# Patient Record
Sex: Female | Born: 1965 | Race: White | Hispanic: No | Smoking: Former smoker
Health system: Southern US, Community
[De-identification: ages and names within clinical notes are randomized; demographics above are authoritative.]

## PROBLEM LIST (undated history)

## (undated) DIAGNOSIS — N200 Calculus of kidney: Secondary | ICD-10-CM

## (undated) DIAGNOSIS — L409 Psoriasis, unspecified: Secondary | ICD-10-CM

## (undated) DIAGNOSIS — H539 Unspecified visual disturbance: Secondary | ICD-10-CM

## (undated) DIAGNOSIS — J302 Other seasonal allergic rhinitis: Secondary | ICD-10-CM

## (undated) DIAGNOSIS — K469 Unspecified abdominal hernia without obstruction or gangrene: Secondary | ICD-10-CM

## (undated) DIAGNOSIS — O019 Hydatidiform mole, unspecified: Secondary | ICD-10-CM

## (undated) DIAGNOSIS — K224 Dyskinesia of esophagus: Secondary | ICD-10-CM

## (undated) DIAGNOSIS — D649 Anemia, unspecified: Secondary | ICD-10-CM

## (undated) DIAGNOSIS — Z9889 Other specified postprocedural states: Secondary | ICD-10-CM

## (undated) DIAGNOSIS — Z8719 Personal history of other diseases of the digestive system: Secondary | ICD-10-CM

## (undated) DIAGNOSIS — I10 Essential (primary) hypertension: Secondary | ICD-10-CM

## (undated) DIAGNOSIS — M1990 Unspecified osteoarthritis, unspecified site: Secondary | ICD-10-CM

## (undated) DIAGNOSIS — Z5189 Encounter for other specified aftercare: Secondary | ICD-10-CM

## (undated) DIAGNOSIS — IMO0001 Reserved for inherently not codable concepts without codable children: Secondary | ICD-10-CM

## (undated) DIAGNOSIS — G629 Polyneuropathy, unspecified: Secondary | ICD-10-CM

## (undated) DIAGNOSIS — K5909 Other constipation: Secondary | ICD-10-CM

## (undated) DIAGNOSIS — F329 Major depressive disorder, single episode, unspecified: Secondary | ICD-10-CM

## (undated) DIAGNOSIS — F410 Panic disorder [episodic paroxysmal anxiety] without agoraphobia: Secondary | ICD-10-CM

## (undated) DIAGNOSIS — S0990XA Unspecified injury of head, initial encounter: Secondary | ICD-10-CM

## (undated) DIAGNOSIS — Q249 Congenital malformation of heart, unspecified: Secondary | ICD-10-CM

## (undated) DIAGNOSIS — Z9289 Personal history of other medical treatment: Secondary | ICD-10-CM

## (undated) DIAGNOSIS — R05 Cough: Secondary | ICD-10-CM

## (undated) DIAGNOSIS — R112 Nausea with vomiting, unspecified: Secondary | ICD-10-CM

## (undated) DIAGNOSIS — E669 Obesity, unspecified: Secondary | ICD-10-CM

## (undated) DIAGNOSIS — K219 Gastro-esophageal reflux disease without esophagitis: Secondary | ICD-10-CM

## (undated) DIAGNOSIS — E785 Hyperlipidemia, unspecified: Secondary | ICD-10-CM

## (undated) HISTORY — DX: Congenital malformation of heart, unspecified: Q24.9

## (undated) HISTORY — DX: Other seasonal allergic rhinitis: J30.2

## (undated) HISTORY — DX: Other constipation: K59.09

## (undated) HISTORY — DX: Unspecified visual disturbance: H53.9

## (undated) HISTORY — DX: Obesity, unspecified: E66.9

## (undated) HISTORY — DX: Essential (primary) hypertension: I10

## (undated) HISTORY — PX: OTHER SURGICAL HISTORY: SHX169

## (undated) HISTORY — DX: Panic disorder (episodic paroxysmal anxiety): F41.0

## (undated) HISTORY — PX: PATENT DUCTUS ARTERIOUS REPAIR: SHX269

## (undated) HISTORY — DX: Hydatidiform mole, unspecified: O01.9

## (undated) HISTORY — PX: ELBOW FRACTURE SURGERY: SHX616

## (undated) HISTORY — DX: Gastro-esophageal reflux disease without esophagitis: K21.9

## (undated) HISTORY — DX: Unspecified abdominal hernia without obstruction or gangrene: K46.9

## (undated) HISTORY — DX: Calculus of kidney: N20.0

## (undated) HISTORY — DX: Encounter for other specified aftercare: Z51.89

## (undated) HISTORY — DX: Other specified postprocedural states: Z98.890

## (undated) HISTORY — DX: Unspecified injury of head, initial encounter: S09.90XA

## (undated) HISTORY — DX: Hyperlipidemia, unspecified: E78.5

## (undated) HISTORY — PX: SHOULDER ARTHROSCOPY: SHX128

## (undated) HISTORY — DX: Personal history of other diseases of the digestive system: Z87.19

---

## 2000-10-19 HISTORY — PX: NEPHRECTOMY: SHX65

## 2006-01-06 ENCOUNTER — Ambulatory Visit (HOSPITAL_COMMUNITY): Admission: RE | Admit: 2006-01-06 | Discharge: 2006-01-06 | Payer: Self-pay | Admitting: General Surgery

## 2006-01-12 ENCOUNTER — Encounter: Admission: RE | Admit: 2006-01-12 | Discharge: 2006-03-09 | Payer: Self-pay | Admitting: General Surgery

## 2006-02-16 HISTORY — PX: LAPAROSCOPIC GASTRIC BANDING: SHX1100

## 2006-03-01 ENCOUNTER — Encounter (INDEPENDENT_AMBULATORY_CARE_PROVIDER_SITE_OTHER): Payer: Self-pay | Admitting: *Deleted

## 2006-03-01 ENCOUNTER — Ambulatory Visit (HOSPITAL_COMMUNITY): Admission: RE | Admit: 2006-03-01 | Discharge: 2006-03-02 | Payer: Self-pay | Admitting: General Surgery

## 2006-04-13 ENCOUNTER — Encounter: Admission: RE | Admit: 2006-04-13 | Discharge: 2006-07-12 | Payer: Self-pay | Admitting: General Surgery

## 2006-08-02 ENCOUNTER — Encounter: Admission: RE | Admit: 2006-08-02 | Discharge: 2006-10-31 | Payer: Self-pay | Admitting: General Surgery

## 2007-02-12 ENCOUNTER — Emergency Department (HOSPITAL_COMMUNITY): Admission: EM | Admit: 2007-02-12 | Discharge: 2007-02-12 | Payer: Self-pay | Admitting: *Deleted

## 2007-06-04 ENCOUNTER — Emergency Department (HOSPITAL_COMMUNITY): Admission: EM | Admit: 2007-06-04 | Discharge: 2007-06-04 | Payer: Self-pay | Admitting: Emergency Medicine

## 2007-06-16 ENCOUNTER — Encounter: Admission: RE | Admit: 2007-06-16 | Discharge: 2007-06-16 | Payer: Self-pay | Admitting: General Surgery

## 2007-07-29 ENCOUNTER — Ambulatory Visit: Payer: Self-pay | Admitting: Pulmonary Disease

## 2007-08-12 ENCOUNTER — Ambulatory Visit: Admission: RE | Admit: 2007-08-12 | Discharge: 2007-08-12 | Payer: Self-pay | Admitting: Pulmonary Disease

## 2007-08-12 ENCOUNTER — Ambulatory Visit: Payer: Self-pay | Admitting: Pulmonary Disease

## 2007-08-21 ENCOUNTER — Ambulatory Visit: Payer: Self-pay | Admitting: Pulmonary Disease

## 2007-08-21 ENCOUNTER — Ambulatory Visit (HOSPITAL_BASED_OUTPATIENT_CLINIC_OR_DEPARTMENT_OTHER): Admission: RE | Admit: 2007-08-21 | Discharge: 2007-08-21 | Payer: Self-pay | Admitting: Pulmonary Disease

## 2007-08-25 ENCOUNTER — Ambulatory Visit: Payer: Self-pay | Admitting: Pulmonary Disease

## 2007-08-25 ENCOUNTER — Encounter: Admission: RE | Admit: 2007-08-25 | Discharge: 2007-08-25 | Payer: Self-pay | Admitting: General Surgery

## 2007-10-04 DIAGNOSIS — E1159 Type 2 diabetes mellitus with other circulatory complications: Secondary | ICD-10-CM | POA: Insufficient documentation

## 2007-10-04 DIAGNOSIS — E119 Type 2 diabetes mellitus without complications: Secondary | ICD-10-CM

## 2007-10-04 DIAGNOSIS — O019 Hydatidiform mole, unspecified: Secondary | ICD-10-CM | POA: Insufficient documentation

## 2007-10-04 DIAGNOSIS — I10 Essential (primary) hypertension: Secondary | ICD-10-CM

## 2007-10-04 DIAGNOSIS — E11621 Type 2 diabetes mellitus with foot ulcer: Secondary | ICD-10-CM | POA: Insufficient documentation

## 2007-10-04 DIAGNOSIS — R059 Cough, unspecified: Secondary | ICD-10-CM | POA: Insufficient documentation

## 2007-10-04 DIAGNOSIS — Z905 Acquired absence of kidney: Secondary | ICD-10-CM | POA: Insufficient documentation

## 2007-10-04 DIAGNOSIS — R05 Cough: Secondary | ICD-10-CM

## 2007-10-04 DIAGNOSIS — I152 Hypertension secondary to endocrine disorders: Secondary | ICD-10-CM | POA: Insufficient documentation

## 2007-10-04 HISTORY — DX: Type 2 diabetes mellitus with foot ulcer: E11.621

## 2007-10-31 ENCOUNTER — Ambulatory Visit: Payer: Self-pay | Admitting: Pulmonary Disease

## 2007-10-31 DIAGNOSIS — E669 Obesity, unspecified: Secondary | ICD-10-CM | POA: Insufficient documentation

## 2007-10-31 DIAGNOSIS — K219 Gastro-esophageal reflux disease without esophagitis: Secondary | ICD-10-CM | POA: Insufficient documentation

## 2007-10-31 DIAGNOSIS — J45991 Cough variant asthma: Secondary | ICD-10-CM | POA: Insufficient documentation

## 2007-10-31 DIAGNOSIS — G4733 Obstructive sleep apnea (adult) (pediatric): Secondary | ICD-10-CM | POA: Insufficient documentation

## 2007-10-31 DIAGNOSIS — J310 Chronic rhinitis: Secondary | ICD-10-CM | POA: Insufficient documentation

## 2007-10-31 HISTORY — DX: Obstructive sleep apnea (adult) (pediatric): G47.33

## 2007-12-15 ENCOUNTER — Encounter: Admission: RE | Admit: 2007-12-15 | Discharge: 2007-12-15 | Payer: Self-pay | Admitting: Obstetrics and Gynecology

## 2008-01-25 ENCOUNTER — Emergency Department (HOSPITAL_COMMUNITY): Admission: EM | Admit: 2008-01-25 | Discharge: 2008-01-25 | Payer: Self-pay | Admitting: Emergency Medicine

## 2008-03-19 ENCOUNTER — Encounter: Admission: RE | Admit: 2008-03-19 | Discharge: 2008-03-19 | Payer: Self-pay | Admitting: General Surgery

## 2008-03-30 ENCOUNTER — Inpatient Hospital Stay (HOSPITAL_COMMUNITY): Admission: AD | Admit: 2008-03-30 | Discharge: 2008-04-02 | Payer: Self-pay | Admitting: Internal Medicine

## 2009-05-29 ENCOUNTER — Encounter: Admission: RE | Admit: 2009-05-29 | Discharge: 2009-05-29 | Payer: Self-pay | Admitting: Internal Medicine

## 2009-12-26 ENCOUNTER — Encounter: Admission: RE | Admit: 2009-12-26 | Discharge: 2009-12-26 | Payer: Self-pay | Admitting: Internal Medicine

## 2010-03-23 ENCOUNTER — Ambulatory Visit: Payer: Self-pay | Admitting: Family Medicine

## 2010-03-23 DIAGNOSIS — K5909 Other constipation: Secondary | ICD-10-CM | POA: Insufficient documentation

## 2010-06-18 ENCOUNTER — Encounter: Admission: RE | Admit: 2010-06-18 | Discharge: 2010-06-18 | Payer: Self-pay | Admitting: Internal Medicine

## 2010-09-12 ENCOUNTER — Encounter: Payer: Self-pay | Admitting: Pulmonary Disease

## 2010-09-18 ENCOUNTER — Encounter: Payer: Self-pay | Admitting: Pulmonary Disease

## 2010-09-22 ENCOUNTER — Encounter: Payer: Self-pay | Admitting: Pulmonary Disease

## 2010-09-26 ENCOUNTER — Encounter: Payer: Self-pay | Admitting: Pulmonary Disease

## 2010-09-29 ENCOUNTER — Encounter: Payer: Self-pay | Admitting: Pulmonary Disease

## 2010-10-02 ENCOUNTER — Encounter: Payer: Self-pay | Admitting: Pulmonary Disease

## 2010-10-23 ENCOUNTER — Telehealth: Payer: Self-pay | Admitting: Internal Medicine

## 2010-10-23 ENCOUNTER — Ambulatory Visit
Admission: RE | Admit: 2010-10-23 | Discharge: 2010-10-23 | Payer: Self-pay | Source: Home / Self Care | Attending: Pulmonary Disease | Admitting: Pulmonary Disease

## 2010-10-23 ENCOUNTER — Encounter: Payer: Self-pay | Admitting: Pulmonary Disease

## 2010-10-23 ENCOUNTER — Other Ambulatory Visit: Payer: Self-pay | Admitting: Pulmonary Disease

## 2010-10-23 DIAGNOSIS — I712 Thoracic aortic aneurysm, without rupture, unspecified: Secondary | ICD-10-CM | POA: Insufficient documentation

## 2010-10-23 LAB — CBC WITH DIFFERENTIAL/PLATELET
Basophils Absolute: 0 10*3/uL (ref 0.0–0.1)
Basophils Relative: 0.4 % (ref 0.0–3.0)
Eosinophils Absolute: 0.1 10*3/uL (ref 0.0–0.7)
Eosinophils Relative: 0.7 % (ref 0.0–5.0)
HCT: 34.5 % — ABNORMAL LOW (ref 36.0–46.0)
Hemoglobin: 11.7 g/dL — ABNORMAL LOW (ref 12.0–15.0)
Lymphocytes Relative: 29.6 % (ref 12.0–46.0)
Lymphs Abs: 2 10*3/uL (ref 0.7–4.0)
MCHC: 33.9 g/dL (ref 30.0–36.0)
MCV: 81.1 fl (ref 78.0–100.0)
Monocytes Absolute: 0.4 10*3/uL (ref 0.1–1.0)
Monocytes Relative: 5.6 % (ref 3.0–12.0)
Neutro Abs: 4.4 10*3/uL (ref 1.4–7.7)
Neutrophils Relative %: 63.7 % (ref 43.0–77.0)
Platelets: 322 10*3/uL (ref 150.0–400.0)
RBC: 4.26 Mil/uL (ref 3.87–5.11)
RDW: 15.3 % — ABNORMAL HIGH (ref 11.5–14.6)
WBC: 6.8 10*3/uL (ref 4.5–10.5)

## 2010-10-23 LAB — HEPATIC FUNCTION PANEL
ALT: 18 U/L (ref 0–35)
AST: 24 U/L (ref 0–37)
Albumin: 3.7 g/dL (ref 3.5–5.2)
Alkaline Phosphatase: 76 U/L (ref 39–117)
Bilirubin, Direct: 0.1 mg/dL (ref 0.0–0.3)
Total Bilirubin: 0.5 mg/dL (ref 0.3–1.2)
Total Protein: 7.1 g/dL (ref 6.0–8.3)

## 2010-10-23 LAB — BASIC METABOLIC PANEL
BUN: 13 mg/dL (ref 6–23)
CO2: 30 mEq/L (ref 19–32)
Calcium: 9.2 mg/dL (ref 8.4–10.5)
Chloride: 100 mEq/L (ref 96–112)
Creatinine, Ser: 0.8 mg/dL (ref 0.4–1.2)
GFR: 85.68 mL/min (ref >60.00)
Glucose, Bld: 89 mg/dL (ref 70–99)
Potassium: 3.8 mEq/L (ref 3.5–5.1)
Sodium: 138 mEq/L (ref 135–145)

## 2010-10-23 LAB — CONVERTED CEMR LAB
A-1 Antitrypsin, Ser: 175 mg/dL (ref 83–200)
IgE (Immunoglobulin E), Serum: <1.5 intl units/mL (ref 0.0–180.0)

## 2010-10-23 LAB — SEDIMENTATION RATE: Sed Rate: 29 mm/hr — ABNORMAL HIGH (ref 0–22)

## 2010-10-23 LAB — TSH: TSH: 1 u[IU]/mL (ref 0.35–5.50)

## 2010-10-23 LAB — BRAIN NATRIURETIC PEPTIDE: Pro B Natriuretic peptide (BNP): 9.9 pg/mL (ref 0.0–100.0)

## 2010-10-24 ENCOUNTER — Ambulatory Visit: Payer: Self-pay | Admitting: Internal Medicine

## 2010-10-24 ENCOUNTER — Telehealth (INDEPENDENT_AMBULATORY_CARE_PROVIDER_SITE_OTHER): Payer: Self-pay | Admitting: *Deleted

## 2010-11-03 ENCOUNTER — Ambulatory Visit: Admit: 2010-11-03 | Payer: Self-pay | Admitting: Pulmonary Disease

## 2010-11-07 ENCOUNTER — Telehealth (INDEPENDENT_AMBULATORY_CARE_PROVIDER_SITE_OTHER): Payer: Self-pay | Admitting: *Deleted

## 2010-11-07 ENCOUNTER — Encounter: Payer: Self-pay | Admitting: Pulmonary Disease

## 2010-11-20 NOTE — Letter (Signed)
Summary: [facility]  [address]   [place], [place] [address]   Phone: [phone]  Fax: [phone]      11/07/2010  RE:  NOHEA KRAS, Claim # [id number]   TO WHOM IT MAY CONCERN:  Please excuse the above named patient from work on 10/24/2010 due to scheduled testing requested by Dr. Craige Cotta.  If you have any questions, please call the office at [phone].    Sincerely,     Coralyn Helling, M.D.

## 2010-11-20 NOTE — Progress Notes (Signed)
Summary: CT -- VS pt  Phone Note Other Incoming   Caller: Rose CT  Summary of Call: Rose with CT calling.  Requesting dr look at pt's CT and give instructions on what to do.  Advised VS is out of the office but will have doc of the day look at this and will call her back with his recs.  Rose call back- ext [phone]  Dr. Delford Field, pls advise.  Thanks! Initial call taken by: Gweneth Dimitri RN,  October 24, 2010 2:51 PM  Follow-up for Phone Call        Per Dr. Delford Field, rose may let pt go.   Rose is aware of this.  Gweneth Dimitri RN  October 24, 2010 2:53 PM   Additional Follow-up for Phone Call Additional follow up Details #1::        yes let sood review as outpt Additional Follow-up by: Storm Frisk MD,  October 24, 2010 2:54 PM    Additional Follow-up for Phone Call Additional follow up Details #2::    CT reviewed.  Willl contact patient. Follow-up by: Coralyn Helling MD,  October 24, 2010 3:01 PM

## 2010-11-20 NOTE — Letter (Signed)
Summary: [facility]  [facility]   Imported By: Sherian Rein 11/12/2010 10:10:38  _____________________________________________________________________  External Attachment:    Type:   Image     Comment:   External Document

## 2010-11-20 NOTE — Assessment & Plan Note (Signed)
Summary: BRONCHITIS/EVM   Vital Signs:  Patient Profile:   45 Years Old Female CC:      ? bronchitis / uri Height:     66 inches Weight:      229 pounds BMI:     37.10 BSA:     2.12 O2 Sat:      98 % O2 treatment:    Room Air Temp:     98.6 degrees F oral Pulse rate:   92 / minute Pulse rhythm:   regular Resp:     20 per minute BP sitting:   160 / 94  (right arm) Cuff size:   large  Pt. in pain?   no  Vitals Entered By: Providence Crosby LPN (March 23, 1190 11:35 AM)              Is Patient Diabetic? Yes  Nutritional Status BMI of > 30 = obese Comments at 12 noon gave patient .1mg  of clonidine by mouth //at 12:30 bp 156/76 pulse 96 wanda combs lpn YN82:95 pm bp 146/70 pulse 96 skin warm and dry. wanda combs lpn     Serial Vital Signs/Assessments:  Time      Position  BP       Pulse  Resp  Temp     By 1230 p              160/90   92                    Wanda Combs LPN 6213 P              147/76   90     15    98.2  F  Tiwanda Threats MD 1135                160/90                         Tymia Streb MD                                PEF    PreRx  PostRx Time      O2 Sat  O2 Type     L/min  L/min  L/min   By 1245 P    99  %   Room air                          Todd Argabright MD this blood pressure was done at 11:33 when she first came in' Wanda combs lpn    Current Allergies (reviewed today): ! PCN ! SULFAHistory of Present Illness Referral source: self History from: patient Reason for visit: see chief complaint Chief Complaint: ?bronchitis per patient History of Present Illness: started out as cold symptoms about a week to 10 days ago, pt starting with wheezing in the last 4 days, productive cough, pt didn't take blood pressure meds this am.  Pt says she has chronic constipation.  With the new illness, pt reports that she has had constant cough, occ sob, allergy symptoms, no CP, no fever, no rash, pt had been drinking a lot of fluids at her home with her son but she  reports that she has not been able to stop the coughing.   Pt reports that she otherwise has been well but somewhat worried about the cough, headaches  and wheezing. She says that she tends to get these bronchitis infections every spring/summer season.   Current Meds MULTIVITAMINS   TABS (MULTIPLE VITAMIN) 1 by mouth daily VIACTIV 500-100-40  CHEW (CALCIUM-VITAMIN D-VITAMIN K) Take 1 tablet by mouth once a day SYMBICORT 160-4.5 MCG/ACT  AERO (BUDESONIDE-FORMOTEROL FUMARATE) 2 puffs two times a day VERAPAMIL HCL CR 240 MG  TBCR (VERAPAMIL HCL) Take 1 tablet by mouth once a day SINGULAIR 10 MG  TABS (MONTELUKAST SODIUM) Take 1 tablet by mouth once a day AMITRIPTYLINE HCL 25 MG  TABS (AMITRIPTYLINE HCL) Take 1 tablet by mouth once a day LEVSIN/SL 0.125 MG SUBL (HYOSCYAMINE SULFATE) as needed subliquinal MIRALAX  POWD (POLYETHYLENE GLYCOL 3350) 1 cap every day by mouth DEXILANT 60 MG CPDR (DEXLANSOPRAZOLE) 1 daily by mouth DOXYCYCLINE HYCLATE 100 MG CAPS (DOXYCYCLINE HYCLATE) take 1 by mouth two times a day until completed VENTOLIN HFA 108 (90 BASE) MCG/ACT AERS (ALBUTEROL SULFATE) 2 puffs q 3 hours as needed wheezing, sob, severe cough  REVIEW OF SYSTEMS Constitutional Symptoms       Complains of fatigue.     Denies fever, chills, weight loss, and weight gain.  Eyes       Complains of eye pain and glasses.      Denies change in vision, eye discharge, contact lenses, and eye surgery. Ear/Nose/Throat/Mouth       Complains of dizziness, frequent runny nose, frequent nose bleeds, sinus problems, sore throat, and hoarseness.      Denies hearing loss/aids, change in hearing, ear pain, ear discharge, and tooth pain or bleeding.  Respiratory       Complains of dry cough, productive cough, wheezing, shortness of breath, asthma, and bronchitis.      Comments: c/o yellow phlegm Cardiovascular       Denies murmurs, chest pain, and tires easily with exhertion.    Gastrointestinal       Complains of  stomach pain and constipation.      Denies nausea/vomiting, diarrhea, blood in bowel movements, and indigestion.      Comments: has hernia Genitourniary       Denies painful urination, blood or discharge from vagina, kidney stones, and loss of urinary control. Neurological       Complains of headaches.      Denies loss of or changes in sensation, numbness, tngling, paralysis, seizures, and fainting/blackouts. Musculoskeletal       Denies muscle pain, joint pain, joint stiffness, decreased range of motion, redness, swelling, muscle weakness, and gout.  Skin       Denies bruising, unusual mles/lumps or sores, and hair/skin or nail changes.  Psych       Denies mood changes, temper/anger issues, anxiety/stress, speech problems, depression, and sleep problems.  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 Constipation- Chronic Nephrolithiasis s/p Lap Band procedure for Obesity 2007  Family History: Father: age 44 elevated blood pressure Mother: 2 years of age  diabetes high blood pressure  Siblings: 1 brother deceased at  age 68 1 sister age 23 elevated bp; heart disease  Social History: Marital Status: Married/ getting divorce Children: 3 / 1girl and 2 boys Occupation: [employer] open heart surgery in 1967 left kidney removed 2002 Physical Exam General appearance: well developed, well nourished, no acute distress Head: normocephalic,  atraumatic Eyes: conjunctivae and lids normal Pupils: equal, round, reactive to light Ears: normal, no lesions or deformities Nasal: mucosa pink, nonedematous, no septal deviation, turbinates normal Oral/Pharynx: tongue normal, posterior pharynx without erythema or exudate Neck: neck supple,  trachea midline, no masses Chest/Lungs: scattered wheezes throughout all lobes, rare rhonchi heard at both bases, good improvement after 2 neb treatments, wheezing nearly completely resolved Heart: regular rate and  rhythm, no murmur Abdomen: soft, non-tender without obvious organomegaly Extremities: normal extremities except no  Neurological: grossly intact and non-focal Skin: no obvious rashes or lesions MSE: oriented to time, place, and person Assessment Problems:   GERD (ICD-530.81) OBESITY, UNSPECIFIED (ICD-278.00) OBSTRUCTIVE SLEEP APNEA (ICD-327.23) CHRONIC RHINITIS (ICD-472.0) 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 The patient reports that she is doing better with this. She reports that on a regular basis she has difficulty with changes of the season that require her to be treated for bronchitis. She said that she had walking pneumonia with her last bronchitis illness.  Patient Education: Patient and/or caregiver instructed in the following: rest, fluids, exercise, weight loss. the patient was given  instructions to check with her primary care provider regarding a nebulizer machine for home use. Demonstrates willingness to comply.  Plan New Medications/Changes: VENTOLIN HFA 108 (90 BASE) MCG/ACT AERS (ALBUTEROL SULFATE) 2 puffs q 3 hours as needed wheezing, sob, severe cough  #1 x 0, 03/23/2010, Issaih Kaus MD DOXYCYCLINE HYCLATE 100 MG CAPS (DOXYCYCLINE HYCLATE) take 1 by mouth two times a day until completed  #14 x 0, 03/23/2010, Standley Dakins MD  New Orders: Pulse Oximetry [94760] Nebulizer Tx [id number] New Patient Level IV [99204] Solumedrol up to 125mg  [J2930] Prescription Created Electronically [phone] Follow Up: Follow up in 1-2 days if no improvement, Follow up with Primary Physician Follow Up: with primary care physician Dr. Wylene Simmer  The patient and/or caregiver has been counseled thoroughly with regard to medications prescribed including dosage, schedule, interactions, rationale for use, and possible side effects and they verbalize understanding.  Diagnoses and expected course of recovery discussed and will return if not improved as expected or if the condition worsens. Patient and/or caregiver verbalized understanding.  Prescriptions: VENTOLIN HFA 108 (90 BASE) MCG/ACT AERS (ALBUTEROL SULFATE) 2 puffs q 3 hours as needed wheezing, sob, severe cough  #1 x 0   Entered and Authorized by:   Standley Dakins MD   Signed by:   Standley Dakins MD on 03/23/2010   Method used:   [facility] to        [facility]  [address] (retail)       [address]       [place], [place]  [address]       Ph: [phone] or [phone]       Fax: [phone]   RxID:  [id number] DOXYCYCLINE HYCLATE 100 MG CAPS (DOXYCYCLINE HYCLATE) take 1 by mouth two times a day until completed  #14 x 0   Entered and Authorized by:   Standley Dakins MD   Signed by:   Standley Dakins MD on 03/23/2010   Method used:   [facility]        [facility]  [address]  (retail)       [address]       [place], [place]  [address]       Ph: [phone] or [phone]       Fax: [phone]   RxID:   [id number]   Patient Instructions: 1)  Follow up with your primary care physician about getting a home nebulizer.  2)  Go home and take your blood pressure medications. 3)  Check your blood pressure often and go see your primary care physician to have your blood pressure checked in the next week.  4)  The patient was informed that there is no on-call provider or services available at this clinic during off-hours (when the clinic is closed).  If the patient developed a problem or concern that required immediate attention, the patient was advised to go the the nearest available urgent care or emergency department for medical care.  The patient verbalized understanding.    5)  The patient's prescriptions were checked for possible interactions and electronically sent to the pharmacy of choice.   6)  The risks, benefits and possible side effects were clearly explained and discussed with the patient.  The patient verbalized clear understanding.  The patient was given instructions to return if symptoms don't improve, worsen or new changes develop.  If it is not during clinic hours and the patient cannot get back to this clinic then the patient was told to seek medical care at an available urgent care or emergency department.  The patient verbalized understanding.    Medication Administration  Injection # 1:    Medication: Solumedrol up to 125mg     Diagnosis: BRONCHITIS, ACUTE WITH BRONCHOSPASM (ICD-466.0)    Route: IM    Site: RUOQ gluteus    Exp Date: 10/2012    Lot #: [id number]    Mfr: pfizer    Given by: Providence Crosby LPN (March 23, 9562 12:44 PM)  Orders Added: 1)  Pulse Oximetry [94760] 2)  Nebulizer Tx [94640] 3)  New Patient Level IV [99204] 4)  Solumedrol up to 125mg  [J2930] 5)  Prescription Created Electronically [id number]   Medication  Administration  Injection # 1:    Medication: Solumedrol up to 125mg     Diagnosis: BRONCHITIS, ACUTE WITH BRONCHOSPASM (ICD-466.0)    Route: IM    Site: RUOQ gluteus    Exp Date: 10/2012    Lot #: [id number]    Mfr: pfizer    Given by: Providence Crosby LPN (March 23, 3328 12:44 PM)  Orders Added: 1)  Pulse Oximetry [94760] 2)  Nebulizer Tx [94640] 3)  New Patient Level IV [99204] 4)  Solumedrol up to 125mg  [J2930] 5)  Prescription Created Electronically [id number]    Vital Signs:  Patient Profile:   45 Years Old Female CC:      ?bronchitis per patient Height:     66 inches Weight:      229 pounds BMI:     37.10 BSA:     2.12 O2 Sat:      98 % Temp:     98.6 degrees F oral Pulse rate:  92 / minute Pulse rhythm:   regular Resp:     20 per minute BP sitting:   160 / 94 Cuff size:   large  Vitals Entered By: Providence Crosby LPN (March 23, 6212 12:46 PM)  The patient was informed that there is no on-call provider or services available at this clinic during off-hours (when the clinic is closed).  If the patient developed a problem or concern that required immediate attention, the patient was advised to go the the nearest available urgent care or emergency department for medical care.  The patient verbalized understanding.     It was clearly explained to the patient that this [facility] is not intended to be a primary care clinic.  The patient is always better served by the continuity of care and the provider/patient relationships developed with their dedicated primary care provider.  The patient was told to be sure to follow up as soon as possible with their primary care provider to discuss treatments received and to receive further examination and testing.  The patient verbalized understanding. The will f/u with PCP ASAP.   The patient's prescriptions were checked for possible interactions and electronically sent to the pharmacy of choice.    The risks, benefits and possible side effects  were clearly explained and discussed with the patient.  The patient verbalized clear understanding.  The patient was given instructions to return if symptoms don't improve, worsen or new changes develop.  If it is not during clinic hours and the patient cannot get back to this clinic then the patient was told to seek medical care at an available urgent care or emergency department.  The patient verbalized understanding.

## 2010-11-20 NOTE — Progress Notes (Signed)
Summary: out of work note  Phone Note Call from Patient Call back at [employer] [phone]   Caller: Patient Call For: sood Reason for Call: Talk to Nurse Summary of Call: Patient saw Dr. Craige Cotta 10/23/10 and was scheduled for a CT 10/24/10.  FMLA is denying CT on 10/24/10, so patient needs a letter stating that the CT was needed.  Please fax letter to [phone] attn:Patricia Learned  Claim# [id number] Initial call taken by: Lehman Prom,  November 07, 2010 11:46 AM  Follow-up for Phone Call        [facility] Vernie Murders  November 07, 2010 2:37 PM Patient phoned returning Humphrey call she gets off at 4:30 and will try to call back then. Vedia Coffer  November 07, 2010 3:30 PM  Letter faxed for patient to number provided.  Pt is aware.           Michel Bickers CMA  November 07, 2010 4:42 PM

## 2010-11-20 NOTE — Assessment & Plan Note (Signed)
Summary: wheezing///kp   Copy to:  Dr. Guerry Bruin Primary Provider/Referring Provider:  Dr. Colonel Bald  CC:  Asthma evaluation...patient c/o incr SOB and wheezing and dry cough since November 2011.Dominique Evans  History of Present Illness: 45 yo female for evaluation of cough.  I had previously seen [name] in 2008 and 2009 for her cough.  At that time this was felt to be related to rhinitis with post-nasal drip, asthma, and reflux.  She does have an extensive prior history of smoking, but PFT from 2008 did not show evidence for airflow obstruction.  She has since been followed by Dr. Wylene Simmer and Dr. Willa Rough.  She has suffered from asthma since she was very young.  She has been told by her allergist that she has several allergies.  She has been noticing more trouble catching her breath, and she has been wheezing more.  Her wheezing is coming mostly from her chest, but some in her throat.  She has been getting a tight feeling in her chest.  She gets hoarse, and feels like something is stuck in her throat.  Her cough is usually dry.  She denies hemoptysis.  She has been using singulair for years.  She was using advair before, but this stopped working.  She tried symbicort, but this caused chest discomfort.  She was switched to Qvar, and this seems to help some.  She has nose sprays, but does not use these on a regular basis.  She developed a respiratory infection in November.  She was given an antibiotic and prednisone.  Her symptoms persisted.  She was given a depmedrol shot, and nebulizer treatment.  She was then given an additional course of antibiotics and prednisone.  She had a third round of prednisone after this, and finally felt like she was getting some improvement.  She then went back to work, and her symptoms recurred.  She has been using dexliant for GERD.  She notices trouble with her breathing when she lays flat.  She feels like her throat is closing.  As a result she has to sleep in  a chair.  Chest xray report from Sep 12, 2010 was unremarkable.  Preventive Screening-Counseling & Management  Alcohol-Tobacco     Alcohol drinks/day: 0     Smoking Status: quit     Packs/Day: 1.0     Year Started: 1982     Year Quit: 1984     Pack years: 2  Current Medications (verified): 1)  Qvar 80 Mcg/act Aers (Beclomethasone Dipropionate) .... 2 By Mouth Once Daily 2)  Verapamil Hcl Cr 240 Mg  Tbcr (Verapamil Hcl) .... Take 1 Tablet By Mouth Once A Day 3)  Singulair 10 Mg  Tabs (Montelukast Sodium) .... Take 1 Tablet By Mouth Once A Day 4)  Amitriptyline Hcl 25 Mg  Tabs (Amitriptyline Hcl) .... Take 1 Tablet By Mouth Once A Day 5)  Miralax  Powd (Polyethylene Glycol 3350) .Dominique Evans.. 1 Cap Every Day By Mouth 6)  Dexilant 60 Mg Cpdr (Dexlansoprazole) .Dominique Evans.. 1 Daily By Mouth 7)  Pristiq 50 Mg Xr24h-Tab (Desvenlafaxine Succinate) .Dominique Evans.. 1 By Mouth Daily 8)  Crestor 10 Mg Tabs (Rosuvastatin Calcium) .Dominique Evans.. 1 By Mouth Daily 9)  Furosemide 20 Mg Tabs (Furosemide) .Dominique Evans.. 1 By Mouth Two Times A Day 10)  Proair Hfa 108 (90 Base) Mcg/act Aers (Albuterol Sulfate) .... 2 Puffs As Needed  Allergies (verified): 1)  ! Pcn 2)  ! 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  Past Surgical History: left nephrectomy in 2002 Left elbow fracture. Arthroscopic surgery on her right shoulder. Gastric band placement with truncal vagotomy in May of 2007.  Family History: Father: age 86 elevated blood pressure Mother: 61 years of age  diabetes high blood pressure  Siblings: 1 brother deceased at age 65 1 sister age 69  elevated bp; heart disease Family History Breast Cancer---aunt skin cancer---mother and father  Social History: Getting divorced.  Four children.  Works with [employer].  Smoked 2 packs per day, and quit in 1984.Alcohol drinks/day:  0 Packs/Day:  1.0 Pack years:  2  Review of Systems       The patient complains of shortness of breath with activity, shortness of breath at rest, non-productive cough, chest pain, acid heartburn, indigestion, loss of appetite, weight change, difficulty swallowing, headaches, depression, hand/feet swelling, joint stiffness or pain, and rash.  The patient denies productive cough, coughing up blood, irregular heartbeats, abdominal pain, sore throat, tooth/dental problems, nasal congestion/difficulty breathing through nose, sneezing, itching, ear ache, anxiety, change in color of mucus, and fever.    Vital Signs:  Patient profile:   45 year old female Height:      66 inches (167.64 cm) Weight:      243.38 pounds (110.63 kg) BMI:     39.42 O2 Sat:      99 % on Room air Temp:     98.2 degrees F (36.78 degrees C) oral Pulse rate:   94 / minute BP sitting:   142 / 84  (left arm) Cuff size:   large  Vitals Entered By: Michel Bickers CMA (October 23, 2010 2:44 PM)  O2 Sat at Rest %:  99 O2 Flow:  Room air CC: Asthma evaluation...patient c/o incr SOB, wheezing and dry cough since November 2011. Comments Medications reviewed with patient Michel Bickers CMA  October 23, 2010 2:45 PM   Physical Exam  General:  normal appearance, healthy appearing, and obese.   Eyes:  PERRLA and EOMI.   Ears:  TMs intact and clear with normal canals Nose:  narrow nasal angles, clear drainage, no sinus tenderness Mouth:  MP 2, no exudate Neck:  no JVD, wheeze over throat Chest Wall:  no deformities noted Lungs:  clear bilaterally to auscultation and percussion Heart:  regular rate and rhythm, S1, S2 without murmurs, rubs, gallops, or clicks Abdomen:  bowel sounds positive; abdomen soft  and non-tender without masses, or organomegaly Msk:  no deformity or scoliosis noted with normal posture Pulses:  pulses normal Extremities:  no clubbing, cyanosis, edema, or deformity noted Neurologic:  CN II-XII grossly intact with normal reflexes, coordination, muscle strength and tone Skin:  changes of psoriasis Cervical Nodes:  no significant adenopathy Psych:  alert and cooperative; normal mood and affect; normal attention span and concentration   Impression & Recommendations:  Problem # 1:  COUGH (ICD-786.2) This is likely multifactorial related to rhinitis with post-nasal drip, asthma, and reflux.  She may also have a component of vocal cord dysfunction in the setting of panic attacks.  She does have an extensive prior history of smoking, but PFT from 2008 did not show obstruction.  For her  rhinitis I will start her on nasonex.   For her asthma, I will change her from Qvar to nebulized pulmicort.  She is to continue singulair. She is to continue dexilant for her GERD. Will then re-assess whether she may also need speech therapy evaluation for possible VCD.  In additional to these interventions I will arrange for a chest xray, labs, and pulmonary function testing.  Medications Added to Medication List This Visit: 1)  Qvar 80 Mcg/act Aers (Beclomethasone dipropionate) .... 2 by mouth once daily 2)  Budesonide 0.5 Mg/54ml Susp (Budesonide) .... One vial nebulized two times a day 3)  Proair Hfa 108 (90 Base) Mcg/act Aers (Albuterol sulfate) .... 2 puffs as needed 4)  Nasonex 50 Mcg/act Susp (Mometasone furoate) .... Two sprays once daily 5)  Furosemide 20 Mg Tabs (Furosemide) .Dominique Evans.. 1 by mouth two times a day 6)  Crestor 10 Mg Tabs (Rosuvastatin calcium) .Dominique Evans.. 1 by mouth daily 7)  Pristiq 50 Mg Xr24h-tab (Desvenlafaxine succinate) .Dominique Evans.. 1 by mouth daily  Complete Medication List: 1)  Budesonide 0.5 Mg/18ml Susp (Budesonide) .... One vial nebulized two times a day 2)  Singulair 10 Mg  Tabs (Montelukast sodium) .... Take 1 tablet by mouth once a day 3)  Proair Hfa 108 (90 Base) Mcg/act Aers (Albuterol sulfate) .... 2 puffs as needed 4)  Nasonex 50 Mcg/act Susp (Mometasone furoate) .... Two sprays once daily 5)  Furosemide 20 Mg Tabs (Furosemide) .Dominique Evans.. 1 by mouth two times a day 6)  Verapamil Hcl Cr 240 Mg Tbcr (Verapamil hcl) .... Take 1 tablet by mouth once a day 7)  Crestor 10 Mg Tabs (Rosuvastatin calcium) .Dominique Evans.. 1 by mouth daily 8)  Dexilant 60 Mg Cpdr (Dexlansoprazole) .Dominique Evans.. 1 daily by mouth 9)  Amitriptyline Hcl 25 Mg Tabs (Amitriptyline hcl) .... Take 1 tablet by mouth once a day 10)  Pristiq 50 Mg Xr24h-tab (Desvenlafaxine succinate) .Dominique Evans.. 1 by mouth daily 11)  Miralax Powd (Polyethylene glycol 3350) .Dominique Evans.. 1 cap every day by mouth  Other Orders: Consultation Level IV (99244) TLB-BMP (Basic Metabolic Panel-BMET) (80048-METABOL) TLB-CBC Platelet - w/Differential (85025-CBCD) TLB-Hepatic/Liver Function Pnl (80076-HEPATIC) TLB-TSH (Thyroid Stimulating Hormone) (84443-TSH) TLB-BNP (B-Natriuretic Peptide) (83880-BNPR) TLB-Sedimentation Rate (ESR) (85652-ESR) T-"RAST" (Allergy Full Profile) IGE (16109-60454) T-Alpha-1-Antitrypsin Tot (09811-91478) Full Pulmonary Function Test (PFT) T-2 View CXR (71020TC)  Patient Instructions: 1)  Nasonex two sprays once daily 2)  Stop Qvar 3)  Pulmicort one vial nebulized two times a day, and rinse mouth after using 4)  Chest xray today 5)  Lab tests today 6)  Will schedule breathing test (PFT) 7)  Follow up in 2 weeks Prescriptions: NASONEX 50 MCG/ACT SUSP (MOMETASONE FUROATE) two sprays once daily  #1 x 2   Entered and Authorized by:   Coralyn Helling MD   Signed by:   Coralyn Helling MD on 10/23/2010   Method used:   [facility] to        [facility]  [place] (retail)       [address]       [place], [place]  [address]       Ph: [phone] or [phone]       Fax: [phone]   RxID:   [id number] BUDESONIDE  0.5 MG/2ML SUSP (BUDESONIDE) one vial nebulized two times a day  #60 x 1   Entered and Authorized by:   Coralyn Helling MD   Signed by:   Coralyn Helling MD on 10/23/2010   Method used:   [facility]  [facility]  [place] (retail)       [address]       [place], [place]  [address]       Ph: [phone] or [phone]       Fax: [phone]   RxID:   [id number]

## 2010-11-20 NOTE — Progress Notes (Signed)
Summary: On call- Radiologist concerned about CXR.  Phone Note From Other Clinic   Summary of Call: On CAll- Radiologist Dr Dwyane Dee called with concern that descending aorta showed possible aneurysm. His form indicated complaint of chest pain. Preliminary OV note from today reviewed, without chest pain noted on my review. I called patient's home - patient out. I left message for patient to call answering service so I could discuss with her. I then gave message personally to Dr Craige Cotta who was still here and he is speaking to the patient.  Initial call taken by: Waymon Budge MD,  October 23, 2010 5:51 PM

## 2010-11-20 NOTE — Letter (Signed)
Summary: [facility]  [facility]   Imported By: Sherian Rein 11/12/2010 10:09:40  _____________________________________________________________________  External Attachment:    Type:   Image     Comment:   External Document

## 2010-11-20 NOTE — Progress Notes (Signed)
Summary: appointments-LMTCbx1  Phone Note Call from Patient   Caller: Patient Call For: dr Craige Cotta Summary of Call: Patient phoned she had ct today and they determined that her esophagus  is closing up and she wants to know if she still needs to do the breathing test on the 16th. Patient can be reached at [phone] She also has a follow up appointment on the 30th she wants to know if she needs to reschedule that since she knows that she can not make that appt. Initial call taken by: Vedia Coffer,  October 24, 2010 4:24 PM  Follow-up for Phone Call        Spoke with pt.  She states that she forgot to ask VS if still needs PFT's- she is sched to have this done on 11/03/10 and if he still wants her to have this done, she wished to resched to a later date.  Pls advise , thanks Follow-up by: Vernie Murders,  October 24, 2010 4:45 PM  Additional Follow-up for Phone Call Additional follow up Details #1::        Okay to cancel PFT.  Please have her reschedule her follow up appointment for a later date at her convenience. Additional Follow-up by: Coralyn Helling MD,  October 25, 2010 6:25 PM    Additional Follow-up for Phone Call Additional follow up Details #2::    LMTCBx1. Carron Curie CMA  October 27, 2010 8:56 AM  Pt returning call says it's ok to [facility].Darletta Moll  October 27, 2010 9:53 AM  Spoke with pt and advised of the above recs per VS.  Pt verbalized understanding and will call back when able to set up rov with VS.  Her PFT and rov were both cancelled. Follow-up by: Vernie Murders,  October 27, 2010 9:57 AM

## 2010-11-20 NOTE — Letter (Signed)
Summary: [facility]  [facility]   Imported By: Sherian Rein 11/12/2010 10:11:36  _____________________________________________________________________  External Attachment:    Type:   Image     Comment:   External Document

## 2010-12-16 ENCOUNTER — Telehealth: Payer: Self-pay | Admitting: Pulmonary Disease

## 2010-12-25 NOTE — Progress Notes (Signed)
Summary: budesonide  Phone Note Call from Patient   Caller: Patient Summary of Call: patient budesonide inhailation she had this filled thru her pharmacy but Monia Pouch has advised that we fax a 90 day supply to her mail order. Fax prescript for a 90 day supply to [phone] her  Monia Pouch ID is  [id number] they also need her address  [address] [address] [place] [address] pt can be reached at [phone] Initial call taken by: Vedia Coffer,  December 16, 2010 1:39 PM  Follow-up for Phone Call        [facility] Vernie Murders  December 16, 2010 3:12 PM  Called, spoke with pt.  States [facility] but for future fills insurance is requesting her use mail order.  But before this is sent, pt would like to know she needs to continue this medication considering all of her previous test results.  Pt has scheduled follow up for 01/08/11 at 4:30 with Dr. Craige Cotta.  Advised Dr. Craige Cotta out of office until tomorrow am -- pt ok with this.  Dr. Craige Cotta, pls advise.  Thanks! Follow-up by: Gweneth Dimitri RN,  December 16, 2010 5:00 PM  Additional Follow-up for Phone Call Additional follow up Details #1::        She may not need to continue with budesonide.  Please advise that she should continue for now, and will re-assess at [facility] on March 22. Additional Follow-up by: Coralyn Helling MD,  December 17, 2010 10:25 AM    Additional Follow-up for Phone Call Additional follow up Details #2::    LMTCBx1. Carron Curie CMA  December 17, 2010 10:54 AM  Pt informed of VS recs. Zackery Barefoot CMA  December 17, 2010 11:52 AM

## 2010-12-27 ENCOUNTER — Inpatient Hospital Stay (HOSPITAL_COMMUNITY)
Admission: EM | Admit: 2010-12-27 | Discharge: 2010-12-30 | DRG: 394 | Disposition: A | Discharge status: Home / Self Care | Payer: Managed Care, Other (non HMO) | Attending: Surgery | Admitting: Surgery

## 2010-12-27 ENCOUNTER — Emergency Department (HOSPITAL_COMMUNITY): Payer: Managed Care, Other (non HMO)

## 2010-12-27 DIAGNOSIS — F329 Major depressive disorder, single episode, unspecified: Secondary | ICD-10-CM | POA: Diagnosis present

## 2010-12-27 DIAGNOSIS — F3289 Other specified depressive episodes: Secondary | ICD-10-CM | POA: Diagnosis present

## 2010-12-27 DIAGNOSIS — IMO0002 Reserved for concepts with insufficient information to code with codable children: Secondary | ICD-10-CM | POA: Diagnosis present

## 2010-12-27 DIAGNOSIS — I10 Essential (primary) hypertension: Secondary | ICD-10-CM | POA: Diagnosis present

## 2010-12-27 DIAGNOSIS — K449 Diaphragmatic hernia without obstruction or gangrene: Secondary | ICD-10-CM | POA: Diagnosis present

## 2010-12-27 DIAGNOSIS — J45909 Unspecified asthma, uncomplicated: Secondary | ICD-10-CM | POA: Diagnosis present

## 2010-12-27 DIAGNOSIS — T18108A Unspecified foreign body in esophagus causing other injury, initial encounter: Principal | ICD-10-CM | POA: Diagnosis present

## 2010-12-27 DIAGNOSIS — E1149 Type 2 diabetes mellitus with other diabetic neurological complication: Secondary | ICD-10-CM | POA: Diagnosis present

## 2010-12-27 DIAGNOSIS — K920 Hematemesis: Secondary | ICD-10-CM | POA: Diagnosis present

## 2010-12-27 DIAGNOSIS — K224 Dyskinesia of esophagus: Secondary | ICD-10-CM | POA: Diagnosis present

## 2010-12-27 DIAGNOSIS — K219 Gastro-esophageal reflux disease without esophagitis: Secondary | ICD-10-CM | POA: Diagnosis present

## 2010-12-27 DIAGNOSIS — Z6838 Body mass index (BMI) 38.0-38.9, adult: Secondary | ICD-10-CM

## 2010-12-27 DIAGNOSIS — E1142 Type 2 diabetes mellitus with diabetic polyneuropathy: Secondary | ICD-10-CM | POA: Diagnosis present

## 2010-12-27 LAB — BASIC METABOLIC PANEL
BUN: 15 mg/dL (ref 6–23)
CO2: 27 mEq/L (ref 19–32)
Calcium: 8.6 mg/dL (ref 8.4–10.5)
Chloride: 108 mEq/L (ref 96–112)
Creatinine, Ser: 0.78 mg/dL (ref 0.4–1.2)
GFR calc Af Amer: >60 mL/min (ref >60)
GFR calc non Af Amer: >60 mL/min (ref >60)
Glucose, Bld: 102 mg/dL — ABNORMAL HIGH (ref 70–99)
Potassium: 3.7 mEq/L (ref 3.5–5.1)

## 2010-12-27 LAB — DIFFERENTIAL
Basophils Absolute: 0 10*3/uL (ref 0.0–0.1)
Basophils Relative: 0 % (ref 0–1)
Eosinophils Relative: 1 % (ref 0–5)
Lymphs Abs: 2.5 10*3/uL (ref 0.7–4.0)
Monocytes Relative: 5 % (ref 3–12)

## 2010-12-27 LAB — CBC
MCH: 26.4 pg (ref 26.0–34.0)
MCV: 81.6 fL (ref 78.0–100.0)
Platelets: 279 10*3/uL (ref 150–400)

## 2010-12-27 LAB — TYPE AND SCREEN
ABO/RH(D): O POS
Antibody Screen: NEGATIVE

## 2010-12-27 LAB — POCT PREGNANCY, URINE: Preg Test, Ur: NEGATIVE

## 2010-12-27 LAB — OCCULT BLOOD, POC DEVICE: Fecal Occult Bld: POSITIVE

## 2010-12-28 LAB — CBC
Hemoglobin: 9.5 g/dL — ABNORMAL LOW (ref 12.0–15.0)
MCV: 82.5 fL (ref 78.0–100.0)
Platelets: 250 10*3/uL (ref 150–400)
WBC: 6.5 10*3/uL (ref 4.0–10.5)

## 2010-12-29 ENCOUNTER — Encounter: Payer: Self-pay | Admitting: Pulmonary Disease

## 2010-12-29 LAB — CBC
HCT: 29.3 % — ABNORMAL LOW (ref 36.0–46.0)
Hemoglobin: 9.2 g/dL — ABNORMAL LOW (ref 12.0–15.0)
MCH: 26.3 pg (ref 26.0–34.0)
MCHC: 31.4 g/dL (ref 30.0–36.0)
MCV: 83.7 fL (ref 78.0–100.0)
Platelets: 226 10*3/uL (ref 150–400)
RBC: 3.5 MIL/uL — ABNORMAL LOW (ref 3.87–5.11)
WBC: 6.8 10*3/uL (ref 4.0–10.5)

## 2010-12-30 ENCOUNTER — Inpatient Hospital Stay (HOSPITAL_COMMUNITY): Payer: Managed Care, Other (non HMO)

## 2010-12-30 LAB — PROTIME-INR: INR: 1.03 (ref 0.00–1.49)

## 2010-12-30 LAB — COMPREHENSIVE METABOLIC PANEL
ALT: 18 U/L (ref 0–35)
AST: 20 U/L (ref 0–37)
Albumin: 3.1 g/dL — ABNORMAL LOW (ref 3.5–5.2)
Calcium: 8.5 mg/dL (ref 8.4–10.5)
Chloride: 108 mEq/L (ref 96–112)
Creatinine, Ser: 0.8 mg/dL (ref 0.4–1.2)
GFR calc Af Amer: >60 mL/min (ref >60)
Sodium: 140 mEq/L (ref 135–145)
Total Bilirubin: 0.2 mg/dL — ABNORMAL LOW (ref 0.3–1.2)
Total Protein: 6.2 g/dL (ref 6.0–8.3)

## 2010-12-30 LAB — APTT: aPTT: 30 seconds (ref 24–37)

## 2010-12-30 LAB — CBC
HCT: 29.8 % — ABNORMAL LOW (ref 36.0–46.0)
MCV: 81.4 fL (ref 78.0–100.0)
Platelets: 232 10*3/uL (ref 150–400)
RBC: 3.66 MIL/uL — ABNORMAL LOW (ref 3.87–5.11)
WBC: 6.8 10*3/uL (ref 4.0–10.5)

## 2011-01-05 NOTE — Discharge Summary (Signed)
  NAMEFRANKEE, Dominique Evans           ACCOUNT NO.:  [id number]  MEDICAL RECORD NO.:  [id number]           PATIENT TYPE:  I  LOCATION:  5148                         FACILITY:  [facility]  PHYSICIAN:  Dominique Evans, M.D.  DATE OF BIRTH:  [birth year]  DATE OF ADMISSION:  12/27/2010 DATE OF DISCHARGE:  12/30/2010                              DISCHARGE SUMMARY   HISTORY OF PRESENT ILLNESS:  [name] is a 45 year old female who presented with hematemesis and nausea and vomiting and abdominal discomfort.  She is a patient who has had a previous laparoscopic adjustable band placement in 2007.  She subsequently has had some postoperative nausea and vomiting basically ever since that time, previously having had an upper GI in 2008 showing evidence of a small hiatal hernia above the level of the Lap-Band but no definitive reflux. The patient again has continued to have history of intermittent nausea and vomiting.    Prior to admission she developed some significant nausea and vomiting that appeared to be blood-tinged.  She states she felt as if she were gagging on a large food chuck that she had ingested.  She presented to the hospital, and due to her symptoms a decision was made to admit the patient for inpatient management and workup.  She was placed n.p.o. and actually gradually made significant gains far as her symptoms.  She had no residual hematemesis.  No further nausea, vomiting, and no further pain.  Plain films showed that her Lap- Band appeared to be in good position.  The patient had previously seen Dr. Johna Evans as an outpatient at which time all the fluid in her band was aspirated.  However, despite this the patient has continued to have symptoms.    An upper GI study was ordered for today prior to the patient's discharge.  This showed changes including an increase in her hiatal hernia above the Lap-Band and  esophageal dysmotility and delay of contrast passage at the GE  junction. However, the patient is symptomatically clear and therefore would like to try liquid diet prior to discharge home.  She was given full liquid diet and toleration was determined to be safe for discharge home.  DISCHARGE DIAGNOSES: 1. Nausea, vomiting, hematemesis - likely related to food impaction at     the band - resolved. 2. 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  PLAN:  The patient will follow a liquid diet per our recommendation. She is scheduled to see Dr. Johna Evans as an outpatient next week.  No other changes to her home medications are noted.    Brayton El, PA-C   Dominique Evans, M.D., FACS   KB/MEDQ  D:  12/30/2010  T:  12/31/2010  Job:  [id number]  Electronically Signed by Brayton El  on 12/31/2010 02:09:33 PM Electronically Signed by Ovidio Kin M.D. on 01/05/2011 07:56:48 AM

## 2011-01-08 ENCOUNTER — Ambulatory Visit: Payer: Self-pay | Admitting: Pulmonary Disease

## 2011-03-03 NOTE — Procedures (Signed)
NAMESHAHAD, Dominique Evans           ACCOUNT NO.:  [id number]   MEDICAL RECORD NO.:  [id number]          PATIENT TYPE:  OUT   LOCATION:  SLEEP CENTER                 FACILITY:  [facility]   PHYSICIAN:  Coralyn Helling, MD        DATE OF BIRTH:  16-Aug-1966   DATE OF STUDY:  08/21/2007                            NOCTURNAL POLYSOMNOGRAM   REFERRING PHYSICIAN:  Coralyn Helling, MD   INDICATION FOR STUDY:  This is an individual who complains of sleep  disruption with excessive daytime sleepiness.  She also has a history of  hypertension.  She is referred to the sleep lab for evaluation of  hypersomnia with obstructive sleep apnea.   EPWORTH SLEEPINESS SCORE:  Is 15.   MEDICATIONS:  Symbicort, vitamin C, Slow-iron, verapamil, Singulair,  simvastatin, amitriptyline.   SLEEP ARCHITECTURE:  Total recording time was 425 minutes.  Total sleep  period was 397 minutes.  Total sleep time was 240 minutes.  Sleep  efficiency was 80%.  Sleep latency is 14 minutes.  REM latency is 137  minutes.  The patient was observed in all stages of sleep but had a  reduction of the percentage of slow wave sleep to 7% of the study.  The  patient slept exclusively in the non-supine position.   RESPIRATORY DATA:  The average respiratory rate was 16.  The overall  apnea/hypopnea index was 1.9.  The overall respiratory disturbance index  was 6.3.  The events were exclusively obstructive in nature.  Moderate  snoring was noted by the technician.  The REM apnea/hypopnea index was  8.  The non-REM apnea/hypopnea index was zero.   OXYGEN DATA:  The baseline oxygenation was 98%.  The oxygen saturation  nadir was 91%.   CARDIAC DATA:  The average heart rate was 78 and the rhythm strip showed  normal sinus rhythm.   MOVEMENT-PARASOMNIA:  The periodic limb movement index was zero.   IMPRESSIONS-RECOMMENDATIONS:  This study shows evidence for mild sleep  disordered breathing as demonstrated by respiratory disturbance index of  6.3  with an oxygen saturation nadir of 91%.  She did have a predominance  of events during REM sleep.  Of note is that she did not have any supine  sleep and therefore, the  severity of her sleep disordered breathing may be somewhat under  estimated.  Depending upon the patient's clinical status, further  interventions could include CPAP therapy, oral appliance, or surgical  intervention.      Coralyn Helling, MD  Diplomat, American Board of Sleep Medicine  Electronically Signed     VS/MEDQ  D:  09/08/2007 08:36:39  T:  09/08/2007 13:17:26  Job:  [id number]

## 2011-03-03 NOTE — Assessment & Plan Note (Signed)
[facility]                             PULMONARY OFFICE NOTE   Evans, Dominique                    MRN:          [id number]  DATE:07/29/2007                            DOB:          08/09/1966    I saw Dominique Evans today in evaluation for her symptoms of cough.   She had laparoscopic adjustable gastric band placement with truncal  vagotomy in May of 2007.  She says that since February of 2008 she has  been having problems with chest tightness, shortness of breath, and a  cough.  She has also been having wheezing as well as a globus sensation.  She says that her cough is usually dry, although she will occasionally  get clear sputum production.  She does also have productions with  postnasal drip.  She is not having much as far as reflux symptoms.  She  has been seen previously by an allergist, Dr. Willa Rough, and was told that  she had allergies to pine, pecan, and mold.  She says she has had  pneumonia twice previously.  I have received copies of x-ray reports  from her primary care physician, Dr. Wylene Simmer, from Mar 07, 2007, which  showed a right-sided aortic arch, but otherwise was unremarkable, and  these were similar findings to x-rays from February 03, 2007 and November 23, 2006.  She says she lost about 105 pounds after she had the gastric  band placement, but she has gained approximately 15 pounds due to  repeated need for use of prednisone due to her respiratory symptoms.  She denies any symptoms of chest pain or palpitations.  She does get  short of breath with exertion as well as sometimes occasionally at rest.  She also occasionally gets headaches and she feels anxious.  She is not  having any problems with fever, sweats, or chills, or leg swelling.  She  has not had pulmonary function testing done recently.  She has not had  any recent sick exposures.  There is no significant travel history.  She  did not have any significant exposure to  animals, pets, or birds.   PAST MEDICAL HISTORY:  1. Significant for vascular ring with right aortic arch.  2. She has diabetes.  3. Diabetic neuropathy.  4. Asthma.  5. Hypertension.  6. Allergies.  7. Hydatidiform mole.  8. Left elbow fracture.  9. Left nephrectomy in March 2002.  10.Arthroscopic surgery on her right shoulder.  11.Gastric band placement with truncal vagotomy in May of 2007.   ALLERGIES:  1. SULFA.  2. PENICILLIN.  Both cause her to get shortness of breath.   CURRENT MEDICATIONS:  1. Vitamin C.  2. Flintstones Vitamins.  3. Slow iron.  4. Viactiv with calcium.  5. B complex with Vitamin C.  6. Symbicort 160/4.5 two puffs b.i.d.  7. Verapamil 240 mg daily.  8. Singulair 10 mg at bedtime.  9. Simvastatin 20 mg daily.  10.Amitriptyline 25 mg daily.   SOCIAL HISTORY:  She is married.  She has two children.  She quit  smoking in 1984 and used to smoke a  pack of cigarettes a day for about  two years.  She quit doing it in 1984.  She works as a Armed forces training and education officer.   FAMILY HISTORY:  Significant for her father who had heart disease and  allergies.  Mother had skin cancer.  Her sister had allergies.   REVIEW OF SYSTEMS:  Unremarkable except for as stated above.   PHYSICAL EXAMINATION:  She is 5 feet 6 inches tall, weight is 277  pounds, temperature is 98.4, blood pressure is 136/72, heart rate is 84.  Oxygen saturation is 97% on room air.  HEENT:  Pupils reactive.  There is no sinus tenderness.  She has some  narrow nasal angles.  There is paranasal discharge.  There is mild  erythema of the posterior pharynx.  NECK:  There is no lymphadenopathy.  HEART:  S1/S2.  CHEST:  There was no wheezing or rales.  ABDOMEN:  Obese, soft, nontender.  EXTREMITIES:  There was no edema, cyanosis, or clubbing.  NEUROLOGIC:  She had decreased sensation in her legs.   IMPRESSION:  Chronic cough in the setting of allergies with probable  postnasal drip and chronic  rhinitis, as well as possible asthma.  She  does not appear to have much as far as reflux symptoms.  What I would do  is continue her on her current inhaler regimen of Symbicort and also  continue her Singulair.  I will try to augment her sinus regimen.  I  have given her a sample and instructed her on the use of Veramyst which  she is to use two sprays in each nostril once daily.  I have also  instructed her on the use of nasal irrigation.  I have also advised her  that she could try using over the counter antihistamines, then depending  upon her response to these interventions, I will decide if she would  need to undergo pulmonary function testing, as well as making further  adjustments in her inhaler regimen.  She may also need to undergo  further laboratory assessment to determine if she would need any further  interventions for her allergy  symptoms.  If these interventions are then unremarkable or unrevealing,  then she may need to have further evaluation for possible reflux.   I will follow up with her in approximately 3-4 weeks.     Coralyn Helling, MD  Electronically Signed    VS/MedQ  DD: 08/03/2007  DT: 08/03/2007  Job #: [id number]

## 2011-03-03 NOTE — Assessment & Plan Note (Signed)
[facility]                             PULMONARY OFFICE NOTE   [name], [name]                    MRN:          [id number]  DATE:08/12/2007                            DOB:          May 29, 1966    I saw [name] in followup today for her cough symptoms.   She had undergone pulmonary function tests at [facility] on  October 24, and this showed a post bronchodilator FEV1 to FVC ratio 84%.  Her FEV1 was 2.97 L which was 104% predicted.  Her FVC was 3.55 L which  was 102% predicted.  She did have suggestion of a bronchodilator  response based on the change in her FEF 25-75.  Her total lung capacity  was 4.89 L which was 90% predicted.  Her diffusion capacity was 84% of  predicted.  These pulmonary function test findings would be consistent  with normal spirometry, normal lung volumes, normal diffusion capacity,  and suggestion of mild bronchodilator response.   She continues to use her Singulair and Symbicort.  She says that she has  noticed some mild improvement in her sinus symptoms from the Veramyst  and nasal irrigation.  She has also started to use Prilosec over the  counter and says that this has helped with some of her reflux symptoms.  However, she still has problems where she wakes up with a choking and  gagging sensation, particularly when she lays flat in bed.  She says  this is less problematic when she sleeps sitting up in a chair.  She  wakes up frequently during the night.  She also complains of feeling  tired throughout the day.   What I have suggested is that [name] continue on her current  inhaler regimen of Symbicort and Singulair for her asthma.  I have also  advised her to continue on her sinus regimen of Veramyst and nasal  irrigation for her chronic rhinitis.  She is to continue on her Prilosec  for now for her reflux symptoms.   I am also concerned that she may, in fact, have some degree of  sleep  disordered breathing.  This is of particular concern given her history  of hypertension and diabetes.  To further assess this, I will make  arrangements for her to undergo an overnight polysomnogram.  I will then  follow up with her after I have had a chance to review this, and will  discuss these issues further with her to determine what therapeutic  interventions will be necessary then.     Coralyn Helling, MD  Electronically Signed    VS/MedQ  DD: 08/12/2007  DT: 08/13/2007  Job #: [id number]

## 2011-03-03 NOTE — H&P (Signed)
Dominique Evans, Dominique Evans           ACCOUNT NO.:  [id number]   MEDICAL RECORD NO.:  [id number]           PATIENT TYPE:   LOCATION:                                 FACILITY:   PHYSICIAN:  Barry Dienes. Eloise Harman, M.D.DATE OF BIRTH:  Mar 31, 1966   DATE OF ADMISSION:  DATE OF DISCHARGE:                              HISTORY & PHYSICAL   CHIEF COMPLAINT:  Chest pain.   HISTORY OF PRESENT ILLNESS:  The patient is a 45 year old Caucasian  woman with multiple medical problems.  She complained of 1 week of chest  discomfort described as occurring at rest or increased with exertion,  substernal in location and pressure-like, lasting up to several hours at  a time and associated with nausea.  She denied shortness of breath,  radiation of the discomfort to her neck, or diaphoresis.  Of note, in  2007 she had a laparoscopic gastric banding procedure with most recent  barium swallow study on March 19, 2008, showing a rather marked dilatation  of the esophagus down to the laparoscopic band where there was a thin  column of barium traversing through the bands.   Her past medical history is most significant for diabetes mellitus type  2 controlled with diet and exercise, hypertension, dyslipidemia, and a  very strong family history for premature coronary artery disease.  In  2004, she had a Cardiolite exercise test that was normal.   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   MEDICATIONS:  Albuterol HFA p.r.n. wheezing, Calan SR 240 mg daily,  amitriptyline 825 mg p.o. daily, Protonix 40 mg p.o. daily, Singulair 10  mg p.o. daily, and she is status post treatment with methotrexate,  folate, and Zocor.   ALLERGIES:  PENICILLIN and SULFA drugs have been associated with marked  difficulty breathing, X-PREP has been associated with an asthma attack.   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  Her husband works as a Nurse, learning disability for the [employer] in [place] and is  frequently in [place].  She has a history of tobacco use of 2 packs per day  that was stopped in 1984 and history of some alcohol use that was  stopped in 1984.   FAMILY HISTORY:  Her father died at age 96 of myocardial infarction and  he also had hyperlipidemia.  Her mother has hypertension, diabetes  mellitus type 2, and glaucoma.  A paternal grandfather had diabetes  mellitus type 1.  A sister had a myocardial infarction at age 40 and she  also had hyperlipidemia and diabetes mellitus type 2.  The patient has 4  children who are healthy.   REVIEW OF SYSTEMS:  She has gained approximately 2 pounds since her last  visit in March 2009.  She denies recent fever or chills.  She also  denies change in her vision, palpitations, dyspnea on exertion,  constipation, or diarrhea.  She has symptoms of GERD if she eats more  than her small gastric channel can accommodate.  She has had some nausea  over the past week with rare episodes of vomiting.  She has chronic mild  bilateral knee pain.  She denies recent headaches, anxiety, or  depression.   INITIAL PHYSICAL EXAM:  VITAL SIGNS:  Blood pressure 130/90, pulse 76,  respirations 20,  temperature 98.4, weight 217 pounds (note that her  weight at the time of laparoscopic banding procedure was 300 pounds).  GENERAL:  She is a mildly overweight white female who is in no apparent  distress.  HEAD, EYES, EARS, NOSE, AND THROAT:  Within normal limits.  NECK:  Supple and without jugular venous distention or carotid bruit.  CHEST:  Clear to auscultation.  HEART:  Regular rate and rhythm without significant murmur or gallop.  ABDOMEN:  Normal bowel sounds and no hepatosplenomegaly or tenderness.  EXTREMITIES:  Without cyanosis, clubbing, or edema, and the feet were  sensate.  NEUROLOGICAL:  She was alert and well-oriented with normal affect and  she showed no focal neurologic deficits.   LABORATORY STUDIES:  Office EKG showed the following:  1. Normal sinus rhythm.  2. Within normal limits.   Serum sodium was 139, potassium 3.9, chloride 106, carbon dioxide 25,  glucose 76, BUN 12, and creatinine 0.68.  Total bilirubin 0.5, alkaline  phosphatase 75, AST 21, ALT 22, total protein 7.1, albumin 3.9, and  calcium 9.0.  Total CPK 62.  Troponin I less than 0.01.  White blood  cells 8.0, hemoglobin 13, hematocrit 38.7, and platelets to 253,000.   IMPRESSION AND PLAN:  1. Chest pain:  Her pain has some typical features for coronary artery      disease, in particular slight worsening with exertion and      substernal location.  She also has a very strong family history for      premature coronary artery disease with additional risk factors of      diabetes mellitus type 2, hypertension, and dyslipidemia.      Alternatively, her chest discomfort could be due to worsening      esophageal obstruction from her gastric banding procedure for      gastroesophageal reflux.  It is unclear to me as to whether not her      band was adjusted after the most recent barium swallow study.  I      plan on checking serial cardiac isoenzymes to rule out the unlikely      possibility of a non-Q-wave  myocardial infarction.  We will also      ask the assistance of a cardiology consultant regarding appropriate      risk stratification.  2. Diabetes mellitus type 2:  Stable on diet and exercise with      hemoglobin A1c level of 5.5% in September 2008.  3. Hyperlipidemia:  For unclear reasons, she is no longer taking      Zocor.  In March 2009,  she had dyslipidemia with total cholesterol      176, triglycerides 74, HDL cholesterol 34, and LDL cholesterol 127.      If she does not tolerate statin drug treatment, she could be a      reasonable candidate for treatment with a fibrate such as TriCor.  4. Hypertension:  Well controlled with weight loss on her current      medications.           ______________________________  Barry Dienes Eloise Harman, M.D.    DGP/MEDQ  D:  03/30/2008  T:  03/31/2008  Job:  [id number]

## 2011-03-03 NOTE — Discharge Summary (Signed)
Dominique Evans, Dominique Evans           ACCOUNT NO.:  [id number]   MEDICAL RECORD NO.:  [id number]          PATIENT TYPE:  INP   LOCATION:  4735                         FACILITY:  [facility]   PHYSICIAN:  [name], M.D.DATE OF BIRTH:  [birth year]   DATE OF ADMISSION:  03/30/2008  DATE OF DISCHARGE:  04/02/2008                               DISCHARGE SUMMARY   FINAL DIAGNOSES:  1. Noncardiac chest pain status post negative catheterization.  2. 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  3. Diabetes mellitus type 2 with peripheral neuropathy, diet-      controlled.  4. Psoriasis.  5. Seasonal allergic rhinitis.  6. Hypertension.  7. Panic attacks.  8. Left interstitial hernia.  9. Obstructive sleep apnea.  10.Exogenous obesity.  11.Status post LAP-BAND.  12.Asthma.   MEDICATIONS:  1. Albuterol HFA p.r.n. for wheezing.  2. Kaolin SR 240 mg once daily.  3. Amitriptyline 25 mg p.o. daily.  4. Protonix 40 mg daily.  5. Singulair 10 mg p.o. daily   ALLERGIES:  PENICILLIN and SULFA.   RESULTS:  The patient had cardiac catheterization in the morning of April 02, 2008, which showed clean coronaries and an ejection fraction of 60%.  Cardiac panel was negative for elevated enzymes.  C-MET was within  normal limits with BUN and creatinine 12 and 0.6 respectively.  LFTs  were within normal limits.   PHYSICAL EXAMINATION ON DATE OF DISCHARGE:  VITALS:  Blood pressure  143/88, pulse 71, respiratory rate 18, sating 98% room air, and  temperature 97.1.  GENERAL:  No acute distress.  HEENT:  Normocephalic, atraumatic.  PERRLA.  EOMI.  ENT is within normal  limits.  NECK:  Supple.  No adenopathy, JVD, or bruit.  HEART:  Regular rate and rhythm.  No murmur, rub, or gallop.  LUNGS:  Clear to auscultation bilaterally.  ABDOMEN: Status post the patient is status post LAP-BAND.  The surgical  scar was noted.  No tenderness noted.  EXTREMITIES:  No clubbing, cyanosis, or  edema.  The patient has area  from catheter access in her right groin which is without hematoma.   HOSPITAL COURSE:  [name] was admitted to [facility] on March 30, 2008.  After waking up in the morning with chest pain, she drove to [place].  Her family drove back as she was continuing to have the chest pain.  She  was assessed and subsequently hospitalized, as she has a positive family  history as well as a history of vascular disease.  The patient was  observed through the course of the weekend and was placed on IV heparin.  She, however, did not have any worsening of chest pain and her cardiac  enzymes were negative.  The patient was placed for a cardiac  catheterization on April 02, 2008, with the above findings which were  negative indicating that this is noncardiac in nature.  The patient was  reassured of this and told to try taking over-the-counter antacids.  Of  note, her LAP-BAND had recently been let out because of some issues with  dysphagia.  The patient is discharged  home in good condition.   INSTRUCTIONS:  The patient is to follow up with Dr. Wylene Simmer as needed.  He is to follow up with Dr. Deborah Chalk as dictated through post  catheterization orders      [name], M.D.  Electronically Signed     RWT/MEDQ  D:  04/02/2008  T:  04/03/2008  Job:  [id number]   cc:   Colleen Can. Deborah Chalk, M.D.

## 2011-03-03 NOTE — Consult Note (Signed)
[name], [name]           ACCOUNT NO.:  [id number]   MEDICAL RECORD NO.:  [id number]          PATIENT TYPE:  INP   LOCATION:  4735                         FACILITY:  [facility]   PHYSICIAN:  Colleen Can. Deborah Chalk, M.D.DATE OF BIRTH:  10/12/1966   DATE OF CONSULTATION:  DATE OF DISCHARGE:                                 CONSULTATION   Thank you very much for asking me to see Dominique Evans for evaluation  of chest pain.  She is a very pleasant 45 year old female who has had  diabetes, hyperlipidemia, and obesity.  She has had Lap-Band surgery  with basically resolution of morbid obesity and diabetes.   Approximately a week ago, she had an episode of substernal heavy chest  discomfort and had recurrence.  She attributed some of this chest pain  to confounding issues of [place] relatives cooking with oil during the  visit or the recent relaxation of her Lap-Band surgery.  However, the  discomfort was worse this morning and was persistent and in light of  this marked family history of heart disease, she is referred now for  evaluation, admission, and possible catheterization.   She has a history of hypercholesterolemia.  She has had diabetes and is  currently not on medicines.   ALLERGIES:  PENICILLIN and SULFA.  X-Prep with an asthma attack.  __________ causes chest pain.   FAMILY HISTORY:  Markedly positive for heart disease early with one  sister having heart attack in early 30s and both parents having  premature heart attacks.   CURRENT MEDICATIONS:  1. Protonix 40 mg a day.  2. Provera 2 puff b.i.d.  3. Verapamil 240 mg per day.  4. Loratadine 10 mg per day.  5. Amitriptyline 25 mg nightly.   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  She has 3  children that are alive and well.  Her husband is [place] descent,  recently received [place] citizenship but works in [place].   PHYSICAL EXAM:  GENERAL:  She is a very pleasant alert white female.  VITAL SIGNS:  Blood  pressure 130/90 and heart rate 76.  HEENT:  Negative.  NECK:  Supple.  LUNGS:  Clear.  HEART:  Regular rate and rhythm.  EXTREMITIES:  Without edema.  There is good pedal pulses.   EKG is normal at rest.   Urinalysis was negative.   Other lab is pending.   OVERALL IMPRESSION:  1. Chest pain, rule out myocardial infarction.  2. Strongly positive family history of heart disease.  3. History of diabetes and obesity, resolved with Lap-Band surgery.  4. Hypertension.  5. History of gastroesophageal reflux.   PLAN:  Will admit.  We will rule out myocardial infarction.  I think it  is most reasonable to proceed on with cardiac catheterization.  Given  the extensive family history of multiple risk factors, I think  catheterization would be the best way to solve the question of this  recurrent chest pain.      Colleen Can. Deborah Chalk, M.D.  Electronically Signed     SNT/MEDQ  D:  03/30/2008  T:  03/31/2008  Job:  [id number]   cc:   Barry Dienes. Eloise Harman, M.D.

## 2011-03-04 ENCOUNTER — Other Ambulatory Visit: Payer: Self-pay | Admitting: General Surgery

## 2011-03-04 ENCOUNTER — Encounter (HOSPITAL_COMMUNITY): Payer: Managed Care, Other (non HMO)

## 2011-03-04 LAB — BASIC METABOLIC PANEL
BUN: 12 mg/dL (ref 6–23)
CO2: 29 mEq/L (ref 19–32)
Calcium: 8.9 mg/dL (ref 8.4–10.5)
Chloride: 100 mEq/L (ref 96–112)
Creatinine, Ser: 0.74 mg/dL (ref 0.4–1.2)
Potassium: 3.5 mEq/L (ref 3.5–5.1)

## 2011-03-04 LAB — CBC
HCT: 35.5 % — ABNORMAL LOW (ref 36.0–46.0)
Hemoglobin: 11.2 g/dL — ABNORMAL LOW (ref 12.0–15.0)
MCH: 24 pg — ABNORMAL LOW (ref 26.0–34.0)
MCHC: 31.5 g/dL (ref 30.0–36.0)
MCV: 76.2 fL — ABNORMAL LOW (ref 78.0–100.0)
RBC: 4.66 MIL/uL (ref 3.87–5.11)
RDW: 13.9 % (ref 11.5–15.5)

## 2011-03-04 LAB — HCG, SERUM, QUALITATIVE: Preg, Serum: NEGATIVE

## 2011-03-06 NOTE — Op Note (Signed)
[name], [name]           ACCOUNT NO.:  [id number]   MEDICAL RECORD NO.:  [id number]          PATIENT TYPE:  OIB   LOCATION:  1621                         FACILITY:  [facility]   PHYSICIAN:  Sharlet Salina T. Hoxworth, M.D.DATE OF BIRTH:  Mar 03, 1966   DATE OF PROCEDURE:  03/01/2006  DATE OF DISCHARGE:                                 OPERATIVE REPORT   PREOPERATIVE DIAGNOSIS:  Morbid obesity.   POSTOPERATIVE DIAGNOSIS:  Morbid obesity.   SURGICAL PROCEDURE:  Laparoscopic adjustable gastric band placement with  truncal vagotomy.   SURGEON:  Lorne Skeens. Hoxworth, M.D.   ASSISTANT:  Alfonse Ras, M.D.   ANESTHESIA:  General.   BRIEF HISTORY:  [name] is a 45 year old female with long history of  progressive morbid obesity unresponsive to medical management.  Following an  extensive preoperative workup and discussion detailed elsewhere, we have  elected proceed with placement of laparoscopic adjustable gastric band with  truncal vagotomy, participating in the vagotomy study.  The patient has  significant comorbidities of hypertension, adult-onset diabetes mellitus, as  well as asthma and joint pain.  The potential benefits and risks of the  operation have been discussed extensively and detailed separately.  The  patient is now brought to the operating room for these procedures.   DESCRIPTION OF OPERATION:  The patient was brought to the operating room and  placed in the supine position on the operating table and general  endotracheal anesthesia was induced.  She received preoperative IV  antibiotics.  Orogastric tube was placed.  The abdomen was widely sterilely  prepped and draped.  Correct patient and procedure were verified.  Local  anesthesia was used to infiltrate the trocar sites prior to the incisions.  A 1 cm incision was made in the left subcostal space and access obtained to  the abdominal cavity without difficulty using the 11 mm Optiview trocar and  pneumoperitoneum established.  Under direct vision a 12 mm trocar was placed  in the right subxiphoid area through the falciform ligament, an 11 mm trocar  in the right mid-upper abdomen, another 11 mm trocar for the camera port  just to the left and above the umbilicus.  Through a 5-mm site in the  subxiphoid area, the [facility] consent retractor was placed and the left  lobe of the liver elevated with excellent exposure of the upper stomach and  hiatus.  An additional 5 mm trocar was placed in the left flank.  Initial  attention was turned to the vagotomy.  The gastrohepatic omentum was divided  in an avascular space and dissection carried up onto the right crus.  There  was apparent a somewhat large vessel running in the gastrocolic omentum up  near the esophagus, and this was able to be preserved with adequate  exposure.  Careful blunt dissection was carried along the anterior border of  the right crus and dissection carried into the retroesophageal space.  It  was a little more difficult than usual to dissect here due to the fact we  were unable to completely put the esophagus on tension due to the vessel in  the gastrohepatic  omentum, but dissection posterior to the esophagus did  show what appeared to be an actually divided vagus nerve with two branches  running parallel to the esophagus, which were individually dissected,  controlled anteriorly and posteriorly with clips and excised.  These were  sent for frozen section and eventually did return showing nerve fiber.  The  area had been thoroughly dissected.  We did not see any other evidence of  vagus nerve fibers posteriorly.  Following this the peritoneal dissection  was carried up anterior to the esophagus and the anterior vagus trunk line  just to the left the midline was easily identified, dissected free with  Kitners, clipped proximally and distally and excised.  It was sent for gross  examination only, as it was an obvious  nerve trunk.  Following this Dr.  Luan Pulling performed upper endoscopy with [place] red and all mucosa stained  red without any evidence of vagal innervation.  The patient had been  Baclofen at the beginning of the case.  Following this, attention was turned  to lap band placement.  The right crus was again exposed and beginning more  inferiorly on the crus than the previous dissection at the base of the right  crus at the crossing fat pad, the retroperitoneum was carefully entered with  blunt dissection and using the finger dissection, we were able to easily  pass it posterior to the upper stomach and deploy this through the angle of  His just over the left crus, which had been previously dissected.  Following  this, a flushed 10 mm lap band system was introduced and the tubing passed  through the finger dissector and brought back posterior to the stomach and  the band was easily brought back posterior to the stomach.  The tubing  placed through the buckle and with the calibration tube in place, it was  snapped and seen to not be overly tight.  The calibration tube was removed.  With the tubing held inferiorly, the fundus beginning just beyond the angle  of His was imbricated up over the band to the small gastric pouch with three  interrupted sutures of 2-0 Ethibond.  The band rotated without undue tension  following the imbrication.  It was then inspected for hemostasis, which  appeared complete.  The band was lying in good orientation.  The tubing was  brought out through the right mid trocar site.  This incision was enlarged  laterally a couple of centimeters and the anterior fascia exposed.  The  tubing was cut and attached to the port, which was then sutured to the  anterior fascia with four interrupted 2-0 Prolene sutures.  This wound was  thoroughly irrigated.  The subcu was closed with running 2-0 Vicryl and the skin incisions closed with staples.  Sponge, needle and instrument  counts  correct.  Dry sterile dressings were applied and the patient taken to  recovery in good condition.      Lorne Skeens. Hoxworth, M.D.  Electronically Signed     BTH/MEDQ  D:  03/01/2006  T:  03/01/2006  Job:  [id number]

## 2011-03-09 ENCOUNTER — Ambulatory Visit (HOSPITAL_COMMUNITY)
Admission: RE | Admit: 2011-03-09 | Discharge: 2011-03-09 | Disposition: A | Discharge status: Home / Self Care | Payer: Managed Care, Other (non HMO) | Source: Ambulatory Visit | Attending: General Surgery | Admitting: General Surgery

## 2011-03-09 DIAGNOSIS — K9509 Other complications of gastric band procedure: Secondary | ICD-10-CM | POA: Insufficient documentation

## 2011-03-09 DIAGNOSIS — R1310 Dysphagia, unspecified: Secondary | ICD-10-CM | POA: Insufficient documentation

## 2011-03-09 DIAGNOSIS — Y831 Surgical operation with implant of artificial internal device as the cause of abnormal reaction of the patient, or of later complication, without mention of misadventure at the time of the procedure: Secondary | ICD-10-CM | POA: Insufficient documentation

## 2011-03-17 NOTE — Op Note (Signed)
[name], [name]           ACCOUNT NO.:  [id number]  MEDICAL RECORD NO.:  [id number]           PATIENT TYPE:  O  LOCATION:  DAYL                         FACILITY:  [facility]  PHYSICIAN:  Sharlet Salina T. Gilbert Manolis, M.D.DATE OF BIRTH:  04/27/1966  DATE OF PROCEDURE:  03/09/2011 DATE OF DISCHARGE:  03/09/2011                              OPERATIVE REPORT   PREOPERATIVE DIAGNOSIS:  Status post Lap-Band with esophageal dilatation.  POSTOPERATIVE DIAGNOSIS:  Status post Lap-Band with esophageal dilatation.  SURGICAL PROCEDURE:  Laparoscopic removal of Lap-Band.  SURGEON:  Lorne Skeens. Brantley Wiley, MD  ASSISTANT:  Sandria Bales. Ezzard Standing, MD  ANESTHESIA:  General.  BRIEF HISTORY:  Dominique Evans is a 45 year old female, status post Lap-Band placed with vagotomy in May 2007.  The patient has had difficulty with adjustments having had repeated episodes of esophageal dilatation and required removal of her fluid on a number of occasions. She most recently had all the fluid removed from her band and documented esophageal dilatation of her GI series and the band appeared to be in good position.  She, however, continues to have dysphagia and difficulty even with all the fluid removed from her band.  The patient has lost 50 pounds from preop, but still has a weight of 251 pounds.  Due to difficulty with adjustment, repeat esophageal dilatation, dysphagia, all fluid removed.  After discussion in the office, we have elected to remove her Lap-Band laparoscopically with consideration for converting to a gastric bypass at a later date.  Ashby Dawes of the surgery, risks have been documented and discussed elsewhere.  She is now brought to the operating room for this procedure.  DESCRIPTION OF PROCEDURE:  The patient was brought to the operating room and placed in supine position on the operating table.  General endotracheal anesthesia was induced.  She received preoperative IV antibiotics and subcutaneous  heparin.  The abdomen was widely, sterilely prepped and draped.  Correct patient and procedure were verified. Access was obtained with a 5 mm Optiview trocar initially in the left upper quadrant through one of the previous incision sites. Pneumoperitoneum was established.  There was no evidence of trocar injury.  The patient had a previous abdominoplasty and there was now no scar over her ports.  I made an incision adjacent to the port and a 12 mm trocar was inserted here under direct vision and subsequently one further 5 mm trocar toward the midline in the right upper abdomen and one 5 mm trocar laterally.  There were a fair amount of adhesions of the stomach down to the pylorus up to the underside left lobe of liver that were taken down with sharp dissection.  There was no injury of the stomach.  Hemostasis was obtained in the liver with cautery.  We were able to get [facility] retractor in, through the subxiphoid 5 mm site and adhesions of the left lobe of liver over the band were carefully taken down with Harmonic scalpel and dissection carried down onto the buckle of the band.  The cicatrix here was incised and staying right on the band, the cicatrix was opened up over the buckle and the band to where the  band could be cut and then it was removed from its tract intact. The band was cut from the tubing in the abdominal wall and then extracted through the 12 mm trocar site and 12 mm trocar replaced. There was quite a bit of scarring around the hiatus and down around the band, but we could see the plication sutures more superiorly and it certainly seemed feasible that she could be converted to a gastric bypass at a later date.  The abdomen was inspected for hemostasis, there was no evidence of bleeding and no evidence of visceral injury.  At this point, CO2 was evacuated and the trocars were removed.  The excision of the 12 mm trocar at the port site was enlarged slightly and  dissection was carried down over the port.  It was grasped and elevated.  The 4 Prolene stay sutures were removed and the port and remainder of the tubing removed intact.  Incisions were treated with local anesthesia. Subcutaneous was closed with running 3-0 Vicryl and the skin with subcuticular Monocryl and Dermabond.  Sponge and instrument counts were correct.  The patient was taken to Recovery in good condition.     Lorne Skeens. Rachele Lamaster, M.D.     Tory Emerald  D:  03/09/2011  T:  03/10/2011  Job:  [id number]  Electronically Signed by Glenna Fellows M.D. on 03/17/2011 10:08:36 AM

## 2011-05-12 ENCOUNTER — Other Ambulatory Visit: Payer: Self-pay | Admitting: Gastroenterology

## 2011-05-25 ENCOUNTER — Ambulatory Visit (HOSPITAL_COMMUNITY)
Admission: RE | Admit: 2011-05-25 | Discharge: 2011-05-25 | Disposition: A | Discharge status: Home / Self Care | Payer: Managed Care, Other (non HMO) | Source: Ambulatory Visit | Attending: Gastroenterology | Admitting: Gastroenterology

## 2011-05-25 DIAGNOSIS — K219 Gastro-esophageal reflux disease without esophagitis: Secondary | ICD-10-CM | POA: Insufficient documentation

## 2011-05-25 DIAGNOSIS — I10 Essential (primary) hypertension: Secondary | ICD-10-CM | POA: Insufficient documentation

## 2011-05-25 DIAGNOSIS — R1310 Dysphagia, unspecified: Secondary | ICD-10-CM | POA: Insufficient documentation

## 2011-06-11 NOTE — Op Note (Signed)
  NAMEMAKAELAH, Dominique Evans           ACCOUNT NO.:  [id number]  MEDICAL RECORD NO.:  [id number]  LOCATION:  WLEN                         FACILITY:  [facility]  PHYSICIAN:  Kanyah Matsushima L. Malon Kindle., M.D.DATE OF BIRTH:  02-04-1966  DATE OF PROCEDURE:  05/25/2011 DATE OF DISCHARGE:  05/25/2011                              OPERATIVE REPORT   PROCEDURE:  Esophageal manometry.  INDICATION:  A 45 year old woman who has had a previous laparoscopic banding procedure done and had severe problems with esophageal reflux, requiring the band to be removed.  She is being considered for gastric bypass.  She has had previous severe reflux, has been somewhat asymptomatic after the laparoscopic band was removed, on Dexilant therapy.  It is anticipated that she could need an antireflux procedure at the same time as the gastric bypass and for this reason, esophageal manometry is done.  She does not really have any symptoms suggestive of dysphagia. DESCRIPTION OF PROCEDURE:  The procedure was performed in the usual manner and the results are as follows. 1. Lower esophageal sphincter measures approximately 35 mm within the     normal range with 81% relaxation with swallowing.  The sphincter     does appeared grossly relax with swallowing. 2. Esophageal body wet swallows were performed.  There is a mean     pressure, the distal esophagus 39 mm which is the lower end of the     normal range.  The amplitude of the contractions in the proximal     and mid esophagus are low.  Tracing sent to me for evaluation of     very poor quality.  Only 2 wet swallows were sent, but appeared to     be peristaltic.  Esophageal sphincter relaxes with swallowing.  ASSESSMENT:  Lower esophageal sphincter appears grossly normal, but there is poor motility in the esophageal body with probable dysmotility. This could well be contributing to her esophageal reflux symptoms.          ______________________________ Llana Aliment Malon Kindle.,  M.D.     Waldron Session  D:  06/10/2011  T:  06/11/2011  Job:  [id number]  cc:   Lorne Skeens. Hoxworth, M.D. [address] [place] [address]  Electronically Signed by Carman Ching M.D. on 06/11/2011 11:34:41 AM

## 2011-06-28 ENCOUNTER — Emergency Department: Payer: Self-pay | Admitting: Internal Medicine

## 2011-07-11 ENCOUNTER — Emergency Department (HOSPITAL_COMMUNITY)
Admission: EM | Admit: 2011-07-11 | Discharge: 2011-07-12 | Disposition: A | Discharge status: Home / Self Care | Payer: Managed Care, Other (non HMO) | Attending: Emergency Medicine | Admitting: Emergency Medicine

## 2011-07-11 DIAGNOSIS — H109 Unspecified conjunctivitis: Secondary | ICD-10-CM | POA: Insufficient documentation

## 2011-07-11 DIAGNOSIS — J45909 Unspecified asthma, uncomplicated: Secondary | ICD-10-CM | POA: Insufficient documentation

## 2011-07-11 DIAGNOSIS — I10 Essential (primary) hypertension: Secondary | ICD-10-CM | POA: Insufficient documentation

## 2011-07-11 DIAGNOSIS — H5710 Ocular pain, unspecified eye: Secondary | ICD-10-CM | POA: Insufficient documentation

## 2011-07-11 DIAGNOSIS — R51 Headache: Secondary | ICD-10-CM | POA: Insufficient documentation

## 2011-07-11 DIAGNOSIS — H209 Unspecified iridocyclitis: Secondary | ICD-10-CM | POA: Insufficient documentation

## 2011-07-16 LAB — COMPREHENSIVE METABOLIC PANEL
ALT: 22
AST: 21
Alkaline Phosphatase: 75
CO2: 25
Calcium: 9
GFR calc Af Amer: >60
Glucose, Bld: 76
Potassium: 3.9
Sodium: 139
Total Protein: 7.1

## 2011-07-16 LAB — CARDIAC PANEL(CRET KIN+CKTOT+MB+TROPI)
CK, MB: 0.8
CK, MB: 0.8
CK, MB: 1.1
Relative Index: INVALID
Relative Index: INVALID
Total CK: 62
Troponin I: 0.03
Troponin I: <0.01

## 2011-07-16 LAB — HEPARIN LEVEL (UNFRACTIONATED)
Heparin Unfractionated: 0.7
Heparin Unfractionated: 0.73 — ABNORMAL HIGH
Heparin Unfractionated: 0.61

## 2011-07-16 LAB — CBC
Hemoglobin: 13
MCHC: 33.6
MCV: 83.9
RBC: 4.61
RDW: 13.5

## 2011-07-16 LAB — PROTIME-INR
INR: 1
Prothrombin Time: 12.9

## 2011-07-17 ENCOUNTER — Encounter (INDEPENDENT_AMBULATORY_CARE_PROVIDER_SITE_OTHER): Payer: Self-pay | Admitting: General Surgery

## 2011-07-17 ENCOUNTER — Ambulatory Visit (INDEPENDENT_AMBULATORY_CARE_PROVIDER_SITE_OTHER): Payer: Managed Care, Other (non HMO) | Admitting: General Surgery

## 2011-07-17 LAB — CBC WITH DIFFERENTIAL/PLATELET
Basophils Absolute: 0 10*3/uL (ref 0.0–0.1)
Eosinophils Relative: 2 % (ref 0–5)
HCT: 36.8 % (ref 36.0–46.0)
Hemoglobin: 11.3 g/dL — ABNORMAL LOW (ref 12.0–15.0)
Lymphocytes Relative: 26 % (ref 12–46)
Lymphs Abs: 1.8 10*3/uL (ref 0.7–4.0)
MCV: 76.8 fL — ABNORMAL LOW (ref 78.0–100.0)
Monocytes Absolute: 0.4 10*3/uL (ref 0.1–1.0)
Monocytes Relative: 6 % (ref 3–12)
Neutro Abs: 4.7 10*3/uL (ref 1.7–7.7)
RBC: 4.79 MIL/uL (ref 3.87–5.11)
RDW: 16.8 % — ABNORMAL HIGH (ref 11.5–15.5)
WBC: 7.1 10*3/uL (ref 4.0–10.5)

## 2011-07-17 LAB — HEMOGLOBIN A1C: Hgb A1c MFr Bld: 7.3 % — ABNORMAL HIGH (ref <5.7)

## 2011-07-17 LAB — COMPREHENSIVE METABOLIC PANEL
AST: 23 U/L (ref 0–37)
Albumin: 4.1 g/dL (ref 3.5–5.2)
Alkaline Phosphatase: 100 U/L (ref 39–117)
BUN: 11 mg/dL (ref 6–23)
CO2: 25 mEq/L (ref 19–32)
Calcium: 9.4 mg/dL (ref 8.4–10.5)
Chloride: 100 mEq/L (ref 96–112)
Creat: 0.66 mg/dL (ref 0.50–1.10)
Glucose, Bld: 125 mg/dL — ABNORMAL HIGH (ref 70–99)
Potassium: 4 mEq/L (ref 3.5–5.3)

## 2011-07-17 LAB — TSH: TSH: 1.525 u[IU]/mL (ref 0.350–4.500)

## 2011-07-17 LAB — T4: T4, Total: 9.2 ug/dL (ref 5.0–12.5)

## 2011-07-17 NOTE — Patient Instructions (Signed)
Work on maintaining or reducing her weight prior to surgery. Call for questions regarding the preop test.

## 2011-07-17 NOTE — Progress Notes (Signed)
Subjective:   Morbid obesity  Patient ID: Dominique Evans, female   DOB: August 11, 1966, 45 y.o.   MRN: [id number]  HPI Patient returns to the office for further planning for possible Roux-en-Y gastric bypass. To recap, she has a history of vagotomy and LAP-BAND placement in 2007. She initially did well with about 80 pound weight loss but has developed recurrent severe esophageal dilatation with attempts at filling the band. This continued despite all the fluid removed from her band and we removed the band laparoscopically several months ago. She wants conversion to gastric bypass. She has experienced some weight regain after band removal and now weighs 270 pounds which is down about 30 pounds from her preop band weight. She has comorbidities of adult-onset diabetes mellitus history of hypertension elevated cholesterol and asthma. We have previously discussed extensively the Roux-en-Y gastric bypass and the increased risk that is involved to to this being revisional surgery and she understands and wants to proceed.  Past Medical History  Diagnosis Date  . Unspecified essential hypertension   . Hyperlipidemia   . GERD (gastroesophageal reflux disease)   . 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 Chronic constipation   . Nephrolithiasis   . Obesity   . Neuropathy   . Asthma     PFT 08/12/07: FEV1 2.97 (104%), FEV1% 84, TLC 4.89 (90%), DLCO 84%, +BD  . Allergic rhinitis   . Panic attacks   . Hydatidiform mole   . Diabetes mellitus   . Visual disturbance    Past Surgical History  Procedure Date  . Nephrectomy 2002    left  . Elbow fracture surgery     left  . Shoulder arthroscopy     right shoulder  . Laparoscopic gastric banding 02/2006    w/ truncal vagotomy  . Patent ductus arterious repair    Current Outpatient Prescriptions  Medication Sig Dispense Refill  . metFORMIN (GLUMETZA) 500 MG (MOD) 24 hr tablet Take 500 mg by mouth 2 (two) times  daily with a meal.        . amitriptyline (ELAVIL) 25 MG tablet Take 1 tablet by mouth once daily       . budesonide (PULMICORT) 0.5 MG/2ML nebulizer solution 1 vial in nebulizer twice daily       . desvenlafaxine (PRISTIQ) 50 MG 24 hr tablet Take by mouth daily.        Marland Kitchen dexlansoprazole (DEXILANT) 60 MG capsule Take by mouth daily.        . furosemide (LASIX) 20 MG tablet Take by mouth daily.        . mometasone (NASONEX) 50 MCG/ACT nasal spray 2 sprays in each nostril once daily       . montelukast (SINGULAIR) 10 MG tablet Take 1 tablet by mouth once daily       . polyethylene glycol (MIRALAX) powder Mix 1 capful in 8 ounces of water daily       . rosuvastatin (CRESTOR) 10 MG tablet Take by mouth daily.        . verapamil (COVERA HS) 240 MG (CO) 24 hr tablet Take 1 tablet by mouth once daily        Allergies  Allergen Reactions  . Penicillins Anaphylaxis  . Sulfonamide Derivatives Anaphylaxis   History  Substance Use Topics  . Smoking status: Former Smoker -- 2.0 packs/day    Types: Cigarettes    Quit date: 10/19/1982  . Smokeless tobacco: Never Used  .  Alcohol Use: No    Review of Systems  Constitutional: Positive for fatigue.  HENT: Negative.   Eyes: Positive for redness.  Respiratory: Negative.   Cardiovascular: Negative.   Gastrointestinal: Negative.   Genitourinary: Negative.   Musculoskeletal: Positive for arthralgias.       Objective:   Physical Exam General: Obese Caucasian female in no acute distress Skin: Warm and dry without rash or infection HEENT: No palpable masses or thyromegaly. There is some slight injection of the right sclerae. Oropharynx clear. Lymph nodes: No cervical supraclavicular or inguinal nodes palpable Lungs: Clear without wheezing or increased work of breathing Cardiovascular: Regular rate and rhythm without murmur. 1+ lower extremity edema. Abdomen: Healed laparoscopic and panniculectomy incisions. Soft and nontender without masses  organomegaly. No hernias detected. She does have a left flank incision from nephrectomy with some mild diffuse bulging. Extremities: One plus edema without chronic venous stasis changes Neurologic: Alert and fully oriented. Gait normal.    Assessment:     Morbid obesity with failed lap band requiring the removal due to recurrent esophageal dilatation. She has had esophageal manometry that does show peristalsis although weak. She desires gastric bypass and I think this is reasonable. We discussed the procedure in detail and the fact there are increased risks due to her previous lap band specifically leakage or bleeding. She has a complete consent for the review. We'll go ahead with preoperative workup.    Plan:     Proceed with preoperative workup for planned conversion to Roux-en-Y gastric bypass

## 2011-07-21 ENCOUNTER — Other Ambulatory Visit (INDEPENDENT_AMBULATORY_CARE_PROVIDER_SITE_OTHER): Payer: Self-pay | Admitting: General Surgery

## 2011-08-05 ENCOUNTER — Encounter: Payer: Self-pay | Admitting: *Deleted

## 2011-08-05 ENCOUNTER — Encounter: Payer: Managed Care, Other (non HMO) | Attending: General Surgery | Admitting: *Deleted

## 2011-08-05 DIAGNOSIS — Z9884 Bariatric surgery status: Secondary | ICD-10-CM | POA: Insufficient documentation

## 2011-08-05 DIAGNOSIS — Z09 Encounter for follow-up examination after completed treatment for conditions other than malignant neoplasm: Secondary | ICD-10-CM | POA: Insufficient documentation

## 2011-08-05 DIAGNOSIS — Z713 Dietary counseling and surveillance: Secondary | ICD-10-CM | POA: Insufficient documentation

## 2011-08-05 NOTE — Patient Instructions (Signed)
   Follow Pre-Op Nutrition Goals to prepare for Gastric Bypass Surgery.   Call the [facility] at [phone] once you have been given your surgery date to enrolled in the Pre-Op Nutrition Class. You will need to attend this nutrition class 3-4 weeks prior to your surgery. 

## 2011-08-05 NOTE — Progress Notes (Signed)
  Pre-Op Assessment Visit: Pre-Operative Gastric Bypass Surgery  Medical Nutrition Therapy:  Appt start time: 1000 end time:  1045.  Patient was seen on 08/05/2011 for Pre-Operative Gastric bypass Nutrition Assessment. Assessment and letter of approval faxed to [facility] Bariatric Surgery Program coordinator on 08/05/2011.    Handouts given during visit include:  Pre-Op Goals Handout  Patient to call for Pre-Op and Post-Op Nutrition Education at the [facility] when surgery is scheduled.

## 2011-08-07 ENCOUNTER — Ambulatory Visit (HOSPITAL_COMMUNITY)
Admission: RE | Admit: 2011-08-07 | Discharge: 2011-08-07 | Disposition: A | Discharge status: Home / Self Care | Payer: Managed Care, Other (non HMO) | Source: Ambulatory Visit | Attending: General Surgery | Admitting: General Surgery

## 2011-08-07 DIAGNOSIS — E669 Obesity, unspecified: Secondary | ICD-10-CM | POA: Insufficient documentation

## 2011-08-07 DIAGNOSIS — Z01818 Encounter for other preprocedural examination: Secondary | ICD-10-CM | POA: Insufficient documentation

## 2011-08-13 ENCOUNTER — Ambulatory Visit (HOSPITAL_COMMUNITY)
Admission: RE | Admit: 2011-08-13 | Discharge: 2011-08-13 | Disposition: A | Discharge status: Home / Self Care | Payer: Managed Care, Other (non HMO) | Source: Ambulatory Visit | Attending: General Surgery | Admitting: General Surgery

## 2011-08-13 DIAGNOSIS — E785 Hyperlipidemia, unspecified: Secondary | ICD-10-CM | POA: Insufficient documentation

## 2011-08-13 DIAGNOSIS — Z13820 Encounter for screening for osteoporosis: Secondary | ICD-10-CM | POA: Insufficient documentation

## 2011-08-13 DIAGNOSIS — E119 Type 2 diabetes mellitus without complications: Secondary | ICD-10-CM | POA: Insufficient documentation

## 2011-08-13 DIAGNOSIS — K219 Gastro-esophageal reflux disease without esophagitis: Secondary | ICD-10-CM | POA: Insufficient documentation

## 2011-08-13 DIAGNOSIS — Z6841 Body Mass Index (BMI) 40.0 and over, adult: Secondary | ICD-10-CM | POA: Insufficient documentation

## 2011-08-13 DIAGNOSIS — I10 Essential (primary) hypertension: Secondary | ICD-10-CM | POA: Insufficient documentation

## 2011-08-13 DIAGNOSIS — Q249 Congenital malformation of heart, unspecified: Secondary | ICD-10-CM | POA: Insufficient documentation

## 2011-08-18 ENCOUNTER — Encounter: Payer: Managed Care, Other (non HMO) | Admitting: *Deleted

## 2011-08-18 NOTE — Patient Instructions (Signed)
Goals:  Eat 3 meals/day, Avoid meal skipping   Increase protein rich foods  Follow "Pre-Op" Goals for surgery  Limit carbohydrate1-2 servings/meal   Begin transitioning diet to Pre-Op Diet  1200 kcals, 60-80g protein  Choose more whole grains, lean protein, low-fat dairy, and fruits/non-starchy vegetables.   Aim for >30 min of physical activity daily  Limit caffeine beverages

## 2011-08-18 NOTE — Progress Notes (Addendum)
  2nd Supervised Weight Loss Visit: Pre-Operative Gastric Bypass Surgery  Medical Nutrition Therapy:  Appt start time: 1715 end time:  1730.  Assessment:  Primary concerns today: post-operative bariatric surgery nutrition management. Dominique Evans reports that she has eliminated diet sodas (carbonated beverages) over the past two weeks. She has been successful with drinking "Gastric-Bypass" friendly beverages. Dominique Evans also reports that she has increased her activity levels through her ADL's.  Weight today: 278.2 lbs Weight change: 1.8 lb gain Total weight lost: N/A BMI: 43.5% Surgery date: TBA  24-hr recall:  B (7-8 AM): Egg (2), 2 pieces [place] bacon, 15 grapes Snk (AM): n/a   L (12-1 PM): Salad w/ vegetables, taco meat, cheese, sour cream Snk (3-4 PM): n/a  D (6-8 PM): Low-carb tortilla shells, fajita chicken, lettuce and tomato (2) Snk (PM): Cheese stick (1oz)  Fluid intake: water = 64 oz Estimated total protein intake: 60-70g  Medications: No medication changes Supplementation: Taking a multivitamin daily  CBG monitoring: Daily Average CBG per patient: 130-180's  Lab Results  Component Value Date   HGBA1C 7.3* 07/17/2011   Using straws: No  Drinking while eating: No; started practicing some Carbonated beverages: Eliminated two weeks N/V/D/C: N/A  Recent physical activity:  Walking more at work and through [employer]  Progress Towards Goal(s):  Modified goal(s).  Handouts given during visit include:  Bariatric Surgery Support Group Information  Pre-Op Diet (1200 Kcal Diet, Low Carbohydrate)   Nutritional Diagnosis:  Dominique Evans-3.3 Overweight/obesity As related to physical inactivity and poor past dietary habits.  As evidenced by pt with BMI >30%.    Intervention:  Nutrition education.  Monitoring/Evaluation:  Dietary intake, exercise, and body weight. Follow up in 0.5 months for 3rd month supervised weight loss visit.

## 2011-08-18 NOTE — Progress Notes (Addendum)
  1st Supervised Weight Loss Visit: Pre-Operative Gastric Bypass Surgery   Medical Nutrition Therapy: Appt start time: 0900 end time:1000  Assessment: Primary concerns today: post-operative bariatric surgery nutrition management. Pt to have Gastric Bypass surgery, here to complete 47-months of supervised weight loss per [employer].  Weight today: 276.4 lbs  BMI: 43.9%  Surgery date: TBA    24-hr recall:  B (7-8 AM): Egg (2), 2 pieces [place] bacon, 15 grapes  Snk (AM): n/a  L (12-1 PM): Salad w/ vegetables, deli meat, cheese, light dressing Snk (3-4 PM): n/a  D (6-8 PM): Grilled chicken, 1/2 potato, green vegetable Snk (PM): N/A  Fluid intake: water = 64 oz  Estimated total protein intake: 60-70g   Medications: See medication list. Supplementation: Taking a multivitamin daily   CBG monitoring: Daily  Average CBG per patient: 130-200's  Lab Results   Component  Value  Date    HGBA1C  7.3*  07/17/2011    Recent physical activity:  Very limited activity levels  Progress Towards Goal(s): Modified goal(s).   Handouts given during visit include:  Pre Op Nutrition Goals   Nutritional Diagnosis:  [place]-3.3 Overweight/obesity As related to physical inactivity and poor past dietary habits. As evidenced by pt with BMI >30%.  Intervention: Nutrition education and assessment.   Monitoring/Evaluation: Dietary intake, exercise, and body weight. Follow up in 0.5 months for 2nd month supervised weight loss visit.

## 2011-08-27 ENCOUNTER — Encounter: Payer: Managed Care, Other (non HMO) | Attending: General Surgery | Admitting: *Deleted

## 2011-08-27 ENCOUNTER — Encounter: Payer: Self-pay | Admitting: *Deleted

## 2011-08-27 DIAGNOSIS — Z713 Dietary counseling and surveillance: Secondary | ICD-10-CM | POA: Insufficient documentation

## 2011-08-27 DIAGNOSIS — Z9884 Bariatric surgery status: Secondary | ICD-10-CM | POA: Insufficient documentation

## 2011-08-27 DIAGNOSIS — Z09 Encounter for follow-up examination after completed treatment for conditions other than malignant neoplasm: Secondary | ICD-10-CM | POA: Insufficient documentation

## 2011-08-27 NOTE — Progress Notes (Signed)
  3rd Supervised Weight Loss Visit: Pre-Operative Gastric Bypass Surgery   Medical Nutrition Therapy: Appt start time: 1550 end time: 1615  Assessment: Primary concerns today: post-operative bariatric surgery nutrition management. Dominique Evans has lost 4 lbs since her last visit. She reports that she has been doing better making "bariatric friendly" food choices. She has eliminated snacking on candy and chips as well as continuing to avoid sugar-sweetened beverages. She continues to struggle with not drinking 15 minutes before, during, and 30 minutes after eating.  Weight today: 274.2 lbs  Weight change: 4 lbs loss   Total weight lost: N/A  BMI:  45% Surgery date: TBA   24-hr recall:  B (7-8 AM): Egg (2), 2 pieces [place] sausage patty, 1/4 cup low-fat cheese Snk (AM): n/a  L (12-1 PM): Salad w/ vegetables, grilled chicken, light dressing Snk (3-4 PM): 2 oz pistachios OR Protein Shake D (6-8 PM): Whole grain pita, grilled chicken, salad, no dressing Snk (PM): Cheese stick (1oz)   Fluid intake: water = 64 oz (all sugar-free beverages) Estimated total protein intake: 60-70g   Medications: No medication changes  Supplementation: Taking a multivitamin daily   CBG monitoring: Daily  Average CBG per patient: 130-180's  Last A1c (08/20/11): 7.1% (per pt)  No results found for this basename: CHOL, HDL, LDLCALC, LDLDIRECT, TRIG, CHOLHDL   Using straws: No  Drinking while eating: No; started practicing some  Carbonated beverages: Eliminated two weeks  N/V/D/C: N/A   Recent physical activity: Walking more at work and through [employer]; No structured physical activity  Progress Towards Goal(s): Some progress.  Handouts given during visit include:  Bariatric Surgery Support Group Information: 2013 Dates Pre-Op Diet (1200 Kcal Diet, Low Carbohydrate)  Nutritional Diagnosis:  Edison-3.3 Overweight/obesity As related to physical inactivity and poor past dietary habits. As evidenced by pt with BMI >30%.     Intervention: Nutrition education.   Monitoring/Evaluation: Dietary intake, exercise, and body weight. Follow up for Pre-Op Nutrition Class when surgery has been scheduled.

## 2011-08-27 NOTE — Patient Instructions (Signed)
Goals:  Eat 3 meals/day, Avoid meal skipping   Increase protein rich foods  Follow "Pre-Op" Goals for surgery  Limit carbohydrate1-2 servings/meal   Begin transitioning diet to Pre-Op Diet  1200 kcals, 60-80g protein  Choose more whole grains, lean protein, low-fat dairy, and fruits/non-starchy vegetables.   Aim for >30 min of physical activity daily  Limit caffeine beverages 

## 2011-09-17 ENCOUNTER — Telehealth (INDEPENDENT_AMBULATORY_CARE_PROVIDER_SITE_OTHER): Payer: Self-pay | Admitting: General Surgery

## 2011-09-17 ENCOUNTER — Encounter: Payer: Managed Care, Other (non HMO) | Admitting: *Deleted

## 2011-09-17 NOTE — Progress Notes (Signed)
  Bariatric Class:  Appt start time: 1700 end time:  1800.  Pre-Operative Nutrition Class  Patient was seen on 09/17/2011 for Pre-Operative Nutrition education at [facility].   Surgery date: 10/05/11 Surgery type: Gastric Bypass  Weight today: 279.5 lbs Weight change: 3.1 lb gain Total weight lost: n/a BMI: 44.4 Start wt @ [facility]: 276.4 lbs  Samples given per MNT protocol:  Bariatric Advantage Multivitamin  Lot # [id number]  Exp: 9/13   Bariatric Advantage Calcium Citrate  Lot # [id number]  Exp: 12/13   Celebrate Vitamins Multivitamin  Lot # [id number]  Exp: 6/14   Celebrate VitaminsCalcium Citrate  Lot # [id number]  Exp: 9/13  The following the learning objective met by the patient during this course:   Identifies Pre-Op Dietary Goals and will begin 2 weeks pre-operatively   Identifies appropriate sources of fluids and proteins   States protein recommendations and appropriate sources pre and post-operatively  Identifies Post-Operative Dietary Goals and will follow for 2 weeks post-operatively  Identifies appropriate multivitamin and calcium sources  Describes the need for physical activity post-operatively and will follow MD recommendations  States when to call healthcare provider regarding medication questions or post-operative complications  Handouts given during class include:  Pre-Op Bariatric Surgery Diet Handout  Protein Shake Handout  Post-Op Bariatric Surgery Nutrition Handout  BELT Program Information Flyer  Support Group Information Flyer  Follow-Up Plan: Patient will follow-up at [facility] 2 weeks post operatively for diet advancement per MD.

## 2011-09-17 NOTE — Patient Instructions (Signed)
Follow:    Pre-Op Diet per MD 2 weeks prior to surgery  Phase 2- Liquids (clear/full) 2 weeks after surgery  Vitamin/Mineral/Calcium guidelines for purchasing bariatric supplements  Exercise guidelines pre and post-op per MD  Follow-up at [facility] in 2 weeks post-op for diet advancement. Contact Catheline Hixon as needed with questions/concerns.  

## 2011-09-21 ENCOUNTER — Telehealth (INDEPENDENT_AMBULATORY_CARE_PROVIDER_SITE_OTHER): Payer: Self-pay

## 2011-09-21 NOTE — Telephone Encounter (Signed)
Patient called to see if she can start taking Humera prior to surgery, Dr. Johna Sheriff was in surgery and I reviewed with Dr. Andrey Campanile, he recommends patient not taking this medication until after surgery.  I left Dominique Evans a message to call our office to discuss this with her further.  I also sent Dr. Johna Sheriff a message regarding this as well.

## 2011-09-21 NOTE — Telephone Encounter (Signed)
Spoke with Ms Dominique Evans and she states she has not started taking the [place] as of 09/21/11, I told patient to wait until after surgery and Dr. Johna Sheriff will let her know when she can start taking her [place].

## 2011-09-22 NOTE — Telephone Encounter (Signed)
error 

## 2011-09-23 ENCOUNTER — Encounter (INDEPENDENT_AMBULATORY_CARE_PROVIDER_SITE_OTHER): Payer: Self-pay | Admitting: General Surgery

## 2011-09-24 ENCOUNTER — Ambulatory Visit (INDEPENDENT_AMBULATORY_CARE_PROVIDER_SITE_OTHER): Payer: Managed Care, Other (non HMO) | Admitting: General Surgery

## 2011-09-24 ENCOUNTER — Encounter (INDEPENDENT_AMBULATORY_CARE_PROVIDER_SITE_OTHER): Payer: Self-pay | Admitting: General Surgery

## 2011-09-24 NOTE — Patient Instructions (Signed)
Continue preop diet. Call for any concerns prior to surgery

## 2011-09-24 NOTE — Progress Notes (Signed)
Subjective:   Morbid obesity  Patient ID: Dominique Evans, female   DOB: December 03, 1965, 45 y.o.   MRN: [id number]  HPI Patient returns to the office prior to planned laparoscopic Roux-en-Y gastric bypass. She has a many year history of progressive morbid obesity. She has a history of LAP-BAND placement with vagotomy and 2007 with initially excellent weight loss but developed progressive esophageal dilatation that did not respond to fluid removal for band and subsequently had laparoscopic removal of her port and band a number of months ago. She has had recurrence of morbid obesity since and after extensive preoperative discussion regarding alternatives and risks we plan to proceed with laparoscopic Roux-en-Y gastric bypass. We again repeated the procedure. She's been over the consent form line by line and all her questions were answered. Past Medical History  Diagnosis Date  . Unspecified essential hypertension   . Hyperlipidemia   . GERD (gastroesophageal reflux disease)   . 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 Chronic constipation   . Nephrolithiasis   . Obesity   . Neuropathy   . Asthma     PFT 08/12/07: FEV1 2.97 (104%), FEV1% 84, TLC 4.89 (90%), DLCO 84%, +BD  . Allergic rhinitis   . Panic attacks   . Hydatidiform mole   . Diabetes mellitus   . Visual disturbance    Past Surgical History  Procedure Date  . Nephrectomy 2002    left  . Elbow fracture surgery     left  . Shoulder arthroscopy     right shoulder  . Laparoscopic gastric banding 02/2006    w/ truncal vagotomy  . Patent ductus arterious repair   . Lap band removal     Current Outpatient Prescriptions  Medication Sig Dispense Refill  . amitriptyline (ELAVIL) 25 MG tablet Take 1 tablet by mouth once daily       . Ascorbic Acid (VITAMIN C) 1000 MG tablet Take 1,000 mg by mouth daily.        Marland Kitchen BAYER CONTOUR TEST test strip       . desvenlafaxine (PRISTIQ) 50 MG 24 hr tablet  Take by mouth daily.        Marland Kitchen dexlansoprazole (DEXILANT) 60 MG capsule Take by mouth daily.        . ferrous fumarate (HEMOCYTE - 106 MG FE) 325 (106 FE) MG TABS Take 2 tablets by mouth.        . furosemide (LASIX) 20 MG tablet Take by mouth daily.        Marland Kitchen losartan (COZAAR) 50 MG tablet       . metFORMIN (GLUMETZA) 500 MG (MOD) 24 hr tablet Take 500 mg by mouth 2 (two) times daily with a meal.        . montelukast (SINGULAIR) 10 MG tablet Take 1 tablet by mouth once daily       . Multiple Vitamins-Minerals (MULTIVITAMIN PO) Take by mouth daily.        . polyethylene glycol (MIRALAX) powder Mix 1 capful in 8 ounces of water daily       . PROAIR HFA 108 (90 BASE) MCG/ACT inhaler       . rosuvastatin (CRESTOR) 10 MG tablet Take 5 mg by mouth daily.       . verapamil (COVERA HS) 240 MG (CO) 24 hr tablet Take 1 tablet by mouth once daily       . budesonide (PULMICORT) 0.5 MG/2ML nebulizer solution 1  vial in nebulizer twice daily        Allergies  Allergen Reactions  . Penicillins Anaphylaxis  . Sulfonamide Derivatives Anaphylaxis   History  Substance Use Topics  . Smoking status: Former Smoker -- 2.0 packs/day    Types: Cigarettes    Quit date: 10/19/1982  . Smokeless tobacco: Never Used  . Alcohol Use: No     Review of Systems  Constitutional: Negative.   Respiratory: Negative.   Cardiovascular: Negative.        Objective:   Physical Exam General: Alert, well-developed morbidly obese female, in no distress Skin: Warm and dry without rash or infection. HEENT: No palpable masses or thyromegaly. Sclera nonicteric. Pupils equal round and reactive. Oropharynx clear. Lymph nodes: No cervical, supraclavicular, or inguinal nodes palpable. Lungs: Breath sounds clear and equal without increased work of breathing Cardiovascular: Regular rate and rhythm without murmur. No JVD or edema. Peripheral pulses intact. Abdomen: Nondistended. Soft and nontender. No masses palpable. No  organomegaly. No palpable hernias. Extremities: No edema or joint swelling or deformity. No chronic venous stasis changes. Neurologic: Alert and fully oriented. Gait normal.     Assessment:     45 year old female with progressive morbid obesity status post failed LAP-BAND secondary to esophageal dilatation. We plan to proceed with laparoscopic Roux-en-Y gastric bypass.    Plan:     Laparoscopic Roux-en-Y gastric bypass.

## 2011-09-28 ENCOUNTER — Telehealth (INDEPENDENT_AMBULATORY_CARE_PROVIDER_SITE_OTHER): Payer: Self-pay

## 2011-09-28 NOTE — Telephone Encounter (Signed)
[facility] calling for surgical orders on this pt scheduled for Lap RNY gastric bypass for 10-05-11. The pt has preop appt for 09-30-11 at [facility] and they need the orders./ [facility]

## 2011-09-29 ENCOUNTER — Encounter (HOSPITAL_COMMUNITY): Payer: Self-pay | Admitting: Pharmacy Technician

## 2011-09-29 ENCOUNTER — Other Ambulatory Visit (INDEPENDENT_AMBULATORY_CARE_PROVIDER_SITE_OTHER): Payer: Self-pay | Admitting: General Surgery

## 2011-09-30 ENCOUNTER — Other Ambulatory Visit: Payer: Self-pay

## 2011-09-30 ENCOUNTER — Encounter (HOSPITAL_COMMUNITY)
Admission: RE | Admit: 2011-09-30 | Discharge: 2011-09-30 | Disposition: A | Discharge status: Home / Self Care | Payer: Managed Care, Other (non HMO) | Source: Ambulatory Visit | Attending: General Surgery | Admitting: General Surgery

## 2011-09-30 ENCOUNTER — Encounter (HOSPITAL_COMMUNITY): Payer: Self-pay

## 2011-09-30 HISTORY — DX: Reserved for inherently not codable concepts without codable children: IMO0001

## 2011-09-30 HISTORY — DX: Other specified postprocedural states: Z98.890

## 2011-09-30 HISTORY — DX: Major depressive disorder, single episode, unspecified: F32.9

## 2011-09-30 HISTORY — DX: Nausea with vomiting, unspecified: R11.2

## 2011-09-30 HISTORY — DX: Encounter for other specified aftercare: Z51.89

## 2011-09-30 HISTORY — DX: Anemia, unspecified: D64.9

## 2011-09-30 HISTORY — DX: Psoriasis, unspecified: L40.9

## 2011-09-30 LAB — CBC
HCT: 36.8 % (ref 36.0–46.0)
Hemoglobin: 11.9 g/dL — ABNORMAL LOW (ref 12.0–15.0)
MCH: 24.9 pg — ABNORMAL LOW (ref 26.0–34.0)
MCHC: 32.3 g/dL (ref 30.0–36.0)
MCV: 77.1 fL — ABNORMAL LOW (ref 78.0–100.0)
Platelets: 275 10*3/uL (ref 150–400)
RBC: 4.77 MIL/uL (ref 3.87–5.11)
RDW: 16.8 % — ABNORMAL HIGH (ref 11.5–15.5)
WBC: 8.5 10*3/uL (ref 4.0–10.5)

## 2011-09-30 LAB — DIFFERENTIAL
Basophils Absolute: 0 10*3/uL (ref 0.0–0.1)
Basophils Relative: 1 % (ref 0–1)
Eosinophils Relative: 1 % (ref 0–5)
Lymphocytes Relative: 26 % (ref 12–46)
Lymphs Abs: 2.2 10*3/uL (ref 0.7–4.0)
Neutro Abs: 5.7 10*3/uL (ref 1.7–7.7)
Neutrophils Relative %: 67 % (ref 43–77)

## 2011-09-30 LAB — COMPREHENSIVE METABOLIC PANEL
ALT: 33 U/L (ref 0–35)
Albumin: 3.7 g/dL (ref 3.5–5.2)
Alkaline Phosphatase: 98 U/L (ref 39–117)
BUN: 19 mg/dL (ref 6–23)
CO2: 29 mEq/L (ref 19–32)
Calcium: 10.2 mg/dL (ref 8.4–10.5)
Creatinine, Ser: 0.78 mg/dL (ref 0.50–1.10)
GFR calc Af Amer: >90 mL/min (ref >90)
GFR calc non Af Amer: >90 mL/min (ref >90)
Glucose, Bld: 137 mg/dL — ABNORMAL HIGH (ref 70–99)
Sodium: 136 mEq/L (ref 135–145)
Total Protein: 8 g/dL (ref 6.0–8.3)

## 2011-09-30 LAB — SURGICAL PCR SCREEN
MRSA, PCR: NEGATIVE
Staphylococcus aureus: NEGATIVE

## 2011-09-30 NOTE — Pre-Procedure Instructions (Signed)
09/30/11/ B/P in right arm only due to the fact that vessel was cut at time of congenital defect heart surgery so pt states she has no pulse or blood pressure in left arm

## 2011-09-30 NOTE — Patient Instructions (Signed)
20 Hennie Gosa  09/30/2011   Your procedure is scheduled on:  10/05/11 415pm-757pm  Report to [facility] at 215 pm.  Call this number if you have problems the morning of surgery: [phone]   Remember: bowel prep per office    Do not eat food:After Midnight.  May have clear liquids:May have clear liquids from 12 midnite until 1000am then npo. .  Clear liquids include soda, tea, black coffee, apple or grape juice, broth.  Take these medicines the morning of surgery with A SIP OF WATER:    Do not wear jewelry, make-up or nail polish.  Do not wear lotions, powders, or perfumes.   Do not shave 48 hours prior to surgery.  Do not bring valuables to the hospital.  Contacts, dentures or bridgework may not be worn into surgery.  Leave suitcase in the car. After surgery it may be brought to your room.  For patients admitted to the hospital, checkout time is 11:00 AM the day of discharge.      Special Instructions: CHG Shower Use Special Wash: 1/2 bottle night before surgery and 1/2 bottle morning of surgery. shower chin to toes with CHG.  Wash face and private parts with regular soap.    Please read over the following fact sheets that you were given: MRSA Information, Incentive Spirometry Fact Sheet, coughing and deep breathing exercises.

## 2011-10-05 ENCOUNTER — Encounter (HOSPITAL_COMMUNITY)
Admission: RE | Disposition: A | Discharge status: Home / Self Care | Payer: Self-pay | Source: Ambulatory Visit | Attending: General Surgery

## 2011-10-05 ENCOUNTER — Inpatient Hospital Stay (HOSPITAL_COMMUNITY): Payer: Managed Care, Other (non HMO) | Admitting: Anesthesiology

## 2011-10-05 ENCOUNTER — Encounter (HOSPITAL_COMMUNITY): Payer: Self-pay | Admitting: *Deleted

## 2011-10-05 ENCOUNTER — Encounter (HOSPITAL_COMMUNITY): Payer: Self-pay | Admitting: Anesthesiology

## 2011-10-05 ENCOUNTER — Inpatient Hospital Stay (HOSPITAL_COMMUNITY)
Admission: RE | Admit: 2011-10-05 | Discharge: 2011-10-09 | DRG: 621 | Disposition: A | Discharge status: Home / Self Care | Payer: Managed Care, Other (non HMO) | Source: Ambulatory Visit | Attending: General Surgery | Admitting: General Surgery

## 2011-10-05 DIAGNOSIS — E119 Type 2 diabetes mellitus without complications: Secondary | ICD-10-CM | POA: Diagnosis present

## 2011-10-05 DIAGNOSIS — Z6841 Body Mass Index (BMI) 40.0 and over, adult: Secondary | ICD-10-CM

## 2011-10-05 DIAGNOSIS — I10 Essential (primary) hypertension: Secondary | ICD-10-CM | POA: Diagnosis present

## 2011-10-05 DIAGNOSIS — R000 Tachycardia, unspecified: Secondary | ICD-10-CM | POA: Diagnosis not present

## 2011-10-05 DIAGNOSIS — Z01812 Encounter for preprocedural laboratory examination: Secondary | ICD-10-CM

## 2011-10-05 HISTORY — PX: GASTRIC ROUX-EN-Y: SHX5262

## 2011-10-05 LAB — HEMOGLOBIN AND HEMATOCRIT, BLOOD: HCT: 35.2 % — ABNORMAL LOW (ref 36.0–46.0)

## 2011-10-05 SURGERY — LAPAROSCOPIC ROUX-EN-Y GASTRIC
Anesthesia: General | Site: Abdomen | Wound class: Clean Contaminated

## 2011-10-05 MED ORDER — UNJURY CHOCOLATE CLASSIC POWDER
2.0000 [oz_av] | Freq: Four times a day (QID) | ORAL | Status: DC
  Administered 2011-10-07 – 2011-10-08 (×4): 2 [oz_av] via ORAL

## 2011-10-05 MED ORDER — ACETAMINOPHEN 10 MG/ML IV SOLN
INTRAVENOUS | Status: AC
  Filled 2011-10-05: qty 100

## 2011-10-05 MED ORDER — LACTATED RINGERS IV SOLN
INTRAVENOUS | Status: DC
  Administered 2011-10-05: 1000 mL via INTRAVENOUS

## 2011-10-05 MED ORDER — HYDROMORPHONE HCL PF 1 MG/ML IJ SOLN: 0.2500 mg | INTRAMUSCULAR | Status: DC | PRN

## 2011-10-05 MED ORDER — LACTATED RINGERS IV SOLN: INTRAVENOUS | Status: DC

## 2011-10-05 MED ORDER — LACTATED RINGERS IV SOLN
INTRAVENOUS | Status: DC | PRN
  Administered 2011-10-05 (×3): via INTRAVENOUS

## 2011-10-05 MED ORDER — ALBUTEROL SULFATE HFA 108 (90 BASE) MCG/ACT IN AERS
2.0000 | INHALATION_SPRAY | RESPIRATORY_TRACT | Status: DC | PRN
  Filled 2011-10-05: qty 6.7

## 2011-10-05 MED ORDER — PROMETHAZINE HCL 25 MG/ML IJ SOLN
INTRAMUSCULAR | Status: AC
  Filled 2011-10-05: qty 1

## 2011-10-05 MED ORDER — TISSEEL VH 10 ML EX KIT
PACK | CUTANEOUS | Status: DC | PRN
  Administered 2011-10-05: 10 mL

## 2011-10-05 MED ORDER — LIDOCAINE HCL (CARDIAC) 20 MG/ML IV SOLN
INTRAVENOUS | Status: DC | PRN
  Administered 2011-10-05: 60 mg via INTRAVENOUS

## 2011-10-05 MED ORDER — HYDROMORPHONE HCL PF 1 MG/ML IJ SOLN
INTRAMUSCULAR | Status: DC | PRN
  Administered 2011-10-05 (×2): 0.5 mg via INTRAVENOUS
  Administered 2011-10-05: 1 mg via INTRAVENOUS

## 2011-10-05 MED ORDER — MOXIFLOXACIN HCL IN NACL 400 MG/250ML IV SOLN
400.0000 mg | INTRAVENOUS | Status: AC
  Administered 2011-10-05: 400 mg via INTRAVENOUS
  Filled 2011-10-05: qty 250

## 2011-10-05 MED ORDER — ACETAMINOPHEN 10 MG/ML IV SOLN
INTRAVENOUS | Status: DC | PRN
  Administered 2011-10-05: 1000 mg via INTRAVENOUS

## 2011-10-05 MED ORDER — HEPARIN SODIUM (PORCINE) 5000 UNIT/ML IJ SOLN
5000.0000 [IU] | Freq: Three times a day (TID) | INTRAMUSCULAR | Status: DC
  Administered 2011-10-06 – 2011-10-09 (×10): 5000 [IU] via SUBCUTANEOUS
  Filled 2011-10-05 (×14): qty 1

## 2011-10-05 MED ORDER — LACTATED RINGERS IR SOLN
Status: DC | PRN
  Administered 2011-10-05: 3000 mL

## 2011-10-05 MED ORDER — ONDANSETRON HCL 4 MG/2ML IJ SOLN
4.0000 mg | INTRAMUSCULAR | Status: DC | PRN
  Administered 2011-10-06: 4 mg via INTRAVENOUS
  Filled 2011-10-05: qty 2

## 2011-10-05 MED ORDER — NEOSTIGMINE METHYLSULFATE 1 MG/ML IJ SOLN
INTRAMUSCULAR | Status: DC | PRN
  Administered 2011-10-05: 1 mg via INTRAVENOUS

## 2011-10-05 MED ORDER — BUPIVACAINE-EPINEPHRINE 0.25% -1:200000 IJ SOLN
INTRAMUSCULAR | Status: AC
  Filled 2011-10-05: qty 1

## 2011-10-05 MED ORDER — MIDAZOLAM HCL 5 MG/5ML IJ SOLN
INTRAMUSCULAR | Status: DC | PRN
  Administered 2011-10-05: 2 mg via INTRAVENOUS

## 2011-10-05 MED ORDER — PROPOFOL 10 MG/ML IV EMUL
INTRAVENOUS | Status: DC | PRN
  Administered 2011-10-05: 200 mg via INTRAVENOUS

## 2011-10-05 MED ORDER — SUCCINYLCHOLINE CHLORIDE 20 MG/ML IJ SOLN
INTRAMUSCULAR | Status: DC | PRN
  Administered 2011-10-05: 150 mg via INTRAVENOUS

## 2011-10-05 MED ORDER — FENTANYL CITRATE 0.05 MG/ML IJ SOLN
INTRAMUSCULAR | Status: DC | PRN
  Administered 2011-10-05 (×4): 50 ug via INTRAVENOUS
  Administered 2011-10-05: 75 ug via INTRAVENOUS
  Administered 2011-10-05: 25 ug via INTRAVENOUS
  Administered 2011-10-05 (×2): 100 ug via INTRAVENOUS
  Administered 2011-10-05: 50 ug via INTRAVENOUS

## 2011-10-05 MED ORDER — STERILE WATER FOR IRRIGATION IR SOLN: Status: DC | PRN

## 2011-10-05 MED ORDER — UNJURY VANILLA POWDER: 2.0000 [oz_av] | Freq: Four times a day (QID) | ORAL | Status: DC

## 2011-10-05 MED ORDER — INSULIN ASPART 100 UNIT/ML ~~LOC~~ SOLN
0.0000 [IU] | SUBCUTANEOUS | Status: DC
  Administered 2011-10-05 – 2011-10-06 (×3): 3 [IU] via SUBCUTANEOUS
  Administered 2011-10-06: 2 [IU] via SUBCUTANEOUS
  Administered 2011-10-06: 5 [IU] via SUBCUTANEOUS
  Administered 2011-10-06 – 2011-10-08 (×6): 2 [IU] via SUBCUTANEOUS
  Filled 2011-10-05: qty 3

## 2011-10-05 MED ORDER — FIBRIN SEALANT COMPONENT 5 ML EX KIT
PACK | CUTANEOUS | Status: AC
  Filled 2011-10-05: qty 4

## 2011-10-05 MED ORDER — POTASSIUM CHLORIDE IN NACL 20-0.9 MEQ/L-% IV SOLN
INTRAVENOUS | Status: DC
  Administered 2011-10-05 – 2011-10-09 (×9): via INTRAVENOUS
  Filled 2011-10-05 (×12): qty 1000

## 2011-10-05 MED ORDER — ACETAMINOPHEN 160 MG/5ML PO SOLN: 650.0000 mg | ORAL | Status: DC | PRN

## 2011-10-05 MED ORDER — OXYCODONE-ACETAMINOPHEN 5-325 MG/5ML PO SOLN
5.0000 mL | ORAL | Status: DC | PRN
  Administered 2011-10-08 – 2011-10-09 (×6): 5 mL via ORAL
  Administered 2011-10-09: 10 mL via ORAL
  Filled 2011-10-05 (×6): qty 5
  Filled 2011-10-05: qty 10

## 2011-10-05 MED ORDER — GLYCOPYRROLATE 0.2 MG/ML IJ SOLN
INTRAMUSCULAR | Status: DC | PRN
  Administered 2011-10-05: .2 mg via INTRAVENOUS

## 2011-10-05 MED ORDER — CISATRACURIUM BESYLATE 2 MG/ML IV SOLN
INTRAVENOUS | Status: DC | PRN
  Administered 2011-10-05: 2 mg via INTRAVENOUS
  Administered 2011-10-05: 3 mg via INTRAVENOUS
  Administered 2011-10-05 (×2): 2 mg via INTRAVENOUS
  Administered 2011-10-05: 3 mg via INTRAVENOUS
  Administered 2011-10-05 (×3): 2 mg via INTRAVENOUS
  Administered 2011-10-05: 10 mg via INTRAVENOUS
  Administered 2011-10-05: 2 mg via INTRAVENOUS

## 2011-10-05 MED ORDER — EPHEDRINE SULFATE 50 MG/ML IJ SOLN
INTRAMUSCULAR | Status: DC | PRN
Start: 2011-10-05 — End: 2011-10-05
  Administered 2011-10-05: 5 mg via INTRAVENOUS

## 2011-10-05 MED ORDER — HEPARIN SODIUM (PORCINE) 5000 UNIT/ML IJ SOLN
5000.0000 [IU] | INTRAMUSCULAR | Status: AC
  Administered 2011-10-05: 5000 [IU] via SUBCUTANEOUS

## 2011-10-05 MED ORDER — MORPHINE SULFATE 2 MG/ML IJ SOLN
2.0000 mg | INTRAMUSCULAR | Status: DC | PRN
  Administered 2011-10-06 (×4): 2 mg via INTRAVENOUS
  Administered 2011-10-07: 4 mg via INTRAVENOUS
  Administered 2011-10-07: 2 mg via INTRAVENOUS
  Administered 2011-10-07: 4 mg via INTRAVENOUS
  Administered 2011-10-07 (×5): 2 mg via INTRAVENOUS
  Administered 2011-10-08: 4 mg via INTRAVENOUS
  Filled 2011-10-05 (×3): qty 1
  Filled 2011-10-05 (×2): qty 2
  Filled 2011-10-05: qty 1
  Filled 2011-10-05: qty 2
  Filled 2011-10-05 (×6): qty 1

## 2011-10-05 MED ORDER — BUPIVACAINE-EPINEPHRINE 0.25% -1:200000 IJ SOLN
INTRAMUSCULAR | Status: DC | PRN
  Administered 2011-10-05: 30 mL

## 2011-10-05 MED ORDER — INSULIN ASPART 100 UNIT/ML ~~LOC~~ SOLN
SUBCUTANEOUS | Status: AC
  Filled 2011-10-05: qty 1

## 2011-10-05 MED ORDER — SODIUM CHLORIDE 0.9 % IR SOLN
Status: DC | PRN
  Administered 2011-10-05: 1000 mL

## 2011-10-05 MED ORDER — UNJURY CHICKEN SOUP POWDER: 2.0000 [oz_av] | Freq: Four times a day (QID) | ORAL | Status: DC

## 2011-10-05 MED ORDER — ONDANSETRON HCL 4 MG/2ML IJ SOLN
INTRAMUSCULAR | Status: DC | PRN
  Administered 2011-10-05 (×3): 2 mg via INTRAVENOUS

## 2011-10-05 MED ORDER — PROMETHAZINE HCL 25 MG/ML IJ SOLN
6.2500 mg | INTRAMUSCULAR | Status: DC | PRN
  Administered 2011-10-05: 6.25 mg via INTRAVENOUS

## 2011-10-05 SURGICAL SUPPLY — 102 items
ADH SKN CLS APL DERMABOND .7 (GAUZE/BANDAGES/DRESSINGS)
APL SKNCLS STERI-STRIP NONHPOA (GAUZE/BANDAGES/DRESSINGS)
APL SRG 32X5 SNPLK LF DISP (MISCELLANEOUS) ×2
APPLICATOR COTTON TIP 6IN STRL (MISCELLANEOUS) ×5 IMPLANT
APPLIER CLIP ROT 13.4 12 LRG (CLIP)
APR CLP LRG 13.4X12 ROT 20 MLT (CLIP)
BAG SPEC RTRVL LRG 6X4 10 (ENDOMECHANICALS)
BENZOIN TINCTURE PRP APPL 2/3 (GAUZE/BANDAGES/DRESSINGS) IMPLANT
BLADE SURG 15 STRL LF DISP TIS (BLADE) IMPLANT
BLADE SURG 15 STRL SS (BLADE) ×2
BLADE SURG SZ11 CARB STEEL (BLADE) ×2 IMPLANT
CABLE HIGH FREQUENCY MONO STRZ (ELECTRODE) ×3 IMPLANT
CANISTER SUCTION 2500CC (MISCELLANEOUS) ×3 IMPLANT
CLIP APPLIE ROT 13.4 12 LRG (CLIP) IMPLANT
CLIP SUT LAPRA TY ABSORB (SUTURE) ×4 IMPLANT
CLOTH BEACON ORANGE TIMEOUT ST (SAFETY) ×2 IMPLANT
COVER SURGICAL LIGHT HANDLE (MISCELLANEOUS) ×2 IMPLANT
CUTTER LINEAR ENDO ART 45 ETS (STAPLE) ×2 IMPLANT
DERMABOND ADVANCED (GAUZE/BANDAGES/DRESSINGS)
DERMABOND ADVANCED .7 DNX12 (GAUZE/BANDAGES/DRESSINGS) IMPLANT
DEVICE SUTURE ENDOST 10MM (ENDOMECHANICALS) ×2 IMPLANT
DISSECTOR BLUNT TIP ENDO 5MM (MISCELLANEOUS) IMPLANT
DRAIN CHANNEL 19F RND (DRAIN) ×1 IMPLANT
DRAIN PENROSE 18X1/4 LTX STRL (WOUND CARE) ×2 IMPLANT
DRAPE CAMERA CLOSED 9X96 (DRAPES) ×2 IMPLANT
DRAPE UTILITY 15X26 (DRAPE) ×1 IMPLANT
ELECT REM PT RETURN 9FT ADLT (ELECTROSURGICAL) ×2
ELECTRODE REM PT RTRN 9FT ADLT (ELECTROSURGICAL) ×1 IMPLANT
EVACUATOR DRAINAGE 7X20 100CC (MISCELLANEOUS) IMPLANT
EVACUATOR SILICONE 100CC (MISCELLANEOUS) ×2
GAUZE SPONGE 2X2 8PLY STRL LF (GAUZE/BANDAGES/DRESSINGS) IMPLANT
GAUZE SPONGE 4X4 16PLY XRAY LF (GAUZE/BANDAGES/DRESSINGS) ×2 IMPLANT
GLOVE BIOGEL PI IND STRL 7.0 (GLOVE) ×1 IMPLANT
GLOVE BIOGEL PI IND STRL 7.5 (GLOVE) IMPLANT
GLOVE BIOGEL PI INDICATOR 7.0 (GLOVE) ×3
GLOVE BIOGEL PI INDICATOR 7.5 (GLOVE) ×1
GLOVE SS BIOGEL STRL SZ 7.5 (GLOVE) IMPLANT
GLOVE SUPERSENSE BIOGEL SZ 7.5 (GLOVE) ×1
GLOVE SURG SS PI 6.5 STRL IVOR (GLOVE) ×7 IMPLANT
GLOVE SURG SS PI 7.5 STRL IVOR (GLOVE) ×2 IMPLANT
GOWN STRL NON-REIN LRG LVL3 (GOWN DISPOSABLE) ×6 IMPLANT
GOWN STRL REIN XL XLG (GOWN DISPOSABLE) ×5 IMPLANT
HEMOSTAT SURGICEL 4X8 (HEMOSTASIS) IMPLANT
HOVERMATT SINGLE USE (MISCELLANEOUS) ×2 IMPLANT
IV LACTATED RINGER IRRG 3000ML (IV SOLUTION) ×2
IV LR IRRIG 3000ML ARTHROMATIC (IV SOLUTION) IMPLANT
KIT BASIN OR (CUSTOM PROCEDURE TRAY) ×2 IMPLANT
KIT GASTRIC LAVAGE 34FR ADT (SET/KITS/TRAYS/PACK) ×2 IMPLANT
NDL SPNL 22GX3.5 QUINCKE BK (NEEDLE) ×1 IMPLANT
NEEDLE SPNL 22GX3.5 QUINCKE BK (NEEDLE) ×2 IMPLANT
NS IRRIG 1000ML POUR BTL (IV SOLUTION) ×2 IMPLANT
PACK CARDIOVASCULAR III (CUSTOM PROCEDURE TRAY) ×2 IMPLANT
PACK UNIVERSAL I (CUSTOM PROCEDURE TRAY) ×1 IMPLANT
PEN SKIN MARKING BROAD (MISCELLANEOUS) ×2 IMPLANT
POUCH SPECIMEN RETRIEVAL 10MM (ENDOMECHANICALS) IMPLANT
RELOAD 45 VASCULAR/THIN (ENDOMECHANICALS) ×4 IMPLANT
RELOAD BLUE (STAPLE) ×4 IMPLANT
RELOAD ENDO STITCH 2.0 (ENDOMECHANICALS) ×28
RELOAD GOLD (STAPLE) ×2 IMPLANT
RELOAD GREEN (STAPLE) ×5 IMPLANT
RELOAD STAPLE 45 2.5 WHT GRN (ENDOMECHANICALS) ×2 IMPLANT
RELOAD STAPLE 45 3.5 BLU ETS (ENDOMECHANICALS) ×1 IMPLANT
RELOAD STAPLE TA45 3.5 REG BLU (ENDOMECHANICALS) ×2 IMPLANT
RELOAD SUT SNGL STCH ABSRB 2-0 (ENDOMECHANICALS) ×5 IMPLANT
RELOAD SUT SNGL STCH BLK 2-0 (ENDOMECHANICALS) ×3 IMPLANT
RELOAD WHITE ECR60W (STAPLE) IMPLANT
SCALPEL HARMONIC ACE (MISCELLANEOUS) ×2 IMPLANT
SCISSORS LAP 5X35 DISP (ENDOMECHANICALS) ×2 IMPLANT
SEALANT SURGICAL APPL DUAL CAN (MISCELLANEOUS) ×3 IMPLANT
SET IRRIG TUBING LAPAROSCOPIC (IRRIGATION / IRRIGATOR) ×2 IMPLANT
SLEEVE ADV FIXATION 12X100MM (TROCAR) ×4 IMPLANT
SLEEVE ADV FIXATION 5X100MM (TROCAR) ×2 IMPLANT
SOLUTION ANTI FOG 6CC (MISCELLANEOUS) ×2 IMPLANT
SPONGE GAUZE 2X2 STER 10/PKG (GAUZE/BANDAGES/DRESSINGS) ×3
SPONGE GAUZE 4X4 12PLY (GAUZE/BANDAGES/DRESSINGS) ×1 IMPLANT
STAPLER ECHELON FLEX (STAPLE) ×1 IMPLANT
STAPLER STANDARD HANDLE (STAPLE) ×1 IMPLANT
STAPLER VISISTAT 35W (STAPLE) ×2 IMPLANT
STRIP CLOSURE SKIN 1/2X4 (GAUZE/BANDAGES/DRESSINGS) IMPLANT
SUT ETHILON 2 0 PS N (SUTURE) ×1 IMPLANT
SUT ETHILON 3 0 PS 1 (SUTURE) IMPLANT
SUT MNCRL AB 4-0 PS2 18 (SUTURE) ×2 IMPLANT
SUT NYLON 3 0 (SUTURE) ×1 IMPLANT
SUT RELOAD ENDO STITCH 2 48X1 (ENDOMECHANICALS) ×8
SUT RELOAD ENDO STITCH 2.0 (ENDOMECHANICALS) ×6
SUT VIC AB 2-0 SH 27 (SUTURE) ×6
SUT VIC AB 2-0 SH 27X BRD (SUTURE) ×1 IMPLANT
SUTURE RELOAD END STTCH 2 48X1 (ENDOMECHANICALS) ×8 IMPLANT
SUTURE RELOAD ENDO STITCH 2.0 (ENDOMECHANICALS) ×6 IMPLANT
SYR 20CC LL (SYRINGE) ×2 IMPLANT
SYR 50ML LL SCALE MARK (SYRINGE) ×2 IMPLANT
SYR CONTROL 10ML LL (SYRINGE) ×2 IMPLANT
TAPE CLOTH SURG 4X10 WHT LF (GAUZE/BANDAGES/DRESSINGS) ×1 IMPLANT
TOWEL OR 17X26 10 PK STRL BLUE (TOWEL DISPOSABLE) ×2 IMPLANT
TRAY FOLEY CATH 14FRSI W/METER (CATHETERS) ×2 IMPLANT
TROCAR ADV FIXATION 12X100MM (TROCAR) ×2 IMPLANT
TROCAR XCEL 12X100 BLDLESS (ENDOMECHANICALS) ×2 IMPLANT
TROCAR Z-THREAD FIOS 5X100MM (TROCAR) ×2 IMPLANT
TUBING ENDO SMARTCAP (MISCELLANEOUS) ×2 IMPLANT
TUBING FILTER THERMOFLATOR (ELECTROSURGICAL) ×2 IMPLANT
WATER STERILE IRR 1500ML POUR (IV SOLUTION) ×3 IMPLANT
WATER STERILE IRR 500ML POUR (IV SOLUTION) ×1 IMPLANT

## 2011-10-05 NOTE — H&P (View-Only) (Signed)
Subjective:   Morbid obesity  Patient ID: Dominique Evans, female   DOB: 06/21/1966, 45 y.o.   MRN: [id number]  HPI Patient returns to the office prior to planned laparoscopic Roux-en-Y gastric bypass. She has a many year history of progressive morbid obesity. She has a history of LAP-BAND placement with vagotomy and 2007 with initially excellent weight loss but developed progressive esophageal dilatation that did not respond to fluid removal for band and subsequently had laparoscopic removal of her port and band a number of months ago. She has had recurrence of morbid obesity since and after extensive preoperative discussion regarding alternatives and risks we plan to proceed with laparoscopic Roux-en-Y gastric bypass. We again repeated the procedure. She's been over the consent form line by line and all her questions were answered. Past Medical History  Diagnosis Date  . Unspecified essential hypertension   . Hyperlipidemia   . GERD (gastroesophageal reflux disease)   . 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 Chronic constipation   . Nephrolithiasis   . Obesity   . Neuropathy   . Asthma     PFT 08/12/07: FEV1 2.97 (104%), FEV1% 84, TLC 4.89 (90%), DLCO 84%, +BD  . Allergic rhinitis   . Panic attacks   . Hydatidiform mole   . Diabetes mellitus   . Visual disturbance    Past Surgical History  Procedure Date  . Nephrectomy 2002    left  . Elbow fracture surgery     left  . Shoulder arthroscopy     right shoulder  . Laparoscopic gastric banding 02/2006    w/ truncal vagotomy  . Patent ductus arterious repair   . Lap band removal     Current Outpatient Prescriptions  Medication Sig Dispense Refill  . amitriptyline (ELAVIL) 25 MG tablet Take 1 tablet by mouth once daily       . Ascorbic Acid (VITAMIN C) 1000 MG tablet Take 1,000 mg by mouth daily.        . BAYER CONTOUR TEST test strip       . desvenlafaxine (PRISTIQ) 50 MG 24 hr tablet  Take by mouth daily.        . dexlansoprazole (DEXILANT) 60 MG capsule Take by mouth daily.        . ferrous fumarate (HEMOCYTE - 106 MG FE) 325 (106 FE) MG TABS Take 2 tablets by mouth.        . furosemide (LASIX) 20 MG tablet Take by mouth daily.        . losartan (COZAAR) 50 MG tablet       . metFORMIN (GLUMETZA) 500 MG (MOD) 24 hr tablet Take 500 mg by mouth 2 (two) times daily with a meal.        . montelukast (SINGULAIR) 10 MG tablet Take 1 tablet by mouth once daily       . Multiple Vitamins-Minerals (MULTIVITAMIN PO) Take by mouth daily.        . polyethylene glycol (MIRALAX) powder Mix 1 capful in 8 ounces of water daily       . PROAIR HFA 108 (90 BASE) MCG/ACT inhaler       . rosuvastatin (CRESTOR) 10 MG tablet Take 5 mg by mouth daily.       . verapamil (COVERA HS) 240 MG (CO) 24 hr tablet Take 1 tablet by mouth once daily       . budesonide (PULMICORT) 0.5 MG/2ML nebulizer solution 1   vial in nebulizer twice daily        Allergies  Allergen Reactions  . Penicillins Anaphylaxis  . Sulfonamide Derivatives Anaphylaxis   History  Substance Use Topics  . Smoking status: Former Smoker -- 2.0 packs/day    Types: Cigarettes    Quit date: 10/19/1982  . Smokeless tobacco: Never Used  . Alcohol Use: No     Review of Systems  Constitutional: Negative.   Respiratory: Negative.   Cardiovascular: Negative.        Objective:   Physical Exam General: Alert, well-developed morbidly obese female, in no distress Skin: Warm and dry without rash or infection. HEENT: No palpable masses or thyromegaly. Sclera nonicteric. Pupils equal round and reactive. Oropharynx clear. Lymph nodes: No cervical, supraclavicular, or inguinal nodes palpable. Lungs: Breath sounds clear and equal without increased work of breathing Cardiovascular: Regular rate and rhythm without murmur. No JVD or edema. Peripheral pulses intact. Abdomen: Nondistended. Soft and nontender. No masses palpable. No  organomegaly. No palpable hernias. Extremities: No edema or joint swelling or deformity. No chronic venous stasis changes. Neurologic: Alert and fully oriented. Gait normal.     Assessment:     45-year-old female with progressive morbid obesity status post failed LAP-BAND secondary to esophageal dilatation. We plan to proceed with laparoscopic Roux-en-Y gastric bypass.    Plan:     Laparoscopic Roux-en-Y gastric bypass.      

## 2011-10-05 NOTE — Op Note (Signed)
Preoperative Diagnosis: morbid obesity   Postoprative Diagnosis: morbid obesity   Procedure: Procedure(s): LAPAROSCOPIC ROUX-EN-Y GASTRIC Bypass   Surgeon: Glenna Fellows T   Assistants: Lodema Pilot M.D.  Anesthesia:  General endotracheal anesthesiaDiagnos  Indications: patient is a 45 year old female with a long history of morbid obesity. She is status post placement of lap band with truncal vagotomy in approximately 2006. She had repeated episodes of esophageal dilatation with attempt to adjust her lap band and her band was removed approximately one year ago. She has had a weight regain and has multiple comorbidities including diabetes mellitus and hypertension and presents with a BMI of 44. She desires revision to gastric bypass. We have discussed this procedure at great length including increased risk due to her previous surgery including but not limited to bleeding, leak, infection, and bowel obstruction. Complete consent as detailed elsewhere. Understanding the risks and desires to proceed and was brought to the operating room for laparoscopic Roux-en-Y gastric bypass.    Procedure Detail:  Patient is brought to the operating room, placed in the supine position on the operating table and general endotracheal anesthesia induced. The abdomen was widely sterilely prepped and draped in the correct patient and procedure verified. She received preoperative IV antibiotics and subcutaneous heparin. Access was obtained with a 12 mm Optiview trocar in the left subcostal space mid clavicular line without difficulty and pneumoperitoneum established. There were some omental adhesions up to a previous laparoscopic sites. A 5 mm trocar was placed laterally in the left abdomen and these omental adhesions were taken down with the harmonic scalpel. The liver was noted to be fatty and enlarged. I elected to approach the gastric pouch initially. Through a 5 mm subxiphoid site the [facility] retractor was  placed. There were noted to be fairly extensive adhesions between the liver and the upper stomach. 2 further 12 mm trochars were placed in the right upper quadrant at the midclavicular line and more laterally. An additional 11 mm port was placed just to the left of the umbilicus for the camera. The adhesions of the liver to the stomach were taken down with harmonic scalpel and sharp dissection. The left lobe of the liver was elevated up off of the stomach. The dissection was tedious but progressed adequately. As we got up around the hiatus the adhesions were particularly dense. We found the previous imbrication sutures from the lap band and identified the previous lap band tract. These imbrication sutures were taken down and the fundus mobilized off the extreme upper stomach taking down the imbrication. I did not dissect the very dense adhesions right along the right crus and the hiatus. We did mobilize the fundus off of the left crus and identify it very near the EG junction. At this point beginning about 5 cm distal to the hiatus a relatively healthy and normal appearing area of lesser curve was chosen and with careful harmonic scalpel dissection staying on the gastric wall the lesser sac was entered without great difficulty. We dissected somewhat to the posterior wall of the stomach up toward the target at the left crus. An initial firing of a green load echelon 60 mm stapler was performed about 4 cm across the stomach. We then used several more firings of the green load 60 mm stapler to create a tubular pouch up toward the left crus. The pouch was a little longer than usual to avoid the thickened and scarred area of the proximal stomach.we then opened a window in cleared the stomach off  the left crus and before firing the final staple load attempted to pass the Ewald tube orally but it would not pass beyond about 40 cm. Therefore Dr. Darylene Price went above and performed upper endoscopy and was able to advance the  endoscope into the gastric pouch.  There was noted to be an approximately 4-5 cm hiatal hernia which likely was making it difficult to pass the Ewald tube. With the endoscope in place the final staple firing was used creating the pouch and was seen to be completely separate from the gastric remnant. The remnant staple line was oversewn with a running 2-0 silk. We then went below to create the jejunojejunostomy. The omentum was very heavy and thick as was the colon mesentery and it was actually somewhat difficult to locate the ligament of Treitz but it was identified and a 40 cm biliopancreatic limb was measured which had good mobility of over the colon. The small bowel was divided here with a single firing of the 60 mm white load stapler. A Penrose drain was sutured to the end of the Roux limb. A 100 cm Roux limb was then measured. A JJ anastomosis was then created side to side near the end of the biliopancreatic limb to the Roux limb with a single firing of the 45 mm white load stapler. The common enterotomy was closed from either end with running 2-0 Vicryl. The mesenteric defect was closed with a running 2-0 silk. The sutured staple lines were coated with Tisseel. The omentum was then divided with the harmonic scalpel up toward the colon near the midline to allow greater mobility of the Roux limb. The endoscopy had insufflated the gastric remnant prior to firing the final staple load to the point that it was essentially impossible to bring the Roux limb up over the gastric remnant without undue tension. We therefore made a small gastrotomy with the harmonic scalpel along the greater curve of the remnant and it was decompressed. This gastrotomy was closed with a figure-of-eight suture of 0 silk. We considered we might use this site for a gastrostomy tube if we have difficulty with the anastomosis. Following this the Roux limb could be brought up to the gastric pouch without undue tension. The gastrojejunostomy was  created with an initial posterior row of running 2-0 Vicryl. Small enterotomies were made in the gastric pouch and the Roux limb with the harmonic scalpel and using these a 45 mm blue load linear stapler was used to create an approximately 2-2-1/2 cm anastomosis. The staple line was inspected and was intact and without bleeding. The common enterotomy was closed with running 2-0 Vicryl from either end.  The endoscope which had been left in place in the upper gastric pouch was then passed down through the anastomosis and an anterior row of seromuscular 2-0 Vicryl was placed. Following this with the jejunum clamped just beneath the gastric pouch Dr. Darylene Price went above and insufflated the pouch tensely with air and there was no evidence of leak under saline irrigation. The anastomosis was patent and sutured staple lines were intact without bleeding. The pouch was then desufflated and the scope removed. I elected to leave a closed suction drain in Morison's pouch and over the anastomosis and a 19 Blake was placed and brought out through the left lateral trocar site. The abdomen was inspected for hemostasis or any evidence of bowel injury and everything looked fine. The Nathanson retractor was removed. All CO2 was evacuated and trochars removed. Skin incisions were closed with  staples. Sponge needle and instrument counts were correct. The patient was taken to the PACU in good condition.    Estimated Blood Loss:  less than 100 mL         Drains: JACKSON-PRATT (JP)  Blood Given: none          Specimens: None        Complications:  * No complications entered in OR log *         Disposition: PACU - hemodynamically stable.         Condition: stable  Mariella Saa MD, FACS  10/05/2011, 9:19 PM

## 2011-10-05 NOTE — Preoperative (Signed)
Beta Blockers   Reason not to administer Beta Blockers:Not Applicable 

## 2011-10-05 NOTE — Anesthesia Postprocedure Evaluation (Signed)
  Anesthesia Post-op Note  Patient: Dominique Evans  Procedure(s) Performed:  LAPAROSCOPIC ROUX-EN-Y GASTRIC - UPPER ENDOSCOPY  Patient Location: PACU  Anesthesia Type: General  Level of Consciousness: oriented and sedated  Airway and Oxygen Therapy: Patient Spontanous Breathing and Patient connected to nasal cannula oxygen  Post-op Pain: mild  Post-op Assessment: Post-op Vital signs reviewed, Patient's Cardiovascular Status Stable, Respiratory Function Stable and Patent Airway  Post-op Vital Signs: stable  Complications: No apparent anesthesia complications

## 2011-10-05 NOTE — Transfer of Care (Signed)
Immediate Anesthesia Transfer of Care Note  Patient: Dominique Evans  Procedure(s) Performed:  LAPAROSCOPIC ROUX-EN-Y GASTRIC - UPPER ENDOSCOPY  Patient Location: PACU  Anesthesia Type: General  Level of Consciousness: awake, oriented, patient cooperative, lethargic and responds to stimulation  Airway & Oxygen Therapy: Patient Spontanous Breathing and Patient connected to face mask oxygen  Post-op Assessment: Report given to PACU RN, Post -op Vital signs reviewed and stable and Patient moving all extremities X 4  Post vital signs: Reviewed and stable  Complications: No apparent anesthesia complications

## 2011-10-05 NOTE — Anesthesia Preprocedure Evaluation (Addendum)
Anesthesia Evaluation  Patient identified by MRN, date of birth, ID band Patient awake    Reviewed: Allergy & Precautions, H&P , NPO status , Patient's Chart, lab work & pertinent test results  History of Anesthesia Complications (+) PONV  Airway Mallampati: II TM Distance: >3 FB Neck ROM: full    Dental No notable dental hx. (+) Teeth Intact and Dental Advisory Given   Pulmonary neg pulmonary ROS, asthma ,  Mild to mod. asthma clear to auscultation  Pulmonary exam normal       Cardiovascular Exercise Tolerance: Good hypertension, On Medications neg cardio ROS regular Normal    Neuro/Psych PSYCHIATRIC DISORDERS Negative Neurological ROS     GI/Hepatic negative GI ROS, Neg liver ROS, GERD-  Medicated and Controlled,  Endo/Other  Negative Endocrine ROSDiabetes mellitus-, Well Controlled, Type 2, Oral Hypoglycemic AgentsMorbid obesity  Renal/GU negative Renal ROS  Genitourinary negative   Musculoskeletal   Abdominal   Peds  Hematology negative hematology ROS (+)   Anesthesia Other Findings   Reproductive/Obstetrics negative OB ROS                          Anesthesia Physical Anesthesia Plan  ASA: III  Anesthesia Plan: General   Post-op Pain Management:    Induction: Intravenous  Airway Management Planned: Oral ETT  Additional Equipment:   Intra-op Plan:   Post-operative Plan:   Informed Consent: I have reviewed the patients History and Physical, chart, labs and discussed the procedure including the risks, benefits and alternatives for the proposed anesthesia with the patient or authorized representative who has indicated his/her understanding and acceptance.   Dental Advisory Given  Plan Discussed with: CRNA and Surgeon  Anesthesia Plan Comments:         Anesthesia Quick Evaluation

## 2011-10-05 NOTE — Interval H&P Note (Signed)
History and Physical Interval Note:  10/05/2011 2:55 PM  Dominique Evans  has presented today for surgery, with the diagnosis of morbid obesity   The various methods of treatment have been discussed with the patient and family. After consideration of risks, benefits and other options for treatment, the patient has consented to  Procedure(s): LAPAROSCOPIC ROUX-EN-Y GASTRIC as a surgical intervention .  The patients' history has been reviewed, patient examined, no change in status, stable for surgery.  I have reviewed the patients' chart and labs.  Questions were answered to the patient's satisfaction.     Lizett Chowning T

## 2011-10-06 ENCOUNTER — Inpatient Hospital Stay (HOSPITAL_COMMUNITY): Payer: Managed Care, Other (non HMO)

## 2011-10-06 DIAGNOSIS — Z9889 Other specified postprocedural states: Secondary | ICD-10-CM

## 2011-10-06 LAB — GLUCOSE, CAPILLARY
Glucose-Capillary: 125 mg/dL — ABNORMAL HIGH (ref 70–99)
Glucose-Capillary: 135 mg/dL — ABNORMAL HIGH (ref 70–99)
Glucose-Capillary: 150 mg/dL — ABNORMAL HIGH (ref 70–99)
Glucose-Capillary: 170 mg/dL — ABNORMAL HIGH (ref 70–99)
Glucose-Capillary: 201 mg/dL — ABNORMAL HIGH (ref 70–99)

## 2011-10-06 LAB — BASIC METABOLIC PANEL
BUN: 9 mg/dL (ref 6–23)
CO2: 25 mEq/L (ref 19–32)
Calcium: 8.8 mg/dL (ref 8.4–10.5)
Creatinine, Ser: 0.68 mg/dL (ref 0.50–1.10)
Glucose, Bld: 160 mg/dL — ABNORMAL HIGH (ref 70–99)
Potassium: 4.2 mEq/L (ref 3.5–5.1)
Sodium: 135 mEq/L (ref 135–145)

## 2011-10-06 LAB — DIFFERENTIAL
Basophils Absolute: 0 10*3/uL (ref 0.0–0.1)
Eosinophils Relative: 0 % (ref 0–5)
Lymphocytes Relative: 10 % — ABNORMAL LOW (ref 12–46)
Lymphs Abs: 1.1 10*3/uL (ref 0.7–4.0)
Monocytes Absolute: 0.7 10*3/uL (ref 0.1–1.0)
Monocytes Relative: 6 % (ref 3–12)
Neutro Abs: 9.5 10*3/uL — ABNORMAL HIGH (ref 1.7–7.7)

## 2011-10-06 LAB — CBC
HCT: 38 % (ref 36.0–46.0)
MCV: 77.7 fL — ABNORMAL LOW (ref 78.0–100.0)
Platelets: 235 10*3/uL (ref 150–400)
RBC: 4.89 MIL/uL (ref 3.87–5.11)
WBC: 11.3 10*3/uL — ABNORMAL HIGH (ref 4.0–10.5)

## 2011-10-06 MED ORDER — IOHEXOL 300 MG/ML  SOLN
50.0000 mL | Freq: Once | INTRAMUSCULAR | Status: AC | PRN
  Administered 2011-10-06: 50 mL via ORAL

## 2011-10-06 MED ORDER — PROMETHAZINE HCL 25 MG/ML IJ SOLN
12.5000 mg | Freq: Four times a day (QID) | INTRAMUSCULAR | Status: DC | PRN
  Administered 2011-10-06: 12.5 mg via INTRAVENOUS
  Filled 2011-10-06: qty 1

## 2011-10-06 NOTE — Progress Notes (Signed)
Pt alert and oriented; VSS with exception of increased HR; CBG's trending down; denies nausea or vomiting at this time; remains NPO until after swallow study; doppler study complete; ambulating and voiding without difficulty; taking prn pain meds with relief; c/o sore throat d/t being dry and thirsty; encouraged ambulation and pulmonary toilet and pt verbalized understanding of; discharge instructions given to pt to review and will complete tomorrow. Talmadge Chad, RN Bariatric Nurse Coordinator

## 2011-10-06 NOTE — Progress Notes (Signed)
Patient ID: Dominique Evans, female   DOB: 24-Feb-1966, 46 y.o.   MRN: [id number] 1 Day Post-Op  Subjective: Up in chair, mild pain controlled with meds. No nausea currently  Objective: Vital signs in last 24 hours: Temp:  [97.3 F (36.3 C)-98.9 F (37.2 C)] 98.9 F (37.2 C) (12/18 0600) Pulse Rate:  [88-112] 112  (12/18 0600) Resp:  [12-16] 16  (12/18 0600) BP: (131-157)/(57-92) 157/86 mmHg (12/18 0600) SpO2:  [95 %-100 %] 97 % (12/18 0600) Weight:  [271 lb 2 oz (122.981 kg)] 271 lb 2 oz (122.981 kg) (12/17 1348)    Intake/Output from previous day: 12/17 0701 - 12/18 0700 In: 3425 [I.V.:3425] Out: 470 [Urine:295; Drains:150; Blood:25] Intake/Output this shift:    General appearance: alert and no distress Resp: clear to auscultation bilaterally GI: abnormal findings:  mild tenderness in the epigastrium Incision/Wound:Clean and dry.  JP serosanguinous  Lab Results:   Basename 10/06/11 0624 10/05/11 2130  WBC 11.3* --  HGB 12.5 11.5*  HCT 38.0 35.2*  PLT 235 --   BMET  Basename 10/06/11 0624  NA 135  K 4.2  CL 101  CO2 25  GLUCOSE 160*  BUN 9  CREATININE 0.68  CALCIUM 8.8   PT/INR No results found for this basename: LABPROT:2,INR:2 in the last 72 hours ABG No results found for this basename: PHART:2,PCO2:2,PO2:2,HCO3:2 in the last 72 hours  Studies/Results: No results found.  Anti-infectives: Anti-infectives     Start     Dose/Rate Route Frequency Ordered Stop   10/05/11 1400   moxifloxacin (AVELOX) IVPB 400 mg        400 mg 250 mL/hr over 60 Minutes Intravenous 60 min pre-op 10/05/11 1351 10/05/11 1520          Assessment/Plan: s/p Procedure(s): LAPAROSCOPIC ROUX-EN-Y GASTRIC Stable post op.  Mild tachycardia but otherwise appears well.  Await swallow study.  Pulmonary toilet and mobilize   LOS: 1 day    Thereasa Iannello T 10/06/2011

## 2011-10-06 NOTE — Progress Notes (Signed)
Bilateral lower extremity venous duplex completed at 09:15.  Preliminary report is negative for DVT, SVT, or a Baker's cyst. Dominique Evans 10/06/2011, 9:41 AM

## 2011-10-07 ENCOUNTER — Encounter (HOSPITAL_COMMUNITY): Payer: Self-pay | Admitting: General Surgery

## 2011-10-07 LAB — GLUCOSE, CAPILLARY
Glucose-Capillary: 101 mg/dL — ABNORMAL HIGH (ref 70–99)
Glucose-Capillary: 116 mg/dL — ABNORMAL HIGH (ref 70–99)
Glucose-Capillary: 133 mg/dL — ABNORMAL HIGH (ref 70–99)
Glucose-Capillary: 136 mg/dL — ABNORMAL HIGH (ref 70–99)
Glucose-Capillary: 139 mg/dL — ABNORMAL HIGH (ref 70–99)

## 2011-10-07 LAB — DIFFERENTIAL
Basophils Absolute: 0 10*3/uL (ref 0.0–0.1)
Basophils Relative: 0 % (ref 0–1)
Eosinophils Relative: 1 % (ref 0–5)
Lymphocytes Relative: 15 % (ref 12–46)
Monocytes Relative: 7 % (ref 3–12)
Neutro Abs: 10.3 10*3/uL — ABNORMAL HIGH (ref 1.7–7.7)
Neutrophils Relative %: 77 % (ref 43–77)

## 2011-10-07 LAB — CBC
Hemoglobin: 12.5 g/dL (ref 12.0–15.0)
MCHC: 32.1 g/dL (ref 30.0–36.0)
RBC: 4.91 MIL/uL (ref 3.87–5.11)

## 2011-10-07 NOTE — Progress Notes (Signed)
Pt alert and oriented; still tachycardic but otherwise VSS; pt sitting up in chair; verbalized feeling much better today; doppler study normal and swallow study WNL; denies nausea or vomiting; ambulating well; voiding well; denies passing gas at this point; encouraged ambulation and pulmonary toilet and pt verbalized understanding of; pain controlled by prn pain meds; tolerating sips of water well; continue current plan of care. Talmadge Chad, RN Bariatric Nurse coordinator

## 2011-10-07 NOTE — Progress Notes (Signed)
Patient ID: Dominique Evans, female   DOB: 1966/05/02, 45 y.o.   MRN: [id number] 2 Days Post-Op  Subjective: She feels better today. Moderate epigastric pain relieved by medications and improved from yesterday. She has not had nausea and is tolerating ice chips without difficulty. She has been up walking in the halls.  Objective: Vital signs in last 24 hours: Temp:  [97.7 F (36.5 C)-99.5 F (37.5 C)] 97.7 F (36.5 C) (12/19 1000) Pulse Rate:  [109-124] 109  (12/19 1000) Resp:  [20] 20  (12/19 1000) BP: (145-169)/(80-105) 153/100 mmHg (12/19 1000) SpO2:  [93 %-99 %] 97 % (12/19 1000) Last BM Date: 10/05/11  Intake/Output from previous day: 12/18 0701 - 12/19 0700 In: 75 [P.O.:75] Out: 555 [Urine:500; Drains:55] Intake/Output this shift:    General appearance: alert and no distress GI: abnormal findings:  mild tenderness in the LUQ without guarding Incision/Wound: clean and dry without signs of infection  Lab Results:   Basename 10/07/11 0402 10/06/11 0624  WBC 13.3* 11.3*  HGB 12.5 12.5  HCT 38.9 38.0  PLT 254 235   BMET  Basename 10/06/11 0624  NA 135  K 4.2  CL 101  CO2 25  GLUCOSE 160*  BUN 9  CREATININE 0.68  CALCIUM 8.8   PT/INR No results found for this basename: LABPROT:2,INR:2 in the last 72 hours ABG No results found for this basename: PHART:2,PCO2:2,PO2:2,HCO3:2 in the last 72 hours  Studies/Results: Dg Ugi W/water Sol Cm  10/06/2011  *RADIOLOGY REPORT*  Clinical Data:Postop day #1 from gastric bypass surgery.  Known chronic esophageal dilatation  WATER SOLUBLE UPPER GI SERIES  Technique:  Single-column upper GI series was performed using water soluble contrast.  Fluoroscopy Time: 2.1 minutes  Comparison: None.  Findings: The scout radiograph shows a a normal bowel gas pattern. Surgical drain is seen in the left upper quadrant as well as multiple surgical staples and clips.  The thoracic esophagus shows moderate dilatation.  Some contrast passes into  the gastric pouch, and there is no evidence of distal esophageal obstruction or contrast leak.  The majority of the 50 ml of Omnipaque which was administered through the in the dilated distal esophagus due to passive esophageal dilatation, despite the fact that the patient was placed in both semi-erect and erect positions.  The entire gastric pouch was fairly well opacified.  A tiny amount of contrast passed into the efferent small bowel, and no contrast leak or extravasation was demonstrated. The efferent small bowel was poorly opacified however, and not well evaluated on this exam. These findings were discussed with Dr. Johna Sheriff by telephone, and the exam was discontinued for today.  IMPRESSION:  1.  Chronic esophageal dilatation, with esophageal stasis resulting in limited opacification of efferent small bowel. 2.  No evidence of contrast leak at the gastric pouch.  No evidence of obstruction.  Efferent small bowel was not well visualized on this study.  Original Report Authenticated By: Danae Orleans, M.D.    Anti-infectives: Anti-infectives     Start     Dose/Rate Route Frequency Ordered Stop   10/05/11 1400   moxifloxacin (AVELOX) IVPB 400 mg        400 mg 250 mL/hr over 60 Minutes Intravenous 60 min pre-op 10/05/11 1351 10/05/11 1520          Assessment/Plan: s/p Procedure(s): LAPAROSCOPIC ROUX-EN-Y GASTRIC Doing reasonably well on postop day #2. She is much more comfortable today. She has intermittent mild tachycardia and slightly elevated white count but a benign  abdominal exam and clear J-P drainage. A swallow study was not ideal do to her esophageal dilatation but there was no evidence of leak. Will start 2 ounces of water by mouth every 4 hours.   LOS: 2 days    Adham Johnson T 10/07/2011

## 2011-10-08 LAB — CBC
HCT: 34.2 % — ABNORMAL LOW (ref 36.0–46.0)
Hemoglobin: 11 g/dL — ABNORMAL LOW (ref 12.0–15.0)
MCH: 25.6 pg — ABNORMAL LOW (ref 26.0–34.0)
MCHC: 32.2 g/dL (ref 30.0–36.0)
MCV: 79.7 fL (ref 78.0–100.0)
Platelets: 208 10*3/uL (ref 150–400)
RBC: 4.29 MIL/uL (ref 3.87–5.11)
RDW: 17.5 % — ABNORMAL HIGH (ref 11.5–15.5)
WBC: 9.6 10*3/uL (ref 4.0–10.5)

## 2011-10-08 LAB — DIFFERENTIAL
Basophils Absolute: 0 10*3/uL (ref 0.0–0.1)
Basophils Relative: 0 % (ref 0–1)
Eosinophils Absolute: 0.3 10*3/uL (ref 0.0–0.7)
Eosinophils Relative: 3 % (ref 0–5)
Lymphocytes Relative: 19 % (ref 12–46)
Lymphs Abs: 1.9 10*3/uL (ref 0.7–4.0)
Monocytes Absolute: 0.7 10*3/uL (ref 0.1–1.0)
Monocytes Relative: 8 % (ref 3–12)
Neutro Abs: 6.7 10*3/uL (ref 1.7–7.7)
Neutrophils Relative %: 70 % (ref 43–77)

## 2011-10-08 LAB — GLUCOSE, CAPILLARY
Glucose-Capillary: 105 mg/dL — ABNORMAL HIGH (ref 70–99)
Glucose-Capillary: 112 mg/dL — ABNORMAL HIGH (ref 70–99)
Glucose-Capillary: 114 mg/dL — ABNORMAL HIGH (ref 70–99)
Glucose-Capillary: 131 mg/dL — ABNORMAL HIGH (ref 70–99)
Glucose-Capillary: 89 mg/dL (ref 70–99)
Glucose-Capillary: 90 mg/dL (ref 70–99)

## 2011-10-08 NOTE — Progress Notes (Signed)
Pt alert and oriented; denies nausea or vomiting; tolerating water well and will begin protein shakes; voiding without difficulty; ambulating in halls without difficulty; + BM today; verbalizes relief with prn pain meds Talmadge Chad, RN Bariatric Nurse Coordinator

## 2011-10-08 NOTE — Progress Notes (Signed)
Patient ID: Dominique Evans, female   DOB: 12/02/1965, 45 y.o.   MRN: [id number] 3 Days Post-Op  Subjective: Feels gradually better, slept all night.  Still some LUQ pain but improving. Tol water po without difficulty.  Pos BM  Objective: Vital signs in last 24 hours: Temp:  [97.7 F (36.5 C)-99.4 F (37.4 C)] 97.7 F (36.5 C) (12/20 0614) Pulse Rate:  [100-119] 112  (12/20 0614) Resp:  [18-20] 18  (12/20 0614) BP: (116-155)/(76-100) 123/78 mmHg (12/20 0614) SpO2:  [94 %-98 %] 96 % (12/20 0614) Last BM Date: 10/07/11  Intake/Output from previous day: 12/19 0701 - 12/20 0700 In: 30 [P.O.:30] Out: 40 [Drains:40] Intake/Output this shift:    General appearance: alert and no distress GI: abnormal findings:  mild tenderness in the LUQ Incision/Wound:Clean and dry. Serosanguinous JP drainage, 40 cc/8hr  Lab Results:   Basename 10/08/11 0350 10/07/11 0402  WBC 9.6 13.3*  HGB 11.0* 12.5  HCT 34.2* 38.9  PLT 208 254   BMET  Basename 10/06/11 0624  NA 135  K 4.2  CL 101  CO2 25  GLUCOSE 160*  BUN 9  CREATININE 0.68  CALCIUM 8.8   PT/INR No results found for this basename: LABPROT:2,INR:2 in the last 72 hours ABG No results found for this basename: PHART:2,PCO2:2,PO2:2,HCO3:2 in the last 72 hours  Studies/Results: Dg Ugi W/water Sol Cm  10/06/2011  *RADIOLOGY REPORT*  Clinical Data:Postop day #1 from gastric bypass surgery.  Known chronic esophageal dilatation  WATER SOLUBLE UPPER GI SERIES  Technique:  Single-column upper GI series was performed using water soluble contrast.  Fluoroscopy Time: 2.1 minutes  Comparison: None.  Findings: The scout radiograph shows a a normal bowel gas pattern. Surgical drain is seen in the left upper quadrant as well as multiple surgical staples and clips.  The thoracic esophagus shows moderate dilatation.  Some contrast passes into the gastric pouch, and there is no evidence of distal esophageal obstruction or contrast leak.  The majority  of the 50 ml of Omnipaque which was administered through the in the dilated distal esophagus due to passive esophageal dilatation, despite the fact that the patient was placed in both semi-erect and erect positions.  The entire gastric pouch was fairly well opacified.  A tiny amount of contrast passed into the efferent small bowel, and no contrast leak or extravasation was demonstrated. The efferent small bowel was poorly opacified however, and not well evaluated on this exam. These findings were discussed with Dr. Johna Sheriff by telephone, and the exam was discontinued for today.  IMPRESSION:  1.  Chronic esophageal dilatation, with esophageal stasis resulting in limited opacification of efferent small bowel. 2.  No evidence of contrast leak at the gastric pouch.  No evidence of obstruction.  Efferent small bowel was not well visualized on this study.  Original Report Authenticated By: Danae Orleans, M.D.    Anti-infectives: Anti-infectives     Start     Dose/Rate Route Frequency Ordered Stop   10/05/11 1400   moxifloxacin (AVELOX) IVPB 400 mg        400 mg 250 mL/hr over 60 Minutes Intravenous 60 min pre-op 10/05/11 1351 10/05/11 1520          Assessment/Plan: s/p Procedure(s): LAPAROSCOPIC ROUX-EN-Y GASTRIC Improving, less pain.  Still mild tachycardia but WBC normal.  Will start protein shakes PO   LOS: 3 days    Pablo Mathurin T 10/08/2011

## 2011-10-08 NOTE — Progress Notes (Signed)
Patient was just medicated for c/o 6/10 abd pain, see eMar, appears to be sleeping sitting up in chair, eyes closed, resp. Even and unlabored, no s/s of distress. Tolerated 2 oz. Of the bariatric shake for POD#2.

## 2011-10-09 LAB — GLUCOSE, CAPILLARY
Glucose-Capillary: 76 mg/dL (ref 70–99)
Glucose-Capillary: 77 mg/dL (ref 70–99)

## 2011-10-09 MED ORDER — OXYCODONE-ACETAMINOPHEN 5-325 MG/5ML PO SOLN: 5.0000 mL | ORAL | Status: DC | PRN

## 2011-10-09 NOTE — Progress Notes (Signed)
Pt alert and oriented; VSS; CBG's trending down; pt desires discharge today; tolerating protein shakes well; denies any nausea or vomiting; passing gas; voiding without difficulty; ambulating well; pain relieved with prn pain meds; awaiting MD rounds; discharge instructions reviewed and pt verbalized understanding of; has follow up appts with [facility] and [facility]; aware of BELT program and Support group. GASTRIC BYPASS/SLEEVE DISCHARGE INSTRUCTIONS  Drs. Fredrik Rigger, Hoxworth, Wilson, and Evans Call if you have any problems.   Call [phone] and ask for the surgeon on call.    If you need immediate assistance come to the ER at [facility]. Tell the ER personnel that you are a new post-op gastric bypass patient. Signs and symptoms to report:   Severe vomiting or nausea. If you cannot tolerate clear liquids for longer than 1 day, you need to call your surgeon.    Abdominal pain which does not get better after taking your pain medication   Fever greater than 101 F degree   Difficulty breathing   Chest pain    Redness, swelling, drainage, or foul odor at incision sites    If your incisions open or pull apart   Swelling or pain in calf (lower leg)   Diarrhea, frequent watery, uncontrolled bowel movements.   Constipation, (no bowel movements for 3 days) if this occurs, Take Milk of Magnesia, 2 tablespoons by mouth, 3 times a day for 2 days if needed.  Call your doctor if constipation continues. Stop taking Milk of Magnesia once you have had a bowel movement. You may also use Miralax according to the label instructions.   Anything you consider "abnormal for you".   Normal side effects after Surgery:   Unable to sleep at night or concentrate   Irritability   Being tearful (crying) or depressed   These are common complaints, possibly related to your anesthesia, stress of surgery and change in lifestyle, that usually go away a few weeks after surgery.  If these feelings continue, call your medical  doctor.  Wound Care You may have surgical glue, steri-strips, or staples over your incisions after surgery.  Surgical glue:  Looks like a clear film over your incisions and will wear off gradually. Steri-strips: Strips of tape over your incisions. You may notice a yellowish color on the skin underneath the steri-strips. This is a substance used to make the steri-strips stick better. Do not pull the steri-strips off - let them fall off.  Staples: Cherlynn Polo may be removed before you leave the hospital. If you go home with staples, call [facility] [phone]) for an appointment with your surgeon's nurse to have staples removed in 7 - 10 days. Showering: You may shower two days after your surgery unless otherwise instructed by your surgeon. Wash gently around wounds with warm soapy water, rinse well, and gently pat dry.  If you have a drain, you may need someone to hold this while you shower. Avoid tub baths until staples are removed and incisions are healed.    Medications   Medications should be liquid or crushed if larger than the size of a dime.  Extended release pills should not be crushed.   Depending on the size and number of medications you take, you may need to stagger/change the time you take your medications so that you do not over-fill your pouch.    Make sure you follow-up with your primary care physician to make medication adjustments needed during rapid weight loss and life-style adjustment.   If you are  diabetic, follow up with the doctor that prescribes your diabetes medication(s) within one week after surgery and check your blood sugar regularly.   Do not drive while taking narcotics!   Do not take acetaminophen (Tylenol) and Roxicet or Lortab Elixir at the same time since these pain medications contain acetaminophen.  Diet at home: (First 2 Weeks) You will see the nutritionist two weeks after your surgery. She will advance your diet if you are tolerating liquids  well. Once at home, if you have severe vomiting or nausea and cannot tolerate clear liquids lasting longer than 1 day, call your surgeon.  Begin high protein shake 2 ounces every 3 hours, 5 - 6 times per day.  Gradually increase the amount you drink as tolerated.  You may find it easier to slowly sip shakes throughout the day.  It is important to get your proteins in first.   Protein Shake   Drink at least 2 ounces of shake 5-6 times per day   Each serving of protein shakes should have a minimum of 15 grams of protein and no more than 5 grams of carbohydrate    Increase the amount of protein shake you drink as tolerated   Protein powder may be added to fluids such as non-fat milk or Lactaid milk (limit to 20 grams added protein powder per serving   The initial goal is to drink at least 8 ounces of protein shake/drink per day (or as directed by the nutritionist). Some examples of protein shakes are [employer], [employer], EAS Edge HP, and Unjury. Hydration   Gradually increase the amount of water and other liquids as tolerated (See Acceptable Fluids)   Gradually increase the amount of protein shake as tolerated     Sip fluids slowly and throughout the day   May use Sugar substitutes, use sparingly (limit to 6 - 8 packets per day). Your fluid goal is 64 ounces of fluid daily. It may take a few weeks to build up to this.         32 oz (or more) should be clear liquids and 32 oz (or more) should be full liquids.         Liquids should not contain sugar, caffeine, or carbonation! Acceptable Fluids Clear Liquids:   Water or Sugar-free flavored water, Fruit H2O   Decaffeinated coffee or tea (sugar-free)   Crystal Lite, Wyler's Lite, Minute Maid Lite   Sugar-free Jell-O   Bouillon or broth   Sugar-free Popsicle:   *Less than 20 calories each; Limit 1 per day   Full Liquids:              Protein Shakes/Drinks + 2 choices per day of other full liquids shown below.    Other full liquids  must be: No more than 12 grams of Carbs per serving,  No more than 3 grams of Fat per serving   Strained low-fat cream soup   Non-Fat milk   Fat-free Lactaid Milk   Sugar-free yogurt (Dannon Lite & Fit) Vitamins and Minerals (Start 1 day after surgery unless otherwise directed)   2 Chewable Multivitamin / Multimineral Supplement (i.e. Centrum for Adults)   Chewable Calcium Citrate with Vitamin D-3. Take 1500 mg each day.           (Example: 3 Chewable Calcium Plus 600 with Vitamin D-3 can be found at [facility])         Vitamin B-12, 350 - 500 micrograms (oral tablet) each day   Do  not mix multivitamins containing iron with calcium supplements; take 2 hours   apart   Do not substitute Tums (calcium carbonate) for your calcium   Menstruating women and those at risk for anemia may need extra iron. Talk with your doctor to see if you need additional iron.    If you need extra iron:  Total daily Iron recommendations (including Vitamins) = 50 - 100 mg Iron/day Do not stop taking or change any vitamins or minerals until you talk to your nutritionist or surgeon. Your nutritionist and / or physician must approve all vitamin and mineral supplements. Exercise For maximum success, begin exercising as soon as your doctor recommends. Make sure your physician approves any physical activity.   Depending on fitness level, begin with a simple walking program   Walk 5-15 minutes each day, 7 days per week.    Slowly increase until you are walking 30-45 minutes per day   Consider joining our BELT program. [phone] or email belt@[employer] .edu Things to remember:    You may have sexual relations when you feel comfortable. It is VERY important for female patients to use a reliable birth control method. Fertility often increases after surgery. Do not get pregnant for at least 18 months.   It is very important to keep all follow up appointments with your surgeon, nutritionist, primary care physician, and behavioral  health practitioner. After the first year, please follow up with your bariatric surgeon at least once a year in order to maintain best weight loss results.  [facility]: [phone] [facility]: [phone]   Free counseling is available for you and your family through collaboration between [facility] and [place]. Please call [phone] and leave a message.    Consider purchasing a medical alert bracelet that says you had gastric bypass surgery.    The [facility] has a free Bariatric Surgery Support Group that meets monthly, the 3rd Thursday, 6 pm, Classroom #1, [place]. You may register online at [email or web address], but registration is not necessary. Select Classes and Support Groups, Bariatric Surgery, or Call [phone]   Do not return to work or drive until cleared by your surgeon   Use your CPAP when sleeping if applicable   Do not lift anything greater than ten pounds for at least two weeks  Talmadge Chad, RN Bariatric Nurse Coordinator

## 2011-10-09 NOTE — Progress Notes (Signed)
Patient ID: Dominique Evans, female   DOB: 07-21-1966, 45 y.o.   MRN: [id number] 4 Days Post-Op  Subjective: No complaints today. Tolerating protein shakes without nausea or pain.  Objective: Vital signs in last 24 hours: Temp:  [97.7 F (36.5 C)-98.7 F (37.1 C)] 97.8 F (36.6 C) (12/21 0630) Pulse Rate:  [85-101] 86  (12/21 0630) Resp:  [18-20] 18  (12/21 0630) BP: (113-136)/(84-87) 133/85 mmHg (12/21 0630) SpO2:  [97 %-99 %] 97 % (12/21 0630) Last BM Date: 10/07/11  Intake/Output from previous day: 12/20 0701 - 12/21 0700 In: 4098 [I.V.:6988] Out: 45 [Drains:45] Intake/Output this shift:    General appearance: alert and no distress Resp: clear to auscultation bilaterally GI: normal findings: soft, non-tender Incision/Wound: wounds were clean and dry. JP drainage is serous sanguinous at 40 cc per day.  Lab Results:   Basename 10/08/11 0350 10/07/11 0402  WBC 9.6 13.3*  HGB 11.0* 12.5  HCT 34.2* 38.9  PLT 208 254   BMET No results found for this basename: NA:2,K:2,CL:2,CO2:2,GLUCOSE:2,BUN:2,CREATININE:2,CALCIUM:2 in the last 72 hours PT/INR No results found for this basename: LABPROT:2,INR:2 in the last 72 hours ABG No results found for this basename: PHART:2,PCO2:2,PO2:2,HCO3:2 in the last 72 hours  Studies/Results: No results found.  Anti-infectives: Anti-infectives     Start     Dose/Rate Route Frequency Ordered Stop   10/05/11 1400   moxifloxacin (AVELOX) IVPB 400 mg        400 mg 250 mL/hr over 60 Minutes Intravenous 60 min pre-op 10/05/11 1351 10/05/11 1520          Assessment/Plan: s/p Procedure(s): LAPAROSCOPIC ROUX-EN-Y GASTRIC Doing well postoperatively. Stable for discharge. We'll leave drain in place and removed next week.   LOS: 4 days    Archer Moist T 10/09/2011

## 2011-10-09 NOTE — Progress Notes (Signed)
Assessment unchanged. Pt verbalized understanding of dc instructions. Pt taught bulb drain care with verbalized understanding. Drain record sheet and pt education leaflet provided. Pt plans to have drain removed in office on Thursday 10/15/11 per Dr.Hoxworth's instruction. Pt dc'd via wheelchair accompanied by nurse tech to meet mother and awaiting vehicle to carry home.

## 2011-10-09 NOTE — Discharge Summary (Signed)
Patient ID: Dominique Evans [id number] 45 y.o. 1966/01/21  10/05/2011  Discharge date and time: 10/09/2011   Admitting Physician: Glenna Fellows T  Discharge Physician: Glenna Fellows T  Admission Diagnoses: morbid obesity   Discharge Diagnoses: morbid obesity  Operations: Procedure(s): LAPAROSCOPIC ROUX-EN-Y GASTRIC  Admission Condition: good  Discharged Condition: good  Hospital Course: patient was electively admitted for laparoscopic Roux-en-Y gastric bypass with a history of previous failed lap band which had been removed due to persistent esophageal dilatation. On the day of admission she underwent laparoscopic Roux-en-Y gastric bypass. The procedure was moderately difficult due to her previous surgery. Her postoperative course was relatively smooth. Vital signs remained stable and on the first postoperative day she had expected moderate pain and did have a moderately elevated white count. Gastrografin swallow should somewhat poor progression of the contrast although it did enter the gastric pouch and there was no evidence of leak. Poor progression was felt do to her esophageal dilatation. She was left on ice chips only. On the second postoperative day her pain was improved and white count decreased. She had no nausea. She was started on water by mouth and tolerated this well. On the third postoperative day she was begun on protein shakes. Her pain was steadily improving. White count was decreasing. Abdomen remained benign. She had a drain in place which drained only serosanguineous fluid. By the fourth postoperative day she was much more comfortable. Tolerating protein shakes without any difficulty. Drainage remained serosanguineous. She is afebrile and abdomen is benign her white count is normal. She is felt ready for discharge.  Consults: none  Significant Diagnostic Studies: as above   Disposition: Home  Patient Instructions:   Dominique Evans, Dominique Evans  Home  Medication Instructions [id number]   Printed on:10/09/11 1031  Medication Information                    amitriptyline (ELAVIL) 25 MG tablet Take 50 mg by mouth at bedtime. Take 1 tablet by mouth once daily           rosuvastatin (CRESTOR) 10 MG tablet Take 5 mg by mouth every morning.            dexlansoprazole (DEXILANT) 60 MG capsule Take 60 mg by mouth daily.            furosemide (LASIX) 20 MG tablet Take 20 mg by mouth every morning.            polyethylene glycol (MIRALAX) powder Take 17 g by mouth daily. Mix 1 capful in 8 ounces of water daily           desvenlafaxine (PRISTIQ) 50 MG 24 hr tablet Take 50 mg by mouth every morning.            montelukast (SINGULAIR) 10 MG tablet Take 1 tablet by mouth once daily           verapamil (COVERA HS) 240 MG (CO) 24 hr tablet Take 240 mg by mouth every morning. Take 1 tablet by mouth once daily           ferrous fumarate (HEMOCYTE - 106 MG FE) 325 (106 FE) MG TABS Take 1 tablet by mouth 2 (two) times daily.            losartan (COZAAR) 50 MG tablet Take 50 mg by mouth every morning.            BAYER CONTOUR TEST test strip  PROAIR HFA 108 (90 BASE) MCG/ACT inhaler Inhale 2 puffs into the lungs every 4 (four) hours as needed. WHEEZING            Multiple Vitamins-Minerals (MULTIVITAMIN PO) Take 1 tablet by mouth daily.            oxyCODONE-acetaminophen (ROXICET) 5-325 MG/5ML solution Take 5-10 mLs by mouth every 4 (four) hours as needed.             Activity: no heavy lifting for 3 weeks Diet: bariatric shakes Wound Care: May shower. 2 empty drain daily and record amount  Follow-up:  With Dr.Shawnya Mayor in 1 week.  Signed: Mariella Saa MD, FACS  10/09/2011, 10:31 AM

## 2011-10-15 ENCOUNTER — Ambulatory Visit (INDEPENDENT_AMBULATORY_CARE_PROVIDER_SITE_OTHER): Payer: Managed Care, Other (non HMO) | Admitting: General Surgery

## 2011-10-15 ENCOUNTER — Telehealth (INDEPENDENT_AMBULATORY_CARE_PROVIDER_SITE_OTHER): Payer: Self-pay | Admitting: General Surgery

## 2011-10-15 ENCOUNTER — Encounter (INDEPENDENT_AMBULATORY_CARE_PROVIDER_SITE_OTHER): Payer: Self-pay | Admitting: General Surgery

## 2011-10-15 VITALS — BP 134/96 | HR 80 | Temp 97.6°F | Resp 16 | Ht 66.5 in | Wt 262.2 lb

## 2011-10-15 DIAGNOSIS — E669 Obesity, unspecified: Secondary | ICD-10-CM

## 2011-10-15 NOTE — Progress Notes (Signed)
Patient returns to 10 days following laparoscopic Roux-en-Y gastric bypass, revision from previous lap band. She's doing quite well. She is tolerating her protein shakes without difficulty. She denies abdominal pain, fever, or other complaints.  On exam she appears well. Abdomen is soft and nontender. Wounds are all well healed and staples removed. She has minimal serosanguineous JP drainage and her drain is removed.  Assessment and plan doing well following laparoscopic Roux-en-Y gastric bypass, revisional surgery. She is a dietitian next week. We'll gradually increase activity. Return here in 3 weeks.

## 2011-10-15 NOTE — Patient Instructions (Signed)
May resume Humira and exercise program in another 2 weeks.

## 2011-10-21 ENCOUNTER — Encounter: Payer: Self-pay | Admitting: *Deleted

## 2011-10-21 ENCOUNTER — Encounter: Payer: Managed Care, Other (non HMO) | Attending: General Surgery | Admitting: *Deleted

## 2011-10-21 DIAGNOSIS — Z713 Dietary counseling and surveillance: Secondary | ICD-10-CM | POA: Insufficient documentation

## 2011-10-21 DIAGNOSIS — Z9884 Bariatric surgery status: Secondary | ICD-10-CM | POA: Insufficient documentation

## 2011-10-21 DIAGNOSIS — Z09 Encounter for follow-up examination after completed treatment for conditions other than malignant neoplasm: Secondary | ICD-10-CM | POA: Insufficient documentation

## 2011-10-21 NOTE — Progress Notes (Signed)
  Bariatric Class:  Appt start time: 1145 end time:  1215.  2 Week Post-Operative Nutrition Class  Patient was seen on 10/21/2011 for Post-Operative Nutrition education at [facility].   Surgery date: 10/05/11  Surgery type: Gastric Bypass  Start wt @ [facility]: 276.4 lbs  Weight today: 258.8 lb Weight change: 20.7 lbs down Total weight lost: 20.7 lbs total BMI: 41.2%  The following the learning objective met the patient during this course:   Identifies Phase 3A (Soft, High Proteins) Dietary Goals and will begin from 2 weeks post-operatively to 2 months post-operatively   Identifies appropriate sources of fluids and proteins   States protein recommendations and appropriate sources post-operatively  Identifies the need for appropriate texture modifications, mastication, and bite sizes when consuming solids  Identifies appropriate multivitamin and calcium sources post-operatively  Describes the need for physical activity post-operatively and will follow MD recommendations  States when to call healthcare provider regarding medication questions or post-operative complications  Handouts given during class include:  Phase 3A: Soft, High Protein Diet Handout  Band Fill Guidelines Handout  Follow-Up Plan: Patient will follow-up at [facility] in 6 weeks for 2 months post-op nutrition visit for diet advancement per MD.

## 2011-10-21 NOTE — Patient Instructions (Signed)
Patient to follow Phase 3A-Soft, High Protein Diet and follow-up at [facility] in 6 weeks for 2 months post-op nutrition visit for diet advancement. 

## 2011-10-22 ENCOUNTER — Ambulatory Visit (INDEPENDENT_AMBULATORY_CARE_PROVIDER_SITE_OTHER): Payer: Managed Care, Other (non HMO) | Admitting: General Surgery

## 2011-11-04 ENCOUNTER — Encounter (INDEPENDENT_AMBULATORY_CARE_PROVIDER_SITE_OTHER): Payer: Self-pay | Admitting: General Surgery

## 2011-11-04 ENCOUNTER — Ambulatory Visit (INDEPENDENT_AMBULATORY_CARE_PROVIDER_SITE_OTHER): Payer: Managed Care, Other (non HMO) | Admitting: General Surgery

## 2011-11-04 VITALS — BP 132/88 | HR 88 | Temp 97.8°F | Resp 18 | Ht 66.5 in | Wt 256.6 lb

## 2011-11-04 DIAGNOSIS — E669 Obesity, unspecified: Secondary | ICD-10-CM

## 2011-11-04 NOTE — Progress Notes (Signed)
History: Patient returns one month following laparoscopic Roux-en-Y gastric bypass with history of failed LAP-BAND do esophageal dilatation. She is getting along very well. She denies any abdominal or GI complaints. She is getting back to full activity and starting work next week. She's getting back into the gym.  Exam: BP 132/88  Pulse 88  Temp(Src) 97.8 F (36.6 C) (Temporal)  Resp 18  Ht 5' 6.5" (1.689 m)  Wt 256 lb 9.6 oz (116.393 kg)  BMI 40.80 kg/m2  She appears well. Abdomen is soft and nontender. Wounds are well healed.  Assessment and plan: Doing very well one month following laparoscopic Roux-en-Y gastric bypass revision from LAP-BAND. We talked about her diet and exercise program. Return in 2 months

## 2011-12-01 ENCOUNTER — Encounter: Payer: Managed Care, Other (non HMO) | Attending: General Surgery | Admitting: *Deleted

## 2011-12-01 DIAGNOSIS — Z713 Dietary counseling and surveillance: Secondary | ICD-10-CM | POA: Insufficient documentation

## 2011-12-01 DIAGNOSIS — Z09 Encounter for follow-up examination after completed treatment for conditions other than malignant neoplasm: Secondary | ICD-10-CM | POA: Insufficient documentation

## 2011-12-01 DIAGNOSIS — Z9884 Bariatric surgery status: Secondary | ICD-10-CM | POA: Insufficient documentation

## 2011-12-01 NOTE — Progress Notes (Signed)
  Follow-up visit: 8 Weeks Post-Operative Gastric Bypass (LAGB Revision) Surgery  Medical Nutrition Therapy:  Appt start time: 1730 end time:  1800.  Assessment:  Primary concerns today: post-operative bariatric surgery nutrition management. Dominique Evans reports not problems post op except for chronic constipation which she attributes to a hernia she has that has given her problems for many years.  Surgery date: 10/05/11  Surgery type: Gastric Bypass  Start wt @ [facility]: 276.4 lbs  Weight today: 249.6 lbs Weight change: 9.2 lbs Total weight lost: 29.9 lbs since surgery BMI: 39.8%  24-hr recall:  B (AM): Egg (1-2), 2 [place] bacon Snk (AM): n/a   L (PM): Hard boiled egg, cheese, deli meat (3-4 oz) Snk (PM): cheese stick  D (PM): Hamburger patty w/ cheese (2 oz) Snk (PM): Atkin's Protein Shake (EOD)  Fluid intake: water, powerade zero = 64 oz+ Estimated total protein intake: 65g/day  Medications: No changes Supplementation: Taking regular  Using straws: No Drinking while eating: No Hair loss: No Carbonated beverages: No N/V/D/C: Constipation regularly Dumping syndrome: None reported - "I do not believe that I will have dumping syndrome"  Recent physical activity:  Exercising reguarly via trainer and classes (4-5 times - 60 mins+) and walking dog outside  Progress Towards Goal(s):  In progress.  Handouts given during visit include:  Phase 3B - High Protein + Non-starchy Vegetables   Nutritional Diagnosis:  [place]-1.4 Altered GI function As related to hernia and recent RNY Gastric Bypass.  As evidenced by pt with chronic constipation s/p surgery.    Intervention:  Nutrition education.  Monitoring/Evaluation:  Dietary intake, exercise, lap band fills, and body weight. Follow up in 6-8 months for 3-4 month post-op visit.

## 2011-12-01 NOTE — Patient Instructions (Signed)
Goals:  Follow Phase 3B: High Protein + Non-Starchy Vegetables  Eat 3-6 small meals/snacks, every 3-5 hrs  Increase lean protein foods to meet 60-80g goal  Increase fluid intake to 64oz +  Avoid drinking 15 minutes before, during and 30 minutes after eating  Aim for >30 min of physical activity daily 

## 2011-12-07 ENCOUNTER — Ambulatory Visit (INDEPENDENT_AMBULATORY_CARE_PROVIDER_SITE_OTHER): Payer: Managed Care, Other (non HMO) | Admitting: Adult Health

## 2011-12-07 ENCOUNTER — Encounter: Payer: Self-pay | Admitting: Adult Health

## 2011-12-07 VITALS — BP 150/80 | HR 84 | Temp 97.7°F | Ht 66.5 in | Wt 253.4 lb

## 2011-12-07 DIAGNOSIS — J45991 Cough variant asthma: Secondary | ICD-10-CM

## 2011-12-07 MED ORDER — FLUTICASONE-SALMETEROL 100-50 MCG/DOSE IN AEPB: 1.0000 | INHALATION_SPRAY | Freq: Two times a day (BID) | RESPIRATORY_TRACT | Status: DC

## 2011-12-07 NOTE — Assessment & Plan Note (Signed)
Asthma flare with URI   Plan  Finish Zpack  Begin Advair 100/18mcg 1 puff Twice daily  -brush/rinse/gargle after use.  follow up Dr. Craige Cotta  In 1 month and As needed   Please contact office for sooner follow up if symptoms do not improve or worsen or seek emergency care

## 2011-12-07 NOTE — Progress Notes (Signed)
  Subjective:    Patient ID: Dominique Evans, female    DOB: October 02, 1966, 46 y.o.   MRN: [id number]  HPI 46 yo WF with known hx of Asthma   12/07/2011 Acute OV  Pt complains of sob, difficulty breathing, fatigue, chest pain on left side, sore throat, dry cough. States her son has had strep throat for the past 3 weeks. Was exposed to nail polish last week.  Was seen at urgent care yesterday , rx Zpack and cough syrup  Informs me she had hx of Lap Band surgery ~2007. Then last year it was removed due to severe GI issues. Underwent Gastris Bypass 09/2011 - has lost 29 lbs . Has tolerated Gastric bypass well.  No fever, chest pain or edema. Had strep test done last ov .    Review of Systems Constitutional:   No  weight loss, night sweats,  Fevers, chills,  +fatigue, or  lassitude.  HEENT:   No headaches,  Difficulty swallowing,  Tooth/dental problems, or   +Sore throat,                No sneezing, itching, ear ache,  +nasal congestion, post nasal drip,   CV:  No chest pain,  Orthopnea, PND, swelling in lower extremities, anasarca, dizziness, palpitations, syncope.   GI  No heartburn, indigestion, abdominal pain, nausea, vomiting, diarrhea, change in bowel habits, loss of appetite, bloody stools.   Resp:   No coughing up of blood.   Marland Kitchen  No chest wall deformity  Skin: no rash or lesions.  GU: no dysuria, change in color of urine, no urgency or frequency.  No flank pain, no hematuria   MS:  No joint pain or swelling.  No decreased range of motion.  No back pain.  Psych:  No change in mood or affect. No depression or anxiety.  No memory loss.         Objective:   Physical Exam GEN: A/Ox3; pleasant , NAD, well nourished   HEENT:  [place]/AT,  EACs-clear, TMs-wnl, NOSE-clear, THROAT-clear, no lesions, no postnasal drip or exudate noted.   NECK:  Supple w/ fair ROM; no JVD; normal carotid impulses w/o bruits; no thyromegaly or nodules palpated; no lymphadenopathy.  RESP  Coarse BS   w/o, wheezes/ rales/ or rhonchi.no accessory muscle use, no dullness to percussion  CARD:  RRR, no m/r/g  , no peripheral edema, pulses intact, no cyanosis or clubbing.  GI:   Soft & nt; nml bowel sounds; no organomegaly or masses detected.  Musco: Warm bil, no deformities or joint swelling noted.   Neuro: alert, no focal deficits noted.    Skin: Warm, no lesions or rashes         Assessment & Plan:

## 2011-12-07 NOTE — Patient Instructions (Signed)
Finish Zpack  Begin Advair 100/9mcg 1 puff Twice daily  -brush/rinse/gargle after use.  follow up Dr. Craige Cotta  In 1 month and As needed   Please contact office for sooner follow up if symptoms do not improve or worsen or seek emergency care

## 2011-12-10 NOTE — Progress Notes (Signed)
Reviewed and agree with assessment/plan. 

## 2011-12-22 ENCOUNTER — Telehealth: Payer: Self-pay | Admitting: Pulmonary Disease

## 2011-12-22 NOTE — Telephone Encounter (Signed)
Spoke with [employer] downstairs and they state that disability papers were sent up to Dr Craige Cotta on 12-15-11.  Dr Craige Cotta have you seen papers on this pt?  Please advise

## 2011-12-24 NOTE — Telephone Encounter (Signed)
I do not have any forms for this.

## 2011-12-28 NOTE — Telephone Encounter (Signed)
Spoke with Dominique Evans in [facility] and she states that she has the forms and they should be completed and faxed back by tomorrow. I called Dominique Evans and notified her of this and she verbalized understanding and states nothing further needed.

## 2011-12-31 ENCOUNTER — Encounter (INDEPENDENT_AMBULATORY_CARE_PROVIDER_SITE_OTHER): Payer: Self-pay | Admitting: General Surgery

## 2011-12-31 ENCOUNTER — Ambulatory Visit (INDEPENDENT_AMBULATORY_CARE_PROVIDER_SITE_OTHER): Payer: Managed Care, Other (non HMO) | Admitting: General Surgery

## 2011-12-31 VITALS — BP 117/70 | HR 91 | Temp 98.0°F | Ht 66.5 in | Wt 241.4 lb

## 2011-12-31 DIAGNOSIS — E669 Obesity, unspecified: Secondary | ICD-10-CM

## 2011-12-31 DIAGNOSIS — K912 Postsurgical malabsorption, not elsewhere classified: Secondary | ICD-10-CM

## 2011-12-31 LAB — COMPREHENSIVE METABOLIC PANEL
ALT: 25 U/L (ref 0–35)
AST: 24 U/L (ref 0–37)
BUN: 11 mg/dL (ref 6–23)
CO2: 26 mEq/L (ref 19–32)
Chloride: 104 mEq/L (ref 96–112)
Creat: 0.84 mg/dL (ref 0.50–1.10)
Potassium: 3.7 mEq/L (ref 3.5–5.3)
Total Protein: 7 g/dL (ref 6.0–8.3)

## 2011-12-31 LAB — HEMOGLOBIN A1C
Hgb A1c MFr Bld: 5.9 % — ABNORMAL HIGH (ref <5.7)
Mean Plasma Glucose: 123 mg/dL — ABNORMAL HIGH (ref <117)

## 2011-12-31 LAB — CBC WITH DIFFERENTIAL/PLATELET
Basophils Absolute: 0 10*3/uL (ref 0.0–0.1)
Eosinophils Relative: 1 % (ref 0–5)
HCT: 36.7 % (ref 36.0–46.0)
Hemoglobin: 11.6 g/dL — ABNORMAL LOW (ref 12.0–15.0)
Lymphocytes Relative: 32 % (ref 12–46)
Lymphs Abs: 2 10*3/uL (ref 0.7–4.0)
MCH: 25.9 pg — ABNORMAL LOW (ref 26.0–34.0)
MCV: 81.9 fL (ref 78.0–100.0)
Monocytes Absolute: 0.3 10*3/uL (ref 0.1–1.0)
Monocytes Relative: 5 % (ref 3–12)
Neutro Abs: 3.7 10*3/uL (ref 1.7–7.7)
Platelets: 262 10*3/uL (ref 150–400)
RBC: 4.48 MIL/uL (ref 3.87–5.11)
RDW: 15.1 % (ref 11.5–15.5)
WBC: 6.1 10*3/uL (ref 4.0–10.5)

## 2011-12-31 LAB — LIPID PANEL
HDL: 30 mg/dL — ABNORMAL LOW (ref >39)
LDL Cholesterol: 55 mg/dL (ref 0–99)
Total CHOL/HDL Ratio: 3.8 Ratio
Triglycerides: 151 mg/dL — ABNORMAL HIGH (ref <150)
VLDL: 30 mg/dL (ref 0–40)

## 2011-12-31 LAB — FOLATE: Folate: >20 ng/mL

## 2011-12-31 LAB — IRON AND TIBC: UIBC: 335 ug/dL (ref 125–400)

## 2011-12-31 NOTE — Progress Notes (Signed)
Chief complaint: Followup gastric bypass  History: Patient returns for routine followup in 3 months for laparoscopic Roux-en-Y gastric bypass, revision from LAP-BAND due to esophageal dilatation. She continues to do very well. Really no complaints at all in the office today. She is tolerating her high-protein diet without pain or vomiting or reflux. No other GI complaints. She is exercising about an hour or 5 days a week.  Exam: BP 117/70  Pulse 91  Temp(Src) 98 F (36.7 C) (Temporal)  Ht 5' 6.5" (1.689 m)  Wt 241 lb 6.4 oz (109.498 kg)  BMI 38.38 kg/m2  SpO2 98%  General: Appears well Skin: No rash or infection Lungs: Clear without wheeze or increased work of breathing Abdomen: Incision is well-healed. Soft and nontender. Extremities: No edema  Assessment plan: Doing well following gastric bypass without complication  identified. Diabetes in remission. HTN current, joint pain improved, GERD still on meds without Sxs. Total weight lost is 30 pounds from preop. We'll check lab work today and she to return in 3 months.

## 2012-01-01 LAB — MAGNESIUM: Magnesium: 1.9 mg/dL (ref 1.5–2.5)

## 2012-01-06 ENCOUNTER — Ambulatory Visit (INDEPENDENT_AMBULATORY_CARE_PROVIDER_SITE_OTHER): Payer: Managed Care, Other (non HMO) | Admitting: Pulmonary Disease

## 2012-01-06 ENCOUNTER — Encounter: Payer: Self-pay | Admitting: Pulmonary Disease

## 2012-01-06 VITALS — BP 142/80 | HR 76 | Temp 98.2°F | Ht 66.5 in | Wt 245.8 lb

## 2012-01-06 DIAGNOSIS — J45991 Cough variant asthma: Secondary | ICD-10-CM

## 2012-01-06 MED ORDER — FLUTICASONE-SALMETEROL 100-50 MCG/DOSE IN AEPB: 1.0000 | INHALATION_SPRAY | Freq: Two times a day (BID) | RESPIRATORY_TRACT | Status: DC

## 2012-01-06 NOTE — Patient Instructions (Signed)
Follow-up in one year.

## 2012-01-06 NOTE — Progress Notes (Signed)
Chief Complaint  Patient presents with  . Follow-up    Pt states her breathing is getting better--denies any cough, wheezng, chest tightness  . Medication Refill    needs 90 day supply of advair is she is too continue on this    History of Present Illness: Dominique Evans is a 46 y.o. female former smoker with chronic cough from asthma, post-nasal drip, and reflux.  She has been doing better since she resumed advair and singulair.  She is not having cough, wheeze, or sputum.  Her sinuses are doing okay.  She has revision of her lap band, and has been doing well.  She is not having reflux.   Past Medical History  Diagnosis Date  . Unspecified essential hypertension   . Hyperlipidemia   . GERD (gastroesophageal reflux disease)   . 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 Chronic constipation   . Obesity   . Asthma     PFT 08/12/07: FEV1 2.97 (104%), FEV1% 84, TLC 4.89 (90%), DLCO 84%, +BD  . Allergic rhinitis   . Panic attacks   . Hydatidiform mole   . Diabetes mellitus   . Visual disturbance   . PONV (postoperative nausea and vomiting)   . Anemia   . Blood transfusion     hx of 2002   . 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 Depression   . Psoriasis     Past Surgical History  Procedure Date  . Nephrectomy 2002    left  . Elbow fracture surgery     left  . Shoulder arthroscopy     right shoulder  . Laparoscopic gastric banding 02/2006    w/ truncal vagotomy  . Patent ductus arterious repair   . Lap band removal    . Gastric roux-en-y 10/05/2011    Procedure: LAPAROSCOPIC ROUX-EN-Y GASTRIC;  Surgeon: Mariella Saa, MD;  Location: [facility];  Service: General;  Laterality: N/A;  UPPER ENDOSCOPY    Allergies  Allergen Reactions  . Penicillins Anaphylaxis  . Sulfonamide Derivatives Anaphylaxis    Physical Exam:  Blood pressure 142/80, pulse 76, temperature 98.2 F (36.8 C), temperature  source Oral, height 5' 6.5" (1.689 m), weight 245 lb 12.8 oz (111.494 kg), SpO2 97.00%. Body mass index is 39.08 kg/(m^2).  Wt Readings from Last 2 Encounters:  01/06/12 245 lb 12.8 oz (111.494 kg)  12/31/11 241 lb 6.4 oz (109.498 kg)    General - Obese  HEENT - no sinus tenderness, no oral exudate, no LAN Cardiac - s1s2 regular, no murmur Chest - no wheeze/rales Abdomen - soft, nontender Extremities - no edema Skin - no rashes Neurologic - normal strength Psychiatric - normal mood, behavior   Assessment/Plan:  Outpatient Encounter Prescriptions as of 01/06/2012  Medication Sig Dispense Refill  . adalimumab (HUMIRA PEN) 40 MG/0.8ML injection Inject 40 mg into the skin once.       Marland Kitchen amitriptyline (ELAVIL) 25 MG tablet Take 50 mg by mouth at bedtime. Take 1 tablet by mouth once daily      . CALCIUM PO Take by mouth daily.      . Cyanocobalamin (VITAMIN B12 PO) Take by mouth daily.      Marland Kitchen desvenlafaxine (PRISTIQ) 50 MG 24 hr tablet Take 50 mg by mouth every morning.       Marland Kitchen dexlansoprazole (DEXILANT) 60 MG capsule Take 60 mg by mouth daily.       Marland Kitchen  ferrous fumarate (HEMOCYTE - 106 MG FE) 325 (106 FE) MG TABS Take 1 tablet by mouth 2 (two) times daily.       . Fluticasone-Salmeterol (ADVAIR DISKUS) 100-50 MCG/DOSE AEPB Inhale 1 puff into the lungs 2 (two) times daily.  1 each  5  . furosemide (LASIX) 20 MG tablet Take 20 mg by mouth every morning.       Marland Kitchen losartan (COZAAR) 50 MG tablet Take 50 mg by mouth every morning.       . montelukast (SINGULAIR) 10 MG tablet Take 1 tablet by mouth once daily      . Multiple Vitamins-Minerals (MULTIVITAMIN PO) Take 1 tablet by mouth daily.       . polyethylene glycol (MIRALAX) powder Take 17 g by mouth daily. Mix 1 capful in 8 ounces of water daily      . PROAIR HFA 108 (90 BASE) MCG/ACT inhaler Inhale 2 puffs into the lungs every 4 (four) hours as needed. WHEEZING       . rosuvastatin (CRESTOR) 10 MG tablet Take 5 mg by mouth every morning.        . verapamil (COVERA HS) 240 MG (CO) 24 hr tablet Take 240 mg by mouth every morning. Take 1 tablet by mouth once daily      . DISCONTD: azithromycin (ZITHROMAX) 250 MG tablet Take 250 mg by mouth daily.        Humphrey Guerreiro Pager:  [phone] 01/06/2012, 4:47 PM

## 2012-01-06 NOTE — Assessment & Plan Note (Signed)
She is doing well on her current regimen of advair and singulair with prn proair.

## 2012-01-11 ENCOUNTER — Other Ambulatory Visit: Payer: Self-pay | Admitting: *Deleted

## 2012-01-11 ENCOUNTER — Telehealth: Payer: Self-pay | Admitting: Pulmonary Disease

## 2012-01-11 MED ORDER — FLUTICASONE-SALMETEROL 100-50 MCG/DOSE IN AEPB: 1.0000 | INHALATION_SPRAY | Freq: Two times a day (BID) | RESPIRATORY_TRACT | Status: DC

## 2012-01-11 NOTE — Telephone Encounter (Signed)
Lm for pt that refill for Advair was went to [facility].  Rx sent for this on 33-20-13 was sent to [facility] pharmacy.  I removed this pharmacy from her list.

## 2012-01-12 ENCOUNTER — Ambulatory Visit: Payer: Managed Care, Other (non HMO) | Admitting: *Deleted

## 2012-01-13 ENCOUNTER — Telehealth (INDEPENDENT_AMBULATORY_CARE_PROVIDER_SITE_OTHER): Payer: Self-pay

## 2012-01-13 NOTE — Telephone Encounter (Signed)
At patient request most recent lab results were faxed to Dr. Charlton Haws (PCP) [phone].

## 2012-01-13 NOTE — Telephone Encounter (Signed)
Error

## 2012-01-23 ENCOUNTER — Encounter: Payer: Managed Care, Other (non HMO) | Attending: General Surgery | Admitting: *Deleted

## 2012-01-23 ENCOUNTER — Encounter: Payer: Self-pay | Admitting: *Deleted

## 2012-01-23 DIAGNOSIS — Z713 Dietary counseling and surveillance: Secondary | ICD-10-CM | POA: Insufficient documentation

## 2012-01-23 DIAGNOSIS — Z9884 Bariatric surgery status: Secondary | ICD-10-CM | POA: Insufficient documentation

## 2012-01-23 DIAGNOSIS — Z09 Encounter for follow-up examination after completed treatment for conditions other than malignant neoplasm: Secondary | ICD-10-CM | POA: Insufficient documentation

## 2012-01-23 NOTE — Progress Notes (Addendum)
  Follow-up visit:  3 months Post-Operative Gastric Bypass (LAGB Revision) Surgery  Medical Nutrition Therapy:  Appt start time: 1100   End time:  1130.  Assessment:  Primary concerns today: post-operative bariatric surgery nutrition management.  Estle returns today for f/u.  Reports doing well and happy with weight loss so far. No longer drinking protein shakes, though protein intake appears WNL. Has decreased exercise d/t working overtime at work. Reports FBG of 87-92 mg and 2-hr pp of 102-107 mg. Reports some hyperglycemia, likely d/t minimal dietary intake prior to exercise. No other issues reported.   Surgery date: 10/05/11  Surgery type: Gastric Bypass  Start wt @ [facility]: 276.4 lbs Wt @ Surgery: 279.5  Weight today: 234.5 lbs Weight change: -15.1 lbs Total weight lost: 45 lbs since surgery BMI: 37.2%  Goal wt: <199 lbs % Goal met: 56%  TANITA  BODY COMP RESULTS  01/23/12     %Fat 47.5%     FM (lbs) 111.5     FFM (lbs) 123.0     TBW (lbs) 90.0      24-hr recall:  B (AM): Cheese, 1 pc raisin cinnamon toast Snk (AM): handful of nuts  L (PM): PB&J or salad with 3-4 oz chicken breast and hard boiled egg Snk (PM): 1/2 cup of fruit w/ cheese D (PM): Cheese, tbsp peanut butter  Snk (PM): n/a  Fluid intake: water, vitamin water = 64 oz+ Estimated total protein intake: 60-70g/day  Medications: No changes Supplementation: Taking regularly  Using straws: No Drinking while eating: No Hair loss: No Carbonated beverages: Yes - "I open a diet Mtn Dew, put it in a drawer for a day, then drink it over 2-3 days"  N/V/D/C: Vomiting recently from tomato sauce Dumping syndrome: None reported   Recent physical activity:  Exercising reguarly via trainer and classes (4-5 times - 60 mins+) and walking dog outside  Progress Towards Goal(s):  In progress.  Handouts given during visit include:  Phase 3B - High Protein + Non-starchy Vegetables   Nutritional Diagnosis:  [place]-3.3  Overweight/obesity related to recent Gastric Bypass surgery as evidenced by patient following Gastric Bypass nutrition guidelines for continued weight loss.    Intervention:  Nutrition education.  Monitoring/Evaluation:  Dietary intake, exercise, and body weight. Follow up in 3 months for 6 month post-op visit.  Samples Dispensed:   Unjury Protein Powder (Chocolate Splendor)  Lot # [id number]; Exp: 05/14    TwinLab (Chocolate): 1 pkt Lot # [id number] ; Exp: 10/14    B.A. Calcium Crystals (unflavored): 3 pkts Lot # [id number] ; Exp: 03/14

## 2012-01-23 NOTE — Patient Instructions (Signed)
Goals:  Follow Phase 3B: High Protein + Non-Starchy Vegetables  Eat 3-6 small meals/snacks, every 3-5 hrs  Increase lean protein foods to meet 60-80g goal  Increase fluid intake to 64oz +  Avoid drinking 15 minutes before, during and 30 minutes after eating  Practice what we discussed regarding prevention of low blood sugars (i.e. Juice in water, etc)

## 2012-04-12 ENCOUNTER — Telehealth (INDEPENDENT_AMBULATORY_CARE_PROVIDER_SITE_OTHER): Payer: Self-pay

## 2012-04-12 NOTE — Telephone Encounter (Signed)
Left message for patient to call our office, r/s appt from 04/14/12 to 05/03/12 w/Dr. Johna Sheriff @ 4:30 pm

## 2012-04-14 ENCOUNTER — Ambulatory Visit (INDEPENDENT_AMBULATORY_CARE_PROVIDER_SITE_OTHER): Payer: Managed Care, Other (non HMO) | Admitting: General Surgery

## 2012-04-27 ENCOUNTER — Ambulatory Visit: Payer: Managed Care, Other (non HMO) | Admitting: *Deleted

## 2012-04-30 ENCOUNTER — Encounter: Payer: Managed Care, Other (non HMO) | Attending: General Surgery | Admitting: *Deleted

## 2012-04-30 ENCOUNTER — Encounter: Payer: Self-pay | Admitting: *Deleted

## 2012-04-30 VITALS — Ht 66.5 in | Wt 229.5 lb

## 2012-04-30 DIAGNOSIS — Z9884 Bariatric surgery status: Secondary | ICD-10-CM | POA: Insufficient documentation

## 2012-04-30 DIAGNOSIS — E669 Obesity, unspecified: Secondary | ICD-10-CM

## 2012-04-30 DIAGNOSIS — Z713 Dietary counseling and surveillance: Secondary | ICD-10-CM | POA: Insufficient documentation

## 2012-04-30 DIAGNOSIS — Z09 Encounter for follow-up examination after completed treatment for conditions other than malignant neoplasm: Secondary | ICD-10-CM | POA: Insufficient documentation

## 2012-04-30 NOTE — Progress Notes (Signed)
  Follow-up visit:  7 months Post-Operative Gastric Bypass (LAGB Revision) Surgery  Medical Nutrition Therapy:  Appt start time: 1700   End time:  1730.  Primary concerns today:  Post-operative bariatric surgery nutrition management.  Dominique Evans returns today for f/u.  Reports she will be starting with a healthy lifestyle coach provided by work on 05/12/12.  No longer drinking protein shakes, though protein intake appears WNL. Continues decreased exercise d/t knee injury during Zumba class 2 mos ago and several bouts with bronchitis. Reports she gained 15 lbs during illnesses d/t prednisone and increased food intake. Checks BGs 4x/day and reports FBG of 80-100 mg and 2hrPP of 50-70 mg. Reports increased hyperglycemia, even though she has not been exercising d/t illness. Advised pt to contact PCP.  Surgery date: 10/05/11  Surgery type: Gastric Bypass  Start wt @ [facility]: 276.4 lbs Wt @ Surgery: 279.5 lbs  Weight today: 229.5 lbs Weight change: - 5.0 lbs Total weight lost: 46.9 lbs (since assessment) BMI: 36.5 kg/m^2  Goal wt: <199 lbs % Goal met: 60%  TANITA  BODY COMP RESULTS  01/23/12 04/30/12  %Fat 47.5% 45.7%  FM (lbs) 111.5 105.0  FFM (lbs) 123.0 124.5  TBW (lbs) 90.0 91.0   24-hr recall: B (AM): Eggs w/ cheese, 2 pcs [place] bacon or 1/2 PB&J (SF) Snk (AM): handful of nuts L (PM): Spinach salad w/ grilled chicken and feta cheese Snk (PM): 1/2 cup of fruit w/ cheese  D (PM): 1/2 cheeseburger w/ 1-2 pcs bread  Snk (PM): 1/2 cheeseburger w/ NO bun  Fluid intake: water, vitamin water, flat Mt. Dew = 65-75 oz Estimated total protein intake: 60-70g/day  Medications: No changes Supplementation: Taking regularly  Using straws: No Drinking while eating: No Hair loss: No Carbonated beverages: Yes - "I open a diet Mtn Dew, put it in a drawer for a day, then drink it over 2-3 days"  N/V/D/C: Diarrhea d/t mocha cookie crumble frap Dumping syndrome: None reported   Recent physical activity:   Exercising reguarly via trainer and classes (4-5 times - 60 mins+) and walking dog outside  Progress Towards Goal(s):  In progress.   Nutritional Diagnosis:  [place]-3.3 Overweight/obesity related to recent Gastric Bypass surgery as evidenced by patient following Gastric Bypass nutrition guidelines for continued weight loss.    Intervention:  Nutrition education.  Monitoring/Evaluation:  Dietary intake, exercise, and body weight. Follow up in 5 months for 12 month post-op visit.

## 2012-04-30 NOTE — Patient Instructions (Addendum)
Goals:  Continue previous goals  Add 15 g of carbs to each meal to help low blood sugars  Contact Dr. Wylene Simmer or Dr. Johna Sheriff regarding low blood sugars  Take 1500 mg calcium in (3) 500 mg doses and 2 MVI daily. See handout.

## 2012-05-03 ENCOUNTER — Ambulatory Visit (INDEPENDENT_AMBULATORY_CARE_PROVIDER_SITE_OTHER): Payer: Managed Care, Other (non HMO) | Admitting: General Surgery

## 2012-09-24 ENCOUNTER — Encounter: Payer: Self-pay | Admitting: *Deleted

## 2012-09-24 ENCOUNTER — Encounter: Payer: Managed Care, Other (non HMO) | Attending: General Surgery | Admitting: *Deleted

## 2012-09-24 VITALS — Ht 66.5 in | Wt 242.5 lb

## 2012-09-24 DIAGNOSIS — Z713 Dietary counseling and surveillance: Secondary | ICD-10-CM | POA: Insufficient documentation

## 2012-09-24 DIAGNOSIS — E669 Obesity, unspecified: Secondary | ICD-10-CM

## 2012-09-24 DIAGNOSIS — Z9884 Bariatric surgery status: Secondary | ICD-10-CM | POA: Insufficient documentation

## 2012-09-24 DIAGNOSIS — Z09 Encounter for follow-up examination after completed treatment for conditions other than malignant neoplasm: Secondary | ICD-10-CM | POA: Insufficient documentation

## 2012-09-24 NOTE — Patient Instructions (Addendum)
Goals:  Continue previous goals  Add 15 g of carbs to each meal/snack to help low blood sugars  Contact Dr. Wylene Simmer or Dr. Johna Sheriff regarding low blood sugars if they continue  Take 1500 mg calcium in (3) 500 mg doses if approved by urologist.  Increase exercise as able per MD.

## 2012-09-24 NOTE — Progress Notes (Signed)
  Follow-up visit:  12 months Post-Operative Gastric Bypass (LAGB Revision) Surgery  Medical Nutrition Therapy:  Appt start time: 1245   End time:  1330.  Primary concerns today:  Post-operative bariatric surgery nutrition management.  Dominique Evans returns for 1 year f/u with a 13 lb wt gain.  Reports several injuries/accidents the last few months. Now has herniated disk that is pushing against nerve #5 and a cyst behind left knee. Unable to exercise. Has a "TENS" unit for her back and has had more cortisone injections/epidural spinal injection. Will have a brace soon.  Also continues to have hypoglycemic episodes (~2-3 x/week); states it's worse when she "eats well" (i.e. Low carb). Has added some carbs into her diet as previously discussed, but inconsistent. Talked about consistency to regulate BG. Advised f/u with MD if continues.   Surgery date: 10/05/11  Surgery type: Gastric Bypass  Start wt @ [facility]: 276.4 lbs Wt @ Surgery: 279.5 lbs  Weight today: 242.5 lbs Weight change: + 13.0 lbs GAIN Total weight lost: 33.9 lbs (since assessment) BMI: 38.6 kg/m^2  Goal wt: <199 lbs % Goal met: 42%  TANITA  BODY COMP RESULTS  01/23/12 04/30/12 09/24/12  FM (lbs) 111.5 105.0 112.0  FFM (lbs) 123.0 124.5 130.5  TBW (lbs) 90.0 91.0 95.5   24-hr recall: B (AM): 2 eggs w/ cheese, 1 pc white toast Snk (AM): [place] yogurt and honey and oat granola bar L (PM): Spinach salad w/ grilled chicken and 1/4 cup feta cheese & 2 eggs; SF raspberry vin Snk (PM):  Atkins protein shake OR 1 oz bag or 1/4 cup of pistachios  D (6:30-7:30 PM): Thin crust pizza (5-6 squares/2-3 pcs) OR chicken, cheese on low-CHO wrap Snk (PM): None  Fluid intake: water, vitamin water, flat Mt. Dew = 65-80 oz Estimated total protein intake: 60-70g/day  Medications: No changes Supplementation: Taking MVI regularly. Only 1 dose of calcium d/t fear of additional calcium oxalate crystallization. Pt to f/u with urologist regarding dose  approval.  Using straws: No Drinking while eating: Sometimes; baby sips with dry food Hair loss: No Carbonated beverages: Yes, but only when food is stuck N/V/D/C: None Dumping syndrome: None reported   Recent physical activity:  None at this time  Samples given during visit include:   Quick Sticks - Sour Apple: 9 ea Lot # [id number] Exp: 01/14  Progress Towards Goal(s):  In progress.   Nutritional Diagnosis:  [place]-3.3 Overweight/obesity related to recent Gastric Bypass surgery as evidenced by patient following Gastric Bypass nutrition guidelines for continued weight loss.    Intervention:  Nutrition education/reinforcement.  Monitoring/Evaluation:  Dietary intake, exercise, and body weight. Follow up in February 2014 for 14 month post-op visit.

## 2012-11-10 ENCOUNTER — Other Ambulatory Visit: Payer: Self-pay | Admitting: Orthopedic Surgery

## 2012-11-19 ENCOUNTER — Ambulatory Visit: Payer: Managed Care, Other (non HMO) | Admitting: *Deleted

## 2012-11-23 ENCOUNTER — Encounter (HOSPITAL_COMMUNITY): Payer: Self-pay | Admitting: Pharmacy Technician

## 2012-11-24 NOTE — Patient Instructions (Signed)
[name]  11/24/2012   Your procedure is scheduled on:  11/30/12   Report to [facility] at   0630 AM.  Call this number if you have problems the morning of surgery: [phone]   Remember:   Do not eat food or drink liquids after midnight.   Take these medicines the morning of surgery with A SIP OF WATER:    Do not wear jewelry, make-up or nail polish.  Do not wear lotions, powders, or perfumes.   Do not shave 48 hours prior to surgery.   Do not bring valuables to the hospital.  Contacts, dentures or bridgework may not be worn into surgery.  Leave suitcase in the car. After surgery it may be brought to your room.  For patients admitted to the hospital, checkout time is 11:00 AM the day of  discharge.    SEE CHG INSTRUCTION SHEET    Please read over the following fact sheets that you were given: MRSA Information, coughing and deep breathing exercises, leg exercises, Incentive Spirometry Fact sheet                Failure to comply with these instructions may result in cancellation of your surgery.                Patient Signature ____________________________              Nurse Signature _____________________________

## 2012-11-25 ENCOUNTER — Encounter (HOSPITAL_COMMUNITY)
Admission: RE | Admit: 2012-11-25 | Discharge: 2012-11-25 | Disposition: A | Discharge status: Home / Self Care | Payer: Managed Care, Other (non HMO) | Source: Ambulatory Visit | Attending: Specialist | Admitting: Specialist

## 2012-11-25 ENCOUNTER — Encounter (HOSPITAL_COMMUNITY): Payer: Self-pay

## 2012-11-25 ENCOUNTER — Ambulatory Visit (HOSPITAL_COMMUNITY)
Admission: RE | Admit: 2012-11-25 | Discharge: 2012-11-25 | Disposition: A | Discharge status: Home / Self Care | Payer: Managed Care, Other (non HMO) | Source: Ambulatory Visit | Attending: Orthopedic Surgery | Admitting: Orthopedic Surgery

## 2012-11-25 DIAGNOSIS — Z01818 Encounter for other preprocedural examination: Secondary | ICD-10-CM | POA: Insufficient documentation

## 2012-11-25 DIAGNOSIS — Z01810 Encounter for preprocedural cardiovascular examination: Secondary | ICD-10-CM | POA: Insufficient documentation

## 2012-11-25 DIAGNOSIS — Z01812 Encounter for preprocedural laboratory examination: Secondary | ICD-10-CM | POA: Insufficient documentation

## 2012-11-25 DIAGNOSIS — I10 Essential (primary) hypertension: Secondary | ICD-10-CM | POA: Insufficient documentation

## 2012-11-25 LAB — BASIC METABOLIC PANEL
BUN: 15 mg/dL (ref 6–23)
Chloride: 101 mEq/L (ref 96–112)
Creatinine, Ser: 0.98 mg/dL (ref 0.50–1.10)
GFR calc non Af Amer: 67 mL/min — ABNORMAL LOW (ref >90)
Glucose, Bld: 101 mg/dL — ABNORMAL HIGH (ref 70–99)
Potassium: 3.7 mEq/L (ref 3.5–5.1)

## 2012-11-25 LAB — CBC
HCT: 38.1 % (ref 36.0–46.0)
Hemoglobin: 12.6 g/dL (ref 12.0–15.0)
MCH: 28.3 pg (ref 26.0–34.0)
MCHC: 33.1 g/dL (ref 30.0–36.0)

## 2012-11-25 LAB — HCG, SERUM, QUALITATIVE: Preg, Serum: NEGATIVE

## 2012-11-25 LAB — SURGICAL PCR SCREEN: MRSA, PCR: NEGATIVE

## 2012-11-26 ENCOUNTER — Encounter: Payer: Managed Care, Other (non HMO) | Attending: General Surgery | Admitting: *Deleted

## 2012-11-26 ENCOUNTER — Encounter: Payer: Self-pay | Admitting: *Deleted

## 2012-11-26 VITALS — Ht 66.5 in | Wt 241.0 lb

## 2012-11-26 DIAGNOSIS — Z09 Encounter for follow-up examination after completed treatment for conditions other than malignant neoplasm: Secondary | ICD-10-CM | POA: Insufficient documentation

## 2012-11-26 DIAGNOSIS — Z9884 Bariatric surgery status: Secondary | ICD-10-CM | POA: Insufficient documentation

## 2012-11-26 DIAGNOSIS — E669 Obesity, unspecified: Secondary | ICD-10-CM

## 2012-11-26 DIAGNOSIS — Z713 Dietary counseling and surveillance: Secondary | ICD-10-CM | POA: Insufficient documentation

## 2012-11-26 NOTE — Patient Instructions (Addendum)
Goals:  Continue previous goals  Add 15 g of carbs to each meal/snack to help low blood sugars  Contact Dr. Wylene Simmer or Dr. Johna Sheriff regarding low blood sugars if they continue  Take 1500 mg calcium in (3) 500 mg doses if approved by urologist.  Try Quest bars, but watch the ones with sugar alcohols ( @ [employer] or [employer])  Watch carb portions while recovering from back surgery  Exercise as able after surgery per MD ok.

## 2012-11-26 NOTE — Progress Notes (Addendum)
  Follow-up visit:  14 months Post-Operative Gastric Bypass (LAGB Revision) Surgery  Medical Nutrition Therapy:  Appt start time: 1245   End time:  1315.  Primary concerns today:  Post-operative bariatric surgery nutrition management.  Dominique Evans returns for f/u with a 1.5 lb weight loss.  Scheduled for back surgery on 11/30/12, which will need 4-6 weeks recovery. Has gone back to eating whatever she wants. Wants to regroup after back surgery. Reports recent hypoglycemic episode the middle of the night with BG of 45 mg after eating a large amount of pizza for dinner. Episodes seem to be regulating however. States intention to d/c diet Mtn Dew.  Surgery date: 10/05/11  Surgery type: Gastric Bypass  Start wt @ [facility]: 276.4 lbs Wt @ Surgery: 279.5 lbs  Weight today: 241.0 lbs Weight change: 1.5 lbs LOSS Total weight lost: 35.4 lbs (since assessment) BMI: 38.3 kg/m^2  Goal wt: <199 lbs % Goal met: 42%  TANITA  BODY COMP RESULTS  01/23/12 04/30/12 09/24/12 11/26/12  FM (lbs) 111.5 105.0 112.0 112.0  FFM (lbs) 123.0 124.5 130.5 129.0  TBW (lbs) 90.0 91.0 95.5 94.5   24-hr recall: B (AM): 2 poached eggs w/ cheese, 1 pc white toast Snk (AM): NONE L (PM): Spinach salad w/ grilled chicken Snk (PM): 2 slices cheese and 2 pcs [place] bacon  D (6:30-7:30 PM): Thin crust pizza (3/4 of pizza) Snk (PM): NONE  Fluid intake:  Water w/ Crystal Light, flat Mt. Dew =  80-100 oz Estimated total protein intake:  60-70g/day  Medications: No changes Supplementation: Taking MVI and B12 regularly. Only 1 dose of calcium d/t fear of additional calcium oxalate crystallization. Pt to f/u with urologist regarding dose approval.  Using straws: No Drinking while eating: Sometimes; baby sips with dry food Hair loss: No Carbonated beverages: Yes, but only when food is stuck N/V/D/C: None Dumping syndrome: None reported   Recent physical activity:  None at this time d/t back pain  Progress Towards Goal(s):  In  progress.   Nutritional Diagnosis:  [place]-3.3 Overweight/obesity related to recent Gastric Bypass surgery as evidenced by patient following Gastric Bypass nutrition guidelines for continued weight loss.    Intervention:  Nutrition education/reinforcement.  Monitoring/Evaluation:  Dietary intake, exercise, and body weight. Follow up on April 5th for 16 month post-op visit.

## 2012-11-29 MED ORDER — SODIUM CHLORIDE 0.9 % IV SOLN
1500.0000 mg | INTRAVENOUS | Status: AC
  Administered 2012-11-30: 1500 mg via INTRAVENOUS
  Filled 2012-11-29: qty 1500

## 2012-11-30 ENCOUNTER — Encounter (HOSPITAL_COMMUNITY): Payer: Self-pay | Admitting: *Deleted

## 2012-11-30 ENCOUNTER — Ambulatory Visit (HOSPITAL_COMMUNITY): Payer: Managed Care, Other (non HMO)

## 2012-11-30 ENCOUNTER — Ambulatory Visit (HOSPITAL_COMMUNITY)
Admission: RE | Admit: 2012-11-30 | Discharge: 2012-12-02 | Disposition: A | Discharge status: Home / Self Care | Payer: Managed Care, Other (non HMO) | Source: Ambulatory Visit | Attending: Specialist | Admitting: Specialist

## 2012-11-30 ENCOUNTER — Encounter (HOSPITAL_COMMUNITY)
Admission: RE | Disposition: A | Discharge status: Home / Self Care | Payer: Self-pay | Source: Ambulatory Visit | Attending: Specialist

## 2012-11-30 ENCOUNTER — Ambulatory Visit (HOSPITAL_COMMUNITY): Payer: Managed Care, Other (non HMO) | Admitting: *Deleted

## 2012-11-30 DIAGNOSIS — M5126 Other intervertebral disc displacement, lumbar region: Secondary | ICD-10-CM

## 2012-11-30 DIAGNOSIS — E785 Hyperlipidemia, unspecified: Secondary | ICD-10-CM | POA: Insufficient documentation

## 2012-11-30 DIAGNOSIS — Z905 Acquired absence of kidney: Secondary | ICD-10-CM | POA: Insufficient documentation

## 2012-11-30 DIAGNOSIS — E119 Type 2 diabetes mellitus without complications: Secondary | ICD-10-CM | POA: Insufficient documentation

## 2012-11-30 DIAGNOSIS — J45909 Unspecified asthma, uncomplicated: Secondary | ICD-10-CM | POA: Insufficient documentation

## 2012-11-30 DIAGNOSIS — Z79899 Other long term (current) drug therapy: Secondary | ICD-10-CM | POA: Insufficient documentation

## 2012-11-30 DIAGNOSIS — K219 Gastro-esophageal reflux disease without esophagitis: Secondary | ICD-10-CM | POA: Insufficient documentation

## 2012-11-30 HISTORY — DX: Other intervertebral disc displacement, lumbar region: M51.26

## 2012-11-30 HISTORY — PX: LUMBAR LAMINECTOMY/DECOMPRESSION MICRODISCECTOMY: SHX5026

## 2012-11-30 LAB — GLUCOSE, CAPILLARY
Glucose-Capillary: 96 mg/dL (ref 70–99)
Glucose-Capillary: 100 mg/dL — ABNORMAL HIGH (ref 70–99)

## 2012-11-30 SURGERY — LUMBAR LAMINECTOMY/DECOMPRESSION MICRODISCECTOMY 1 LEVEL
Anesthesia: General | Site: Back | Laterality: Left | Wound class: Clean

## 2012-11-30 MED ORDER — LACTATED RINGERS IV SOLN
INTRAVENOUS | Status: DC
  Administered 2012-11-30: 09:00:00 via INTRAVENOUS
  Administered 2012-11-30: 1000 mL via INTRAVENOUS

## 2012-11-30 MED ORDER — ACETAMINOPHEN 10 MG/ML IV SOLN
INTRAVENOUS | Status: AC
  Filled 2012-11-30: qty 100

## 2012-11-30 MED ORDER — ACETAMINOPHEN 10 MG/ML IV SOLN
INTRAVENOUS | Status: DC | PRN
  Administered 2012-11-30: 1000 mg via INTRAVENOUS

## 2012-11-30 MED ORDER — HYDROMORPHONE HCL PF 1 MG/ML IJ SOLN
0.2500 mg | INTRAMUSCULAR | Status: DC | PRN
Start: 2012-11-30 — End: 2012-11-30

## 2012-11-30 MED ORDER — ALBUTEROL SULFATE HFA 108 (90 BASE) MCG/ACT IN AERS
2.0000 | INHALATION_SPRAY | RESPIRATORY_TRACT | Status: DC | PRN
  Filled 2012-11-30: qty 6.7

## 2012-11-30 MED ORDER — AMITRIPTYLINE HCL 50 MG PO TABS
50.0000 mg | ORAL_TABLET | Freq: Every day | ORAL | Status: DC
  Administered 2012-11-30 – 2012-12-01 (×2): 50 mg via ORAL
  Filled 2012-11-30 (×3): qty 1

## 2012-11-30 MED ORDER — DEXAMETHASONE SODIUM PHOSPHATE 10 MG/ML IJ SOLN
INTRAMUSCULAR | Status: DC | PRN
  Administered 2012-11-30: 10 mg via INTRAVENOUS

## 2012-11-30 MED ORDER — SODIUM CHLORIDE 0.9 % IJ SOLN: 3.0000 mL | Freq: Two times a day (BID) | INTRAMUSCULAR | Status: DC

## 2012-11-30 MED ORDER — LACTATED RINGERS IV SOLN: INTRAVENOUS | Status: DC

## 2012-11-30 MED ORDER — PROPOFOL 10 MG/ML IV EMUL
INTRAVENOUS | Status: DC | PRN
  Administered 2012-11-30: 200 mg via INTRAVENOUS

## 2012-11-30 MED ORDER — HYDROMORPHONE HCL PF 1 MG/ML IJ SOLN
0.5000 mg | INTRAMUSCULAR | Status: DC | PRN
  Administered 2012-11-30: 1 mg via INTRAVENOUS
  Filled 2012-11-30: qty 1

## 2012-11-30 MED ORDER — SODIUM CHLORIDE 0.45 % IV SOLN
INTRAVENOUS | Status: DC
  Administered 2012-11-30: 17:00:00 via INTRAVENOUS

## 2012-11-30 MED ORDER — PROGESTERONE MICRONIZED 200 MG PO CAPS: 200.0000 mg | ORAL_CAPSULE | Freq: Every day | ORAL | Status: DC

## 2012-11-30 MED ORDER — MIDAZOLAM HCL 5 MG/5ML IJ SOLN
INTRAMUSCULAR | Status: DC | PRN
  Administered 2012-11-30 (×2): 1 mg via INTRAVENOUS

## 2012-11-30 MED ORDER — CISATRACURIUM BESYLATE (PF) 10 MG/5ML IV SOLN
INTRAVENOUS | Status: DC | PRN
  Administered 2012-11-30: 2 mg via INTRAVENOUS
  Administered 2012-11-30: 6 mg via INTRAVENOUS

## 2012-11-30 MED ORDER — NEOSTIGMINE METHYLSULFATE 1 MG/ML IJ SOLN
INTRAMUSCULAR | Status: DC | PRN
  Administered 2012-11-30: 1 mg via INTRAVENOUS

## 2012-11-30 MED ORDER — THROMBIN 5000 UNITS EX SOLR
CUTANEOUS | Status: AC
  Filled 2012-11-30: qty 10000

## 2012-11-30 MED ORDER — OXYCODONE-ACETAMINOPHEN 5-325 MG PO TABS
1.0000 | ORAL_TABLET | ORAL | Status: DC | PRN
  Administered 2012-11-30 – 2012-12-02 (×7): 2 via ORAL
  Administered 2012-12-02: 1 via ORAL
  Filled 2012-11-30 (×7): qty 2

## 2012-11-30 MED ORDER — MOMETASONE FURO-FORMOTEROL FUM 100-5 MCG/ACT IN AERO
2.0000 | INHALATION_SPRAY | Freq: Two times a day (BID) | RESPIRATORY_TRACT | Status: DC
  Administered 2012-11-30 – 2012-12-02 (×4): 2 via RESPIRATORY_TRACT
  Filled 2012-11-30: qty 8.8

## 2012-11-30 MED ORDER — MENTHOL 3 MG MT LOZG: 1.0000 | LOZENGE | OROMUCOSAL | Status: DC | PRN

## 2012-11-30 MED ORDER — POLYETHYLENE GLYCOL 3350 17 GM/SCOOP PO POWD
17.0000 g | Freq: Every day | ORAL | Status: DC | PRN
  Filled 2012-11-30: qty 255

## 2012-11-30 MED ORDER — VERAPAMIL HCL 240 MG (CO) PO TB24: 240.0000 mg | ORAL_TABLET | ORAL | Status: DC

## 2012-11-30 MED ORDER — FENTANYL CITRATE 0.05 MG/ML IJ SOLN
INTRAMUSCULAR | Status: DC | PRN
  Administered 2012-11-30: 150 ug via INTRAVENOUS
  Administered 2012-11-30 (×2): 50 ug via INTRAVENOUS

## 2012-11-30 MED ORDER — ACETAMINOPHEN 325 MG PO TABS: 650.0000 mg | ORAL_TABLET | ORAL | Status: DC | PRN

## 2012-11-30 MED ORDER — BUPIVACAINE-EPINEPHRINE (PF) 0.5% -1:200000 IJ SOLN
INTRAMUSCULAR | Status: AC
  Filled 2012-11-30: qty 10

## 2012-11-30 MED ORDER — THROMBIN 5000 UNITS EX SOLR
CUTANEOUS | Status: DC | PRN
  Administered 2012-11-30: 10000 [IU] via TOPICAL

## 2012-11-30 MED ORDER — ONDANSETRON HCL 4 MG/2ML IJ SOLN: 4.0000 mg | INTRAMUSCULAR | Status: DC | PRN

## 2012-11-30 MED ORDER — LIDOCAINE HCL (CARDIAC) 20 MG/ML IV SOLN
INTRAVENOUS | Status: DC | PRN
  Administered 2012-11-30: 75 mg via INTRAVENOUS

## 2012-11-30 MED ORDER — ACETAMINOPHEN 650 MG RE SUPP: 650.0000 mg | RECTAL | Status: DC | PRN

## 2012-11-30 MED ORDER — SODIUM CHLORIDE 0.9 % IV SOLN: 250.0000 mL | INTRAVENOUS | Status: DC

## 2012-11-30 MED ORDER — SODIUM CHLORIDE 0.9 % IV SOLN
INTRAVENOUS | Status: DC | PRN
  Administered 2012-11-30: 08:00:00 via INTRAVENOUS

## 2012-11-30 MED ORDER — VENLAFAXINE HCL ER 37.5 MG PO CP24
37.5000 mg | ORAL_CAPSULE | Freq: Every day | ORAL | Status: DC
  Administered 2012-12-01 – 2012-12-02 (×2): 37.5 mg via ORAL
  Filled 2012-11-30 (×3): qty 1

## 2012-11-30 MED ORDER — GLYCOPYRROLATE 0.2 MG/ML IJ SOLN
INTRAMUSCULAR | Status: DC | PRN
  Administered 2012-11-30: .3 mg via INTRAVENOUS

## 2012-11-30 MED ORDER — MONTELUKAST SODIUM 10 MG PO TABS
10.0000 mg | ORAL_TABLET | Freq: Every day | ORAL | Status: DC
  Filled 2012-11-30 (×2): qty 1

## 2012-11-30 MED ORDER — SODIUM CHLORIDE 0.9 % IJ SOLN: 3.0000 mL | INTRAMUSCULAR | Status: DC | PRN

## 2012-11-30 MED ORDER — SUCCINYLCHOLINE CHLORIDE 20 MG/ML IJ SOLN
INTRAMUSCULAR | Status: DC | PRN
  Administered 2012-11-30: 100 mg via INTRAVENOUS

## 2012-11-30 MED ORDER — METHOCARBAMOL 500 MG PO TABS: 500.0000 mg | ORAL_TABLET | Freq: Three times a day (TID) | ORAL | Status: DC

## 2012-11-30 MED ORDER — BUPIVACAINE-EPINEPHRINE 0.5% -1:200000 IJ SOLN
INTRAMUSCULAR | Status: DC | PRN
  Administered 2012-11-30: 10 mL

## 2012-11-30 MED ORDER — ALUM & MAG HYDROXIDE-SIMETH 200-200-20 MG/5ML PO SUSP
30.0000 mL | ORAL | Status: DC | PRN
  Administered 2012-11-30: 30 mL via ORAL
  Filled 2012-11-30: qty 30

## 2012-11-30 MED ORDER — SODIUM CHLORIDE 0.9 % IR SOLN: Freq: Once | Status: DC

## 2012-11-30 MED ORDER — VERAPAMIL HCL ER 240 MG PO TBCR
240.0000 mg | EXTENDED_RELEASE_TABLET | Freq: Every day | ORAL | Status: DC
  Administered 2012-12-01 – 2012-12-02 (×2): 240 mg via ORAL
  Filled 2012-11-30 (×2): qty 1

## 2012-11-30 MED ORDER — INSULIN ASPART 100 UNIT/ML ~~LOC~~ SOLN: 0.0000 [IU] | Freq: Three times a day (TID) | SUBCUTANEOUS | Status: DC

## 2012-11-30 MED ORDER — PHENOL 1.4 % MT LIQD: 1.0000 | OROMUCOSAL | Status: DC | PRN

## 2012-11-30 MED ORDER — VANCOMYCIN HCL 10 G IV SOLR: 1500.0000 mg | Freq: Two times a day (BID) | INTRAVENOUS | Status: DC

## 2012-11-30 MED ORDER — VANCOMYCIN HCL IN DEXTROSE 1-5 GM/200ML-% IV SOLN
1000.0000 mg | Freq: Two times a day (BID) | INTRAVENOUS | Status: DC
  Administered 2012-11-30 – 2012-12-01 (×2): 1000 mg via INTRAVENOUS
  Filled 2012-11-30 (×2): qty 200

## 2012-11-30 MED ORDER — HYDROCODONE-ACETAMINOPHEN 5-325 MG PO TABS: 1.0000 | ORAL_TABLET | ORAL | Status: DC | PRN

## 2012-11-30 MED ORDER — HYDROMORPHONE HCL PF 1 MG/ML IJ SOLN
INTRAMUSCULAR | Status: DC | PRN
  Administered 2012-11-30: 2 mg via INTRAVENOUS

## 2012-11-30 MED ORDER — ONDANSETRON HCL 4 MG/2ML IJ SOLN
INTRAMUSCULAR | Status: DC | PRN
  Administered 2012-11-30 (×2): 2 mg via INTRAVENOUS

## 2012-11-30 MED ORDER — CHLORHEXIDINE GLUCONATE 4 % EX LIQD
60.0000 mL | Freq: Once | CUTANEOUS | Status: DC
  Filled 2012-11-30: qty 60

## 2012-11-30 MED ORDER — OXYCODONE-ACETAMINOPHEN 7.5-325 MG PO TABS: 1.0000 | ORAL_TABLET | ORAL | Status: DC | PRN

## 2012-11-30 MED ORDER — CLOBETASOL PROPIONATE 0.05 % EX LIQD: 1.0000 "application " | Freq: Every day | CUTANEOUS | Status: DC | PRN

## 2012-11-30 MED ORDER — FUROSEMIDE 20 MG PO TABS
20.0000 mg | ORAL_TABLET | Freq: Every day | ORAL | Status: DC
  Administered 2012-12-01 – 2012-12-02 (×2): 20 mg via ORAL
  Filled 2012-11-30 (×2): qty 1

## 2012-11-30 MED ORDER — PANTOPRAZOLE SODIUM 40 MG PO TBEC
40.0000 mg | DELAYED_RELEASE_TABLET | Freq: Every day | ORAL | Status: DC
  Administered 2012-12-01 – 2012-12-02 (×2): 40 mg via ORAL
  Filled 2012-11-30 (×2): qty 1

## 2012-11-30 MED ORDER — POLYETHYLENE GLYCOL 3350 17 G PO PACK: 17.0000 g | PACK | Freq: Every day | ORAL | Status: DC | PRN

## 2012-11-30 MED ORDER — DOCUSATE SODIUM 100 MG PO CAPS
100.0000 mg | ORAL_CAPSULE | Freq: Two times a day (BID) | ORAL | Status: DC
  Administered 2012-11-30 – 2012-12-01 (×4): 100 mg via ORAL

## 2012-11-30 SURGICAL SUPPLY — 51 items
APL SKNCLS STERI-STRIP NONHPOA (GAUZE/BANDAGES/DRESSINGS) ×2
BAG SPEC THK2 15X12 ZIP CLS (MISCELLANEOUS)
BAG ZIPLOCK 12X15 (MISCELLANEOUS) ×1 IMPLANT
BENZOIN TINCTURE PRP APPL 2/3 (GAUZE/BANDAGES/DRESSINGS) ×4 IMPLANT
CHLORAPREP W/TINT 26ML (MISCELLANEOUS) IMPLANT
CLEANER TIP ELECTROSURG 2X2 (MISCELLANEOUS) ×2 IMPLANT
CLOTH BEACON ORANGE TIMEOUT ST (SAFETY) ×2 IMPLANT
DECANTER SPIKE VIAL GLASS SM (MISCELLANEOUS) ×2 IMPLANT
DRAPE MICROSCOPE LEICA (MISCELLANEOUS) ×2 IMPLANT
DRAPE POUCH INSTRU U-SHP 10X18 (DRAPES) ×2 IMPLANT
DRAPE SURG 17X11 SM STRL (DRAPES) ×2 IMPLANT
DRSG AQUACEL AG ADV 3.5X 4 (GAUZE/BANDAGES/DRESSINGS) ×1 IMPLANT
DRSG EMULSION OIL 3X3 NADH (GAUZE/BANDAGES/DRESSINGS) IMPLANT
DRSG PAD ABDOMINAL 8X10 ST (GAUZE/BANDAGES/DRESSINGS) IMPLANT
DRSG TELFA 4X5 ISLAND ADH (GAUZE/BANDAGES/DRESSINGS) IMPLANT
DURAPREP 26ML APPLICATOR (WOUND CARE) ×2 IMPLANT
ELECT REM PT RETURN 9FT ADLT (ELECTROSURGICAL) ×2
ELECTRODE REM PT RTRN 9FT ADLT (ELECTROSURGICAL) ×1 IMPLANT
GLOVE BIOGEL PI IND STRL 7.5 (GLOVE) ×1 IMPLANT
GLOVE BIOGEL PI IND STRL 8 (GLOVE) ×1 IMPLANT
GLOVE BIOGEL PI INDICATOR 7.5 (GLOVE) ×1
GLOVE BIOGEL PI INDICATOR 8 (GLOVE) ×1
GLOVE SURG SS PI 7.5 STRL IVOR (GLOVE) ×2 IMPLANT
GLOVE SURG SS PI 8.0 STRL IVOR (GLOVE) ×4 IMPLANT
GOWN PREVENTION PLUS LG XLONG (DISPOSABLE) ×2 IMPLANT
GOWN STRL REIN XL XLG (GOWN DISPOSABLE) ×4 IMPLANT
KIT BASIN OR (CUSTOM PROCEDURE TRAY) ×2 IMPLANT
KIT POSITIONING SURG ANDREWS (MISCELLANEOUS) ×2 IMPLANT
MANIFOLD NEPTUNE II (INSTRUMENTS) ×2 IMPLANT
NDL SPNL 18GX3.5 QUINCKE PK (NEEDLE) ×3 IMPLANT
NEEDLE SPNL 18GX3.5 QUINCKE PK (NEEDLE) ×6 IMPLANT
PATTIES SURGICAL .5 X.5 (GAUZE/BANDAGES/DRESSINGS) IMPLANT
PATTIES SURGICAL .75X.75 (GAUZE/BANDAGES/DRESSINGS) IMPLANT
PATTIES SURGICAL 1X1 (DISPOSABLE) IMPLANT
SPONGE SURGIFOAM ABS GEL 100 (HEMOSTASIS) ×2 IMPLANT
STAPLER VISISTAT (STAPLE) IMPLANT
STRIP CLOSURE SKIN 1/2X4 (GAUZE/BANDAGES/DRESSINGS) ×2 IMPLANT
SUT PROLENE 3 0 PS 2 (SUTURE) ×1 IMPLANT
SUT VIC AB 0 CT1 27 (SUTURE)
SUT VIC AB 0 CT1 27XBRD ANTBC (SUTURE) IMPLANT
SUT VIC AB 1 CT1 27 (SUTURE)
SUT VIC AB 1 CT1 27XBRD ANTBC (SUTURE) ×1 IMPLANT
SUT VIC AB 1-0 CT2 27 (SUTURE) ×2 IMPLANT
SUT VIC AB 2-0 CT1 27 (SUTURE) ×4
SUT VIC AB 2-0 CT1 TAPERPNT 27 (SUTURE) ×1 IMPLANT
SUT VIC AB 2-0 CT2 27 (SUTURE) ×2 IMPLANT
SUT VICRYL 0 27 CT2 27 ABS (SUTURE) ×2 IMPLANT
SUT VICRYL 0 UR6 27IN ABS (SUTURE) IMPLANT
SYRINGE 10CC LL (SYRINGE) ×4 IMPLANT
TRAY LAMINECTOMY (CUSTOM PROCEDURE TRAY) ×2 IMPLANT
YANKAUER SUCT BULB TIP NO VENT (SUCTIONS) ×2 IMPLANT

## 2012-11-30 NOTE — Preoperative (Signed)
Beta Blockers   Reason not to administer Beta Blockers:Not Applicable 

## 2012-11-30 NOTE — Evaluation (Signed)
Physical Therapy Evaluation Patient Details Name: Dominique Evans MRN: [id number] DOB: 1966-03-25 Today's Date: 11/30/2012 Time: 4098-1191 PT Time Calculation (min): 26 min  PT Assessment / Plan / Recommendation Clinical Impression  Pt s/p lumbar surg presents with functional mobility limited by post op discomfort and back precautions    PT Assessment  Patient needs continued PT services    Follow Up Recommendations  No PT follow up    Does the patient have the potential to tolerate intense rehabilitation      Barriers to Discharge None      Equipment Recommendations  None recommended by PT    Recommendations for Other Services OT consult   Frequency 7X/week    Precautions / Restrictions Precautions Precautions: Back Precaution Booklet Issued: Yes (comment) Restrictions Weight Bearing Restrictions: No   Pertinent Vitals/Pain 2/10; premed      Mobility  Bed Mobility Bed Mobility: Supine to Sit;Rolling Left;Left Sidelying to Sit Rolling Left: 4: Min guard Left Sidelying to Sit: 4: Min guard Supine to Sit: 4: Min guard Details for Bed Mobility Assistance: cues for correct LOG roll technique and sequencing Transfers Transfers: Sit to Stand;Stand to Sit Sit to Stand: 4: Min guard Stand to Sit: 4: Min guard Details for Transfer Assistance: cues for LE positioning, use of UEs to assist , and minimizing fwd flex Ambulation/Gait Ambulation/Gait Assistance: 4: Min guard Ambulation Distance (Feet): 500 Feet Ambulation/Gait Assistance Details: min cues for pacing and to relax UEs Gait Pattern: Within Functional Limits Stairs: Yes Stairs Assistance: 4: Min assist Stairs Assistance Details (indicate cue type and reason): cues for sequence Stair Management Technique: No rails;Step to pattern;Forwards Number of Stairs: 1 (twice)    Exercises     PT Diagnosis: Difficulty walking;Other (comment) (Decreased functional mobility - in/out bed)  PT Problem List:  Obesity;Pain;Decreased mobility;Decreased knowledge of precautions;Decreased activity tolerance;Decreased knowledge of use of DME PT Treatment Interventions: DME instruction;Gait training;Stair training;Functional mobility training;Therapeutic activities;Patient/family education   PT Goals Acute Rehab PT Goals PT Goal Formulation: With patient Time For Goal Achievement: 12/02/12 Potential to Achieve Goals: Good Pt will go Supine/Side to Sit: with supervision PT Goal: Supine/Side to Sit - Progress: Goal set today Pt will go Sit to Supine/Side: with supervision PT Goal: Sit to Supine/Side - Progress: Goal set today Pt will go Sit to Stand: with supervision PT Goal: Sit to Stand - Progress: Goal set today Pt will go Stand to Sit: with supervision PT Goal: Stand to Sit - Progress: Goal set today Pt will Ambulate: >150 feet;with supervision PT Goal: Ambulate - Progress: Goal set today Pt will Go Up / Down Stairs: 1-2 stairs;with supervision PT Goal: Up/Down Stairs - Progress: Goal set today  Visit Information  Last PT Received On: 11/30/12 Assistance Needed: +1    Subjective Data  Subjective: I need to get up and walk Patient Stated Goal: Back to work with decreased pain   Prior Functioning  Home Living Lives With: Family Available Help at Discharge: Family Type of Home: Apartment Home Access: Stairs to enter Secretary/administrator of Steps: 1 Home Layout: One level Home Adaptive Equipment: Straight cane Prior Function Level of Independence: Independent Able to Take Stairs?: Yes Driving: Yes Vocation: Full time employment Communication Communication: No difficulties    Cognition  Cognition Overall Cognitive Status: Appears within functional limits for tasks assessed/performed Arousal/Alertness: Awake/alert Orientation Level: Appears intact for tasks assessed Behavior During Session: [facility] for tasks performed    Extremity/Trunk Assessment Right Upper Extremity  Assessment RUE ROM/Strength/Tone: [facility]  for tasks assessed Left Upper Extremity Assessment LUE ROM/Strength/Tone: [facility] for tasks assessed Right Lower Extremity Assessment RLE ROM/Strength/Tone: [facility] for tasks assessed Left Lower Extremity Assessment LLE ROM/Strength/Tone: [facility] for tasks assessed   Balance    End of Session PT - End of Session Patient left: in chair;with call bell/phone within reach Nurse Communication: Mobility status  GP Functional Assessment Tool Used: clinical judgement Functional Limitation: Mobility: Walking and moving around Mobility: Walking and Moving Around Current Status ([id number]): At least 1 percent but less than 20 percent impaired, limited or restricted Mobility: Walking and Moving Around Goal Status [phone]): At least 1 percent but less than 20 percent impaired, limited or restricted   [facility] 11/30/2012, 3:32 PM

## 2012-11-30 NOTE — Anesthesia Postprocedure Evaluation (Signed)
  Anesthesia Post-op Note  Patient: Dominique Evans  Procedure(s) Performed: Procedure(s) (LRB): MICRO LUMBAR DECOMPRESSION L4-5 ON THE LEFT (Left)  Patient Location: PACU  Anesthesia Type: General  Level of Consciousness: awake and alert   Airway and Oxygen Therapy: Patient Spontanous Breathing  Post-op Pain: mild  Post-op Assessment: Post-op Vital signs reviewed, Patient's Cardiovascular Status Stable, Respiratory Function Stable, Patent Airway and No signs of Nausea or vomiting  Last Vitals:  Filed Vitals:   11/30/12 1100  BP: 138/57  Pulse: 79  Temp:   Resp: 12    Post-op Vital Signs: stable   Complications: No apparent anesthesia complications

## 2012-11-30 NOTE — Progress Notes (Addendum)
ANTIBIOTIC CONSULT NOTE - INITIAL  Pharmacy Consult for Vancomycin  Allergies  Allergen Reactions  . Penicillins Anaphylaxis  . Sulfonamide Derivatives Anaphylaxis    Patient Measurements: Height: 5\' 6"  (167.6 cm) Weight: 240 lb (108.863 kg) IBW/kg (Calculated) : 59.3  Vital Signs: Temp: 97.6 F (36.4 C) (02/12 1230) Temp src: Oral (02/12 1230) BP: 137/74 mmHg (02/12 1230) Pulse Rate: 80 (02/12 1230) Intake/Output from previous day:   Intake/Output from this shift: Total I/O In: 2100 [I.V.:2100] Out: 450 [Urine:400; Blood:50]  Labs: No results found for this basename: WBC, HGB, PLT, LABCREA, CREATININE,  in the last 72 hours Estimated Creatinine Clearance: 87.7 ml/min (by C-G formula based on Cr of 0.98). No results found for this basename: VANCOTROUGH, VANCOPEAK, VANCORANDOM, GENTTROUGH, GENTPEAK, GENTRANDOM, TOBRATROUGH, TOBRAPEAK, TOBRARND, AMIKACINPEAK, AMIKACINTROU, AMIKACIN,  in the last 72 hours   Microbiology: No results found for this or any previous visit (from the past 720 hour(s)).  Medical History: Past Medical History  Diagnosis Date  . Unspecified essential hypertension   . Hyperlipidemia   . GERD (gastroesophageal reflux disease)   . 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 Chronic constipation   . Obesity   . Asthma     PFT 08/12/07: FEV1 2.97 (104%), FEV1% 84, TLC 4.89 (90%), DLCO 84%, +BD  . Allergic rhinitis   . Panic attacks   . Hydatidiform mole   . Diabetes mellitus   . Visual disturbance   . PONV (postoperative nausea and vomiting)   . Anemia   . Blood transfusion     hx of 2002   . 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 Depression   . Psoriasis     Assessment:  46 yof with herniated nucleus polpusus and lumbar stenosis s/p L lumbar decompression, foraminotomies and microdiskectomy (L4-5) on 2/12.  Patient received Vancomycin 1500 mg IV x 1 pre-op (@0730  this  morning).  MD ordered for Vancomycin 1500 mg IV q12h post-op with comment for pharmacy to dose.    Wt 108.9 kg, CG 87 ml/min, N 80 ml/min  Attempted to call Dr. Shelle Iron to verify if Vancomycin was intended to continued past 24 hours post-op or just a one time dose but Dr. Shelle Iron is no longer available.  Pharmacy will dose Vancomycin continuously as ordered for now and will get in touch with MD tomorrow to clarify.  Goal of Therapy:  Vancomycin trough level 15-20 mcg/ml  Plan:   Vancomycin 1gm IV q12h to start tonight at 2200  Pharmacy will f/u with MD in AM to clarify LOT.  Geoffry Paradise, PharmD, BCPS Pager: [phone] 2:20 PM Pharmacy #: [phone]

## 2012-11-30 NOTE — Transfer of Care (Signed)
Immediate Anesthesia Transfer of Care Note  Patient: Dominique Evans  Procedure(s) Performed: Procedure(s) with comments: MICRO LUMBAR DECOMPRESSION L4-5 ON THE LEFT (Left) - MICRO LUMBAR DECOMPRESSION L4-5 ON THE LEFT  Patient Location: PACU  Anesthesia Type:General  Level of Consciousness: awake, oriented, patient cooperative, lethargic and responds to stimulation  Airway & Oxygen Therapy: Patient Spontanous Breathing and Patient connected to face mask oxygen  Post-op Assessment: Report given to PACU RN, Post -op Vital signs reviewed and stable and Patient moving all extremities  Post vital signs: Reviewed and stable  Complications: No apparent anesthesia complications

## 2012-11-30 NOTE — Brief Op Note (Signed)
11/30/2012  10:25 AM  PATIENT:  Dominique Evans  47 y.o. female  PRE-OPERATIVE DIAGNOSIS:  H&P STENOSIS L4-5  POST-OPERATIVE DIAGNOSIS:  herniated nucleus polpusus and lumbar stenosis  PROCEDURE:  Procedure(s) with comments: MICRO LUMBAR DECOMPRESSION L4-5 ON THE LEFT (Left) - MICRO LUMBAR DECOMPRESSION L4-5 ON THE LEFT  SURGEON:  Surgeon(s) and Role:    * Javier Docker, MD - Primary  PHYSICIAN ASSISTANT:   ASSISTANTS: [place]   ANESTHESIA:   general  EBL:  Total I/O In: 1500 [I.V.:1500] Out: 50 [Blood:50]  BLOOD ADMINISTERED:none  DRAINS: none   LOCAL MEDICATIONS USED:  MARCAINE     SPECIMEN:  Source of Specimen:  L45 disc  DISPOSITION OF SPECIMEN:  PATHOLOGY  COUNTS:  YES  TOURNIQUET:  * No tourniquets in log *  DICTATION: .Other Dictation: Dictation Number [id number]  PLAN OF CARE: Admit for overnight observation  PATIENT DISPOSITION:  PACU - hemodynamically stable.   Delay start of Pharmacological VTE agent (>24hrs) due to surgical blood loss or risk of bleeding: yes

## 2012-11-30 NOTE — Op Note (Signed)
NAMESEMONE, Dominique Evans           ACCOUNT NO.:  [id number]  MEDICAL RECORD NO.:  [id number]  LOCATION:  1609                         FACILITY:  [facility]  PHYSICIAN:  Jene Every, M.D.    DATE OF BIRTH:  January 27, 1966  DATE OF PROCEDURE:  11/30/2012 DATE OF DISCHARGE:                              OPERATIVE REPORT   PREOPERATIVE DIAGNOSIS:  Spinal stenosis, herniated nucleus pulposus at L4-5, left.  POSTOPERATIVE DIAGNOSIS:  Spinal stenosis, herniated nucleus pulposus at L4-5, left.  PROCEDURE: 1. Microlumbar decompression L4-5, left. 2. Foraminotomies of L4 and L5. 3. Microdiskectomy, L4-5, left.  ANESTHESIA:  General.  ASSISTANT:  Lanna Poche, PA  Use of operating microscope.  BRIEF HISTORY:  A 46 with refractory lower extremity radicular pain L4- L5 nerve root distribution, disk herniation, lateral recess stenosis. She was indicated for decompression due to persistent EHL weakness, neural tension signs, refractory to conservative treatment.  Risks and benefits discussed including bleeding, infection, damage to vascular structures, DVT, PE, anesthetic complications, etc.  Great difficulty with hands due to the patient's morbid obesity.  The patient was 110 kg.  TECHNIQUE:  With the patient in a supine position and after induction of adequate general anesthesia, placed prone on the [place] frame.  All bony prominences were well padded.  Lumbar region was prepped and draped in usual sterile fashion.  Two 18-gauge spinal needle was utilized, localized at 4-5 interspace, confirmed with x-ray.  Incision was made from spinous process 4-5.  Subcutaneous tissue was dissected out. Electrocautery was utilized to achieve hemostasis.  Dorsolumbar fascia identified __________ skin incision.  Paraspinous muscle elevated from L4-5.  McCullough retractor was placed.  Operative microscope was draped and brought in the surgical field with a confirmatory radiograph obtained.   Hemilaminotomy of the caudad edge of 4 was performed with 2 and 3 mm Kerrison and micro osteotome preserving the pars.  Very small interlaminar window was noted.  Ligamentum flavum detached from the cephalad edge of 5 utilizing 2 mm Kerrison.  Neuro patty placed beneath the ligamentum flavum.  Performed foraminotomy of 5, was fairly stenotic.  Hypertrophic facet was noted as well as ligamentum flavum hypertrophy.  Ligamentum flavum removed from the interspace and the lateral recess was decompressed to the medial border of the pedicle. The superior articulating process of 5 was undercut.  Stenotic foramen of 4 was noted and foraminotomy of 4 was performed as well.  We protected the 5 root proximally and there was a disk herniation extending laterally into the foramen.  I performed an annulotomy and disk material was removed from the disk space with a straight pituitary, mobilized with an Epstein, and retrieved with a micro pituitary from both sides of the operating room table.  Following this, there was no compression on the 4 or the 5 root.  The 5 root had 1 cm of excursion, the medial pedicle without tension.  Hockey-stick probe passed freely up the foramen of 4 and 5.  This space copiously irrigated with irrigation. Bipolar electrocautery was utilized to achieve hemostasis.  There was no evidence of CSF leakage or active bleeding.  Next, McCullough retractor was removed, irrigated, no evidence of active bleeding.  We repaired the dorsolumbar fascia with #  1 Vicryl, subcu 2-0, skin subcuticular Prolene. Wound reinforced with Steri-Strips.  Sterile dressing applied.  Placed supine on hospital bed, extubated without difficulty, and transported to recovery in a satisfactory condition.  The patient tolerated the procedure well.  No complications.  Assistant Lanna Poche, [place].  With the help of the operating microscope, there was no nerve root retraction and intermittent suction.   Minimal blood loss.     Jene Every, M.D.     Cordelia Pen  D:  11/30/2012  T:  11/30/2012  Job:  [id number]

## 2012-11-30 NOTE — H&P (Signed)
Dominique Evans is an 47 y.o. female.   Chief Complaint: Left leg pain HPI: HNP Stenosis l45 left refractory.  Past Medical History  Diagnosis Date  . Unspecified essential hypertension   . Hyperlipidemia   . GERD (gastroesophageal reflux disease)   . 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 Chronic constipation   . Obesity   . Asthma     PFT 08/12/07: FEV1 2.97 (104%), FEV1% 84, TLC 4.89 (90%), DLCO 84%, +BD  . Allergic rhinitis   . Panic attacks   . Hydatidiform mole   . Diabetes mellitus   . Visual disturbance   . PONV (postoperative nausea and vomiting)   . Anemia   . Blood transfusion     hx of 2002   . 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 Depression   . Psoriasis     Past Surgical History  Procedure Laterality Date  . Nephrectomy  2002    left  . Elbow fracture surgery      left  . Shoulder arthroscopy      right shoulder  . Laparoscopic gastric banding  02/2006    w/ truncal vagotomy  . Patent ductus arterious repair    . Lap band removal     . Gastric roux-en-y  10/05/2011    Procedure: LAPAROSCOPIC ROUX-EN-Y GASTRIC;  Surgeon: Mariella Saa, MD;  Location: [facility];  Service: General;  Laterality: N/A;  UPPER ENDOSCOPY    Family History  Problem Relation Age of Onset  . Hypertension Father   . Skin cancer Father   . Cancer Father     melanoma  . Diabetes Mother   . Hypertension Mother   . Skin cancer Mother   . Cancer Mother     melanoma  . Hypertension Sister   . Heart disease Sister   . Breast cancer      aunt  . Cancer Maternal Aunt     breast   Social History:  reports that she quit smoking about 30 years ago. Her smoking use included Cigarettes. She smoked 2.00 packs per day. She has never used smokeless tobacco. She reports that she does not drink alcohol or use illicit drugs.  Allergies:  Allergies  Allergen Reactions  . Penicillins Anaphylaxis  . Sulfonamide  Derivatives Anaphylaxis    Medications Prior to Admission  Medication Sig Dispense Refill  . amitriptyline (ELAVIL) 25 MG tablet Take 50 mg by mouth at bedtime. Take 1 tablet by mouth once daily      . CALCIUM PO Take 600 mg by mouth daily.       . Clobetasol Propionate (TEMOVATE) 0.05 % external spray Apply 1 application topically daily as needed. psoriasis      . Cyanocobalamin (VITAMIN B12 PO) Take 2 capsules by mouth daily.       Marland Kitchen desvenlafaxine (PRISTIQ) 50 MG 24 hr tablet Take 50 mg by mouth every morning.       Marland Kitchen dexlansoprazole (DEXILANT) 60 MG capsule Take 60 mg by mouth daily.       . ferrous fumarate (HEMOCYTE - 106 MG FE) 325 (106 FE) MG TABS Take 1 tablet by mouth daily.       . Fluticasone-Salmeterol (ADVAIR DISKUS) 100-50 MCG/DOSE AEPB Inhale 1 puff into the lungs 2 (two) times daily.  180 each  3  . furosemide (LASIX) 20 MG tablet Take 20 mg by mouth  every morning.       Marland Kitchen losartan (COZAAR) 50 MG tablet Take 50 mg by mouth every morning.       . montelukast (SINGULAIR) 10 MG tablet 10 mg. Take 1 tablet by mouth once daily      . Multiple Vitamins-Minerals (MULTIVITAMIN PO) Take 1 tablet by mouth daily.       . polyethylene glycol (MIRALAX) powder Take 17 g by mouth daily as needed. Mix 1 capful in 8 ounces of water daily      . PROAIR HFA 108 (90 BASE) MCG/ACT inhaler Inhale 2 puffs into the lungs every 4 (four) hours as needed. WHEEZING      . rosuvastatin (CRESTOR) 10 MG tablet Take 5 mg by mouth every morning.       . verapamil (COVERA HS) 240 MG (CO) 24 hr tablet Take 240 mg by mouth every morning. Take 1 tablet by mouth once daily      . adalimumab (HUMIRA PEN) 40 MG/0.8ML injection Inject 40 mg into the skin once. Every other week  Last injection on 11/24/12      . oxyCODONE (ROXICODONE) 5 MG/5ML solution Take 5 mg by mouth every 4 (four) hours as needed. For pain      . progesterone (PROMETRIUM) 200 MG capsule Take 200 mg by mouth daily. Start on 20th of every month and  use for 10 or 12 days        Results for orders placed during the hospital encounter of 11/30/12 (from the past 48 hour(s))  GLUCOSE, CAPILLARY     Status: None   Collection Time    11/30/12  6:53 AM      Result Value Range   Glucose-Capillary 96  70 - 99 mg/dL   Comment 1 Documented in Chart     No results found.  Review of Systems  Neurological: Positive for sensory change and focal weakness.  All other systems reviewed and are negative.    Blood pressure 131/81, pulse 74, temperature 97.9 F (36.6 C), resp. rate 20, last menstrual period 10/23/2012, SpO2 99.00%. Physical Exam  Constitutional: She is oriented to person, place, and time. She appears well-developed.  HENT:  Head: Normocephalic.  Eyes: Pupils are equal, round, and reactive to light.  Neck: Normal range of motion.  Cardiovascular: Normal rate.   Respiratory: Effort normal.  GI: Soft.  Musculoskeletal:  +SLR left.EHL 5-/5. No DVT. Decreased sensation L5 left.  Neurological: She is alert and oriented to person, place, and time.  Skin: Skin is warm and dry.  Psychiatric: She has a normal mood and affect.   MRI stenosis HNP L45 left.  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 Plan Micro lumbar decompression. Risks discussed.   Remmy Riffe C 11/30/2012, 7:53 AM

## 2012-11-30 NOTE — Anesthesia Preprocedure Evaluation (Signed)
Anesthesia Evaluation  Patient identified by MRN, date of birth, ID band Patient awake    Reviewed: Allergy & Precautions, H&P , NPO status , Patient's Chart, lab work & pertinent test results  History of Anesthesia Complications (+) PONV  Airway Mallampati: II TM Distance: >3 FB Neck ROM: full    Dental no notable dental hx. (+) Teeth Intact and Dental Advisory Given   Pulmonary asthma ,  breath sounds clear to auscultation  Pulmonary exam normal       Cardiovascular Exercise Tolerance: Good hypertension, Pt. on medications Rhythm:regular Rate:Normal     Neuro/Psych negative neurological ROS  negative psych ROS   GI/Hepatic negative GI ROS, Neg liver ROS, GERD-  Controlled,  Endo/Other  diabetes, Well Controlled, Type obesityDiet controlled DM  Renal/GU negative Renal ROS  negative genitourinary   Musculoskeletal   Abdominal (+) + obese,   Peds  Hematology negative hematology ROS (+)   Anesthesia Other Findings   Reproductive/Obstetrics negative OB ROS                           Anesthesia Physical Anesthesia Plan  ASA: III  Anesthesia Plan: General   Post-op Pain Management:    Induction: Intravenous  Airway Management Planned: Oral ETT  Additional Equipment:   Intra-op Plan:   Post-operative Plan: Extubation in OR  Informed Consent: I have reviewed the patients History and Physical, chart, labs and discussed the procedure including the risks, benefits and alternatives for the proposed anesthesia with the patient or authorized representative who has indicated his/her understanding and acceptance.   Dental Advisory Given  Plan Discussed with: CRNA and Surgeon  Anesthesia Plan Comments:         Anesthesia Quick Evaluation

## 2012-12-01 LAB — GLUCOSE, CAPILLARY
Glucose-Capillary: 132 mg/dL — ABNORMAL HIGH (ref 70–99)
Glucose-Capillary: 105 mg/dL — ABNORMAL HIGH (ref 70–99)
Glucose-Capillary: 90 mg/dL (ref 70–99)

## 2012-12-01 LAB — BASIC METABOLIC PANEL
BUN: 11 mg/dL (ref 6–23)
CO2: 27 mEq/L (ref 19–32)
Calcium: 8.9 mg/dL (ref 8.4–10.5)
Chloride: 99 mEq/L (ref 96–112)
Creatinine, Ser: 0.77 mg/dL (ref 0.50–1.10)
GFR calc non Af Amer: >90 mL/min (ref >90)
Potassium: 4.3 mEq/L (ref 3.5–5.1)

## 2012-12-01 NOTE — Evaluation (Signed)
Occupational Therapy Evaluation Patient Details Name: Dominique Evans MRN: [id number] DOB: [birth year] Today's Date: 12/01/2012 Time: 7829-5621 OT Time Calculation (min): 19 min  OT Assessment / Plan / Recommendation Clinical Impression  This 47 year old female was admitted for L4-5 decompression.  She will benefit from continued OT to educate her on following back precautions during adls.      OT Assessment  Patient needs continued OT Services    Follow Up Recommendations  No OT follow up    Barriers to Discharge      Equipment Recommendations   (to be further assessed, 3:1 vs. none)    Recommendations for Other Services    Frequency  Min 2X/week    Precautions / Restrictions Precautions Precautions: Back Precaution Booklet Issued: Yes (comment) Restrictions Weight Bearing Restrictions: No   Pertinent Vitals/Pain Back min pain:  Pt fatiqued.      ADL  Transfers/Ambulation Related to ADLs: Ambulated with PT with min A--just back to bed and had just used bathroom.  Will return prior to discharge to see if pt will be able to access standard commode safely.   ADL Comments: Educated on AE and pt used sock aid.  Educated on AE kit as well as toilet aid.  Pt has tub--borrowed seat will not fit in it. Educated about suction cup grab bar and to have it reapplied every day by son.     OT Diagnosis: Generalized weakness  OT Problem List: Decreased activity tolerance;Decreased knowledge of use of DME or AE;Decreased knowledge of precautions;Pain OT Treatment Interventions: Self-care/ADL training;DME and/or AE instruction;Patient/family education   OT Goals Acute Rehab OT Goals OT Goal Formulation: With patient Time For Goal Achievement: 12/08/12 Potential to Achieve Goals: Good ADL Goals Pt Will Transfer to Toilet: with supervision;Ambulation;Regular height toilet ADL Goal: Toilet Transfer - Progress: Goal set today Pt Will Perform Toileting - Hygiene: with supervision;Standing  at 3-in-1/toilet ADL Goal: Toileting - Hygiene - Progress: Goal set today Pt Will Perform Tub/Shower Transfer: with min assist;Anterior-posterior transfer;Tub transfer (min guard) ADL Goal: Tub/Shower Transfer - Progress: Goal set today Miscellaneous OT Goals Miscellaneous OT Goal #1: Pt will follow back precautions during adl routine without cues OT Goal: Miscellaneous Goal #1 - Progress: Goal set today  Visit Information  Last OT Received On: 12/01/12 Assistance Needed: +1    Subjective Data  Subjective: I just got a reacher Patient Stated Goal: be as independent as possible   Prior Functioning     Home Living Lives With: Family Bathroom Shower/Tub: Engineer, manufacturing systems: Standard Home Adaptive Equipment: Straight cane Prior Function Level of Independence: Independent Vocation: Full time employment Communication Communication: No difficulties         Vision/Perception     Copywriter, advertising Overall Cognitive Status: Appears within functional limits for tasks assessed/performed Arousal/Alertness: Awake/alert Orientation Level: Appears intact for tasks assessed Behavior During Session: [facility] for tasks performed    Extremity/Trunk Assessment Right Upper Extremity Assessment RUE ROM/Strength/Tone: [facility] for tasks assessed Left Upper Extremity Assessment LUE ROM/Strength/Tone: WFL for tasks assessed     Mobility       Exercise     Balance     End of Session OT - End of Session Activity Tolerance: Patient tolerated treatment well Patient left: in bed;with call bell/phone within reach  GO Functional Assessment Tool Used: clinical judgment Functional Limitation: Self care Self Care Current Status (H0865): At least 60 percent but less than 80 percent impaired, limited or restricted Self Care Goal Status (H8469):  At least 1 percent but less than 20 percent impaired, limited or restricted   [facility] 12/01/2012, 9:11 AM Marica Otter,  OTR/L [phone] 12/01/2012

## 2012-12-01 NOTE — Progress Notes (Signed)
ANTIBIOTIC CONSULT NOTE   Pharmacy Consult for Vancomycin  Allergies  Allergen Reactions  . Penicillins Anaphylaxis  . Sulfonamide Derivatives Anaphylaxis    Patient Measurements: Height: 5\' 6"  (167.6 cm) Weight: 240 lb (108.863 kg) IBW/kg (Calculated) : 59.3  Vital Signs: Temp: 98 F (36.7 C) (02/13 0522) Temp src: Oral (02/13 0522) BP: 157/89 mmHg (02/13 0522) Pulse Rate: 76 (02/13 0522) Intake/Output from previous day: 02/12 0701 - 02/13 0700 In: 3827.5 [P.O.:700; I.V.:2877.5; IV Piggyback:250] Out: 3450 [Urine:3400; Blood:50] Intake/Output from this shift:    Labs:  Recent Labs  12/01/12 0430  CREATININE 0.77   Estimated Creatinine Clearance: 107.4 ml/min (by C-G formula based on Cr of 0.77). No results found for this basename: VANCOTROUGH, VANCOPEAK, VANCORANDOM, GENTTROUGH, GENTPEAK, GENTRANDOM, TOBRATROUGH, TOBRAPEAK, TOBRARND, AMIKACINPEAK, AMIKACINTROU, AMIKACIN,  in the last 72 hours   Microbiology: No results found for this or any previous visit (from the past 720 hour(s)).  Medical History: Past Medical History  Diagnosis Date  . Unspecified essential hypertension   . Hyperlipidemia   . GERD (gastroesophageal reflux disease)   . 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 Chronic constipation   . Obesity   . Asthma     PFT 08/12/07: FEV1 2.97 (104%), FEV1% 84, TLC 4.89 (90%), DLCO 84%, +BD  . Allergic rhinitis   . Panic attacks   . Hydatidiform mole   . Diabetes mellitus   . Visual disturbance   . PONV (postoperative nausea and vomiting)   . Anemia   . Blood transfusion     hx of 2002   . 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 Depression   . Psoriasis     Assessment:  46 yof with herniated nucleus polpusus and lumbar stenosis s/p L lumbar decompression, foraminotomies and microdiskectomy (L4-5) on 2/12.  Patient received Vancomycin 1500 mg IV x 1 pre-op (@0730  this  morning).  MD ordered for Vancomycin 1500 mg IV q12h post-op with comment for pharmacy to dose.    Unable to clarify with Dr. Shelle Iron if Vancomycin was intended to continue past 24 hours or just a one time dose post-op.   Dr. Ermelinda Das note today (2/13) does not mention Vancomycin therapy, but does note d/c today or tomorrow, depending on weather.  No mention of Vancomycin in d/c instructions/orders.    Plan:   D/C Vancomycin as unable to follow-up with Dr. Shelle Iron, and pt has plans for d/c today or tomorrow when able  Haynes Hoehn, PharmD 12/01/2012 8:49 AM  Pager: [phone]

## 2012-12-01 NOTE — Progress Notes (Signed)
Physical Therapy Treatment Patient Details Name: Dominique Evans MRN: [id number] DOB: 1966-03-10 Today's Date: 12/01/2012 Time: 4098-1191 PT Time Calculation (min): 15 min  PT Assessment / Plan / Recommendation Comments on Treatment Session  Pt. tolerated. ambulation well. will need RW.    Follow Up Recommendations  No PT follow up     Does the patient have the potential to tolerate intense rehabilitation     Barriers to Discharge        Equipment Recommendations  Rolling walker with 5" wheels    Recommendations for Other Services    Frequency 7X/week   Plan Discharge plan remains appropriate    Precautions / Restrictions Precautions Precautions: Back Precaution Booklet Issued: Yes (comment) Restrictions Weight Bearing Restrictions: No   Pertinent Vitals/Pain     Mobility  Bed Mobility Rolling Left: 5: Supervision Left Sidelying to Sit: 5: Supervision Details for Bed Mobility Assistance: cues for correct LOG roll technique and sequencing Transfers Sit to Stand: 5: Supervision Stand to Sit: 5: Supervision Details for Transfer Assistance: cues for LE positioning, use of UEs to assist , and minimizing fwd flex Ambulation/Gait Ambulation/Gait Assistance: 4: Min guard Ambulation Distance (Feet): 100 Feet Assistive device: Rolling walker Gait Pattern: Within Functional Limits Gait velocity: decr    Exercises     PT Diagnosis:    PT Problem List:   PT Treatment Interventions:     PT Goals Acute Rehab PT Goals Pt will go Supine/Side to Sit: with supervision PT Goal: Supine/Side to Sit - Progress: Met Pt will go Sit to Stand: with modified independence PT Goal: Sit to Stand - Progress: Updated due to goal met Pt will go Stand to Sit: with modified independence PT Goal: Stand to Sit - Progress: Updated due to goals met Pt will Ambulate: >150 feet;with supervision PT Goal: Ambulate - Progress: Progressing toward goal  Visit Information  Last PT Received On:  12/01/12 Assistance Needed: +1    Subjective Data  Subjective: I need to get up and walk Patient Stated Goal: Back to work with decreased pain   Cognition  Cognition Overall Cognitive Status: Appears within functional limits for tasks assessed/performed Arousal/Alertness: Awake/alert Orientation Level: Appears intact for tasks assessed Behavior During Session: [facility] for tasks performed    Balance     End of Session PT - End of Session Activity Tolerance: Patient tolerated treatment well Patient left: in chair;with call bell/phone within reach Nurse Communication: Mobility status   GP     Rada Hay 12/01/2012, 9:48 AM [phone]

## 2012-12-01 NOTE — Progress Notes (Signed)
Physical Therapy Treatment Patient Details Name: Dominique Evans MRN: [id number] DOB: 1966-01-08 Today's Date: 12/01/2012 Time: 6213-0865 PT Time Calculation (min): 10 min  PT Assessment / Plan / Recommendation Comments on Treatment Session  Pt. reports feeling dizzy and asssited back to bed.    Follow Up Recommendations  No PT follow up     Does the patient have the potential to tolerate intense rehabilitation     Barriers to Discharge        Equipment Recommendations  Rolling walker with 5" wheels    Recommendations for Other Services    Frequency 7X/week   Plan Discharge plan remains appropriate    Precautions / Restrictions Precautions Precautions: Back        Mobility  Bed Mobility Bed Mobility: Sit to Supine Sit to Supine: 5: Supervision;HOB flat Details for Bed Mobility Assistance: cues for correct LOG roll technique and sequencing Transfers Transfers: Sit to Stand;Stand to Sit Sit to Stand: 5: Supervision;From elevated surface;With upper extremity assist;From chair/3-in-1 Stand to Sit: 5: Supervision;To bed Details for Transfer Assistance: cues for LE positioning, use of UEs to assist , and minimizing fwd flex Ambulation/Gait Ambulation/Gait Assistance: 5: Supervision Ambulation Distance (Feet): 5 Feet Assistive device: Rolling walker    Exercises     PT Diagnosis:    PT Problem List:   PT Treatment Interventions:     PT Goals Acute Rehab PT Goals Pt will go Sit to Supine/Side: with supervision PT Goal: Sit to Supine/Side - Progress: Progressing toward goal Pt will go Sit to Stand: with modified independence PT Goal: Sit to Stand - Progress: Progressing toward goal Pt will go Stand to Sit: with modified independence PT Goal: Stand to Sit - Progress: Progressing toward goal Pt will Ambulate: >150 feet;with supervision PT Goal: Ambulate - Progress: Progressing toward goal  Visit Information  Last PT Received On: 12/01/12 Assistance Needed: +1   Subjective Data  Subjective: I feel dizzy,   Cognition       Balance     End of Session PT - End of Session Activity Tolerance: Patient limited by fatigue Patient left: in bed   GP     Rada Hay 12/01/2012, 3:22 PM

## 2012-12-01 NOTE — Plan of Care (Signed)
Problem: Consults Goal: Diabetes Guidelines if Diabetic/Glucose > 140 If diabetic or lab glucose is > 140 mg/dl - Initiate Diabetes/Hyperglycemia Guidelines & Document Interventions  Outcome: Not Applicable Date Met:  12/01/12 Patient states that the diabetes was resolved with pasted surgery.

## 2012-12-01 NOTE — Progress Notes (Signed)
Subjective: 1 Day Post-Op Procedure(s) (LRB): MICRO LUMBAR DECOMPRESSION L4-5 ON THE LEFT (Left) Patient reports pain as 3 on 0-10 scale.    Objective: Vital signs in last 24 hours: Temp:  [97.3 F (36.3 C)-98.5 F (36.9 C)] 98 F (36.7 C) (02/13 0522) Pulse Rate:  [63-97] 76 (02/13 0522) Resp:  [9-16] 14 (02/13 0522) BP: (116-157)/(57-89) 157/89 mmHg (02/13 0522) SpO2:  [95 %-100 %] 96 % (02/13 0522) Weight:  [108.863 kg (240 lb)] 108.863 kg (240 lb) (02/12 1145)  Intake/Output from previous day: 02/12 0701 - 02/13 0700 In: 3827.5 [P.O.:700; I.V.:2877.5; IV Piggyback:250] Out: 3450 [Urine:3400; Blood:50] Intake/Output this shift:    No results found for this basename: HGB,  in the last 72 hours No results found for this basename: WBC, RBC, HCT, PLT,  in the last 72 hours  Recent Labs  12/01/12 0430  NA 135  K 4.3  CL 99  CO2 27  BUN 11  CREATININE 0.77  GLUCOSE 149*  CALCIUM 8.9   No results found for this basename: LABPT, INR,  in the last 72 hours  Neurologically intact Neurovascular intact Compartment soft No DVT  Assessment/Plan: 1 Day Post-Op Procedure(s) (LRB): MICRO LUMBAR DECOMPRESSION L4-5 ON THE LEFT (Left) Advance diet Up with therapy Plan for discharge tomorrow or today if weather permits.  Dominique Evans C 12/01/2012, 7:40 AM

## 2012-12-02 ENCOUNTER — Encounter (HOSPITAL_COMMUNITY): Payer: Self-pay | Admitting: Specialist

## 2012-12-02 NOTE — Progress Notes (Signed)
Subjective: 2 Days Post-Op Procedure(s) (LRB): MICRO LUMBAR DECOMPRESSION L4-5 ON THE LEFT (Left) Patient reports pain as 2 on 0-10 scale.    Objective: Vital signs in last 24 hours: Temp:  [98 F (36.7 C)-98.3 F (36.8 C)] 98.3 F (36.8 C) (02/14 0428) Pulse Rate:  [58-74] 58 (02/14 0428) Resp:  [14-16] 16 (02/14 0428) BP: (132-138)/(75-84) 138/84 mmHg (02/14 0428) SpO2:  [95 %-100 %] 96 % (02/14 0854)  Intake/Output from previous day: 02/13 0701 - 02/14 0700 In: 960 [P.O.:960] Out: 2200 [Urine:2200] Intake/Output this shift:    No results found for this basename: HGB,  in the last 72 hours No results found for this basename: WBC, RBC, HCT, PLT,  in the last 72 hours  Recent Labs  12/01/12 0430  NA 135  K 4.3  CL 99  CO2 27  BUN 11  CREATININE 0.77  GLUCOSE 149*  CALCIUM 8.9   No results found for this basename: LABPT, INR,  in the last 72 hours  Neurologically intact Neurovascular intact Dorsiflexion/Plantar flexion intact Incision: dressing C/D/I  Assessment/Plan: 2 Days Post-Op Procedure(s) (LRB): MICRO LUMBAR DECOMPRESSION L4-5 ON THE LEFT (Left) Discharge home with home health Instructions given  Dominique Evans C 12/02/2012, 9:13 AM

## 2012-12-02 NOTE — Progress Notes (Signed)
Occupational Therapy Treatment Patient Details Name: Dominique Evans MRN: [id number] DOB: 12-31-1965 Today's Date: 12/02/2012 Time: 4098-1191 OT Time Calculation (min): 8 min  OT Assessment / Plan / Recommendation Comments on Treatment Session      Follow Up Recommendations  No OT follow up    Barriers to Discharge       Equipment Recommendations  None recommended by OT    Recommendations for Other Services    Frequency     Plan      Precautions / Restrictions Precautions Precautions: Back Restrictions Weight Bearing Restrictions: No   Pertinent Vitals/Pain No pain--soreness in back. Premedicated    ADL  Toilet Transfer: Simulated;Supervision/safety;Minimal assistance (min for sit to stand, low surface) Toilet Transfer Method: Sit to stand Tub/Shower Transfer: Simulated;Min guard Tub/Shower Transfer Method: Ambulating Transfers/Ambulation Related to ADLs: Simulated regular commode with 15" shower seat.  Min A to stand up; supervision getting down to this.  Pt will have help. Also gave her strategy of sitting backwards. ADL Comments: Family will help with dressing (min A) initiially. Will be able to step over tub:  simulated to practice.  Family picking up clamp on grab bar    OT Diagnosis:    OT Problem List:   OT Treatment Interventions:     OT Goals ADL Goals Pt Will Transfer to Toilet: with supervision;Ambulation;Regular height toilet ADL Goal: Toilet Transfer - Progress: Partly met Pt Will Perform Tub/Shower Transfer: with min assist;Tub transfer;Ambulation ADL Goal: Tub/Shower Transfer - Progress: Met Miscellaneous OT Goals Miscellaneous OT Goal #1: Pt will follow back precautions during adl routine without cues OT Goal: Miscellaneous Goal #1 - Progress: Met  Visit Information  Last OT Received On: 12/02/12 Assistance Needed: +1    Subjective Data      Prior Functioning       Cognition  Cognition Overall Cognitive Status: Appears within functional  limits for tasks assessed/performed Behavior During Session: [facility] for tasks performed    Mobility  Transfers Sit to Stand: 5: Supervision;From elevated surface;With upper extremity assist;From chair/3-in-1    Exercises      Balance     End of Session OT - End of Session Activity Tolerance: Patient tolerated treatment well Patient left: in bed;with call bell/phone within reach  GO     [facility] 12/02/2012, 9:34 AM Marica Otter, OTR/L [phone] 12/02/2012

## 2012-12-02 NOTE — Discharge Summary (Signed)
Physician Discharge Summary   Patient ID: Dominique Evans MRN: [id number] DOB/AGE: 01/06/1966 47 y.o.  Admit date: 11/30/2012 Discharge date: 12/02/2012  Primary Diagnosis:   H&P STENOSIS L4-5  Admission Diagnoses:  Past Medical History  Diagnosis Date  . Unspecified essential hypertension   . Hyperlipidemia   . GERD (gastroesophageal reflux disease)   . 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 Chronic constipation   . Obesity   . Asthma     PFT 08/12/07: FEV1 2.97 (104%), FEV1% 84, TLC 4.89 (90%), DLCO 84%, +BD  . Allergic rhinitis   . Panic attacks   . Hydatidiform mole   . Diabetes mellitus   . Visual disturbance   . PONV (postoperative nausea and vomiting)   . Anemia   . Blood transfusion     hx of 2002   . 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 Depression   . Psoriasis    Discharge Diagnoses:   Principal Problem:   HNP (herniated nucleus pulposus), lumbar  Procedure:  Procedure(s) (LRB): MICRO LUMBAR DECOMPRESSION L4-5 ON THE LEFT (Left)   Consults: None  HPI:  See notes    Laboratory Data: Hospital Outpatient Visit on 11/25/2012  Component Date Value Range Status  . MRSA, PCR 11/25/2012 NEGATIVE  NEGATIVE Final  . Staphylococcus aureus 11/25/2012 NEGATIVE  NEGATIVE Final   Comment:                                 The Xpert SA Assay (FDA                          approved for NASAL specimens                          in patients over 71 years of age),                          is one component of                          a comprehensive surveillance                          program.  Test performance has                          been validated by [employer] for patients greater                          than or equal to 64 year old.                          It is not intended                          to diagnose infection nor to   guide or monitor treatment.  . Sodium 11/25/2012 140  135 - 145 mEq/L Final  .  Potassium 11/25/2012 3.7  3.5 - 5.1 mEq/L Final  . Chloride 11/25/2012 101  96 - 112 mEq/L Final  . CO2 11/25/2012 30  19 - 32 mEq/L Final  . Glucose, Bld 11/25/2012 101* 70 - 99 mg/dL Final  . BUN 16/07/9603 15  6 - 23 mg/dL Final  . Creatinine, Ser 11/25/2012 0.98  0.50 - 1.10 mg/dL Final  . Calcium 54/06/8118 9.4  8.4 - 10.5 mg/dL Final  . GFR calc non Af Amer 11/25/2012 67* >90 mL/min Final  . GFR calc Af Amer 11/25/2012 78* >90 mL/min Final   Comment:                                 The eGFR has been calculated                          using the CKD EPI equation.                          This calculation has not been                          validated in all clinical                          situations.                          eGFR's persistently                          <90 mL/min signify                          possible Chronic Kidney Disease.  . WBC 11/25/2012 6.5  4.0 - 10.5 K/uL Final  . RBC 11/25/2012 4.46  3.87 - 5.11 MIL/uL Final  . Hemoglobin 11/25/2012 12.6  12.0 - 15.0 g/dL Final  . HCT 14/78/2956 38.1  36.0 - 46.0 % Final  . MCV 11/25/2012 85.4  78.0 - 100.0 fL Final  . MCH 11/25/2012 28.3  26.0 - 34.0 pg Final  . MCHC 11/25/2012 33.1  30.0 - 36.0 g/dL Final  . RDW 21/30/8657 12.4  11.5 - 15.5 % Final  . Platelets 11/25/2012 221  150 - 400 K/uL Final  . Preg, Serum 11/25/2012 NEGATIVE  NEGATIVE Final   Comment:                                 THE SENSITIVITY OF THIS                          METHODOLOGY IS >10 mIU/mL.   No results found for this basename: HGB,  in the last 72 hours No results found for this basename: WBC, RBC, HCT, PLT,  in the last 72 hours  Recent Labs  12/01/12 0430  NA 135  K 4.3  CL 99  CO2 27  BUN 11  CREATININE 0.77  GLUCOSE 149*  CALCIUM 8.9   No results found for this basename: LABPT, INR,  in the last 72 hours  X-Rays:Dg Chest 2  View  11/25/2012  *  RADIOLOGY REPORT*  Clinical Data: Hypertension.  CHEST - 2 VIEW  Comparison: August 13, 2011  Findings: Cardiomediastinal silhouette appears normal.  No acute pulmonary disease is noted.  Bony thorax is intact.  IMPRESSION: No acute cardiopulmonary abnormality seen.   Original Report Authenticated By: Lupita Raider.,  M.D.    Dg Lumbar Spine 2-3 Views  11/25/2012  *RADIOLOGY REPORT*  Clinical Data: 47 year old female preoperative study for spine surgery.  LUMBAR SPINE - 2-3 VIEW  Comparison: Abdominal series 12/27/2010 andearlier.  Findings: Postoperative changes to the left 12th rib appear related to previous nephrectomy. Staple line at the gastroesophageal junction.  Gastric band device no longer in place.  Extensive left retroperitoneal and abdominal surgical clips.  Lumbar segmentation is normal.  Stable vertebral height and alignment and 2011. Stippled hyperdense material retained in the right colon.  IMPRESSION: Normal lumbar segmentation.  No acute findings in the lumbar spine. Previous partial resection of the left 12th rib. The lumbar levels on these images were numbered in anticipation of surgery.   Original Report Authenticated By: Erskine Speed, M.D.    Dg Spine Portable 1 View  11/30/2012  *RADIOLOGY REPORT*  Clinical Data: Intraoperative image.  PORTABLE SPINE - 1 VIEW  Comparison: 11/30/2012.  Findings: A single lateral intraoperative view of the lumbar spine again demonstrates a soft tissue retractor in place posterior to the L4-L5 interspace.  There has been interval advancement of the surgical probe into the L4-L5 interspace.  The appearance of the lumbar spine is otherwise unchanged.  IMPRESSION: 1.  Intraoperative localization, as above.   Original Report Authenticated By: Trudie Reed, M.D.    Dg Spine Portable 1 View  11/30/2012  *RADIOLOGY REPORT*  Clinical Data: Intraoperative film.  PORTABLE SPINE - 1 VIEW  Comparison: 11/30/2012.  Findings: A single  intraoperative lateral view of the lumbar spine now demonstrates a soft tissue retractor and a surgical probe in place posterior to the L4-L5 interspace.  Incidental imaging of the lower thoracic spine is remarkable for compression fractures at T11 and T12 with approximately 10% loss of height anteriorly at T11 and approximately 25% loss of height anteriorly at T12.  IMPRESSION: 1.  Intraoperative localization, as above.   Original Report Authenticated By: Trudie Reed, M.D.    Dg Spine Portable 1 View  11/30/2012  *RADIOLOGY REPORT*  Clinical Data: Intraoperative film.  PORTABLE SPINE - 1 VIEW  Comparison: 11/25/2012.  Findings: Single lateral intraoperative view of the lumbar spine demonstrates surgical probes in place posterior to the L3-L4 and L4- L5 interspaces.  IMPRESSION: 1.  Intraoperative localization, as above.   Original Report Authenticated By: Trudie Reed, M.D.     EKG: Orders placed during the hospital encounter of 11/25/12  . EKG 12-LEAD  . EKG 12-LEAD     Hospital Course: Patient was admitted to [facility] and taken to the OR and underwent the above state procedure without complications.  Patient tolerated the procedure well and was later transferred to the recovery room and then to the orthopaedic floor for postoperative care.  They were given PO and IV analgesics for pain control following their surgery.  They were given 24 hours of postoperative antibiotics.   PT was consulted postop to assist with mobility and transfers.  The patient was allowed to be WBAT with therapy and was taught back precautions. Discharge planning was consulted to help with postop disposition and equipment needs.  Patient had a fair night on the evening of surgery and started to  get up OOB with therapy on day one. Patient was seen in rounds and was ready to go home on day one.  They were given discharge instructions and dressing directions.  They were instructed on when to follow up in the  office with Dr. Shelle Iron.  Discharge Medications: Prior to Admission medications   Medication Sig Start Date End Date Taking? Authorizing Provider  amitriptyline (ELAVIL) 25 MG tablet Take 50 mg by mouth at bedtime. Take 1 tablet by mouth once daily   Yes Historical Provider, MD  CALCIUM PO Take 600 mg by mouth daily.    Yes Historical Provider, MD  Clobetasol Propionate (TEMOVATE) 0.05 % external spray Apply 1 application topically daily as needed. psoriasis   Yes Historical Provider, MD  Cyanocobalamin (VITAMIN B12 PO) Take 2 capsules by mouth daily.    Yes Historical Provider, MD  desvenlafaxine (PRISTIQ) 50 MG 24 hr tablet Take 50 mg by mouth every morning.    Yes Historical Provider, MD  dexlansoprazole (DEXILANT) 60 MG capsule Take 60 mg by mouth daily.    Yes Historical Provider, MD  ferrous fumarate (HEMOCYTE - 106 MG FE) 325 (106 FE) MG TABS Take 1 tablet by mouth daily.    Yes Historical Provider, MD  Fluticasone-Salmeterol (ADVAIR DISKUS) 100-50 MCG/DOSE AEPB Inhale 1 puff into the lungs 2 (two) times daily. 01/11/12 01/10/13 Yes Coralyn Helling, MD  furosemide (LASIX) 20 MG tablet Take 20 mg by mouth every morning.    Yes Historical Provider, MD  losartan (COZAAR) 50 MG tablet Take 50 mg by mouth every morning.  09/21/11  Yes Historical Provider, MD  montelukast (SINGULAIR) 10 MG tablet 10 mg. Take 1 tablet by mouth once daily   Yes Historical Provider, MD  Multiple Vitamins-Minerals (MULTIVITAMIN PO) Take 1 tablet by mouth daily.    Yes Historical Provider, MD  polyethylene glycol (MIRALAX) powder Take 17 g by mouth daily as needed. Mix 1 capful in 8 ounces of water daily   Yes Historical Provider, MD  PROAIR HFA 108 (90 BASE) MCG/ACT inhaler Inhale 2 puffs into the lungs every 4 (four) hours as needed. WHEEZING 08/26/11  Yes Historical Provider, MD  rosuvastatin (CRESTOR) 10 MG tablet Take 5 mg by mouth every morning.    Yes Historical Provider, MD  verapamil (COVERA HS) 240 MG (CO) 24 hr  tablet Take 240 mg by mouth every morning. Take 1 tablet by mouth once daily   Yes Historical Provider, MD  adalimumab (HUMIRA PEN) 40 MG/0.8ML injection Inject 40 mg into the skin once. Every other week  Last injection on 11/24/12    Historical Provider, MD  methocarbamol (ROBAXIN) 500 MG tablet Take 1 tablet (500 mg total) by mouth 3 (three) times daily. 11/30/12   Javier Docker, MD  oxyCODONE (ROXICODONE) 5 MG/5ML solution Take 5 mg by mouth every 4 (four) hours as needed. For pain    Historical Provider, MD  oxyCODONE-acetaminophen (PERCOCET) 7.5-325 MG per tablet Take 1-2 tablets by mouth every 4 (four) hours as needed for pain. 11/30/12   Javier Docker, MD  progesterone (PROMETRIUM) 200 MG capsule Take 200 mg by mouth daily. Start on 20th of every month and use for 10 or 12 days    Historical Provider, MD    Diet: Regular diet Activity:WBAT Follow-up:in 10 days Disposition - Home Discharged Condition: good      Medication List    TAKE these medications       methocarbamol 500 MG tablet  Commonly known  as:  ROBAXIN  Take 1 tablet (500 mg total) by mouth 3 (three) times daily.     oxyCODONE-acetaminophen 7.5-325 MG per tablet  Commonly known as:  PERCOCET  Take 1-2 tablets by mouth every 4 (four) hours as needed for pain.      ASK your doctor about these medications       amitriptyline 25 MG tablet  Commonly known as:  ELAVIL  Take 50 mg by mouth at bedtime. Take 1 tablet by mouth once daily     CALCIUM PO  Take 600 mg by mouth daily.     Clobetasol Propionate 0.05 % external spray  Commonly known as:  TEMOVATE  Apply 1 application topically daily as needed. psoriasis     desvenlafaxine 50 MG 24 hr tablet  Commonly known as:  PRISTIQ  Take 50 mg by mouth every morning.     DEXILANT 60 MG capsule  Generic drug:  dexlansoprazole  Take 60 mg by mouth daily.     ferrous fumarate 325 (106 FE) MG Tabs  Commonly known as:  HEMOCYTE - 106 mg FE  Take 1 tablet by mouth  daily.     Fluticasone-Salmeterol 100-50 MCG/DOSE Aepb  Commonly known as:  ADVAIR DISKUS  Inhale 1 puff into the lungs 2 (two) times daily.     furosemide 20 MG tablet  Commonly known as:  LASIX  Take 20 mg by mouth every morning.     HUMIRA PEN 40 MG/0.8ML injection  Generic drug:  adalimumab  Inject 40 mg into the skin once. Every other week  Last injection on 11/24/12     losartan 50 MG tablet  Commonly known as:  COZAAR  Take 50 mg by mouth every morning.     MIRALAX powder  Generic drug:  polyethylene glycol powder  Take 17 g by mouth daily as needed. Mix 1 capful in 8 ounces of water daily     montelukast 10 MG tablet  Commonly known as:  SINGULAIR  10 mg. Take 1 tablet by mouth once daily     MULTIVITAMIN PO  Take 1 tablet by mouth daily.     oxyCODONE 5 MG/5ML solution  Commonly known as:  ROXICODONE  Take 5 mg by mouth every 4 (four) hours as needed. For pain     PROAIR HFA 108 (90 BASE) MCG/ACT inhaler  Generic drug:  albuterol  Inhale 2 puffs into the lungs every 4 (four) hours as needed. WHEEZING     progesterone 200 MG capsule  Commonly known as:  PROMETRIUM  Take 200 mg by mouth daily. Start on 20th of every month and use for 10 or 12 days     rosuvastatin 10 MG tablet  Commonly known as:  CRESTOR  Take 5 mg by mouth every morning.     verapamil 240 MG (CO) 24 hr tablet  Commonly known as:  COVERA HS  Take 240 mg by mouth every morning. Take 1 tablet by mouth once daily     VITAMIN B12 PO  Take 2 capsules by mouth daily.           Follow-up Information   Follow up with Jesstin Studstill C, MD In 10 days.   Contact information:   [address] [place] [address] [place] [address] [phone]       Signed: Javier Docker 12/02/2012, 9:14 AM  Physician Discharge Summary   Patient ID: Dominique Evans MRN: [id number] DOB/AGE: November 29, 1965 47 y.o.  Admit date: 11/30/2012 Discharge date: 12/02/2012  Primary Diagnosis:   H&P STENOSIS  L4-5  Admission Diagnoses:  Past Medical History  Diagnosis Date  . Unspecified essential hypertension   . Hyperlipidemia   . GERD (gastroesophageal reflux disease)   . 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 Chronic constipation   . Obesity   . Asthma     PFT 08/12/07: FEV1 2.97 (104%), FEV1% 84, TLC 4.89 (90%), DLCO 84%, +BD  . Allergic rhinitis   . Panic attacks   . Hydatidiform mole   . Diabetes mellitus   . Visual disturbance   . PONV (postoperative nausea and vomiting)   . Anemia   . Blood transfusion     hx of 2002   . 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 Depression   . Psoriasis    Discharge Diagnoses:   Principal Problem:   HNP (herniated nucleus pulposus), lumbar  Procedure:  Procedure(s) (LRB): MICRO LUMBAR DECOMPRESSION L4-5 ON THE LEFT (Left)   Consults: None  HPI:  See notes    Laboratory Data: Hospital Outpatient Visit on 11/25/2012  Component Date Value Range Status  . MRSA, PCR 11/25/2012 NEGATIVE  NEGATIVE Final  . Staphylococcus aureus 11/25/2012 NEGATIVE  NEGATIVE Final   Comment:                                 The Xpert SA Assay (FDA                          approved for NASAL specimens                          in patients over 50 years of age),                          is one component of                          a comprehensive surveillance                          program.  Test performance has                          been validated by [employer] for patients greater                          than or equal to 92 year old.                          It is not intended                          to diagnose infection nor to                          guide or monitor treatment.  . Sodium 11/25/2012 140  135 - 145 mEq/L  Final  . Potassium 11/25/2012 3.7  3.5 - 5.1 mEq/L Final  . Chloride 11/25/2012 101  96 - 112 mEq/L Final  . CO2 11/25/2012 30   19 - 32 mEq/L Final  . Glucose, Bld 11/25/2012 101* 70 - 99 mg/dL Final  . BUN 16/07/9603 15  6 - 23 mg/dL Final  . Creatinine, Ser 11/25/2012 0.98  0.50 - 1.10 mg/dL Final  . Calcium 54/06/8118 9.4  8.4 - 10.5 mg/dL Final  . GFR calc non Af Amer 11/25/2012 67* >90 mL/min Final  . GFR calc Af Amer 11/25/2012 78* >90 mL/min Final   Comment:                                 The eGFR has been calculated                          using the CKD EPI equation.                          This calculation has not been                          validated in all clinical                          situations.                          eGFR's persistently                          <90 mL/min signify                          possible Chronic Kidney Disease.  . WBC 11/25/2012 6.5  4.0 - 10.5 K/uL Final  . RBC 11/25/2012 4.46  3.87 - 5.11 MIL/uL Final  . Hemoglobin 11/25/2012 12.6  12.0 - 15.0 g/dL Final  . HCT 14/78/2956 38.1  36.0 - 46.0 % Final  . MCV 11/25/2012 85.4  78.0 - 100.0 fL Final  . MCH 11/25/2012 28.3  26.0 - 34.0 pg Final  . MCHC 11/25/2012 33.1  30.0 - 36.0 g/dL Final  . RDW 21/30/8657 12.4  11.5 - 15.5 % Final  . Platelets 11/25/2012 221  150 - 400 K/uL Final  . Preg, Serum 11/25/2012 NEGATIVE  NEGATIVE Final   Comment:                                 THE SENSITIVITY OF THIS                          METHODOLOGY IS >10 mIU/mL.   No results found for this basename: HGB,  in the last 72 hours No results found for this basename: WBC, RBC, HCT, PLT,  in the last 72 hours  Recent Labs  12/01/12 0430  NA 135  K 4.3  CL 99  CO2 27  BUN 11  CREATININE 0.77  GLUCOSE 149*  CALCIUM 8.9   No results found for this basename: LABPT, INR,  in the last 72 hours  X-Rays:Dg Chest 2 View  11/25/2012  *RADIOLOGY REPORT*  Clinical Data: Hypertension.  CHEST - 2 VIEW  Comparison: August 13, 2011  Findings: Cardiomediastinal silhouette appears normal.  No acute pulmonary disease is noted.  Bony  thorax is intact.  IMPRESSION: No acute cardiopulmonary abnormality seen.   Original Report Authenticated By: Lupita Raider.,  M.D.    Dg Lumbar Spine 2-3 Views  11/25/2012  *RADIOLOGY REPORT*  Clinical Data: 47 year old female preoperative study for spine surgery.  LUMBAR SPINE - 2-3 VIEW  Comparison: Abdominal series 12/27/2010 andearlier.  Findings: Postoperative changes to the left 12th rib appear related to previous nephrectomy. Staple line at the gastroesophageal junction.  Gastric band device no longer in place.  Extensive left retroperitoneal and abdominal surgical clips.  Lumbar segmentation is normal.  Stable vertebral height and alignment and 2011. Stippled hyperdense material retained in the right colon.  IMPRESSION: Normal lumbar segmentation.  No acute findings in the lumbar spine. Previous partial resection of the left 12th rib. The lumbar levels on these images were numbered in anticipation of surgery.   Original Report Authenticated By: Erskine Speed, M.D.    Dg Spine Portable 1 View  11/30/2012  *RADIOLOGY REPORT*  Clinical Data: Intraoperative image.  PORTABLE SPINE - 1 VIEW  Comparison: 11/30/2012.  Findings: A single lateral intraoperative view of the lumbar spine again demonstrates a soft tissue retractor in place posterior to the L4-L5 interspace.  There has been interval advancement of the surgical probe into the L4-L5 interspace.  The appearance of the lumbar spine is otherwise unchanged.  IMPRESSION: 1.  Intraoperative localization, as above.   Original Report Authenticated By: Trudie Reed, M.D.    Dg Spine Portable 1 View  11/30/2012  *RADIOLOGY REPORT*  Clinical Data: Intraoperative film.  PORTABLE SPINE - 1 VIEW  Comparison: 11/30/2012.  Findings: A single intraoperative lateral view of the lumbar spine now demonstrates a soft tissue retractor and a surgical probe in place posterior to the L4-L5 interspace.  Incidental imaging of the lower thoracic spine is remarkable for  compression fractures at T11 and T12 with approximately 10% loss of height anteriorly at T11 and approximately 25% loss of height anteriorly at T12.  IMPRESSION: 1.  Intraoperative localization, as above.   Original Report Authenticated By: Trudie Reed, M.D.    Dg Spine Portable 1 View  11/30/2012  *RADIOLOGY REPORT*  Clinical Data: Intraoperative film.  PORTABLE SPINE - 1 VIEW  Comparison: 11/25/2012.  Findings: Single lateral intraoperative view of the lumbar spine demonstrates surgical probes in place posterior to the L3-L4 and L4- L5 interspaces.  IMPRESSION: 1.  Intraoperative localization, as above.   Original Report Authenticated By: Trudie Reed, M.D.     EKG: Orders placed during the hospital encounter of 11/25/12  . EKG 12-LEAD  . EKG 12-LEAD     Hospital Course: Patient was admitted to [facility] and taken to the OR and underwent the above state procedure without complications.  Patient tolerated the procedure well and was later transferred to the recovery room and then to the orthopaedic floor for postoperative care.  They were given PO and IV analgesics for pain control following their surgery.  They were given 24 hours of postoperative antibiotics.   PT was consulted postop to assist with mobility and transfers.  The patient was allowed to be WBAT with therapy and was taught back precautions. Discharge planning was consulted to help with postop disposition and equipment needs.  Patient had a fair night on the evening of surgery  and started to get up OOB with therapy on day one. Patient was seen in rounds and was ready to go home on day one.  They were given discharge instructions and dressing directions.  They were instructed on when to follow up in the office with Dr. Shelle Iron.  Discharge Medications: Prior to Admission medications   Medication Sig Start Date End Date Taking? Authorizing Provider  amitriptyline (ELAVIL) 25 MG tablet Take 50 mg by mouth at bedtime. Take 1  tablet by mouth once daily   Yes Historical Provider, MD  CALCIUM PO Take 600 mg by mouth daily.    Yes Historical Provider, MD  Clobetasol Propionate (TEMOVATE) 0.05 % external spray Apply 1 application topically daily as needed. psoriasis   Yes Historical Provider, MD  Cyanocobalamin (VITAMIN B12 PO) Take 2 capsules by mouth daily.    Yes Historical Provider, MD  desvenlafaxine (PRISTIQ) 50 MG 24 hr tablet Take 50 mg by mouth every morning.    Yes Historical Provider, MD  dexlansoprazole (DEXILANT) 60 MG capsule Take 60 mg by mouth daily.    Yes Historical Provider, MD  ferrous fumarate (HEMOCYTE - 106 MG FE) 325 (106 FE) MG TABS Take 1 tablet by mouth daily.    Yes Historical Provider, MD  Fluticasone-Salmeterol (ADVAIR DISKUS) 100-50 MCG/DOSE AEPB Inhale 1 puff into the lungs 2 (two) times daily. 01/11/12 01/10/13 Yes Coralyn Helling, MD  furosemide (LASIX) 20 MG tablet Take 20 mg by mouth every morning.    Yes Historical Provider, MD  losartan (COZAAR) 50 MG tablet Take 50 mg by mouth every morning.  09/21/11  Yes Historical Provider, MD  montelukast (SINGULAIR) 10 MG tablet 10 mg. Take 1 tablet by mouth once daily   Yes Historical Provider, MD  Multiple Vitamins-Minerals (MULTIVITAMIN PO) Take 1 tablet by mouth daily.    Yes Historical Provider, MD  polyethylene glycol (MIRALAX) powder Take 17 g by mouth daily as needed. Mix 1 capful in 8 ounces of water daily   Yes Historical Provider, MD  PROAIR HFA 108 (90 BASE) MCG/ACT inhaler Inhale 2 puffs into the lungs every 4 (four) hours as needed. WHEEZING 08/26/11  Yes Historical Provider, MD  rosuvastatin (CRESTOR) 10 MG tablet Take 5 mg by mouth every morning.    Yes Historical Provider, MD  verapamil (COVERA HS) 240 MG (CO) 24 hr tablet Take 240 mg by mouth every morning. Take 1 tablet by mouth once daily   Yes Historical Provider, MD  adalimumab (HUMIRA PEN) 40 MG/0.8ML injection Inject 40 mg into the skin once. Every other week  Last injection on  11/24/12    Historical Provider, MD  methocarbamol (ROBAXIN) 500 MG tablet Take 1 tablet (500 mg total) by mouth 3 (three) times daily. 11/30/12   Javier Docker, MD  oxyCODONE (ROXICODONE) 5 MG/5ML solution Take 5 mg by mouth every 4 (four) hours as needed. For pain    Historical Provider, MD  oxyCODONE-acetaminophen (PERCOCET) 7.5-325 MG per tablet Take 1-2 tablets by mouth every 4 (four) hours as needed for pain. 11/30/12   Javier Docker, MD  progesterone (PROMETRIUM) 200 MG capsule Take 200 mg by mouth daily. Start on 20th of every month and use for 10 or 12 days    Historical Provider, MD    Diet: Regular diet Activity:WBAT Follow-up:in 10 days Disposition - Home Discharged Condition: good      Medication List    TAKE these medications       methocarbamol 500 MG tablet  Commonly known as:  ROBAXIN  Take 1 tablet (500 mg total) by mouth 3 (three) times daily.     oxyCODONE-acetaminophen 7.5-325 MG per tablet  Commonly known as:  PERCOCET  Take 1-2 tablets by mouth every 4 (four) hours as needed for pain.      ASK your doctor about these medications       amitriptyline 25 MG tablet  Commonly known as:  ELAVIL  Take 50 mg by mouth at bedtime. Take 1 tablet by mouth once daily     CALCIUM PO  Take 600 mg by mouth daily.     Clobetasol Propionate 0.05 % external spray  Commonly known as:  TEMOVATE  Apply 1 application topically daily as needed. psoriasis     desvenlafaxine 50 MG 24 hr tablet  Commonly known as:  PRISTIQ  Take 50 mg by mouth every morning.     DEXILANT 60 MG capsule  Generic drug:  dexlansoprazole  Take 60 mg by mouth daily.     ferrous fumarate 325 (106 FE) MG Tabs  Commonly known as:  HEMOCYTE - 106 mg FE  Take 1 tablet by mouth daily.     Fluticasone-Salmeterol 100-50 MCG/DOSE Aepb  Commonly known as:  ADVAIR DISKUS  Inhale 1 puff into the lungs 2 (two) times daily.     furosemide 20 MG tablet  Commonly known as:  LASIX  Take 20 mg by  mouth every morning.     HUMIRA PEN 40 MG/0.8ML injection  Generic drug:  adalimumab  Inject 40 mg into the skin once. Every other week  Last injection on 11/24/12     losartan 50 MG tablet  Commonly known as:  COZAAR  Take 50 mg by mouth every morning.     MIRALAX powder  Generic drug:  polyethylene glycol powder  Take 17 g by mouth daily as needed. Mix 1 capful in 8 ounces of water daily     montelukast 10 MG tablet  Commonly known as:  SINGULAIR  10 mg. Take 1 tablet by mouth once daily     MULTIVITAMIN PO  Take 1 tablet by mouth daily.     oxyCODONE 5 MG/5ML solution  Commonly known as:  ROXICODONE  Take 5 mg by mouth every 4 (four) hours as needed. For pain     PROAIR HFA 108 (90 BASE) MCG/ACT inhaler  Generic drug:  albuterol  Inhale 2 puffs into the lungs every 4 (four) hours as needed. WHEEZING     progesterone 200 MG capsule  Commonly known as:  PROMETRIUM  Take 200 mg by mouth daily. Start on 20th of every month and use for 10 or 12 days     rosuvastatin 10 MG tablet  Commonly known as:  CRESTOR  Take 5 mg by mouth every morning.     verapamil 240 MG (CO) 24 hr tablet  Commonly known as:  COVERA HS  Take 240 mg by mouth every morning. Take 1 tablet by mouth once daily     VITAMIN B12 PO  Take 2 capsules by mouth daily.           Follow-up Information   Follow up with Jaylina Ramdass C, MD In 10 days.   Contact information:   [address] [place] [address] [phone]       Signed: Javier Docker 12/02/2012, 9:14 AM

## 2012-12-02 NOTE — Care Management Note (Signed)
    Page 1 of 1   12/02/2012     11:21:17 AM   CARE MANAGEMENT NOTE 12/02/2012  Patient:  Dominique Evans, Dominique Evans   Account Number:  [id number]  Date Initiated:  12/02/2012  Documentation initiated by:  Colleen Can  Subjective/Objective Assessment:   DX Spinal stenosis, herniated nucleus pulposus l4-5; microlumbar decompression L4-5-left, foraminotomies microdiskectomy-L4-5.left     Action/Plan:   CM spoke with patient. Plans are for her to return to her home where her Mother will be caregiver. She will need RW. No HH services needed.   Anticipated DC Date:  12/02/2012   Anticipated DC Plan:  HOME/SELF CARE      DC Planning Services  CM consult      PAC Choice  DURABLE MEDICAL EQUIPMENT   Choice offered to / List presented to:  NA   DME arranged  Levan Hurst      DME agency  [facility]        Status of service:  Completed, signed off Medicare Important Message given?   (If response is "NO", the following Medicare IM given date fields will be blank) Date Medicare IM given:   Date Additional Medicare IM given:    Discharge Disposition:  HOME/SELF CARE  Per UR Regulation:    If discussed at Long Length of Stay Meetings, dates discussed:    Comments:

## 2012-12-14 ENCOUNTER — Encounter: Payer: Self-pay | Admitting: *Deleted

## 2012-12-19 NOTE — Progress Notes (Signed)
Repeat Tanita scan -   Surgery date: 10/05/11  Surgery type: Gastric Bypass  Start wt @ [facility]: 276.4 lbs Wt @ Surgery: 279.5 lbs  Weight today: 243.5 lbs Weight change: 2.5 lbs GAIN Total weight lost: 32.9 lbs (since assessment) BMI: 38.7 kg/m^2  Goal wt: <199 lbs % Goal met: 41%  TANITA  BODY COMP RESULTS  01/23/12 04/30/12 09/24/12 11/26/12 12/14/12  FM (lbs) 111.5 105.0 112.0 112.0 118.5  FFM (lbs) 123.0 124.5 130.5 129.0 125.0  TBW (lbs) 90.0 91.0 95.5 94.5 91.5

## 2012-12-29 ENCOUNTER — Encounter: Payer: Managed Care, Other (non HMO) | Attending: General Surgery | Admitting: *Deleted

## 2012-12-29 ENCOUNTER — Encounter: Payer: Self-pay | Admitting: *Deleted

## 2012-12-29 VITALS — Ht 66.5 in | Wt 246.5 lb

## 2012-12-29 DIAGNOSIS — E669 Obesity, unspecified: Secondary | ICD-10-CM

## 2012-12-29 DIAGNOSIS — Z9884 Bariatric surgery status: Secondary | ICD-10-CM | POA: Insufficient documentation

## 2012-12-29 DIAGNOSIS — Z09 Encounter for follow-up examination after completed treatment for conditions other than malignant neoplasm: Secondary | ICD-10-CM | POA: Insufficient documentation

## 2012-12-29 DIAGNOSIS — Z713 Dietary counseling and surveillance: Secondary | ICD-10-CM | POA: Insufficient documentation

## 2012-12-29 NOTE — Patient Instructions (Addendum)
Goals:  Continue previous goals  Add 15 g of carbs to each meal/snack to help low blood sugars  Contact Dr. Wylene Simmer regarding low blood sugars if they continue  Take 1000-1200 mg calcium in (3) 500 mg doses if approved by urologist.  Exercise as able after surgery per MD ok.   TRACK INTAKE IN [employer]itness [employer] - Make me a "friend" with rights to view your diary.

## 2012-12-29 NOTE — Progress Notes (Signed)
Follow-up visit:  15 months Post-Operative Gastric Bypass (LAGB Revision) Surgery  Medical Nutrition Therapy:  Appt start time: 1115   End time: 1145.  Primary concerns today:  Post-operative bariatric surgery nutrition management.  Dominique Evans returns for f/u 4 weeks s/p back surgery. Wt gain of 3 lbs since last visit, though lost 4 lbs of FAT MASS and has 5 lbs of fluid retention. Reports stopped drinking diet mtn dew on 11/29/12. Reports blood glucose is regulating and recent 2hr PP of 102 mg.   Surgery date: 10/05/11  Surgery type: Gastric Bypass  Start wt @ [facility]: 276.4 lbs  Weight today: 246.5 lbs Weight change: 3.0 lbs GAIN Total weight lost: 29.9 lbs  BMI: 39.2 kg/m^2  Goal wt: <199 lbs % Goal met: 39%  Lab Results  Component Value Date   HGBA1C 5.9* 12/31/2011    TANITA  BODY COMP RESULTS  01/23/12 04/30/12 09/24/12 11/26/12 12/14/12 12/29/12  FM (lbs) 111.5 105.0 112.0 112.0 118.5 114.5  FFM (lbs) 123.0 124.5 130.5 129.0 125.0 132.0  TBW (lbs) 90.0 91.0 95.5 94.5 91.5 96.5   24-hr recall: B (AM): Bacon bowl w/ 2 eggs and cheese (25g) OR Atkins shake Snk (AM): [employer] (vanilla almond) - (20g) L (PM):  Chili (homemade) - 1 cup Snk (PM): NONE  D (6:30-7:30 PM): Chili Snk (PM): Brownie  Fluid intake:  Water w/ Crystal Light, Flavored water =  80 oz Estimated total protein intake:  60-70g/day  Medications: No changes Supplementation: Taking MVI and B12 regularly. Only 1 dose of calcium d/t fear of additional calcium oxalate crystallization. Pt to f/u with urologist regarding dose approval.  Using straws: No Drinking while eating: Sometimes; baby sips with dry food Hair loss: No Carbonated beverages: Yes, but only when food is stuck N/V/D/C: None Dumping syndrome: None reported   Recent physical activity:  PT right now. Joined [employer] yesterday.   Progress Towards Goal(s):  In progress.   Nutritional Diagnosis:  [place]-3.3 Overweight/obesity related to recent Gastric  Bypass surgery as evidenced by patient following post-op nutrition guidelines for continued weight loss.    Intervention:  Nutrition education/reinforcement.  Monitoring/Evaluation:  Dietary intake, exercise, and body weight. Follow up in 4 weeks for 16 mo f/u.

## 2013-01-05 ENCOUNTER — Ambulatory Visit (INDEPENDENT_AMBULATORY_CARE_PROVIDER_SITE_OTHER): Payer: Managed Care, Other (non HMO) | Admitting: General Surgery

## 2013-01-05 ENCOUNTER — Encounter (INDEPENDENT_AMBULATORY_CARE_PROVIDER_SITE_OTHER): Payer: Self-pay | Admitting: General Surgery

## 2013-01-05 VITALS — BP 132/84 | HR 83 | Temp 99.0°F | Resp 18 | Ht 66.5 in | Wt 244.2 lb

## 2013-01-05 DIAGNOSIS — Z09 Encounter for follow-up examination after completed treatment for conditions other than malignant neoplasm: Secondary | ICD-10-CM

## 2013-01-05 DIAGNOSIS — K912 Postsurgical malabsorption, not elsewhere classified: Secondary | ICD-10-CM

## 2013-01-05 DIAGNOSIS — Z9884 Bariatric surgery status: Secondary | ICD-10-CM | POA: Insufficient documentation

## 2013-01-05 DIAGNOSIS — E669 Obesity, unspecified: Secondary | ICD-10-CM

## 2013-01-05 HISTORY — DX: Bariatric surgery status: Z98.84

## 2013-01-05 NOTE — Progress Notes (Signed)
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 I saw her at her three-month followup but have not seen her since. This is due to a couple of back injury she sustained a subsequently had laminectomy and fusion. She is now doing better and becoming more active. She had not been able to exercise for the majority of the year which she feels limited her weight loss. She has no GI symptoms. She still experiences appropriate restriction although this is he is a little bit. No double pain or vomiting or difficulty with her bowels or other complaints.  Past Medical History  Diagnosis Date  . Unspecified essential hypertension   . Hyperlipidemia   . GERD (gastroesophageal reflux disease)   . 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 Chronic constipation   . Obesity   . Asthma     PFT 08/12/07: FEV1 2.97 (104%), FEV1% 84, TLC 4.89 (90%), DLCO 84%, +BD  . Allergic rhinitis   . Panic attacks   . Hydatidiform mole   . Diabetes mellitus   . Visual disturbance   . PONV (postoperative nausea and vomiting)   . Anemia   . Blood transfusion     hx of 2002   . 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 Depression   . Psoriasis    Past Surgical History  Procedure Laterality Date  . Nephrectomy  2002    left  . Elbow fracture surgery      left  . Shoulder arthroscopy      right shoulder  . Laparoscopic gastric banding  02/2006    w/ truncal vagotomy  . Patent ductus arterious repair    . Lap band removal     . 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 Lumbar laminectomy/decompression microdiscectomy Left 11/30/2012    Procedure: MICRO LUMBAR DECOMPRESSION L4-5 ON THE LEFT;  Surgeon: Javier Docker, MD;  Location: [facility];  Service: Orthopedics;  Laterality: Left;  MICRO LUMBAR DECOMPRESSION L4-5 ON THE LEFT   Current Outpatient Prescriptions  Medication Sig Dispense Refill  . adalimumab (HUMIRA PEN) 40 MG/0.8ML injection Inject 40 mg into the skin once. Every other week  Last injection on 11/24/12      . amitriptyline (ELAVIL) 25 MG tablet Take 50 mg by mouth at bedtime. Take 1 tablet by mouth once daily      . CALCIUM PO Take 600 mg by mouth daily.       . Clobetasol Propionate (TEMOVATE) 0.05 % external spray Apply 1 application topically daily as needed. psoriasis      . Cyanocobalamin (VITAMIN B12 PO) Take 2 capsules by mouth daily.       Marland Kitchen desvenlafaxine (PRISTIQ) 50 MG 24 hr tablet Take 50 mg by mouth every morning.       Marland Kitchen dexlansoprazole (DEXILANT) 60 MG capsule Take 60 mg by mouth daily.       . ferrous fumarate (HEMOCYTE - 106 MG FE) 325 (106 FE) MG TABS Take 1 tablet by mouth daily.       . Fluticasone-Salmeterol (ADVAIR DISKUS) 100-50 MCG/DOSE AEPB Inhale 1 puff into the lungs 2 (two) times daily.  180 each  3  . furosemide (LASIX) 20 MG tablet Take 20 mg by mouth every morning.       Marland Kitchen losartan (COZAAR) 50 MG tablet Take 50 mg by mouth every morning.       . montelukast (SINGULAIR) 10 MG tablet 10 mg. Take 1 tablet by mouth once daily      . Multiple Vitamins-Minerals (MULTIVITAMIN PO) Take 1 tablet by mouth daily.       Marland Kitchen oxyCODONE-acetaminophen (PERCOCET) 7.5-325 MG per tablet Take 1-2 tablets by mouth every 4 (four) hours as needed for pain.  60 tablet  0  . polyethylene glycol (MIRALAX) powder Take 17 g by mouth daily as needed. Mix 1 capful in 8 ounces of water daily      . PROAIR HFA 108 (90 BASE) MCG/ACT inhaler Inhale 2 puffs into the lungs every 4 (four) hours as needed. WHEEZING      . progesterone (PROMETRIUM) 200 MG capsule Take 200 mg by mouth daily. Start on 20th of every month and use for 10 or 12 days      . rosuvastatin (CRESTOR) 10 MG tablet Take 5 mg  by mouth every morning.       . verapamil (COVERA HS) 240 MG (CO) 24 hr tablet Take 240 mg by mouth every morning. Take 1 tablet by mouth once daily       No current facility-administered medications for this visit.   Allergies  Allergen Reactions  . Penicillins Anaphylaxis  . Sulfonamide Derivatives Anaphylaxis  . Ciprofloxacin Hives   Exam: BP 132/84  Pulse 83  Temp(Src) 99 F (37.2 C) (Temporal)  Resp 18  Ht 5' 6.5" (1.689 m)  Wt 244 lb 3.2 oz (110.768 kg)  BMI 38.83 kg/m2 Total weight loss 30 pounds from the time of her conversion which has been stable for 9 months. General: Appears well Skin: Rash infection Lungs: Clear equal breath sounds Cardiac: Regular rate and rhythm. No edema Abdomen: Soft and nontender. Incisions are well-healed. No hernias. Extremities: No edema  Assessment plan: Doing well following gastric bypass with no evidence of complication. She has remission of her diabetes and improvement in GERD. She has had somewhat modest weight loss but still had weight loss from her band at the time of conversion.  She has not been able to exercise but is now getting back in the gym and I think could have some continued success if she is able to maintain her exercise program. This is discussed with her. We will check lab work including vitamin levels and call with results. Return in one year or sooner if needed.

## 2013-01-06 LAB — COMPREHENSIVE METABOLIC PANEL
ALT: 15 U/L (ref 0–35)
AST: 15 U/L (ref 0–37)
Albumin: 4.2 g/dL (ref 3.5–5.2)
Alkaline Phosphatase: 73 U/L (ref 39–117)
BUN: 16 mg/dL (ref 6–23)
CO2: 28 mEq/L (ref 19–32)
Chloride: 103 mEq/L (ref 96–112)
Glucose, Bld: 109 mg/dL — ABNORMAL HIGH (ref 70–99)
Potassium: 4 mEq/L (ref 3.5–5.3)
Sodium: 138 mEq/L (ref 135–145)
Total Bilirubin: 0.4 mg/dL (ref 0.3–1.2)
Total Protein: 7.1 g/dL (ref 6.0–8.3)

## 2013-01-06 LAB — IRON AND TIBC
Iron: 127 ug/dL (ref 42–145)
UIBC: 245 ug/dL (ref 125–400)

## 2013-01-06 LAB — CBC WITH DIFFERENTIAL/PLATELET
Basophils Relative: 0 % (ref 0–1)
Eosinophils Absolute: 0.1 10*3/uL (ref 0.0–0.7)
HCT: 36 % (ref 36.0–46.0)
Hemoglobin: 12.4 g/dL (ref 12.0–15.0)
Lymphs Abs: 2.6 10*3/uL (ref 0.7–4.0)
MCH: 28.3 pg (ref 26.0–34.0)
MCHC: 34.4 g/dL (ref 30.0–36.0)
MCV: 82.2 fL (ref 78.0–100.0)
Monocytes Absolute: 0.4 10*3/uL (ref 0.1–1.0)
Monocytes Relative: 6 % (ref 3–12)
Neutrophils Relative %: 54 % (ref 43–77)
RBC: 4.38 MIL/uL (ref 3.87–5.11)
RDW: 14.3 % (ref 11.5–15.5)

## 2013-01-06 LAB — MAGNESIUM: Magnesium: 2 mg/dL (ref 1.5–2.5)

## 2013-01-06 LAB — LIPID PANEL
Cholesterol: 119 mg/dL (ref 0–200)
HDL: 36 mg/dL — ABNORMAL LOW (ref >39)
LDL Cholesterol: 60 mg/dL (ref 0–99)
Triglycerides: 113 mg/dL (ref <150)

## 2013-01-06 LAB — HEMOGLOBIN A1C: Mean Plasma Glucose: 126 mg/dL — ABNORMAL HIGH (ref <117)

## 2013-01-07 LAB — FOLATE: Folate: >20 ng/mL

## 2013-01-07 LAB — VITAMIN B12: Vitamin B-12: 1045 pg/mL — ABNORMAL HIGH (ref 211–911)

## 2013-02-18 ENCOUNTER — Encounter: Payer: Managed Care, Other (non HMO) | Attending: General Surgery | Admitting: *Deleted

## 2013-02-18 ENCOUNTER — Encounter: Payer: Self-pay | Admitting: *Deleted

## 2013-02-18 VITALS — Ht 66.5 in | Wt 241.0 lb

## 2013-02-18 DIAGNOSIS — Z9884 Bariatric surgery status: Secondary | ICD-10-CM | POA: Insufficient documentation

## 2013-02-18 DIAGNOSIS — Z713 Dietary counseling and surveillance: Secondary | ICD-10-CM | POA: Insufficient documentation

## 2013-02-18 DIAGNOSIS — Z09 Encounter for follow-up examination after completed treatment for conditions other than malignant neoplasm: Secondary | ICD-10-CM | POA: Insufficient documentation

## 2013-02-18 DIAGNOSIS — E669 Obesity, unspecified: Secondary | ICD-10-CM

## 2013-02-18 NOTE — Progress Notes (Signed)
Follow-up visit:  16 months Post-Operative Gastric Bypass (LAGB Revision) Surgery  Medical Nutrition Therapy:  Appt start time: 1245   End time: 1315.  Primary concerns today:  Post-operative bariatric surgery nutrition management.  Dominique Evans returns for f/u.  Reports she fell 01/14/13 and re-ruptured the disc that was operated on 11/29/12.  Only checks BGs when feeling bad.  Reports recent 2hr PPs of 45-65 mg when has sweats, dizziness. Advised to contact PCP following her T2DM asap.   Surgery date: 10/05/11  Surgery type: Gastric Bypass  Start wt @ [facility]: 276.4 lbs  Weight today: 241.0 lbs Weight change: 5.5 lbs  Total weight lost: 35.4 lbs  BMI: 38.3 kg/m^2  Goal wt: <199 lbs % Goal met: 41%  TANITA  BODY COMP RESULTS  01/23/12 04/30/12 09/24/12 11/26/12 12/14/12 12/29/12 02/18/13  FM (lbs) 111.5 105.0 112.0 112.0 118.5 114.5 112.5  FFM (lbs) 123.0 124.5 130.5 129.0 125.0 132.0 128.5  TBW (lbs) 90.0 91.0 95.5 94.5 91.5 96.5 94.0   Lab Results  Component Value Date   HGBA1C 6.0* 01/06/2013   24-hr recall: B (AM): [place] bacon bowl w/ 2 eggs and cheese (25g) Snk (AM): P3 w/ almonds (15g) L (PM):  Atkins crustless chicken pot pie OR Atkins meal bar (20-25g) Snk (PM):  Atkins shake(15g) D (6:30-7:30 PM): Cheeseburger OR Atkins Crustless CPP (20-25g) Snk (PM): NONE  Fluid intake:  Water w/ Crystal Light, Flavored water =  100 oz Estimated total protein intake:  90-100g  Medications:  Changed BCP to Lo Loestrin Supplementation: Taking MVI and B12 regularly. Only 1 dose of calcium d/t fear of additional calcium oxalate crystallization. Pt to f/u with urologist regarding dose approval.  Using straws: No Drinking while eating: Sometimes; baby sips with dry food Hair loss: No Carbonated beverages: Yes, but only when food is stuck N/V/D/C: None; Takes Miralax daily to prevent constipation Dumping syndrome: None reported   Recent physical activity:  Only walking (normal ADLs) with walker d/t  re-injured disc.   Progress Towards Goal(s):  In progress.   Nutritional Diagnosis:  [place]-3.3 Overweight/obesity related to recent Gastric Bypass surgery as evidenced by patient following post-op nutrition guidelines for continued weight loss.    Intervention:  Nutrition education/reinforcement.  Monitoring/Evaluation:  Dietary intake, exercise, and body weight. Follow up in 8 weeks for 18 mo f/u.

## 2013-02-18 NOTE — Patient Instructions (Addendum)
Goals:  Continue previous goals  Add 15 g of carbs to each meal/snack to help low blood sugars  Contact Dr. Wylene Simmer regarding low blood sugars if they continue  Take 1000-1200 mg calcium in (3) 500 mg doses if approved by urologist.  Exercise ~30-45 min daily with MD ok.

## 2013-03-14 ENCOUNTER — Other Ambulatory Visit: Payer: Self-pay | Admitting: Orthopedic Surgery

## 2013-03-21 ENCOUNTER — Encounter (HOSPITAL_COMMUNITY): Payer: Self-pay | Admitting: Pharmacy Technician

## 2013-03-22 NOTE — Progress Notes (Signed)
Chest x ray, EKG 2/14 EPIC

## 2013-03-22 NOTE — Patient Instructions (Addendum)
20 Dominique Evans  03/22/2013   Your procedure is scheduled on:  03/30/13 THURSDAY  Report to [facility] at   0800    AM.  Call this number if you have problems the morning of surgery: [phone]       Remember: Bring Inhalers with you to hospital  Do not eat food  Or drink :After Midnight. Wednesday NIGHT   Take these medicines the morning of surgery with A SIP OF WATER:Singulair, Zyrtec, Dexilant, Prestiq, Verapamil                     ADVAIR May use pro air  Or take OXYCODONE if needed  .  Contacts, dentures or partial plates can not be worn to surgery  Leave suitcase in the car. After surgery it may be brought to your room.  For patients admitted to the hospital, checkout time is 11:00 AM day of  discharge.             SPECIAL INSTRUCTIONS- SEE [facility] PREPARING FOR SURGERY INSTRUCTION SHEET-     DO NOT WEAR JEWELRY, LOTIONS, POWDERS, OR PERFUMES.  WOMEN-- DO NOT SHAVE LEGS OR UNDERARMS FOR 12 HOURS BEFORE SHOWERS. MEN MAY SHAVE FACE.  Patients discharged the day of surgery will not be allowed to drive home. IF going home the day of surgery, you must have a driver and someone to stay with you for the first 24 hours  Name and phone number of your driver:  admission                                                                      Please read over the following fact sheets that you were given: MRSA Information, Incentive Spirometry Sheet,Information                                                                                   Dominique Evans  PST [phone]  [phone]                 FAILURE TO FOLLOW THESE INSTRUCTIONS MAY RESULT IN  CANCELLATION   OF YOUR SURGERY                                                  Patient Signature _____________________________

## 2013-03-23 ENCOUNTER — Encounter (HOSPITAL_COMMUNITY)
Admission: RE | Admit: 2013-03-23 | Discharge: 2013-03-23 | Disposition: A | Discharge status: Home / Self Care | Payer: Managed Care, Other (non HMO) | Source: Ambulatory Visit | Attending: Family Medicine | Admitting: Family Medicine

## 2013-03-23 ENCOUNTER — Ambulatory Visit (HOSPITAL_COMMUNITY)
Admission: RE | Admit: 2013-03-23 | Discharge: 2013-03-23 | Disposition: A | Discharge status: Home / Self Care | Payer: Managed Care, Other (non HMO) | Source: Ambulatory Visit | Attending: Orthopedic Surgery | Admitting: Orthopedic Surgery

## 2013-03-23 ENCOUNTER — Encounter (HOSPITAL_COMMUNITY): Payer: Self-pay

## 2013-03-23 ENCOUNTER — Ambulatory Visit (HOSPITAL_COMMUNITY)
Admission: RE | Admit: 2013-03-23 | Discharge: 2013-03-23 | Disposition: A | Discharge status: Home / Self Care | Payer: Managed Care, Other (non HMO) | Source: Ambulatory Visit | Attending: Specialist | Admitting: Specialist

## 2013-03-23 DIAGNOSIS — Z01818 Encounter for other preprocedural examination: Secondary | ICD-10-CM | POA: Insufficient documentation

## 2013-03-23 DIAGNOSIS — J45909 Unspecified asthma, uncomplicated: Secondary | ICD-10-CM | POA: Insufficient documentation

## 2013-03-23 DIAGNOSIS — IMO0002 Reserved for concepts with insufficient information to code with codable children: Secondary | ICD-10-CM | POA: Insufficient documentation

## 2013-03-23 DIAGNOSIS — Z01812 Encounter for preprocedural laboratory examination: Secondary | ICD-10-CM | POA: Insufficient documentation

## 2013-03-23 HISTORY — DX: Cough: R05

## 2013-03-23 HISTORY — DX: Polyneuropathy, unspecified: G62.9

## 2013-03-23 LAB — BASIC METABOLIC PANEL
CO2: 29 mEq/L (ref 19–32)
Calcium: 9.3 mg/dL (ref 8.4–10.5)
Creatinine, Ser: 1.07 mg/dL (ref 0.50–1.10)
GFR calc Af Amer: 70 mL/min — ABNORMAL LOW (ref >90)
GFR calc non Af Amer: 60 mL/min — ABNORMAL LOW (ref >90)
Glucose, Bld: 124 mg/dL — ABNORMAL HIGH (ref 70–99)
Potassium: 3.7 mEq/L (ref 3.5–5.1)
Sodium: 140 mEq/L (ref 135–145)

## 2013-03-23 LAB — CBC
Hemoglobin: 12.7 g/dL (ref 12.0–15.0)
MCH: 28.5 pg (ref 26.0–34.0)
MCV: 85.7 fL (ref 78.0–100.0)
RBC: 4.46 MIL/uL (ref 3.87–5.11)
WBC: 8.6 10*3/uL (ref 4.0–10.5)

## 2013-03-23 LAB — SURGICAL PCR SCREEN: MRSA, PCR: NEGATIVE

## 2013-03-23 LAB — HCG, SERUM, QUALITATIVE: Preg, Serum: NEGATIVE

## 2013-03-27 ENCOUNTER — Other Ambulatory Visit: Payer: Self-pay | Admitting: Orthopedic Surgery

## 2013-03-27 NOTE — H&P (Signed)
Dominique Evans is an 46 y.o. female.   Chief Complaint: back and left leg pain HPI: The patient is a 46 year old female who presents today for follow up of their left-sided back pain. They are now 3.5 months out from decompression @ L4-5. Symptoms reported today include: pain (low back), weakness, numbness (left leg) and leg pain (left). Current treatment includes cane. The following medication has been used for pain control: Oxycodone. The patient reports their current pain level to be moderate. She still has complaints of the severe pain into the left leg. Minimal help from the ESI, it was temporary, this was at L5-S1. She no longer has pain that radiates into the outer aspect of the foot down into the dorsum and into the medial aspect. She reports doing great postoperatively. No pain until she fell and fractured her toe. She slipped in the tub. She is having minimal to no back pain.  Past Medical History  Diagnosis Date  . Unspecified essential hypertension   . Hyperlipidemia   . GERD (gastroesophageal reflux disease)   . 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 Chronic constipation   . Obesity   . Asthma     PFT 08/12/07: FEV1 2.97 (104%), FEV1% 84, TLC 4.89 (90%), DLCO 84%, +BD  . Allergic rhinitis   . Panic attacks   . Hydatidiform mole   . Visual disturbance   . PONV (postoperative nausea and vomiting)   . Anemia   . Blood transfusion     hx of 2002   . 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 Depression   . Psoriasis   . Hernia     LLQ (after kidney removed)  . Diabetes mellitus     pre gastric bypass  . Cough 03/23/13  . Peripheral neuropathy     bilateral feet    Past Surgical History  Procedure Laterality Date  . Nephrectomy  2002    left  . Elbow fracture surgery      left  . Shoulder arthroscopy      right shoulder  . Laparoscopic gastric banding  02/2006    w/ truncal vagotomy  . Patent  ductus arterious repair    . Lap band removal     . Gastric roux-en-y  10/05/2011    Procedure: LAPAROSCOPIC ROUX-EN-Y GASTRIC;  Surgeon: Benjamin T Hoxworth, MD;  Location: [facility];  Service: General;  Laterality: N/A;  UPPER ENDOSCOPY  . Lumbar laminectomy/decompression microdiscectomy Left 11/30/2012    Procedure: MICRO LUMBAR DECOMPRESSION L4-5 ON THE LEFT;  Surgeon: Jeffrey C Beane, MD;  Location: [facility];  Service: Orthopedics;  Laterality: Left;  MICRO LUMBAR DECOMPRESSION L4-5 ON THE LEFT    Family History  Problem Relation Age of Onset  . Hypertension Father   . Skin cancer Father   . Cancer Father     melanoma  . Heart disease Father   . Diabetes Mother   . Hypertension Mother   . Skin cancer Mother   . Cancer Mother     Melanoma,Breast  . Heart disease Mother   . Hypertension Sister   . Heart disease Sister   . Breast cancer      aunt  . Cancer Maternal Aunt     breast   Social History:  reports that she quit smoking about 30 years ago. Her smoking use included Cigarettes. She smoked 2.00 packs per   day. She has never used smokeless tobacco. She reports that she does not drink alcohol or use illicit drugs.  Allergies:  Allergies  Allergen Reactions  . Penicillins Anaphylaxis  . Sulfonamide Derivatives Anaphylaxis  . Ciprofloxacin Hives     (Not in a hospital admission)  No results found for this or any previous visit (from the past 48 hour(s)). No results found.  Review of Systems  Constitutional: Negative.   HENT: Negative.   Eyes: Negative.   Respiratory: Negative.   Cardiovascular: Negative.   Gastrointestinal: Negative.   Genitourinary: Negative.   Musculoskeletal: Positive for back pain.  Skin: Negative.   Neurological: Positive for sensory change and focal weakness.  Endo/Heme/Allergies: Negative.   Psychiatric/Behavioral: Negative.     Last menstrual period 01/27/2013. Physical Exam  Constitutional: She is oriented to person, place, and time.  She appears well-developed and well-nourished. She appears distressed.  HENT:  Head: Normocephalic.  Eyes: Conjunctivae and EOM are normal. Pupils are equal, round, and reactive to light.  Neck: Normal range of motion. Neck supple.  Cardiovascular: Normal rate and regular rhythm.   Respiratory: Effort normal.  GI: Soft. Bowel sounds are normal.  Musculoskeletal:  On examination, severe distress. Straight leg raise produces buttock, thigh and calf pain. EHL is five minus over five tibialis anterior. Altered sensation in a global distribution, unchanged.   Neurological: She is alert and oriented to person, place, and time. She has normal reflexes.  Skin: Skin is warm and dry.  Psychiatric: She has a normal mood and affect.     X-rays show mild scoliosis. No instability in flexion and extension. MRI recurrent disk herniation L4-5 paracentral to the left into the foramen.  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 No instability in flexion and extension, minimal back pain, underlying scoliosis, multilevel degeneration.  I had an extensive discussion with the patient concerning the pathology, relevant anatomy and treatment options. We discussed living with the symptoms which she does not want to do versus lumbar decompression versus lumbar decompression with a fusion. We talked about the implications of a fusion in terms of the risks with underlying comorbidities as well as adjacent segment, scoliosis, and the issues associated with that. We also discussed re-do decompression. If she can avoid a postoperative injury, perhaps then successful outcome can be obtained. We spent considerable time discussing the issues surrounding her injury and how to prevent that. Hopefully that can be implemented. Certainly if she continues to rerupture, then she may have to have an extended fusion. At this point in time without instability or back pain, I do not feel committing to that would  be in her best interest. She has a fairly significant disk herniation. It is reasonable to proceed with that.  Plan redo microlumbar decompression L4-5  BISSELL, JACLYN M. for Dr. Beane 03/27/2013, 9:12 AM    

## 2013-03-29 MED ORDER — VANCOMYCIN HCL 10 G IV SOLR
1500.0000 mg | INTRAVENOUS | Status: AC
  Administered 2013-03-30 (×2): 1500 mg via INTRAVENOUS
  Filled 2013-03-29: qty 1500

## 2013-03-30 ENCOUNTER — Inpatient Hospital Stay (HOSPITAL_COMMUNITY): Payer: Managed Care, Other (non HMO)

## 2013-03-30 ENCOUNTER — Encounter (HOSPITAL_COMMUNITY): Payer: Self-pay | Admitting: *Deleted

## 2013-03-30 ENCOUNTER — Encounter (HOSPITAL_COMMUNITY)
Admission: RE | Disposition: A | Discharge status: Home Health | Payer: Self-pay | Source: Ambulatory Visit | Attending: Specialist

## 2013-03-30 ENCOUNTER — Inpatient Hospital Stay (HOSPITAL_COMMUNITY): Payer: Managed Care, Other (non HMO) | Admitting: *Deleted

## 2013-03-30 ENCOUNTER — Inpatient Hospital Stay (HOSPITAL_COMMUNITY)
Admission: RE | Admit: 2013-03-30 | Discharge: 2013-04-01 | DRG: 490 | Disposition: A | Discharge status: Home Health | Payer: Managed Care, Other (non HMO) | Source: Ambulatory Visit | Attending: Specialist | Admitting: Specialist

## 2013-03-30 DIAGNOSIS — Y92009 Unspecified place in unspecified non-institutional (private) residence as the place of occurrence of the external cause: Secondary | ICD-10-CM

## 2013-03-30 DIAGNOSIS — Z6838 Body mass index (BMI) 38.0-38.9, adult: Secondary | ICD-10-CM

## 2013-03-30 DIAGNOSIS — M5126 Other intervertebral disc displacement, lumbar region: Principal | ICD-10-CM | POA: Diagnosis present

## 2013-03-30 DIAGNOSIS — M48061 Spinal stenosis, lumbar region without neurogenic claudication: Secondary | ICD-10-CM | POA: Diagnosis present

## 2013-03-30 DIAGNOSIS — S32009A Unspecified fracture of unspecified lumbar vertebra, initial encounter for closed fracture: Secondary | ICD-10-CM | POA: Diagnosis present

## 2013-03-30 DIAGNOSIS — W010XXA Fall on same level from slipping, tripping and stumbling without subsequent striking against object, initial encounter: Secondary | ICD-10-CM | POA: Diagnosis present

## 2013-03-30 HISTORY — PX: LUMBAR LAMINECTOMY/DECOMPRESSION MICRODISCECTOMY: SHX5026

## 2013-03-30 LAB — GLUCOSE, CAPILLARY: Glucose-Capillary: 127 mg/dL — ABNORMAL HIGH (ref 70–99)

## 2013-03-30 SURGERY — LUMBAR LAMINECTOMY/DECOMPRESSION MICRODISCECTOMY 1 LEVEL
Anesthesia: General | Wound class: Clean

## 2013-03-30 MED ORDER — VERAPAMIL HCL 240 MG (CO) PO TB24: 240.0000 mg | ORAL_TABLET | Freq: Every morning | ORAL | Status: DC

## 2013-03-30 MED ORDER — PROPOFOL 10 MG/ML IV BOLUS
INTRAVENOUS | Status: DC | PRN
  Administered 2013-03-30: 200 mg via INTRAVENOUS

## 2013-03-30 MED ORDER — NEOSTIGMINE METHYLSULFATE 1 MG/ML IJ SOLN
INTRAMUSCULAR | Status: DC | PRN
  Administered 2013-03-30: 4 mg via INTRAVENOUS

## 2013-03-30 MED ORDER — ONDANSETRON HCL 4 MG/2ML IJ SOLN
4.0000 mg | INTRAMUSCULAR | Status: DC | PRN
  Filled 2013-03-30: qty 2

## 2013-03-30 MED ORDER — VANCOMYCIN HCL 10 G IV SOLR
1500.0000 mg | Freq: Two times a day (BID) | INTRAVENOUS | Status: AC
  Administered 2013-03-30: 1500 mg via INTRAVENOUS
  Filled 2013-03-30: qty 1500

## 2013-03-30 MED ORDER — SODIUM CHLORIDE 0.9 % IJ SOLN: 3.0000 mL | INTRAMUSCULAR | Status: DC | PRN

## 2013-03-30 MED ORDER — OXYCODONE HCL 5 MG PO TABS: 2.5000 mg | ORAL_TABLET | ORAL | Status: DC | PRN

## 2013-03-30 MED ORDER — DEXAMETHASONE SODIUM PHOSPHATE 10 MG/ML IJ SOLN
INTRAMUSCULAR | Status: DC | PRN
  Administered 2013-03-30: 5 mg via INTRAVENOUS

## 2013-03-30 MED ORDER — CLOBETASOL PROPIONATE 0.05 % EX SOLN: 1.0000 "application " | Freq: Two times a day (BID) | CUTANEOUS | Status: DC | PRN

## 2013-03-30 MED ORDER — LABETALOL HCL 5 MG/ML IV SOLN
INTRAVENOUS | Status: DC | PRN
  Administered 2013-03-30: 10 mg via INTRAVENOUS

## 2013-03-30 MED ORDER — ALBUTEROL SULFATE HFA 108 (90 BASE) MCG/ACT IN AERS: 2.0000 | INHALATION_SPRAY | RESPIRATORY_TRACT | Status: DC | PRN

## 2013-03-30 MED ORDER — PHENOL 1.4 % MT LIQD: 1.0000 | OROMUCOSAL | Status: DC | PRN

## 2013-03-30 MED ORDER — LIDOCAINE HCL (CARDIAC) 20 MG/ML IV SOLN
INTRAVENOUS | Status: DC | PRN
  Administered 2013-03-30: 60 mg via INTRAVENOUS

## 2013-03-30 MED ORDER — SODIUM CHLORIDE 0.9 % IR SOLN
Status: DC | PRN
  Administered 2013-03-30: 11:00:00

## 2013-03-30 MED ORDER — LOSARTAN POTASSIUM 50 MG PO TABS
50.0000 mg | ORAL_TABLET | Freq: Every morning | ORAL | Status: DC
Start: 2013-03-30 — End: 2013-04-01
  Administered 2013-03-31 – 2013-04-01 (×2): 50 mg via ORAL
  Filled 2013-03-30 (×3): qty 1

## 2013-03-30 MED ORDER — PANTOPRAZOLE SODIUM 40 MG PO TBEC
40.0000 mg | DELAYED_RELEASE_TABLET | Freq: Every day | ORAL | Status: DC
  Administered 2013-03-31 – 2013-04-01 (×2): 40 mg via ORAL
  Filled 2013-03-30 (×2): qty 1

## 2013-03-30 MED ORDER — OXYCODONE-ACETAMINOPHEN 5-325 MG PO TABS
1.0000 | ORAL_TABLET | ORAL | Status: DC | PRN
  Administered 2013-03-31: 2 via ORAL
  Filled 2013-03-30: qty 2

## 2013-03-30 MED ORDER — SODIUM CHLORIDE 0.9 % IJ SOLN: 3.0000 mL | Freq: Two times a day (BID) | INTRAMUSCULAR | Status: DC

## 2013-03-30 MED ORDER — PROMETHAZINE HCL 25 MG/ML IJ SOLN: 6.2500 mg | INTRAMUSCULAR | Status: DC | PRN

## 2013-03-30 MED ORDER — MEPERIDINE HCL 50 MG/ML IJ SOLN: 6.2500 mg | INTRAMUSCULAR | Status: DC | PRN

## 2013-03-30 MED ORDER — ACETAMINOPHEN 325 MG PO TABS: 650.0000 mg | ORAL_TABLET | ORAL | Status: DC | PRN

## 2013-03-30 MED ORDER — AMITRIPTYLINE HCL 50 MG PO TABS
50.0000 mg | ORAL_TABLET | Freq: Every day | ORAL | Status: DC
  Administered 2013-03-30 – 2013-03-31 (×2): 50 mg via ORAL
  Filled 2013-03-30 (×3): qty 1

## 2013-03-30 MED ORDER — MONTELUKAST SODIUM 10 MG PO TABS
10.0000 mg | ORAL_TABLET | Freq: Every day | ORAL | Status: DC
  Administered 2013-03-31 – 2013-04-01 (×2): 10 mg via ORAL
  Filled 2013-03-30 (×2): qty 1

## 2013-03-30 MED ORDER — HYDROCODONE-ACETAMINOPHEN 5-325 MG PO TABS
1.0000 | ORAL_TABLET | ORAL | Status: DC | PRN
  Administered 2013-03-30 – 2013-03-31 (×3): 2 via ORAL
  Filled 2013-03-30 (×3): qty 2

## 2013-03-30 MED ORDER — SODIUM CHLORIDE 0.9 % IV SOLN: 250.0000 mL | INTRAVENOUS | Status: DC

## 2013-03-30 MED ORDER — LACTATED RINGERS IV SOLN
INTRAVENOUS | Status: DC
  Administered 2013-03-30: 11:00:00 via INTRAVENOUS
  Administered 2013-03-30: 1000 mL via INTRAVENOUS

## 2013-03-30 MED ORDER — METHOCARBAMOL 500 MG PO TABS: 500.0000 mg | ORAL_TABLET | Freq: Three times a day (TID) | ORAL | Status: DC | PRN

## 2013-03-30 MED ORDER — HYDROMORPHONE HCL PF 1 MG/ML IJ SOLN
0.2500 mg | INTRAMUSCULAR | Status: DC | PRN
  Administered 2013-03-30 (×2): 0.5 mg via INTRAVENOUS

## 2013-03-30 MED ORDER — OXYCODONE-ACETAMINOPHEN 7.5-325 MG PO TABS: 1.0000 | ORAL_TABLET | ORAL | Status: DC | PRN

## 2013-03-30 MED ORDER — MOMETASONE FURO-FORMOTEROL FUM 100-5 MCG/ACT IN AERO
2.0000 | INHALATION_SPRAY | Freq: Two times a day (BID) | RESPIRATORY_TRACT | Status: DC
  Administered 2013-03-30 – 2013-04-01 (×4): 2 via RESPIRATORY_TRACT
  Filled 2013-03-30: qty 8.8

## 2013-03-30 MED ORDER — MIDAZOLAM HCL 5 MG/5ML IJ SOLN
INTRAMUSCULAR | Status: DC | PRN
  Administered 2013-03-30: 2 mg via INTRAVENOUS

## 2013-03-30 MED ORDER — NORETHIN-ETH ESTRAD-FE BIPHAS 1 MG-10 MCG / 10 MCG PO TABS
1.0000 | ORAL_TABLET | Freq: Every day | ORAL | Status: DC
  Administered 2013-03-31: 1 via ORAL

## 2013-03-30 MED ORDER — GLYCOPYRROLATE 0.2 MG/ML IJ SOLN
INTRAMUSCULAR | Status: DC | PRN
  Administered 2013-03-30: 0.6 mg via INTRAVENOUS

## 2013-03-30 MED ORDER — SODIUM CHLORIDE 0.45 % IV SOLN
INTRAVENOUS | Status: AC
  Administered 2013-03-30: 15:00:00 via INTRAVENOUS

## 2013-03-30 MED ORDER — VERAPAMIL HCL ER 240 MG PO TBCR
240.0000 mg | EXTENDED_RELEASE_TABLET | Freq: Every day | ORAL | Status: DC
  Administered 2013-03-30 – 2013-03-31 (×2): 240 mg via ORAL
  Filled 2013-03-30 (×3): qty 1

## 2013-03-30 MED ORDER — BUPIVACAINE-EPINEPHRINE 0.5% -1:200000 IJ SOLN
INTRAMUSCULAR | Status: DC | PRN
  Administered 2013-03-30: 5 mL

## 2013-03-30 MED ORDER — LACTATED RINGERS IV SOLN: INTRAVENOUS | Status: DC

## 2013-03-30 MED ORDER — METHOCARBAMOL 500 MG PO TABS
500.0000 mg | ORAL_TABLET | Freq: Four times a day (QID) | ORAL | Status: DC | PRN
  Administered 2013-03-30: 500 mg via ORAL
  Filled 2013-03-30: qty 1

## 2013-03-30 MED ORDER — POLYETHYLENE GLYCOL 3350 17 GM/SCOOP PO POWD
17.0000 g | Freq: Every day | ORAL | Status: DC
  Filled 2013-03-30: qty 255

## 2013-03-30 MED ORDER — LORATADINE 10 MG PO TABS
10.0000 mg | ORAL_TABLET | Freq: Every day | ORAL | Status: DC
  Administered 2013-03-31 – 2013-04-01 (×2): 10 mg via ORAL
  Filled 2013-03-30 (×3): qty 1

## 2013-03-30 MED ORDER — METOCLOPRAMIDE HCL 5 MG/ML IJ SOLN
INTRAMUSCULAR | Status: DC | PRN
  Administered 2013-03-30: 10 mg via INTRAVENOUS

## 2013-03-30 MED ORDER — THROMBIN 5000 UNITS EX SOLR
CUTANEOUS | Status: DC | PRN
  Administered 2013-03-30: 10000 [IU] via TOPICAL

## 2013-03-30 MED ORDER — ROCURONIUM BROMIDE 100 MG/10ML IV SOLN
INTRAVENOUS | Status: DC | PRN
  Administered 2013-03-30 (×2): 10 mg via INTRAVENOUS
  Administered 2013-03-30: 5 mg via INTRAVENOUS
  Administered 2013-03-30: 50 mg via INTRAVENOUS

## 2013-03-30 MED ORDER — ONDANSETRON HCL 4 MG/2ML IJ SOLN
INTRAMUSCULAR | Status: DC | PRN
  Administered 2013-03-30: 4 mg via INTRAVENOUS

## 2013-03-30 MED ORDER — FENTANYL CITRATE 0.05 MG/ML IJ SOLN
INTRAMUSCULAR | Status: DC | PRN
  Administered 2013-03-30 (×2): 50 ug via INTRAVENOUS
  Administered 2013-03-30: 25 ug via INTRAVENOUS
  Administered 2013-03-30: 50 ug via INTRAVENOUS
  Administered 2013-03-30: 100 ug via INTRAVENOUS
  Administered 2013-03-30: 25 ug via INTRAVENOUS
  Administered 2013-03-30: 50 ug via INTRAVENOUS

## 2013-03-30 MED ORDER — POLYETHYLENE GLYCOL 3350 17 G PO PACK
17.0000 g | PACK | Freq: Every day | ORAL | Status: DC
  Administered 2013-03-31: 17 g via ORAL

## 2013-03-30 MED ORDER — OXYCODONE-ACETAMINOPHEN 5-325 MG PO TABS: 1.0000 | ORAL_TABLET | ORAL | Status: DC | PRN

## 2013-03-30 MED ORDER — VENLAFAXINE HCL ER 75 MG PO CP24
75.0000 mg | ORAL_CAPSULE | Freq: Every day | ORAL | Status: DC
  Administered 2013-03-31 – 2013-04-01 (×2): 75 mg via ORAL
  Filled 2013-03-30 (×3): qty 1

## 2013-03-30 MED ORDER — HEMOSTATIC AGENTS (NO CHARGE) OPTIME
TOPICAL | Status: DC | PRN
  Administered 2013-03-30: 1 via TOPICAL

## 2013-03-30 MED ORDER — FERROUS FUMARATE 325 (106 FE) MG PO TABS
1.0000 | ORAL_TABLET | Freq: Every day | ORAL | Status: DC
  Administered 2013-03-31 – 2013-04-01 (×2): 106 mg via ORAL
  Filled 2013-03-30 (×3): qty 1

## 2013-03-30 MED ORDER — ACETAMINOPHEN 650 MG RE SUPP: 650.0000 mg | RECTAL | Status: DC | PRN

## 2013-03-30 MED ORDER — HYDROMORPHONE HCL PF 1 MG/ML IJ SOLN: 0.5000 mg | INTRAMUSCULAR | Status: DC | PRN

## 2013-03-30 MED ORDER — FUROSEMIDE 20 MG PO TABS
20.0000 mg | ORAL_TABLET | Freq: Every morning | ORAL | Status: DC
Start: 2013-03-31 — End: 2013-04-01
  Administered 2013-03-31 – 2013-04-01 (×2): 20 mg via ORAL
  Filled 2013-03-30 (×2): qty 1

## 2013-03-30 MED ORDER — METHOCARBAMOL 100 MG/ML IJ SOLN
500.0000 mg | Freq: Four times a day (QID) | INTRAVENOUS | Status: DC | PRN
  Filled 2013-03-30: qty 5

## 2013-03-30 MED ORDER — MENTHOL 3 MG MT LOZG: 1.0000 | LOZENGE | OROMUCOSAL | Status: DC | PRN

## 2013-03-30 MED ORDER — DOCUSATE SODIUM 100 MG PO CAPS
100.0000 mg | ORAL_CAPSULE | Freq: Two times a day (BID) | ORAL | Status: DC
  Administered 2013-03-30 – 2013-04-01 (×4): 100 mg via ORAL

## 2013-03-30 SURGICAL SUPPLY — 54 items
APL SKNCLS STERI-STRIP NONHPOA (GAUZE/BANDAGES/DRESSINGS)
BAG SPEC THK2 15X12 ZIP CLS (MISCELLANEOUS) ×1
BAG ZIPLOCK 12X15 (MISCELLANEOUS) ×2 IMPLANT
BENZOIN TINCTURE PRP APPL 2/3 (GAUZE/BANDAGES/DRESSINGS) ×2 IMPLANT
CHLORAPREP W/TINT 26ML (MISCELLANEOUS) IMPLANT
CLEANER TIP ELECTROSURG 2X2 (MISCELLANEOUS) ×2 IMPLANT
CLOTH 2% CHLOROHEXIDINE 3PK (PERSONAL CARE ITEMS) ×2 IMPLANT
CLOTH BEACON ORANGE TIMEOUT ST (SAFETY) ×2 IMPLANT
CONT SPEC 4OZ CLIKSEAL STRL BL (MISCELLANEOUS) ×1 IMPLANT
DECANTER SPIKE VIAL GLASS SM (MISCELLANEOUS) ×1 IMPLANT
DRAPE MICROSCOPE LEICA (MISCELLANEOUS) ×2 IMPLANT
DRAPE POUCH INSTRU U-SHP 10X18 (DRAPES) ×2 IMPLANT
DRAPE SURG 17X11 SM STRL (DRAPES) ×2 IMPLANT
DRSG AQUACEL AG ADV 3.5X 6 (GAUZE/BANDAGES/DRESSINGS) ×1 IMPLANT
DRSG EMULSION OIL 3X3 NADH (GAUZE/BANDAGES/DRESSINGS) IMPLANT
DRSG PAD ABDOMINAL 8X10 ST (GAUZE/BANDAGES/DRESSINGS) IMPLANT
DRSG TELFA 4X5 ISLAND ADH (GAUZE/BANDAGES/DRESSINGS) IMPLANT
DURAPREP 26ML APPLICATOR (WOUND CARE) ×2 IMPLANT
ELECT REM PT RETURN 9FT ADLT (ELECTROSURGICAL) ×2
ELECTRODE REM PT RTRN 9FT ADLT (ELECTROSURGICAL) ×1 IMPLANT
GLOVE BIOGEL PI IND STRL 7.5 (GLOVE) ×1 IMPLANT
GLOVE BIOGEL PI IND STRL 8 (GLOVE) ×1 IMPLANT
GLOVE BIOGEL PI INDICATOR 7.5 (GLOVE) ×1
GLOVE BIOGEL PI INDICATOR 8 (GLOVE) ×1
GLOVE SURG SS PI 7.5 STRL IVOR (GLOVE) ×2 IMPLANT
GLOVE SURG SS PI 8.0 STRL IVOR (GLOVE) ×4 IMPLANT
GOWN PREVENTION PLUS LG XLONG (DISPOSABLE) ×2 IMPLANT
GOWN STRL REIN XL XLG (GOWN DISPOSABLE) ×4 IMPLANT
IV CATH 14GX2 1/4 (CATHETERS) ×1 IMPLANT
KIT BASIN OR (CUSTOM PROCEDURE TRAY) ×2 IMPLANT
KIT POSITIONING SURG ANDREWS (MISCELLANEOUS) ×2 IMPLANT
MANIFOLD NEPTUNE II (INSTRUMENTS) ×2 IMPLANT
NDL SPNL 18GX3.5 QUINCKE PK (NEEDLE) ×3 IMPLANT
NEEDLE SPNL 18GX3.5 QUINCKE PK (NEEDLE) ×4 IMPLANT
PATTIES SURGICAL .5 X.5 (GAUZE/BANDAGES/DRESSINGS) IMPLANT
PATTIES SURGICAL .75X.75 (GAUZE/BANDAGES/DRESSINGS) IMPLANT
PATTIES SURGICAL 1X1 (DISPOSABLE) IMPLANT
SPONGE SURGIFOAM ABS GEL 100 (HEMOSTASIS) ×2 IMPLANT
STAPLER VISISTAT (STAPLE) IMPLANT
STRIP CLOSURE SKIN 1/2X4 (GAUZE/BANDAGES/DRESSINGS) ×1 IMPLANT
SUT PROLENE 3 0 PS 2 (SUTURE) IMPLANT
SUT VIC AB 0 CT1 27 (SUTURE)
SUT VIC AB 0 CT1 27XBRD ANTBC (SUTURE) IMPLANT
SUT VIC AB 1 CT1 27 (SUTURE) ×2
SUT VIC AB 1 CT1 27XBRD ANTBC (SUTURE) ×1 IMPLANT
SUT VIC AB 1-0 CT2 27 (SUTURE) IMPLANT
SUT VIC AB 2-0 CT1 27 (SUTURE) ×2
SUT VIC AB 2-0 CT1 TAPERPNT 27 (SUTURE) ×1 IMPLANT
SUT VIC AB 2-0 CT2 27 (SUTURE) ×4 IMPLANT
SUT VICRYL 0 27 CT2 27 ABS (SUTURE) ×4 IMPLANT
SUT VICRYL 0 UR6 27IN ABS (SUTURE) IMPLANT
SYRINGE 10CC LL (SYRINGE) ×4 IMPLANT
TRAY LAMINECTOMY (CUSTOM PROCEDURE TRAY) ×2 IMPLANT
YANKAUER SUCT BULB TIP NO VENT (SUCTIONS) ×2 IMPLANT

## 2013-03-30 NOTE — Transfer of Care (Signed)
Immediate Anesthesia Transfer of Care Note  Patient: Dominique Evans  Procedure(s) Performed: Procedure(s): REDO MICRO LUMBAR DECOMPRESSION L4-5 (N/A)  Patient Location: PACU  Anesthesia Type:General  Level of Consciousness: awake, patient cooperative and responds to stimulation, drowsy, ventilating well  Airway & Oxygen Therapy: Patient Spontanous Breathing and Patient connected to face mask oxygen  Post-op Assessment: Report given to PACU RN, Post -op Vital signs reviewed and stable and Patient moving all extremities X 4  Post vital signs: Reviewed and stable  Complications: No apparent anesthesia complications

## 2013-03-30 NOTE — Anesthesia Preprocedure Evaluation (Addendum)
Anesthesia Evaluation  Patient identified by MRN, date of birth, ID band Patient awake    Reviewed: Allergy & Precautions, H&P , NPO status , Patient's Chart, lab work & pertinent test results  History of Anesthesia Complications (+) PONV  Airway Mallampati: II TM Distance: >3 FB Neck ROM: full    Dental no notable dental hx. (+) Teeth Intact and Dental Advisory Given   Pulmonary asthma ,  breath sounds clear to auscultation  Pulmonary exam normal       Cardiovascular Exercise Tolerance: Good hypertension, Pt. on medications Rhythm:regular Rate:Normal     Neuro/Psych PSYCHIATRIC DISORDERS Anxiety Depression negative neurological ROS  negative psych ROS   GI/Hepatic negative GI ROS, Neg liver ROS, GERD-  Controlled,  Endo/Other  Morbid obesity  Renal/GU negative Renal ROS  negative genitourinary   Musculoskeletal   Abdominal (+) + obese,   Peds  Hematology negative hematology ROS (+)   Anesthesia Other Findings   Reproductive/Obstetrics negative OB ROS                          Anesthesia Physical  Anesthesia Plan  ASA: III  Anesthesia Plan: General   Post-op Pain Management:    Induction: Intravenous  Airway Management Planned: Oral ETT  Additional Equipment:   Intra-op Plan:   Post-operative Plan: Extubation in OR  Informed Consent: I have reviewed the patients History and Physical, chart, labs and discussed the procedure including the risks, benefits and alternatives for the proposed anesthesia with the patient or authorized representative who has indicated his/her understanding and acceptance.   Dental Advisory Given  Plan Discussed with: CRNA and Surgeon  Anesthesia Plan Comments:         Anesthesia Quick Evaluation

## 2013-03-30 NOTE — Anesthesia Postprocedure Evaluation (Signed)
  Anesthesia Post-op Note  Patient: Dominique Evans  Procedure(s) Performed: Procedure(s) (LRB): REDO MICRO LUMBAR DECOMPRESSION L4-5 (N/A)  Patient Location: PACU  Anesthesia Type: General  Level of Consciousness: awake and alert   Airway and Oxygen Therapy: Patient Spontanous Breathing  Post-op Pain: mild  Post-op Assessment: Post-op Vital signs reviewed, Patient's Cardiovascular Status Stable, Respiratory Function Stable, Patent Airway and No signs of Nausea or vomiting  Last Vitals:  Filed Vitals:   03/30/13 1505  BP: 110/72  Pulse: 79  Temp: 36.9 C  Resp: 18    Post-op Vital Signs: stable   Complications: No apparent anesthesia complications

## 2013-03-30 NOTE — Interval H&P Note (Signed)
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 After consideration of risks, benefits and other options for treatment, the patient has consented to  Procedure(s): REDO MICRO LUMBAR DECOMPRESSION L4-5 (N/A) as a surgical intervention .  The patient's history has been reviewed, patient examined, no change in status, stable for surgery.  I have reviewed the patient's chart and labs.  Questions were answered to the patient's satisfaction.     Joshia Kitchings C

## 2013-03-30 NOTE — H&P (View-Only) (Signed)
Dominique Evans is an 47 y.o. female.   Chief Complaint: back and left leg pain HPI: The patient is a 47 year old female who presents today for follow up of their left-sided back pain. They are now 3.5 months out from decompression @ L4-5. Symptoms reported today include: pain (low back), weakness, numbness (left leg) and leg pain (left). Current treatment includes cane. The following medication has been used for pain control: Oxycodone. The patient reports their current pain level to be moderate. She still has complaints of the severe pain into the left leg. Minimal help from the [facility], it was temporary, this was at L5-S1. She no longer has pain that radiates into the outer aspect of the foot down into the dorsum and into the medial aspect. She reports doing great postoperatively. No pain until she fell and fractured her toe. She slipped in the tub. She is having minimal to no back pain.  Past Medical History  Diagnosis Date  . Unspecified essential hypertension   . Hyperlipidemia   . GERD (gastroesophageal reflux disease)   . 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 Chronic constipation   . Obesity   . Asthma     PFT 08/12/07: FEV1 2.97 (104%), FEV1% 84, TLC 4.89 (90%), DLCO 84%, +BD  . Allergic rhinitis   . Panic attacks   . Hydatidiform mole   . Visual disturbance   . PONV (postoperative nausea and vomiting)   . Anemia   . Blood transfusion     hx of 2002   . 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 Depression   . Psoriasis   . Hernia     LLQ (after kidney removed)  . Diabetes mellitus     pre gastric bypass  . Cough 03/23/13  . Peripheral neuropathy     bilateral feet    Past Surgical History  Procedure Laterality Date  . Nephrectomy  2002    left  . Elbow fracture surgery      left  . Shoulder arthroscopy      right shoulder  . Laparoscopic gastric banding  02/2006    w/ truncal vagotomy  . Patent  ductus arterious repair    . Lap band removal     . 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 Lumbar laminectomy/decompression microdiscectomy Left 11/30/2012    Procedure: MICRO LUMBAR DECOMPRESSION L4-5 ON THE LEFT;  Surgeon: Javier Docker, MD;  Location: [facility];  Service: Orthopedics;  Laterality: Left;  MICRO LUMBAR DECOMPRESSION L4-5 ON THE LEFT    Family History  Problem Relation Age of Onset  . Hypertension Father   . Skin cancer Father   . Cancer Father     melanoma  . Heart disease Father   . Diabetes Mother   . Hypertension Mother   . Skin cancer Mother   . Cancer Mother     Melanoma,Breast  . Heart disease Mother   . Hypertension Sister   . Heart disease Sister   . Breast cancer      aunt  . Cancer Maternal Aunt     breast   Social History:  reports that she quit smoking about 30 years ago. Her smoking use included Cigarettes. She smoked 2.00 packs per  day. She has never used smokeless tobacco. She reports that she does not drink alcohol or use illicit drugs.  Allergies:  Allergies  Allergen Reactions  . Penicillins Anaphylaxis  . Sulfonamide Derivatives Anaphylaxis  . Ciprofloxacin Hives     (Not in a hospital admission)  No results found for this or any previous visit (from the past 48 hour(s)). No results found.  Review of Systems  Constitutional: Negative.   HENT: Negative.   Eyes: Negative.   Respiratory: Negative.   Cardiovascular: Negative.   Gastrointestinal: Negative.   Genitourinary: Negative.   Musculoskeletal: Positive for back pain.  Skin: Negative.   Neurological: Positive for sensory change and focal weakness.  Endo/Heme/Allergies: Negative.   Psychiatric/Behavioral: Negative.     Last menstrual period 01/27/2013. Physical Exam  Constitutional: She is oriented to person, place, and time.  She appears well-developed and well-nourished. She appears distressed.  HENT:  Head: Normocephalic.  Eyes: Conjunctivae and EOM are normal. Pupils are equal, round, and reactive to light.  Neck: Normal range of motion. Neck supple.  Cardiovascular: Normal rate and regular rhythm.   Respiratory: Effort normal.  GI: Soft. Bowel sounds are normal.  Musculoskeletal:  On examination, severe distress. Straight leg raise produces buttock, thigh and calf pain. EHL is five minus over five tibialis anterior. Altered sensation in a global distribution, unchanged.   Neurological: She is alert and oriented to person, place, and time. She has normal reflexes.  Skin: Skin is warm and dry.  Psychiatric: She has a normal mood and affect.     X-rays show mild scoliosis. No instability in flexion and extension. MRI recurrent disk herniation L4-5 paracentral to the left into the foramen.  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 No instability in flexion and extension, minimal back pain, underlying scoliosis, multilevel degeneration.  I had an extensive discussion with the patient concerning the pathology, relevant anatomy and treatment options. We discussed living with the symptoms which she does not want to do versus lumbar decompression versus lumbar decompression with a fusion. We talked about the implications of a fusion in terms of the risks with underlying comorbidities as well as adjacent segment, scoliosis, and the issues associated with that. We also discussed re-do decompression. If she can avoid a postoperative injury, perhaps then successful outcome can be obtained. We spent considerable time discussing the issues surrounding her injury and how to prevent that. Hopefully that can be implemented. Certainly if she continues to rerupture, then she may have to have an extended fusion. At this point in time without instability or back pain, I do not feel committing to that would  be in her best interest. She has a fairly significant disk herniation. It is reasonable to proceed with that.  Plan redo microlumbar decompression L4-5  BISSELL, JACLYN M. for Dr. Shelle Iron 03/27/2013, 9:12 AM

## 2013-03-30 NOTE — Brief Op Note (Signed)
03/30/2013  12:18 PM  PATIENT:  Dominique Evans  47 y.o. 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  PROCEDURE:  Procedure(s): REDO MICRO LUMBAR DECOMPRESSION L4-5 (N/A)  SURGEON:  Surgeon(s) and Role:    * Javier Docker, MD - Primary  PHYSICIAN ASSISTANT:   ASSISTANTS: Bissell   ANESTHESIA:   general  EBL:  Total I/O In: 1000 [I.V.:1000] Out: 150 [Blood:150]  BLOOD ADMINISTERED:none  DRAINS: none   LOCAL MEDICATIONS USED:  MARCAINE     SPECIMEN:  No Specimen yes L45  DISPOSITION OF SPECIMEN:  PATHOLOGY  COUNTS:  YES  TOURNIQUET:  * No tourniquets in log *  DICTATION: .Other Dictation: Dictation Number [id number]  PLAN OF CARE: Admit for overnight observation  PATIENT DISPOSITION:  PACU - hemodynamically stable.   Delay start of Pharmacological VTE agent (>24hrs) due to surgical blood loss or risk of bleeding: yes

## 2013-03-31 ENCOUNTER — Encounter (HOSPITAL_COMMUNITY): Payer: Self-pay | Admitting: Specialist

## 2013-03-31 MED ORDER — OXYCODONE-ACETAMINOPHEN 5-325 MG PO TABS
1.0000 | ORAL_TABLET | ORAL | Status: DC | PRN
  Administered 2013-03-31 – 2013-04-01 (×4): 2 via ORAL
  Filled 2013-03-31 (×4): qty 2

## 2013-03-31 NOTE — Progress Notes (Signed)
Subjective: 1 Day Post-Op Procedure(s) (LRB): REDO MICRO LUMBAR DECOMPRESSION L4-5 (N/A) Patient reports pain as moderate.  Leg pain is resolved. Noting moderate incisional LBP not controlled by Norco. Was taking Percocet at home for pain. She is uncomfortable lying down and sitting, did not sleep much last night, is standing in room this AM. Voiding without difficulty. Had some heartburn this AM but no other c/o.  Objective: Vital signs in last 24 hours: Temp:  [97.9 F (36.6 C)-98.6 F (37 C)] 98.3 F (36.8 C) (06/13 0511) Pulse Rate:  [70-81] 70 (06/13 0511) Resp:  [13-22] 20 (06/13 0511) BP: (102-175)/(47-83) 175/78 mmHg (06/13 0511) SpO2:  [90 %-100 %] 100 % (06/13 0511) Weight:  [110.224 kg (243 lb)] 110.224 kg (243 lb) (06/12 1400)  Intake/Output from previous day: 06/12 0701 - 06/13 0700 In: 2475.8 [P.O.:480; I.V.:1995.8] Out: 1900 [Urine:1750; Blood:150] Intake/Output this shift:    No results found for this basename: HGB,  in the last 72 hours No results found for this basename: WBC, RBC, HCT, PLT,  in the last 72 hours No results found for this basename: NA, K, CL, CO2, BUN, CREATININE, GLUCOSE, CALCIUM,  in the last 72 hours No results found for this basename: LABPT, INR,  in the last 72 hours  Neurologically intact ABD soft Neurovascular intact Sensation intact distally Intact pulses distally Dorsiflexion/Plantar flexion intact Incision: dressing C/D/I and minimal dry bloody drainage on aquacel No cellulitis present Compartment soft No calf pain or sign of DVT  Assessment/Plan: 1 Day Post-Op Procedure(s) (LRB): REDO MICRO LUMBAR DECOMPRESSION L4-5 (N/A) Advance diet Up with therapy D/C IV fluids Plan for D/C to inpt rehab, consult placed Reviewed D/C instructions Will switch to Percocet for pain prn Follow up with Dr. Shelle Iron 10-14 days post-op Will discuss with Dr. Elissa Lovett, Dayna Barker. 03/31/2013, 7:43 AM

## 2013-03-31 NOTE — Evaluation (Addendum)
Physical Therapy Evaluation Patient Details Name: Dominique Evans MRN: [id number] DOB: Mar 04, 1966 Today's Date: 03/31/2013 Time: 6213-0865 PT Time Calculation (min): 21 min  PT Assessment / Plan / Recommendation Clinical Impression  47 y.o. female with h/o L4-5 microdecomprssion 3.5 months ago admitted for re-do of L4-5 microdecompression 2* injury from fall. Pt ambulated 120' with min/guard assist, independent with sit to/from stand. Reviewed safety/fall prevention, advised pt not to reach to floor, corrected orientation of quad cane feet. Suspect peripheral neuropathy is significant factor with falls.  Pt would benefit from acute PT to maximize safety and independence with mobility.     PT Assessment  Patient needs continued PT services    Follow Up Recommendations  OUTPATIENT PT for BALANCE TRAINING    Does the patient have the potential to tolerate intense rehabilitation      Barriers to Discharge None      Equipment Recommendations  None recommended by PT    Recommendations for Other Services OT consult   Frequency 7X/week    Precautions / Restrictions Precautions Precautions: Back Precaution Booklet Issued: Yes (comment) Restrictions Weight Bearing Restrictions: No   Pertinent Vitals/Pain *6/10 back -surgical site At rest and with walking premedicated**      Mobility  Transfers Transfers: Sit to Stand;Stand to Sit Sit to Stand: 6: Modified independent (Device/Increase time);From chair/3-in-1;With upper extremity assist;With armrests Stand to Sit: 6: Modified independent (Device/Increase time);To chair/3-in-1;With upper extremity assist;With armrests Ambulation/Gait Ambulation/Gait Assistance: 4: Min guard Ambulation Distance (Feet): 120 Feet Assistive device: Small based quad cane Ambulation/Gait Assistance Details: min/guard for safety 2* h/o falls Gait Pattern: Decreased step length - right;Decreased step length - left Gait velocity: decreased General  Gait Details: no LOB    Exercises     PT Diagnosis: Acute pain  PT Problem List: Obesity;Pain;Decreased activity tolerance;Decreased mobility PT Treatment Interventions: Gait training;Stair training;Functional mobility training;Therapeutic exercise;Therapeutic activities;Patient/family education   PT Goals Acute Rehab PT Goals PT Goal Formulation: With patient Time For Goal Achievement: 04/07/13 Potential to Achieve Goals: Good Pt will go Supine/Side to Sit: with modified independence PT Goal: Supine/Side to Sit - Progress: Goal set today Pt will Ambulate: >150 feet;with modified independence;with least restrictive assistive device PT Goal: Ambulate - Progress: Goal set today Pt will Go Up / Down Stairs: 1-2 stairs;with min assist;with cane PT Goal: Up/Down Stairs - Progress: Goal set today  Visit Information  Last PT Received On: 03/31/13 Assistance Needed: +1    Subjective Data  Subjective: If I drop something on the floor, how do I pick it up? Patient Stated Goal: pt wants inpt rehab due to h/o falls   Prior Functioning  Home Living Lives With: Son (40 y.o. son) Available Help at Discharge: Family Type of Home: Apartment Home Access: Stairs to enter Secretary/administrator of Steps: 1 Home Layout: One level Home Adaptive Equipment: Quad cane;Walker - rolling Additional Comments: used quad cane prior to admission Prior Function Level of Independence: Independent with assistive device(s) Communication Communication: No difficulties    Cognition  Cognition Arousal/Alertness: Awake/alert Behavior During Therapy: WFL for tasks assessed/performed Overall Cognitive Status: Within Functional Limits for tasks assessed    Extremity/Trunk Assessment Right Upper Extremity Assessment RUE ROM/Strength/Tone: WFL for tasks assessed Left Upper Extremity Assessment LUE ROM/Strength/Tone: WFL for tasks assessed Right Lower Extremity Assessment RLE ROM/Strength/Tone: Within  functional levels RLE Sensation: History of peripheral neuropathy RLE Coordination: WFL - gross/fine motor Left Lower Extremity Assessment LLE ROM/Strength/Tone: Within functional levels LLE Sensation: History of peripheral neuropathy LLE Coordination:  WFL - gross/fine motor Trunk Assessment Trunk Assessment: Normal   Balance    End of Session PT - End of Session Equipment Utilized During Treatment: Gait belt Activity Tolerance: Patient tolerated treatment well Patient left: in chair;with call bell/phone within reach Nurse Communication: Mobility status  GP     Ralene Bathe Kistler 03/31/2013, 9:29 AM [phone]

## 2013-03-31 NOTE — Op Note (Signed)
NAMEALIESHA, DOLATA           ACCOUNT NO.:  [id number]  MEDICAL RECORD NO.:  [id number]  LOCATION:  1613                         FACILITY:  [facility]  PHYSICIAN:  Jene Every, M.D.    DATE OF BIRTH:  [birth year]  DATE OF PROCEDURE:  03/30/2013 DATE OF DISCHARGE:                              OPERATIVE REPORT   PREOPERATIVE DIAGNOSES: 1. Recurrent disk herniation, L4-5, left. 2. Spinal stenosis. 3. Morbid obesity.  POSTOPERATIVE DIAGNOSES: 1. Recurrent disk herniation, L4-5, left. 2. Spinal stenosis with fracture of the inferior spinous process of     L4. 3. Morbid obesity with kg weight of 110.  PROCEDURE PERFORMED: 1. Redo lumbar decompression L4-5, left. 2. Foraminotomies at L4 and L5. 3. Removal of the fractured inferior spinous process, inferior process     of L4.  ANESTHESIA:  General.  ASSISTANT:  Lanna Poche.  Technical difficulty increased due to patient's morbid obesity.  HISTORY:  This is a 47 year old female status post a lumbar decompression at L4-5 done well initially postoperatively had a fall in the shower fracturing her foot and producing any recurrent disk herniation at L4-5, refractory conservative treatment, mild terminal weakness, dermatomal dysesthesias.  MRI indicating foraminal disk herniation compressing the 4 and 5 root.  She was indicated for a redo decompression.  Risk and benefits discussed including bleeding, infection, damage to neurovascular, DVT, PE, anesthetic complications, need for fusion in future.  TECHNIQUE:  With the patient in supine position, after induction of adequate anesthesia, an 1.5 g of vancomycin was placed prone on the [place] frame.  All bony prominences well padded.  Lumbar region was prepped and draped in usual sterile fashion.  Previous surgical incision was utilized.  Scar excised.  Subcutaneous tissue was dissected by electrocautery to achieve hemostasis.  Dorsolumbar fascia identified and divided in  line with skin incision.  Paraspinous muscle elevated from lamina 4-5.  There is extensive scar tissue noted.  Used a curette to skeletonize the lamina L4 spinous, the facet and lamina 5 was evident. At the inferior process of 4 though was independently mobile. Indication of the eschar, we skeletonized this and then removed it with a 2-0 microcurette and a micropituitary.  Then, I identified the superior articulating process of 5.  We used a microcurette dealt a plane between the epidural fibrosis in the superior and superior medial portion of this.  We then used a 2-mm Kerrison to remove the portion of the process of 5.  Decompressing the lateral recess, performed foraminotomies of 5 and of 4.  Removed ligamentum flavum on the top of the L4 nerve root which was identified.  No hemilaminotomy at L4.  The epidural venous plexus was noted.  We gently identified the disk space at the medial superior aspect of the pedicle of L5.  With the 4 root axilla and the thecal sac well protected, we identified a herniated disk foraminally.  Performed an annulotomy.  Copious portion of disk material was removed from the disk space.  A straight pituitary further mobilized with a nerve hook and Epstein preserving the endplates.  The specimen sent to pathology.  This extended down in the foramen and medially. Following this, we were able to place a [facility]  retractor at foramen of 4 and 5 widely patent.  Bipolar electrocautery was utilized to achieve hemostasis with Thrombin-soaked Gelfoam.  We obtained a confirmatory radiograph.  Copiously irrigated disk space with antibiotic irrigation. Inspection revealed no CSF leakage or active bleeding.  Placed thrombin- soaked Gelfoam over the defect.  Removed the [facility] retractor. Paraspinous muscles inspected.  No evidence of active bleeding. Copiously irrigated the wound.  We closed the dorsolumbar fascia with 1 Vicryl interrupted figure-of-8 sutures, subcu  with multiple layers of 2- 0 which has a subcutaneous adipose tissue.  Skin was reapproximated with 4-0 subcuticular Prolene.  Wound reinforced with Steri-Strips.  Sterile dressing applied.  Placed supine on the hospital bed, extubated without difficulty, and transported to the recovery in satisfactory condition.  The patient tolerated the procedure well.  No complications. Injectables PA.  Minimal blood loss.     Jene Every, M.D.     Cordelia Pen  D:  03/30/2013  T:  03/31/2013  Job:  [id number]

## 2013-03-31 NOTE — Evaluation (Signed)
Occupational Therapy Evaluation Patient Details Name: Dominique Evans MRN: [id number] DOB: [birth year] Today's Date: 03/31/2013 Time: 4098-1191 OT Time Calculation (min): 20 min  OT Assessment / Plan / Recommendation Clinical Impression  This 47 year old female was admitted for Redo L4-5.  She has had some falls at home.  Reviewed education and pt verbalizes.  Recommend HHOT to further assess bathroom and help problem solve any tasks pt needs to do beyond basic ADLs    OT Assessment  All further OT needs can be met in the next venue of care    Follow Up Recommendations  Home health OT    Barriers to Discharge      Equipment Recommendations  3 in 1 bedside comode    Recommendations for Other Services    Frequency       Precautions / Restrictions Precautions Precautions: Back Precaution Booklet Issued: Yes (comment) Restrictions Weight Bearing Restrictions: No   Pertinent Vitals/Pain Pt uncomfortable--switched chairs    ADL  Toilet Transfer: Performed;Supervision/safety Statistician Method: Stand pivot Acupuncturist: Materials engineer and Hygiene: Supervision/safety Where Assessed - Engineer, mining and Hygiene: Standing Equipment Used:  (quad cane) Transfers/Ambulation Related to ADLs: spt x 2 to 3:1 and different chair ADL Comments: Pt has AE and feels comfortable with this and back precautions.  Educated on tub transfer bench but pt states it will not fit in her apt.  She has tried shower seat but it was too big and she had to sit sideways and was uncomfortable.  Pt has a clamp on grab bar.  Recommended that she have her son reapply it every time.  With h/o falls may want to sponge bathe or have someone nearby to help    OT Diagnosis: Generalized weakness  OT Problem List: Pain;Decreased knowledge of use of DME or AE OT Treatment Interventions:     OT Goals    Visit Information  Last OT Received On:  03/31/13 Assistance Needed: +1    Subjective Data  Subjective: I fell a couple of times:  getting off commode--my leg was numb and cleaning the tub.  I'm not going to do that anymore! Patient Stated Goal: Let this heal well   Prior Functioning     Home Living Lives With: Son Available Help at Discharge: Family Type of Home: Apartment Home Access: Stairs to enter Secretary/administrator of Steps: 1 Home Layout: One level Bathroom Shower/Tub: Engineer, manufacturing systems: Standard Home Adaptive Equipment: Therapist, sports - rolling Additional Comments: used quad cane prior to admission Prior Function Level of Independence: Independent with assistive device(s) Communication Communication: No difficulties         Vision/Perception     Cognition  Cognition Arousal/Alertness: Awake/alert Behavior During Therapy: WFL for tasks assessed/performed Overall Cognitive Status: Within Functional Limits for tasks assessed    Extremity/Trunk Assessment Right Upper Extremity Assessment RUE ROM/Strength/Tone: [facility] for tasks assessed Left Upper Extremity Assessment LUE ROM/Strength/Tone: WFL for tasks assessed Right Lower Extremity Assessment RLE ROM/Strength/Tone: Within functional levels RLE Sensation: History of peripheral neuropathy RLE Coordination: WFL - gross/fine motor Left Lower Extremity Assessment LLE ROM/Strength/Tone: Within functional levels LLE Sensation: History of peripheral neuropathy LLE Coordination: WFL - gross/fine motor Trunk Assessment Trunk Assessment: Normal     Mobility Transfers Sit to Stand: 6: Modified independent (Device/Increase time);From chair/3-in-1;With upper extremity assist;With armrests Stand to Sit: 6: Modified independent (Device/Increase time);To chair/3-in-1;With upper extremity assist;With armrests     Exercise     Balance  End of Session OT - End of Session Activity Tolerance: Patient tolerated treatment well Patient  left: in chair;with call bell/phone within reach  GO     [facility] 03/31/2013, 9:33 AM Marica Otter, OTR/L [phone] 03/31/2013

## 2013-03-31 NOTE — Progress Notes (Signed)
Received order for commode.  Patient declined.  No DME needs at this time.

## 2013-03-31 NOTE — Progress Notes (Signed)
CSW consulted for SNF placement. Chart reviewed . PT is recommending OUT PT Physical Therapy following hospital d/c. CSW and PT reviewed recommendations with pt at bedside. SNF placement is not recommended at this time.  Cori Razor LCSW [phone]

## 2013-03-31 NOTE — Care Management Note (Signed)
    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 Plans are for her to return to her home  where son will be offer support. Pt has cane. 3n1 recommended, pt states it will not fit in BR so will not get one. States she will not get tub bench because not covered by ins.   Anticipated DC Date:  04/01/2013   Anticipated DC Plan:  HOME/SELF CARE  In-house referral  Clinical Social Worker      DC Planning Services  CM consult      Choice offered to / List presented to:             Status of service:  Completed, signed off Medicare Important Message given?   (If response is "NO", the following Medicare IM given date fields will be blank) Date Medicare IM given:   Date Additional Medicare IM given:    Discharge Disposition:    Per UR Regulation:  Reviewed for med. necessity/level of care/duration of stay  If discussed at Long Length of Stay Meetings, dates discussed:    Comments:  03/31/2013 Colleen Can BSN RN CCM [phone] Recommendations made to patient regarding where to possibly get low cost equipment. Pt voices understanding.

## 2013-04-01 NOTE — Progress Notes (Signed)
Physical Therapy Treatment Patient Details Name: Dominique Evans MRN: [id number] DOB: 09-Sep-1966 Today's Date: 04/01/2013 Time: 3086-5784 PT Time Calculation (min): 16 min  PT Assessment / Plan / Recommendation Comments on Treatment Session  Pt progressing well with therapy.  She states she feels more comfortable ambulating with RW in hallway due to recent LOB, however demonstrates good balance with RW.  Did see her ambulate with quad cane in room and note that she tends to reach for wall/furniture for stability.  Recommend she use RW.     Follow Up Recommendations  Outpatient PT     Does the patient have the potential to tolerate intense rehabilitation     Barriers to Discharge        Equipment Recommendations  None recommended by PT    Recommendations for Other Services    Frequency 7X/week   Plan Discharge plan remains appropriate    Precautions / Restrictions Precautions Precautions: Back Precaution Booklet Issued: Yes (comment) Restrictions Weight Bearing Restrictions: No   Pertinent Vitals/Pain 5/10 pain    Mobility  Bed Mobility Bed Mobility: Not assessed (Pt in recliner when PT arrived. ) Transfers Transfers: Sit to Stand;Stand to Sit Sit to Stand: 6: Modified independent (Device/Increase time);From chair/3-in-1;With upper extremity assist;With armrests Stand to Sit: 6: Modified independent (Device/Increase time);To chair/3-in-1;With upper extremity assist;With armrests Ambulation/Gait Ambulation/Gait Assistance: 4: Min guard Ambulation Distance (Feet): 185 Feet Assistive device: Rolling walker Ambulation/Gait Assistance Details: Cues for maintaining position inside of RW and to maintain upright posture throughout as she tends to look down at feet.  Gait Pattern: Decreased step length - right;Decreased step length - left Gait velocity: decreased General Gait Details: no LOB Stairs: Yes Stairs Assistance: 4: Min assist Stairs Assistance Details (indicate cue  type and reason): Assist on pts right side as she did not use AD and does not have rails at home.  Pt aware of  sequence/technique.  Stair Management Technique: No rails;Step to pattern;Forwards Number of Stairs: 1 (x2 reps)    Exercises General Exercises - Lower Extremity Heel Raises: Strengthening;Both;15 reps;Standing (then just L leg x 10 reps w/ use of RW)   PT Diagnosis:    PT Problem List:   PT Treatment Interventions:     PT Goals Acute Rehab PT Goals PT Goal Formulation: With patient Time For Goal Achievement: 04/07/13 Potential to Achieve Goals: Good Pt will Ambulate: >150 feet;with modified independence;with least restrictive assistive device PT Goal: Ambulate - Progress: Progressing toward goal Pt will Go Up / Down Stairs: 1-2 stairs;with min assist;with cane PT Goal: Up/Down Stairs - Progress: Met  Visit Information  Last PT Received On: 04/01/13 Assistance Needed: +1    Subjective Data  Subjective: I always look down.  Patient Stated Goal: to return home today.    Cognition  Cognition Arousal/Alertness: Awake/alert Behavior During Therapy: WFL for tasks assessed/performed Overall Cognitive Status: Within Functional Limits for tasks assessed    Balance     End of Session PT - End of Session Activity Tolerance: Patient tolerated treatment well Patient left: in chair;with call bell/phone within reach Nurse Communication: Mobility status   GP     Vista Deck 04/01/2013, 10:16 AM

## 2013-04-01 NOTE — Progress Notes (Signed)
Patient continues to get out of bed alone and ambulates with a walker without assistance. Observed patient. Noted steady gait with walker.  Beginning of shift, reinforced the importance of safety and calling for assistance; information accepted.

## 2013-04-01 NOTE — Progress Notes (Signed)
Pt stable, d/c instructions, and scripts given with no questions/concerns voiced by pt or family.  Pt refused to wait on equipment so will be delivered to pt at home...[facility].  Pt transported to private vehicle via wheelchair by NT and family.

## 2013-04-01 NOTE — Progress Notes (Signed)
Pachia Strum  MRN: [id number] DOB/Age: 1965/11/04 47 y.o. Physician: Lynnea Maizes, M.D. 2 Days Post-Op Procedure(s) (LRB): REDO MICRO LUMBAR DECOMPRESSION L4-5 (N/A)  Subjective: Feeling much better today, good appetite, voiding, independently mobile with walker Vital Signs Temp:  [98.3 F (36.8 C)-98.9 F (37.2 C)] 98.9 F (37.2 C) (06/14 0506) Pulse Rate:  [72-82] 82 (06/14 0506) Resp:  [16-18] 16 (06/14 0506) BP: (136-169)/(73-97) 136/87 mmHg (06/14 0506) SpO2:  [97 %-100 %] 98 % (06/14 0506)  Lab Results No results found for this basename: WBC, HGB, HCT, PLT,  in the last 72 hours BMET No results found for this basename: NA, K, CL, CO2, GLUCOSE, BUN, CREATININE, CALCIUM,  in the last 72 hours INR  Date Value Range Status  12/30/2010 1.03  0.00 - 1.49 Final     Exam  Reports baseline neuropathy BLE, back pain improved.  Plan D/c home, plan for outpatient rehab. rx's on chart  Adia Crammer M 04/01/2013, 8:33 AM

## 2013-04-01 NOTE — Care Management Note (Signed)
Cm spoke with patient at bedside concerning dc planning. Per pt request BSC & tub bench for home dme use. MD orders entered. Pt request dme delivery to residence. [facility] dme rep notified. No other needs stated. Pt's mother present in room to provide tx home. Pt's mother and son to assist in home care.   Roxy Manns Haralambos Yeatts,RN,BSN [phone]

## 2013-04-03 NOTE — Progress Notes (Signed)
Discharge summary sent to payer through [facility]  

## 2013-04-10 NOTE — Discharge Summary (Signed)
Physician Discharge Summary   Patient ID: Dominique Evans MRN: [id number] DOB/AGE: [birth year] 47 y.o.  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 Unspecified essential hypertension   . Hyperlipidemia   . GERD (gastroesophageal reflux disease)   . 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 Chronic constipation   . Obesity   . Asthma     PFT 08/12/07: FEV1 2.97 (104%), FEV1% 84, TLC 4.89 (90%), DLCO 84%, +BD  . Allergic rhinitis   . Panic attacks   . Hydatidiform mole   . Visual disturbance   . PONV (postoperative nausea and vomiting)   . Anemia   . Blood transfusion     hx of 2002   . 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 Depression   . Psoriasis   . Hernia     LLQ (after kidney removed)  . Diabetes mellitus     pre gastric bypass  . Cough 03/23/13  . Peripheral neuropathy     bilateral feet   Discharge Diagnoses:   Principal Problem:   HNP (herniated nucleus pulposus), lumbar  Procedure:  Procedure(s) (LRB): REDO MICRO LUMBAR DECOMPRESSION L4-5 (N/A)  Tolerated well. No complications Consults: none HPI: see H&P     Laboratory Data: Hospital Outpatient Visit on 03/23/2013  Component Date Value Range Status  . Sodium 03/23/2013 140  135 - 145 mEq/L Final  . Potassium 03/23/2013 3.7  3.5 - 5.1 mEq/L Final  . Chloride 03/23/2013 101  96 - 112 mEq/L Final  . CO2 03/23/2013 29  19 - 32 mEq/L Final  . Glucose, Bld 03/23/2013 124* 70 - 99 mg/dL Final  . BUN 62/95/2841 18  6 - 23 mg/dL Final  . Creatinine, Ser 03/23/2013 1.07  0.50 - 1.10 mg/dL Final  . Calcium 32/44/0102 9.3  8.4 - 10.5 mg/dL Final  . GFR calc non Af Amer 03/23/2013 60* >90 mL/min Final  . GFR calc Af Amer 03/23/2013 70* >90 mL/min Final   Comment:                                 The eGFR has been calculated                   using the CKD EPI equation.                          This calculation has not been                          validated in all clinical                          situations.                          eGFR's persistently                          <90 mL/min signify                          possible  Chronic Kidney Disease.  . WBC 03/23/2013 8.6  4.0 - 10.5 K/uL Final  . RBC 03/23/2013 4.46  3.87 - 5.11 MIL/uL Final  . Hemoglobin 03/23/2013 12.7  12.0 - 15.0 g/dL Final  . HCT 40/98/1191 38.2  36.0 - 46.0 % Final  . MCV 03/23/2013 85.7  78.0 - 100.0 fL Final  . MCH 03/23/2013 28.5  26.0 - 34.0 pg Final  . MCHC 03/23/2013 33.2  30.0 - 36.0 g/dL Final  . RDW 47/82/9562 13.0  11.5 - 15.5 % Final  . Platelets 03/23/2013 248  150 - 400 K/uL Final  . MRSA, PCR 03/23/2013 NEGATIVE  NEGATIVE Final  . Staphylococcus aureus 03/23/2013 NEGATIVE  NEGATIVE Final   Comment:                                 The Xpert SA Assay (FDA                          approved for NASAL specimens                          in patients over 24 years of age),                          is one component of                          a comprehensive surveillance                          program.  Test performance has                          been validated by [employer] for patients greater                          than or equal to 23 year old.                          It is not intended                          to diagnose infection nor to                          guide or monitor treatment.  . Preg, Serum 03/23/2013 NEGATIVE  NEGATIVE Final   Comment:                                 THE SENSITIVITY OF THIS                          METHODOLOGY IS >10 mIU/mL.   No results found for this basename: HGB,  in the last 72 hours No results found for this basename: WBC, RBC, HCT, PLT,  in the last 72 hours No results found for this basename:  NA, K, CL, CO2, BUN, CREATININE, GLUCOSE, CALCIUM,   in the last 72 hours No results found for this basename: LABPT, INR,  in the last 72 hours  X-Rays:Dg Chest 2 View  03/23/2013   *RADIOLOGY REPORT*  Clinical Data: Preop for lumbar decompression.  CHEST - 2 VIEW  Comparison: 11/25/2012.  Findings: The cardiac silhouette, mediastinal and hilar contours are stable.  Again noted is a right-sided aortic arch.  The lungs are clear.  No pleural effusion.  The bony thorax is intact. Stable surgical changes in the left upper quadrant of the abdomen.  IMPRESSION: No acute cardiopulmonary findings.   Original Report Authenticated By: Rudie Meyer, M.D.   Dg Lumbar Spine 2-3 Views  03/23/2013   *RADIOLOGY REPORT*  Clinical Data: Preop for lumbar decompression surgery.  LUMBAR SPINE - 2-3 VIEW  Comparison: 11/25/2012.  Findings: Normal alignment of the lumbar vertebral bodies.  Stable degenerative disc disease at T12-L1.  No acute bony findings.  Five the lumbar type vertebral bodies are appropriately labeled and consistent with prior examination.  IMPRESSION: Normal alignment and no acute bony findings.   Original Report Authenticated By: Rudie Meyer, M.D.   Dg Spine Portable 1 View  03/30/2013   *RADIOLOGY REPORT*  Clinical Data: Lumbar spine decompressive surgery.  PORTABLE SPINE - 1 VIEW  Comparison: 03/23/2013.  Findings: The last open disc space is again labeled the L5-S1 level.  Metallic localizer with its tip projected at the inferior aspect of the L4 spinous process, at the L4-5 level.  IMPRESSION: Localizer at the L4-5 level.   Original Report Authenticated By: Beckie Salts, M.D.   Dg Spine Portable 1 View  03/30/2013   *RADIOLOGY REPORT*  Clinical Data: Surgical level L4-5.  Intraoperative examination.  PORTABLE SPINE - 1 VIEW  Comparison: 03/30/2013 and 03/23/2013.  Findings: A single intraoperative cross-table lateral view of the lumbar spine is submitted.  Numbering system utilized on 03/23/2013 is preserved.  A surgical instrument tip projects over  the posterior L4-5 interspace.  IMPRESSION: Intraoperative localization, as above.   Original Report Authenticated By: Leanna Battles, M.D.    EKG: Orders placed during the hospital encounter of 11/30/12  . EKG     Hospital Course: Patient was admitted to [facility] and taken to the OR and underwent the above state procedure without complications.  Patient tolerated the procedure well and was later transferred to the recovery room and then to the orthopaedic floor for postoperative care.  They were given PO and IV analgesics for pain control following their surgery.  They were given 24 hours of postoperative antibiotics.   PT was consulted postop to assist with mobility and transfers.  The patient was allowed to be WBAT with therapy and was taught back precautions. Discharge planning was consulted to help with postop disposition and equipment needs.  Patient had a good night on the evening of surgery and started to get up OOB with therapy on day one. Patient was seen in rounds and was ready to go home on day one.  They were given discharge instructions and dressing directions.  They were instructed on when to follow up in the office with Dr. Shelle Iron.  Discharge Medications: Prior to Admission medications   Medication Sig Start Date End Date Taking? Authorizing Provider  adalimumab (HUMIRA PEN) 40 MG/0.8ML injection Inject 40 mg into the skin every 14 (fourteen) days. Every other week  Last injection on 03/16/13   Yes Historical Provider, MD  amitriptyline (ELAVIL) 25 MG tablet  Take 50 mg by mouth at bedtime.    Yes Historical Provider, MD  CALCIUM-VITAMIN D PO Take 1 tablet by mouth daily.   Yes Historical Provider, MD  cetirizine (ZYRTEC) 10 MG tablet Take 10 mg by mouth daily.   Yes Historical Provider, MD  Cyanocobalamin (VITAMIN B12 PO) Take 1 tablet by mouth daily.    Yes Historical Provider, MD  desvenlafaxine (PRISTIQ) 50 MG 24 hr tablet Take 50 mg by mouth every morning.    Yes  Historical Provider, MD  dexlansoprazole (DEXILANT) 60 MG capsule Take 60 mg by mouth daily.    Yes Historical Provider, MD  ferrous fumarate (HEMOCYTE - 106 MG FE) 325 (106 FE) MG TABS Take 1 tablet by mouth daily.    Yes Historical Provider, MD  Fluticasone-Salmeterol (ADVAIR) 100-50 MCG/DOSE AEPB Inhale 1 puff into the lungs 2 (two) times daily. States still taking 03/23/13 01/11/12 03/30/13 Yes Coralyn Helling, MD  furosemide (LASIX) 20 MG tablet Take 20 mg by mouth every morning.    Yes Historical Provider, MD  losartan (COZAAR) 50 MG tablet Take 50 mg by mouth every morning.  09/21/11  Yes Historical Provider, MD  montelukast (SINGULAIR) 10 MG tablet Take 10 mg by mouth daily.    Yes Historical Provider, MD  Multiple Vitamins-Minerals (MULTIVITAMIN PO) Take 1 tablet by mouth daily.    Yes Historical Provider, MD  Norethindrone-Ethinyl Estradiol-Fe Biphas (LO LOESTRIN FE) 1 MG-10 MCG / 10 MCG tablet Take 1 tablet by mouth daily. 02/02/13  Yes Historical Provider, MD  polyethylene glycol (MIRALAX) powder Take 17 g by mouth daily. Mix 1 capful in 8 ounces of water daily   Yes Historical Provider, MD  PROAIR HFA 108 (90 BASE) MCG/ACT inhaler Inhale 2 puffs into the lungs every 4 (four) hours as needed for wheezing or shortness of breath. WHEEZING 08/26/11  Yes Historical Provider, MD  rosuvastatin (CRESTOR) 10 MG tablet Take 5 mg by mouth every morning.    Yes Historical Provider, MD  verapamil (COVERA HS) 240 MG (CO) 24 hr tablet Take 240 mg by mouth every morning. Take 1 tablet by mouth once daily   Yes Historical Provider, MD  methocarbamol (ROBAXIN) 500 MG tablet Take 1 tablet (500 mg total) by mouth 3 (three) times daily between meals as needed. 03/30/13   Javier Docker, MD  oxyCODONE-acetaminophen (PERCOCET) 7.5-325 MG per tablet Take 1-2 tablets by mouth every 4 (four) hours as needed for pain. 03/30/13   Javier Docker, MD    Diet: Regular Activity: Per back precautions Follow-up: 2  weeks Disposition - Home Discharged Condition:  good   Discharge Orders   Future Orders Complete By Expires     DME Bedside commode  As directed         Medication List    STOP taking these medications       oxyCODONE-acetaminophen 5-325 MG per tablet  Commonly known as:  PERCOCET/ROXICET      TAKE these medications       amitriptyline 25 MG tablet  Commonly known as:  ELAVIL  Take 50 mg by mouth at bedtime.     CALCIUM-VITAMIN D PO  Take 1 tablet by mouth daily.     cetirizine 10 MG tablet  Commonly known as:  ZYRTEC  Take 10 mg by mouth daily.     desvenlafaxine 50 MG 24 hr tablet  Commonly known as:  PRISTIQ  Take 50 mg by mouth every morning.     DEXILANT 60  MG capsule  Generic drug:  dexlansoprazole  Take 60 mg by mouth daily.     ferrous fumarate 325 (106 FE) MG Tabs  Commonly known as:  HEMOCYTE - 106 mg FE  Take 1 tablet by mouth daily.     Fluticasone-Salmeterol 100-50 MCG/DOSE Aepb  Commonly known as:  ADVAIR  Inhale 1 puff into the lungs 2 (two) times daily. States still taking 03/23/13     furosemide 20 MG tablet  Commonly known as:  LASIX  Take 20 mg by mouth every morning.     HUMIRA PEN 40 MG/0.8ML injection  Generic drug:  adalimumab  Inject 40 mg into the skin every 14 (fourteen) days. Every other week  Last injection on 03/16/13     LO LOESTRIN FE 1 MG-10 MCG / 10 MCG tablet  Generic drug:  Norethindrone-Ethinyl Estradiol-Fe Biphas  Take 1 tablet by mouth daily.     losartan 50 MG tablet  Commonly known as:  COZAAR  Take 50 mg by mouth every morning.     methocarbamol 500 MG tablet  Commonly known as:  ROBAXIN  Take 1 tablet (500 mg total) by mouth 3 (three) times daily between meals as needed.     MIRALAX powder  Generic drug:  polyethylene glycol powder  Take 17 g by mouth daily. Mix 1 capful in 8 ounces of water daily     montelukast 10 MG tablet  Commonly known as:  SINGULAIR  Take 10 mg by mouth daily.     MULTIVITAMIN PO   Take 1 tablet by mouth daily.     oxyCODONE-acetaminophen 7.5-325 MG per tablet  Commonly known as:  PERCOCET  Take 1-2 tablets by mouth every 4 (four) hours as needed for pain.     PROAIR HFA 108 (90 BASE) MCG/ACT inhaler  Generic drug:  albuterol  Inhale 2 puffs into the lungs every 4 (four) hours as needed for wheezing or shortness of breath. WHEEZING     rosuvastatin 10 MG tablet  Commonly known as:  CRESTOR  Take 5 mg by mouth every morning.     verapamil 240 MG (CO) 24 hr tablet  Commonly known as:  COVERA HS  Take 240 mg by mouth every morning. Take 1 tablet by mouth once daily     VITAMIN B12 PO  Take 1 tablet by mouth daily.           Follow-up Information   Follow up with Yandriel Boening C, MD In 2 weeks.   Contact information:   [address] [place] [address] [place] [address] [phone]       Signed: Javier Docker 04/10/2013, 8:33 PM

## 2013-05-10 ENCOUNTER — Other Ambulatory Visit (HOSPITAL_COMMUNITY): Payer: Self-pay | Admitting: Specialist

## 2013-05-10 ENCOUNTER — Ambulatory Visit (HOSPITAL_COMMUNITY)
Admission: RE | Admit: 2013-05-10 | Discharge: 2013-05-10 | Disposition: A | Discharge status: Home / Self Care | Payer: Managed Care, Other (non HMO) | Source: Ambulatory Visit | Attending: Cardiovascular Disease | Admitting: Cardiovascular Disease

## 2013-05-10 DIAGNOSIS — Z4789 Encounter for other orthopedic aftercare: Secondary | ICD-10-CM | POA: Insufficient documentation

## 2013-05-10 DIAGNOSIS — I82409 Acute embolism and thrombosis of unspecified deep veins of unspecified lower extremity: Secondary | ICD-10-CM

## 2013-05-10 DIAGNOSIS — I82403 Acute embolism and thrombosis of unspecified deep veins of lower extremity, bilateral: Secondary | ICD-10-CM

## 2013-05-10 NOTE — Progress Notes (Signed)
Lower Extremity Venous Duplex Completed. °Brianna L Mazza ° °

## 2013-05-29 ENCOUNTER — Other Ambulatory Visit: Payer: Self-pay | Admitting: *Deleted

## 2013-05-29 DIAGNOSIS — R609 Edema, unspecified: Secondary | ICD-10-CM

## 2013-06-27 ENCOUNTER — Encounter: Payer: Self-pay | Admitting: Vascular Surgery

## 2013-06-28 ENCOUNTER — Encounter: Payer: Self-pay | Admitting: Vascular Surgery

## 2013-06-28 ENCOUNTER — Encounter (INDEPENDENT_AMBULATORY_CARE_PROVIDER_SITE_OTHER): Payer: Managed Care, Other (non HMO) | Admitting: *Deleted

## 2013-06-28 ENCOUNTER — Ambulatory Visit (INDEPENDENT_AMBULATORY_CARE_PROVIDER_SITE_OTHER): Payer: Managed Care, Other (non HMO) | Admitting: Vascular Surgery

## 2013-06-28 VITALS — BP 119/82 | HR 77 | Resp 16 | Ht 66.5 in | Wt 242.0 lb

## 2013-06-28 DIAGNOSIS — R609 Edema, unspecified: Secondary | ICD-10-CM

## 2013-06-28 NOTE — Progress Notes (Signed)
Vascular and Vein Specialist of [facility]  Patient name: Dominique Evans MRN: [id number] DOB: [birth year] Sex: female  REASON FOR CONSULT: bilateral lower extremity swelling. Referred by Dr. Shelle Iron.  HPI: Dominique Evans is a 47 y.o. female Who has a long history of some mild bilateral lower extremity swelling. Over the last several months the swelling has increased. She has had 2 operations on her back from a posterior approach. She had excellent results from her first operation but then fell and re\re herniated disc and broke a bone in her back. She had subsequent surgery and is recovering from this. The swelling has gradually been worsening although recently has improved somewhat. She denies any history of DVT or phlebitis. There is no family history of clotting disorders.  I have reviewed her venous duplex scan which was performed on 05/10/2013. This showed no evidence of DVT in either lower extremity.  I've also reviewed her records from Dr. Ermelinda Das office. She has undergone previous lumbar decompression. She has done well with physical therapy.  Past Medical History  Diagnosis Date  . Unspecified essential hypertension   . Hyperlipidemia   . GERD (gastroesophageal reflux disease)   . 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 Chronic constipation   . Obesity   . Asthma     PFT 08/12/07: FEV1 2.97 (104%), FEV1% 84, TLC 4.89 (90%), DLCO 84%, +BD  . Allergic rhinitis   . Panic attacks   . Hydatidiform mole   . Visual disturbance   . PONV (postoperative nausea and vomiting)   . Anemia   . Blood transfusion     hx of 2002   . 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 Depression   . Psoriasis   . Hernia     LLQ (after kidney removed)  . Diabetes mellitus     pre gastric bypass  . Cough 03/23/13  . Peripheral neuropathy     bilateral feet   Family History  Problem Relation Age of Onset  . Hypertension Father   . Skin  cancer Father   . Cancer Father     melanoma  . Heart disease Father     before age 91  . Heart attack Father   . Hyperlipidemia Father   . Diabetes Mother   . Hypertension Mother   . Skin cancer Mother   . Cancer Mother     Melanoma,Breast  . Heart disease Mother   . Hyperlipidemia Mother   . Hypertension Sister   . Heart disease Sister     before age 68  . Heart attack Sister   . Hyperlipidemia Sister   . Breast cancer      aunt  . Cancer Maternal Aunt     breast   SOCIAL HISTORY: History  Substance Use Topics  . Smoking status: Former Smoker -- 2.00 packs/day    Types: Cigarettes    Quit date: 10/19/1982  . Smokeless tobacco: Never Used  . Alcohol Use: No   Allergies  Allergen Reactions  . Penicillins Anaphylaxis  . Sulfonamide Derivatives Anaphylaxis  . Ciprofloxacin Hives   Current Outpatient Prescriptions  Medication Sig Dispense Refill  . adalimumab (HUMIRA PEN) 40 MG/0.8ML injection Inject 40 mg into the skin every 14 (fourteen) days. Every other week  Last injection on 03/16/13      . amitriptyline (ELAVIL) 25 MG tablet Take 50 mg by mouth at bedtime.       Marland Kitchen  CALCIUM-VITAMIN D PO Take 1 tablet by mouth daily.      . cetirizine (ZYRTEC) 10 MG tablet Take 10 mg by mouth daily.      . Cyanocobalamin (VITAMIN B12 PO) Take 1 tablet by mouth daily.       Marland Kitchen desvenlafaxine (PRISTIQ) 50 MG 24 hr tablet Take 50 mg by mouth every morning.       Marland Kitchen dexlansoprazole (DEXILANT) 60 MG capsule Take 60 mg by mouth daily.       . ferrous fumarate (HEMOCYTE - 106 MG FE) 325 (106 FE) MG TABS Take 1 tablet by mouth daily.       . furosemide (LASIX) 20 MG tablet Take 20 mg by mouth every morning.       Marland Kitchen losartan (COZAAR) 50 MG tablet Take 50 mg by mouth every morning.       . methocarbamol (ROBAXIN) 500 MG tablet Take 1 tablet (500 mg total) by mouth 3 (three) times daily between meals as needed.  40 tablet  1  . montelukast (SINGULAIR) 10 MG tablet Take 10 mg by mouth daily.        . Multiple Vitamins-Minerals (MULTIVITAMIN PO) Take 1 tablet by mouth daily.       . Norethindrone-Ethinyl Estradiol-Fe Biphas (LO LOESTRIN FE) 1 MG-10 MCG / 10 MCG tablet Take 1 tablet by mouth daily.      Marland Kitchen oxyCODONE-acetaminophen (PERCOCET) 7.5-325 MG per tablet Take 1-2 tablets by mouth every 4 (four) hours as needed for pain.  60 tablet  0  . polyethylene glycol (MIRALAX) powder Take 17 g by mouth daily. Mix 1 capful in 8 ounces of water daily      . PROAIR HFA 108 (90 BASE) MCG/ACT inhaler Inhale 2 puffs into the lungs every 4 (four) hours as needed for wheezing or shortness of breath. WHEEZING      . rosuvastatin (CRESTOR) 10 MG tablet Take 5 mg by mouth every morning.       . verapamil (COVERA HS) 240 MG (CO) 24 hr tablet Take 240 mg by mouth every morning. Take 1 tablet by mouth once daily      . Fluticasone-Salmeterol (ADVAIR) 100-50 MCG/DOSE AEPB Inhale 1 puff into the lungs 2 (two) times daily. States still taking 03/23/13       No current facility-administered medications for this visit.   REVIEW OF SYSTEMS: Arly.Keller ] denotes positive finding; [  ] denotes negative finding  CARDIOVASCULAR:  [ ]  chest pain   [ ]  chest pressure   [ ]  palpitations   [ ]  orthopnea   [ ]  dyspnea on exertion   [ ]  claudication   [ ]  rest pain   [ ]  DVT   [ ]  phlebitis PULMONARY:   [ ]  productive cough   [ ]  asthma   [ ]  wheezing NEUROLOGIC:   Arly.Keller ] weakness  Arly.Keller ] paresthesias  [ ]  aphasia  [ ]  amaurosis  [ ]  dizziness HEMATOLOGIC:   [ ]  bleeding problems   [ ]  clotting disorders MUSCULOSKELETAL:  [ ]  joint pain   [ ]  joint swelling Arly.Keller ] leg swelling bilateral GASTROINTESTINAL: [ ]   blood in stool  [ ]   hematemesis GENITOURINARY:  [ ]   dysuria  [ ]   hematuria PSYCHIATRIC:  [ ]  history of major depression INTEGUMENTARY:  [ ]  rashes  [ ]  ulcers CONSTITUTIONAL:  [ ]  fever   [ ]  chills  PHYSICAL EXAM: Filed Vitals:   06/28/13 0931  BP:  119/82  Pulse: 77  Resp: 16  Height: 5' 6.5" (1.689 m)  Weight: 242 lb  (109.77 kg)  SpO2: 100%   Body mass index is 38.48 kg/(m^2). GENERAL: The patient is a well-nourished female, in no acute distress. The vital signs are documented above. CARDIOVASCULAR: There is a regular rate and rhythm. I do not detect carotid bruits. She has palpable dorsalis pedis pulses bilaterally. She has mild bilateral lower extremity edema. PULMONARY: There is good air exchange bilaterally without wheezing or rales. ABDOMEN: Soft and non-tender with normal pitched bowel sounds.  MUSCULOSKELETAL: There are no major deformities or cyanosis. NEUROLOGIC: No focal weakness or paresthesias are detected. SKIN: There are no ulcers or rashes noted. PSYCHIATRIC: The patient has a normal affect.  DATA:  I have independently interpreted her formal venous duplex study in our office which shows no evidence of significant deep or superficial reflux in either lower extremity. There is no evidence of DVT in either lower extremity.  MEDICAL ISSUES: This patient has bilateral lower extremity swelling. Based on her workup, she has no evidence of DVT and no evidence of significant chronic venous insufficiency. Her exam does not suggest classic lymphedema although she could have some mild lymphedema. I reassured her that she has excellent arterial flow. We have discussed the treatment of leg swelling including the need for daily leg elevation. We have discussed the proper positioning for this. In addition I have written her a prescription for knee-high compression stockings with a gradient of 15-20 mm of mercury. We also discussed potentially starting water aerobics which is helpful. In addition I have encouraged her to avoid prolonged sitting and standing and encouraged her to ambulate as much as possible. All be happy to see her back at any time if any new vascular issues arise.  DICKSON,CHRISTOPHER S [facility] [place] [facility]: [phone]

## 2013-08-24 ENCOUNTER — Other Ambulatory Visit: Payer: Self-pay

## 2013-12-06 ENCOUNTER — Encounter (INDEPENDENT_AMBULATORY_CARE_PROVIDER_SITE_OTHER): Payer: Self-pay | Admitting: General Surgery

## 2014-02-28 ENCOUNTER — Other Ambulatory Visit: Payer: Self-pay | Admitting: Neurological Surgery

## 2014-03-17 ENCOUNTER — Emergency Department (HOSPITAL_COMMUNITY)
Admission: EM | Admit: 2014-03-17 | Discharge: 2014-03-17 | Disposition: A | Discharge status: Home / Self Care | Payer: Managed Care, Other (non HMO) | Attending: Emergency Medicine | Admitting: Emergency Medicine

## 2014-03-17 ENCOUNTER — Emergency Department (HOSPITAL_COMMUNITY): Payer: Managed Care, Other (non HMO)

## 2014-03-17 ENCOUNTER — Encounter (HOSPITAL_COMMUNITY): Payer: Self-pay | Admitting: Emergency Medicine

## 2014-03-17 DIAGNOSIS — Z872 Personal history of diseases of the skin and subcutaneous tissue: Secondary | ICD-10-CM | POA: Insufficient documentation

## 2014-03-17 DIAGNOSIS — F329 Major depressive disorder, single episode, unspecified: Secondary | ICD-10-CM | POA: Insufficient documentation

## 2014-03-17 DIAGNOSIS — K219 Gastro-esophageal reflux disease without esophagitis: Secondary | ICD-10-CM | POA: Insufficient documentation

## 2014-03-17 DIAGNOSIS — R059 Cough, unspecified: Secondary | ICD-10-CM

## 2014-03-17 DIAGNOSIS — Z880 Allergy status to penicillin: Secondary | ICD-10-CM | POA: Insufficient documentation

## 2014-03-17 DIAGNOSIS — J45909 Unspecified asthma, uncomplicated: Secondary | ICD-10-CM | POA: Insufficient documentation

## 2014-03-17 DIAGNOSIS — E669 Obesity, unspecified: Secondary | ICD-10-CM | POA: Insufficient documentation

## 2014-03-17 DIAGNOSIS — IMO0002 Reserved for concepts with insufficient information to code with codable children: Secondary | ICD-10-CM | POA: Insufficient documentation

## 2014-03-17 DIAGNOSIS — F410 Panic disorder [episodic paroxysmal anxiety] without agoraphobia: Secondary | ICD-10-CM | POA: Insufficient documentation

## 2014-03-17 DIAGNOSIS — Z87891 Personal history of nicotine dependence: Secondary | ICD-10-CM | POA: Insufficient documentation

## 2014-03-17 DIAGNOSIS — Z87442 Personal history of urinary calculi: Secondary | ICD-10-CM | POA: Insufficient documentation

## 2014-03-17 DIAGNOSIS — Z79899 Other long term (current) drug therapy: Secondary | ICD-10-CM | POA: Insufficient documentation

## 2014-03-17 DIAGNOSIS — E119 Type 2 diabetes mellitus without complications: Secondary | ICD-10-CM | POA: Insufficient documentation

## 2014-03-17 DIAGNOSIS — J040 Acute laryngitis: Secondary | ICD-10-CM | POA: Insufficient documentation

## 2014-03-17 DIAGNOSIS — Z9884 Bariatric surgery status: Secondary | ICD-10-CM | POA: Insufficient documentation

## 2014-03-17 DIAGNOSIS — E785 Hyperlipidemia, unspecified: Secondary | ICD-10-CM | POA: Insufficient documentation

## 2014-03-17 DIAGNOSIS — Z862 Personal history of diseases of the blood and blood-forming organs and certain disorders involving the immune mechanism: Secondary | ICD-10-CM | POA: Insufficient documentation

## 2014-03-17 DIAGNOSIS — I10 Essential (primary) hypertension: Secondary | ICD-10-CM | POA: Insufficient documentation

## 2014-03-17 DIAGNOSIS — M549 Dorsalgia, unspecified: Secondary | ICD-10-CM | POA: Insufficient documentation

## 2014-03-17 DIAGNOSIS — Z8669 Personal history of other diseases of the nervous system and sense organs: Secondary | ICD-10-CM | POA: Insufficient documentation

## 2014-03-17 DIAGNOSIS — R05 Cough: Secondary | ICD-10-CM

## 2014-03-17 DIAGNOSIS — Q249 Congenital malformation of heart, unspecified: Secondary | ICD-10-CM | POA: Insufficient documentation

## 2014-03-17 DIAGNOSIS — F3289 Other specified depressive episodes: Secondary | ICD-10-CM | POA: Insufficient documentation

## 2014-03-17 MED ORDER — HYDROCODONE-HOMATROPINE 5-1.5 MG/5ML PO SYRP: 5.0000 mL | ORAL_SOLUTION | Freq: Four times a day (QID) | ORAL | Status: DC | PRN

## 2014-03-17 NOTE — Discharge Instructions (Signed)

## 2014-03-17 NOTE — ED Provider Notes (Signed)
CSN: [id number]     Arrival date & time 03/17/14  1734 History   First MD Initiated Contact with Patient 03/17/14 1753     Chief Complaint  Patient presents with  . Asthma     (Consider location/radiation/quality/duration/timing/severity/associated sxs/prior Treatment) HPI 48 year old female with a history of asthma presents with 2 weeks of dry cough. States the cough is not getting worse but has not improved either. She's tried Mucinex early on with no success. She then talked to a "teledoc" who prescribed a Z-Pak. She finished this up several days ago and has had no relief. She called her primary doctor who prescribed her 40 mg of prednisone for 5 days. Her last dose tomorrow. She's been trying her albuterol and Advair with no success. She's short of breath after prolonged walking and short of breath after prolonged talking. She also feels a mild sore throat and like her voice is raspy. She's not having any trouble speaking otherwise. No trouble breathing while at rest. She has chronic back pain and frequent coughing is making her back pain worse. Otherwise no pain or fevers. No nasal congestion.  Past Medical History  Diagnosis Date  . Unspecified essential hypertension   . Hyperlipidemia   . GERD (gastroesophageal reflux disease)   . 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 Chronic constipation   . Obesity   . Asthma     PFT 08/12/07: FEV1 2.97 (104%), FEV1% 84, TLC 4.89 (90%), DLCO 84%, +BD  . Allergic rhinitis   . Panic attacks   . Hydatidiform mole   . Visual disturbance   . PONV (postoperative nausea and vomiting)   . Anemia   . Blood transfusion     hx of 2002   . 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 Depression   . Psoriasis   . Hernia     LLQ (after kidney removed)  . Diabetes mellitus     pre gastric bypass  . Cough 03/23/13  . Peripheral neuropathy     bilateral feet   Past Surgical History   Procedure Laterality Date  . Nephrectomy  2002    left  . Elbow fracture surgery      left  . Shoulder arthroscopy      right shoulder  . Laparoscopic gastric banding  02/2006    w/ truncal vagotomy  . Patent ductus arterious repair    . Lap band removal     . 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 Lumbar laminectomy/decompression microdiscectomy Left 11/30/2012    Procedure: MICRO LUMBAR DECOMPRESSION L4-5 ON THE LEFT;  Surgeon: Javier DockerJeffrey C Beane, MD;  Location: [facility];  Service: Orthopedics;  Laterality: Left;  MICRO LUMBAR DECOMPRESSION L4-5 ON THE LEFT  . Lumbar laminectomy/decompression microdiscectomy N/A 03/30/2013    Procedure: REDO MICRO LUMBAR DECOMPRESSION L4-5;  Surgeon: Javier DockerJeffrey C Beane, MD;  Location: [facility];  Service: Orthopedics;  Laterality: N/A;   Family History  Problem Relation Age of Onset  . Hypertension Father   . Skin cancer Father   . Cancer Father     melanoma  . Heart disease Father     before age 50  . Heart attack Father   . Hyperlipidemia Father   . Diabetes Mother   .  Hypertension Mother   . Skin cancer Mother   . Cancer Mother     Melanoma,Breast  . Heart disease Mother   . Hyperlipidemia Mother   . Hypertension Sister   . Heart disease Sister     before age 60  . Heart attack Sister   . Hyperlipidemia Sister   . Breast cancer      aunt  . Cancer Maternal Aunt     breast   History  Substance Use Topics  . Smoking status: Former Smoker -- 2.00 packs/day    Types: Cigarettes    Quit date: 10/19/1982  . Smokeless tobacco: Never Used  . Alcohol Use: No   OB History   Grav Para Term Preterm Abortions TAB SAB Ect Mult Living                 Review of Systems  Constitutional: Negative for fever.  HENT: Positive for voice change. Negative for congestion and rhinorrhea.   Respiratory:  Positive for cough and shortness of breath.   Cardiovascular: Negative for chest pain.  Gastrointestinal: Negative for vomiting.  Musculoskeletal: Positive for back pain.  All other systems reviewed and are negative.     Allergies  Penicillins; Sulfonamide derivatives; and Ciprofloxacin  Home Medications   Prior to Admission medications   Medication Sig Start Date End Date Taking? Authorizing Provider  adalimumab (HUMIRA PEN) 40 MG/0.8ML injection Inject 40 mg into the skin every 14 (fourteen) days. Every other week  Last injection on 03/16/13    Historical Provider, MD  amitriptyline (ELAVIL) 25 MG tablet Take 50 mg by mouth at bedtime.     Historical Provider, MD  CALCIUM-VITAMIN D PO Take 1 tablet by mouth daily.    Historical Provider, MD  cetirizine (ZYRTEC) 10 MG tablet Take 10 mg by mouth daily.    Historical Provider, MD  Cyanocobalamin (VITAMIN B12 PO) Take 1 tablet by mouth daily.     Historical Provider, MD  desvenlafaxine (PRISTIQ) 50 MG 24 hr tablet Take 50 mg by mouth every morning.     Historical Provider, MD  dexlansoprazole (DEXILANT) 60 MG capsule Take 60 mg by mouth daily.     Historical Provider, MD  ferrous fumarate (HEMOCYTE - 106 MG FE) 325 (106 FE) MG TABS Take 1 tablet by mouth daily.     Historical Provider, MD  Fluticasone-Salmeterol (ADVAIR) 100-50 MCG/DOSE AEPB Inhale 1 puff into the lungs 2 (two) times daily. States still taking 03/23/13 01/11/12 03/30/13  Coralyn Helling, MD  furosemide (LASIX) 20 MG tablet Take 20 mg by mouth every morning.     Historical Provider, MD  HYDROcodone-homatropine (HYCODAN) 5-1.5 MG/5ML syrup Take 5 mLs by mouth every 6 (six) hours as needed for cough. 03/17/14   Audree Camel, MD  losartan (COZAAR) 50 MG tablet Take 50 mg by mouth every morning.  09/21/11   Historical Provider, MD  methocarbamol (ROBAXIN) 500 MG tablet Take 1 tablet (500 mg total) by mouth 3 (three) times daily between meals as needed. 03/30/13   Javier Docker, MD   montelukast (SINGULAIR) 10 MG tablet Take 10 mg by mouth daily.     Historical Provider, MD  Multiple Vitamins-Minerals (MULTIVITAMIN PO) Take 1 tablet by mouth daily.     Historical Provider, MD  Norethindrone-Ethinyl Estradiol-Fe Biphas (LO LOESTRIN FE) 1 MG-10 MCG / 10 MCG tablet Take 1 tablet by mouth daily. 02/02/13   Historical Provider, MD  oxyCODONE-acetaminophen (PERCOCET) 7.5-325 MG per tablet Take 1-2 tablets by  mouth every 4 (four) hours as needed for pain. 03/30/13   Javier Docker, MD  polyethylene glycol (MIRALAX) powder Take 17 g by mouth daily. Mix 1 capful in 8 ounces of water daily    Historical Provider, MD  PROAIR HFA 108 (90 BASE) MCG/ACT inhaler Inhale 2 puffs into the lungs every 4 (four) hours as needed for wheezing or shortness of breath. WHEEZING 08/26/11   Historical Provider, MD  rosuvastatin (CRESTOR) 10 MG tablet Take 5 mg by mouth every morning.     Historical Provider, MD  verapamil (COVERA HS) 240 MG (CO) 24 hr tablet Take 240 mg by mouth every morning. Take 1 tablet by mouth once daily    Historical Provider, MD   BP 157/87  Pulse 84  Temp(Src) 99.1 F (37.3 C) (Oral)  Resp 20  SpO2 98%  LMP 02/20/2014 Physical Exam  Nursing note and vitals reviewed. Constitutional: She is oriented to person, place, and time. She appears well-developed and well-nourished. No distress.  HENT:  Head: Normocephalic and atraumatic.  Right Ear: External ear normal.  Left Ear: External ear normal.  Nose: Nose normal.  Mouth/Throat: Oropharynx is clear and moist. No oropharyngeal exudate.  Eyes: Right eye exhibits no discharge. Left eye exhibits no discharge.  Cardiovascular: Normal rate, regular rhythm and normal heart sounds.   Pulmonary/Chest: Effort normal and breath sounds normal. She has no wheezes. She has no rales.  Abdominal: Soft. There is no tenderness.  Neurological: She is alert and oriented to person, place, and time.  Skin: Skin is warm and dry.    ED Course   Procedures (including critical care time) Labs Review Labs Reviewed - No data to display  Imaging Review Dg Chest 2 View  03/17/2014   CLINICAL DATA:  03/23/2013  EXAM: CHEST  2 VIEW  COMPARISON:  03/23/2013  FINDINGS: The heart size and mediastinal contours are within normal limits. Both lungs are clear. The visualized skeletal structures are unremarkable.  IMPRESSION: No active cardiopulmonary disease.   Electronically Signed   By: Elige Ko   On: 03/17/2014 18:09     EKG Interpretation None      MDM   Final diagnoses:  Cough  Laryngitis    Patient is well-appearing here and has no signs of pneumonia or acute respiratory distress. No signs of bronchospasm or need for breathing treatment. She likely has a virus causing laryngitis and her cough. It will try her on Hycodan as this is approved by her insurance for cough. Discussed return precautions and she will make followup with her PCP.    Audree Camel, MD 03/17/14 Rickey Primus

## 2014-03-17 NOTE — ED Notes (Signed)
Pt reports "raspy voice," chest congestion, cough x2 weeks. Has been on Z-pak, breathing tx. Started Prednisone Wednesday. C/o back pain r/t cough.

## 2014-05-09 ENCOUNTER — Encounter (HOSPITAL_COMMUNITY): Payer: Self-pay | Admitting: Pharmacy Technician

## 2014-05-10 NOTE — Pre-Procedure Instructions (Signed)
Dominique MaidensSusan Evans  05/10/2014   Your procedure is scheduled on:  05/23/2014  [facility] 9:15 AM.  Call this number if you have problems the morning of surgery: [phone]   Remember:   Do not eat food or drink liquids after midnight.  On Wednesday p.m.   Take these medicines the morning of surgery with A SIP OF WATER: PRISTIQ, DEXILANT, ADVAIR inhaler , Neurontin, pain medicine if needed    Do not wear jewelry, make-up or nail polish.  Do not wear lotions, powders, or perfumes. You may wear deodorant.  Do not shave 48 hours prior to surgery. Men may shave face and neck.  Do not bring valuables to the hospital.  [facility] not [facility]    for any belongings or valuables.               Contacts, dentures or bridgework may not be worn into surgery.  Leave suitcase in the car. After surgery it may be brought to your room.  For patients admitted to the hospital, discharge time is determined by your                treatment team.               Patients discharged the day of surgery will not be allowed to drive  home.  Name and phone number of your driver:/ w SON -   Special Instructions: Special Instructions: [facility] - Preparing for Surgery  Before surgery, you can play an important role.  Because skin is not sterile, your skin needs to be as free of germs as possible.  You can reduce the number of germs on you skin by washing with CHG (chlorahexidine gluconate) soap before surgery.  CHG is an antiseptic cleaner which kills germs and bonds with the skin to continue killing germs even after washing.  Please DO NOT use if you have an allergy to CHG or antibacterial soaps.  If your skin becomes reddened/irritated stop using the CHG and inform your nurse when you arrive at Short Stay.  Do not shave (including legs and underarms) for at least 48 hours prior to the first CHG shower.  You may shave your face.  Please follow these instructions  carefully:   1.  Shower with CHG Soap the night before surgery and the  morning of Surgery.  2.  If you choose to wash your hair, wash your hair first as usual with your  normal shampoo.  3.  After you shampoo, rinse your hair and body thoroughly to remove the  Shampoo.  4.  Use CHG as you would any other liquid soap.  You can apply chg directly to the skin and wash gently with scrungie or a clean washcloth.  5.  Apply the CHG Soap to your body ONLY FROM THE NECK DOWN.    Do not use on open wounds or open sores.  Avoid contact with your eyes, ears, mouth and genitals (private parts).  Wash genitals (private parts)   with your normal soap.  6.  Wash thoroughly, paying special attention to the area where your surgery will be performed.  7.  Thoroughly rinse your body with warm water from the neck down.  8.  DO NOT shower/wash with your normal soap after using and rinsing off   the CHG Soap.  9.  Pat yourself dry with a clean towel.  10.  Wear clean pajamas.            11.  Place clean sheets on your bed the night of your first shower and do not sleep with pets.  Day of Surgery  Do not apply any lotions/deodorants the morning of surgery.  Please wear clean clothes to the hospital/surgery center.  Please read over the following fact sheets that you were given: Pain Booklet, Coughing and Deep Breathing, Blood Transfusion Information, MRSA Information and Surgical Site Infection Prevention

## 2014-05-11 ENCOUNTER — Encounter (HOSPITAL_COMMUNITY): Payer: Self-pay

## 2014-05-11 ENCOUNTER — Encounter (HOSPITAL_COMMUNITY)
Admission: RE | Admit: 2014-05-11 | Discharge: 2014-05-11 | Disposition: A | Discharge status: Home / Self Care | Payer: Managed Care, Other (non HMO) | Source: Ambulatory Visit | Attending: Neurological Surgery | Admitting: Neurological Surgery

## 2014-05-11 DIAGNOSIS — Z01810 Encounter for preprocedural cardiovascular examination: Secondary | ICD-10-CM | POA: Insufficient documentation

## 2014-05-11 DIAGNOSIS — Z01812 Encounter for preprocedural laboratory examination: Secondary | ICD-10-CM | POA: Insufficient documentation

## 2014-05-11 HISTORY — DX: Unspecified osteoarthritis, unspecified site: M19.90

## 2014-05-11 HISTORY — DX: Personal history of other diseases of the digestive system: Z87.19

## 2014-05-11 HISTORY — DX: Personal history of other medical treatment: Z92.89

## 2014-05-11 LAB — BASIC METABOLIC PANEL
ANION GAP: 16 — AB (ref 5–15)
BUN: 14 mg/dL (ref 6–23)
CALCIUM: 9.5 mg/dL (ref 8.4–10.5)
CO2: 26 mEq/L (ref 19–32)
Chloride: 99 mEq/L (ref 96–112)
Creatinine, Ser: 0.85 mg/dL (ref 0.50–1.10)
GFR calc Af Amer: >90 mL/min (ref >90)
GFR calc non Af Amer: 79 mL/min — ABNORMAL LOW (ref >90)
Glucose, Bld: 121 mg/dL — ABNORMAL HIGH (ref 70–99)
Potassium: 4.1 mEq/L (ref 3.7–5.3)
SODIUM: 141 meq/L (ref 137–147)

## 2014-05-11 LAB — CBC WITH DIFFERENTIAL/PLATELET
BASOS PCT: 0 % (ref 0–1)
Basophils Absolute: 0 10*3/uL (ref 0.0–0.1)
Eosinophils Absolute: 0.1 10*3/uL (ref 0.0–0.7)
Eosinophils Relative: 1 % (ref 0–5)
HCT: 38.3 % (ref 36.0–46.0)
Hemoglobin: 12.4 g/dL (ref 12.0–15.0)
LYMPHS PCT: 35 % (ref 12–46)
Lymphs Abs: 2.9 10*3/uL (ref 0.7–4.0)
MCH: 27.7 pg (ref 26.0–34.0)
MCHC: 32.4 g/dL (ref 30.0–36.0)
MCV: 85.7 fL (ref 78.0–100.0)
Monocytes Absolute: 0.4 10*3/uL (ref 0.1–1.0)
Monocytes Relative: 5 % (ref 3–12)
Neutro Abs: 4.9 10*3/uL (ref 1.7–7.7)
Neutrophils Relative %: 59 % (ref 43–77)
Platelets: 256 10*3/uL (ref 150–400)
RBC: 4.47 MIL/uL (ref 3.87–5.11)
RDW: 13.3 % (ref 11.5–15.5)
WBC: 8.3 10*3/uL (ref 4.0–10.5)

## 2014-05-11 LAB — PROTIME-INR
INR: 0.97 (ref 0.00–1.49)
PROTHROMBIN TIME: 12.9 s (ref 11.6–15.2)

## 2014-05-11 LAB — SURGICAL PCR SCREEN
MRSA, PCR: NEGATIVE
Staphylococcus aureus: NEGATIVE

## 2014-05-11 LAB — HCG, SERUM, QUALITATIVE: PREG SERUM: NEGATIVE

## 2014-05-22 MED ORDER — VANCOMYCIN HCL 10 G IV SOLR
1500.0000 mg | INTRAVENOUS | Status: AC
  Administered 2014-05-23: 1500 mg via INTRAVENOUS
  Filled 2014-05-22: qty 1500

## 2014-05-22 MED ORDER — DEXAMETHASONE SODIUM PHOSPHATE 10 MG/ML IJ SOLN
10.0000 mg | INTRAMUSCULAR | Status: AC
  Administered 2014-05-23: 10 mg via INTRAVENOUS

## 2014-05-23 ENCOUNTER — Encounter (HOSPITAL_COMMUNITY): Payer: Managed Care, Other (non HMO) | Admitting: Anesthesiology

## 2014-05-23 ENCOUNTER — Encounter (HOSPITAL_COMMUNITY): Payer: Self-pay | Admitting: *Deleted

## 2014-05-23 ENCOUNTER — Inpatient Hospital Stay (HOSPITAL_COMMUNITY): Payer: Managed Care, Other (non HMO) | Admitting: Anesthesiology

## 2014-05-23 ENCOUNTER — Inpatient Hospital Stay (HOSPITAL_COMMUNITY): Payer: Managed Care, Other (non HMO)

## 2014-05-23 ENCOUNTER — Encounter (HOSPITAL_COMMUNITY)
Admission: RE | Disposition: A | Discharge status: Home / Self Care | Payer: Self-pay | Source: Ambulatory Visit | Attending: Neurological Surgery

## 2014-05-23 ENCOUNTER — Inpatient Hospital Stay (HOSPITAL_COMMUNITY)
Admission: RE | Admit: 2014-05-23 | Discharge: 2014-05-25 | DRG: 460 | Disposition: A | Discharge status: Home / Self Care | Payer: Managed Care, Other (non HMO) | Source: Ambulatory Visit | Attending: Neurological Surgery | Admitting: Neurological Surgery

## 2014-05-23 DIAGNOSIS — K219 Gastro-esophageal reflux disease without esophagitis: Secondary | ICD-10-CM | POA: Diagnosis present

## 2014-05-23 DIAGNOSIS — J45909 Unspecified asthma, uncomplicated: Secondary | ICD-10-CM | POA: Diagnosis present

## 2014-05-23 DIAGNOSIS — I10 Essential (primary) hypertension: Secondary | ICD-10-CM | POA: Diagnosis present

## 2014-05-23 DIAGNOSIS — Z87891 Personal history of nicotine dependence: Secondary | ICD-10-CM

## 2014-05-23 DIAGNOSIS — M5126 Other intervertebral disc displacement, lumbar region: Principal | ICD-10-CM | POA: Diagnosis present

## 2014-05-23 DIAGNOSIS — Z981 Arthrodesis status: Secondary | ICD-10-CM

## 2014-05-23 DIAGNOSIS — E785 Hyperlipidemia, unspecified: Secondary | ICD-10-CM | POA: Diagnosis present

## 2014-05-23 DIAGNOSIS — K5900 Constipation, unspecified: Secondary | ICD-10-CM | POA: Diagnosis present

## 2014-05-23 HISTORY — PX: MAXIMUM ACCESS (MAS)POSTERIOR LUMBAR INTERBODY FUSION (PLIF) 1 LEVEL: SHX6368

## 2014-05-23 LAB — TYPE AND SCREEN
ABO/RH(D): O POS
Antibody Screen: NEGATIVE

## 2014-05-23 LAB — GLUCOSE, CAPILLARY
GLUCOSE-CAPILLARY: 188 mg/dL — AB (ref 70–99)
Glucose-Capillary: 115 mg/dL — ABNORMAL HIGH (ref 70–99)
Glucose-Capillary: 156 mg/dL — ABNORMAL HIGH (ref 70–99)
Glucose-Capillary: 171 mg/dL — ABNORMAL HIGH (ref 70–99)

## 2014-05-23 SURGERY — FOR MAXIMUM ACCESS (MAS) POSTERIOR LUMBAR INTERBODY FUSION (PLIF) 1 LEVEL
Anesthesia: General | Site: Back

## 2014-05-23 MED ORDER — MORPHINE SULFATE 2 MG/ML IJ SOLN
1.0000 mg | INTRAMUSCULAR | Status: DC | PRN
  Administered 2014-05-23 (×2): 2 mg via INTRAVENOUS
  Filled 2014-05-23 (×2): qty 1

## 2014-05-23 MED ORDER — MONTELUKAST SODIUM 10 MG PO TABS
10.0000 mg | ORAL_TABLET | Freq: Every day | ORAL | Status: DC
  Administered 2014-05-23 – 2014-05-25 (×3): 10 mg via ORAL
  Filled 2014-05-23 (×3): qty 1

## 2014-05-23 MED ORDER — PROPOFOL 10 MG/ML IV BOLUS
INTRAVENOUS | Status: DC | PRN
  Administered 2014-05-23: 200 mg via INTRAVENOUS
  Administered 2014-05-23: 50 mg via INTRAVENOUS

## 2014-05-23 MED ORDER — ONDANSETRON HCL 4 MG/2ML IJ SOLN
4.0000 mg | INTRAMUSCULAR | Status: DC | PRN
  Administered 2014-05-25: 4 mg via INTRAVENOUS
  Filled 2014-05-23: qty 2

## 2014-05-23 MED ORDER — MIDAZOLAM HCL 2 MG/2ML IJ SOLN
INTRAMUSCULAR | Status: AC
  Filled 2014-05-23: qty 2

## 2014-05-23 MED ORDER — MIDAZOLAM HCL 5 MG/5ML IJ SOLN
INTRAMUSCULAR | Status: DC | PRN
  Administered 2014-05-23: 2 mg via INTRAVENOUS

## 2014-05-23 MED ORDER — SODIUM CHLORIDE 0.9 % IJ SOLN
3.0000 mL | Freq: Two times a day (BID) | INTRAMUSCULAR | Status: DC
  Administered 2014-05-24: 3 mL via INTRAVENOUS

## 2014-05-23 MED ORDER — LACTATED RINGERS IV SOLN
INTRAVENOUS | Status: DC | PRN
  Administered 2014-05-23 (×2): via INTRAVENOUS

## 2014-05-23 MED ORDER — OXYCODONE HCL 5 MG PO TABS
5.0000 mg | ORAL_TABLET | Freq: Once | ORAL | Status: AC | PRN
  Administered 2014-05-23: 5 mg via ORAL

## 2014-05-23 MED ORDER — DEXAMETHASONE SODIUM PHOSPHATE 10 MG/ML IJ SOLN
INTRAMUSCULAR | Status: AC
  Filled 2014-05-23: qty 1

## 2014-05-23 MED ORDER — MENTHOL 3 MG MT LOZG: 1.0000 | LOZENGE | OROMUCOSAL | Status: DC | PRN

## 2014-05-23 MED ORDER — ACETAMINOPHEN 325 MG PO TABS: 650.0000 mg | ORAL_TABLET | ORAL | Status: DC | PRN

## 2014-05-23 MED ORDER — INSULIN ASPART 100 UNIT/ML ~~LOC~~ SOLN
0.0000 [IU] | Freq: Three times a day (TID) | SUBCUTANEOUS | Status: DC
  Administered 2014-05-24: 3 [IU] via SUBCUTANEOUS
  Administered 2014-05-24: 8 [IU] via SUBCUTANEOUS
  Administered 2014-05-24 – 2014-05-25 (×2): 3 [IU] via SUBCUTANEOUS
  Administered 2014-05-25: 5 [IU] via SUBCUTANEOUS

## 2014-05-23 MED ORDER — GABAPENTIN 600 MG PO TABS
600.0000 mg | ORAL_TABLET | Freq: Every day | ORAL | Status: DC
Start: 2014-05-23 — End: 2014-05-25
  Administered 2014-05-23 – 2014-05-24 (×2): 600 mg via ORAL
  Filled 2014-05-23 (×2): qty 1

## 2014-05-23 MED ORDER — PROPOFOL 10 MG/ML IV BOLUS
INTRAVENOUS | Status: AC
  Filled 2014-05-23: qty 20

## 2014-05-23 MED ORDER — METHOCARBAMOL 500 MG PO TABS
ORAL_TABLET | ORAL | Status: AC
Start: 2014-05-23 — End: 2014-05-24
  Filled 2014-05-23: qty 1

## 2014-05-23 MED ORDER — FERROUS SULFATE 325 (65 FE) MG PO TABS
325.0000 mg | ORAL_TABLET | Freq: Every day | ORAL | Status: DC
  Administered 2014-05-24 – 2014-05-25 (×2): 325 mg via ORAL
  Filled 2014-05-23 (×2): qty 1

## 2014-05-23 MED ORDER — SODIUM CHLORIDE 0.9 % IR SOLN
Status: DC | PRN
  Administered 2014-05-23: 13:00:00

## 2014-05-23 MED ORDER — AMITRIPTYLINE HCL 25 MG PO TABS
50.0000 mg | ORAL_TABLET | Freq: Every day | ORAL | Status: DC
  Administered 2014-05-23 – 2014-05-24 (×2): 50 mg via ORAL
  Filled 2014-05-23 (×2): qty 2

## 2014-05-23 MED ORDER — THROMBIN 20000 UNITS EX KIT
PACK | CUTANEOUS | Status: DC | PRN
  Administered 2014-05-23: 20000 [IU] via TOPICAL

## 2014-05-23 MED ORDER — OXYCODONE HCL 5 MG/5ML PO SOLN: 5.0000 mg | Freq: Once | ORAL | Status: AC | PRN

## 2014-05-23 MED ORDER — PANTOPRAZOLE SODIUM 40 MG PO TBEC
40.0000 mg | DELAYED_RELEASE_TABLET | Freq: Every day | ORAL | Status: DC
  Administered 2014-05-24 – 2014-05-25 (×2): 40 mg via ORAL
  Filled 2014-05-23 (×2): qty 1

## 2014-05-23 MED ORDER — ALBUTEROL SULFATE (2.5 MG/3ML) 0.083% IN NEBU: 2.5000 mg | INHALATION_SOLUTION | RESPIRATORY_TRACT | Status: DC | PRN

## 2014-05-23 MED ORDER — GABAPENTIN 300 MG PO CAPS
900.0000 mg | ORAL_CAPSULE | Freq: Every day | ORAL | Status: DC
  Filled 2014-05-23 (×2): qty 3

## 2014-05-23 MED ORDER — PROMETHAZINE HCL 25 MG/ML IJ SOLN: 6.2500 mg | INTRAMUSCULAR | Status: DC | PRN

## 2014-05-23 MED ORDER — FENTANYL CITRATE 0.05 MG/ML IJ SOLN
INTRAMUSCULAR | Status: AC
  Filled 2014-05-23: qty 5

## 2014-05-23 MED ORDER — GELATIN ABSORBABLE MT POWD
OROMUCOSAL | Status: DC | PRN
  Administered 2014-05-23: 13:00:00 via TOPICAL

## 2014-05-23 MED ORDER — HYDROMORPHONE HCL PF 1 MG/ML IJ SOLN
INTRAMUSCULAR | Status: AC
  Filled 2014-05-23: qty 1

## 2014-05-23 MED ORDER — INSULIN ASPART 100 UNIT/ML ~~LOC~~ SOLN
0.0000 [IU] | Freq: Every day | SUBCUTANEOUS | Status: DC
  Administered 2014-05-24: 3 [IU] via SUBCUTANEOUS

## 2014-05-23 MED ORDER — FUROSEMIDE 20 MG PO TABS
20.0000 mg | ORAL_TABLET | Freq: Every morning | ORAL | Status: DC
  Administered 2014-05-24 – 2014-05-25 (×2): 20 mg via ORAL
  Filled 2014-05-23 (×2): qty 1

## 2014-05-23 MED ORDER — DEXTROSE 5 % IV SOLN
500.0000 mg | Freq: Four times a day (QID) | INTRAVENOUS | Status: DC | PRN
  Filled 2014-05-23: qty 5

## 2014-05-23 MED ORDER — VANCOMYCIN HCL IN DEXTROSE 1-5 GM/200ML-% IV SOLN
1000.0000 mg | Freq: Two times a day (BID) | INTRAVENOUS | Status: DC
  Administered 2014-05-23 – 2014-05-25 (×4): 1000 mg via INTRAVENOUS
  Filled 2014-05-23 (×6): qty 200

## 2014-05-23 MED ORDER — PHENYLEPHRINE 40 MCG/ML (10ML) SYRINGE FOR IV PUSH (FOR BLOOD PRESSURE SUPPORT)
PREFILLED_SYRINGE | INTRAVENOUS | Status: AC
  Filled 2014-05-23: qty 10

## 2014-05-23 MED ORDER — POTASSIUM CHLORIDE IN NACL 20-0.9 MEQ/L-% IV SOLN
INTRAVENOUS | Status: DC
  Administered 2014-05-24: 08:00:00 via INTRAVENOUS
  Filled 2014-05-23 (×2): qty 1000

## 2014-05-23 MED ORDER — PROPOFOL INFUSION 10 MG/ML OPTIME
INTRAVENOUS | Status: DC | PRN
  Administered 2014-05-23: 25 ug/kg/min via INTRAVENOUS

## 2014-05-23 MED ORDER — GABAPENTIN 300 MG PO CAPS
300.0000 mg | ORAL_CAPSULE | Freq: Every day | ORAL | Status: DC
  Administered 2014-05-24 – 2014-05-25 (×2): 300 mg via ORAL
  Filled 2014-05-23 (×2): qty 1

## 2014-05-23 MED ORDER — VERAPAMIL HCL 120 MG PO TABS
240.0000 mg | ORAL_TABLET | Freq: Every morning | ORAL | Status: DC
  Administered 2014-05-24 – 2014-05-25 (×2): 240 mg via ORAL
  Filled 2014-05-23 (×2): qty 2

## 2014-05-23 MED ORDER — LIDOCAINE HCL (CARDIAC) 20 MG/ML IV SOLN
INTRAVENOUS | Status: DC | PRN
  Administered 2014-05-23: 40 mg via INTRAVENOUS
  Administered 2014-05-23: 60 mg via INTRAVENOUS

## 2014-05-23 MED ORDER — HYDROMORPHONE HCL PF 1 MG/ML IJ SOLN
0.2500 mg | INTRAMUSCULAR | Status: DC | PRN
  Administered 2014-05-23 (×3): 0.5 mg via INTRAVENOUS

## 2014-05-23 MED ORDER — ACETAMINOPHEN 650 MG RE SUPP: 650.0000 mg | RECTAL | Status: DC | PRN

## 2014-05-23 MED ORDER — HEMOSTATIC AGENTS (NO CHARGE) OPTIME
TOPICAL | Status: DC | PRN
  Administered 2014-05-23: 1 via TOPICAL

## 2014-05-23 MED ORDER — VENLAFAXINE HCL ER 37.5 MG PO CP24
37.5000 mg | ORAL_CAPSULE | Freq: Every day | ORAL | Status: DC
  Administered 2014-05-24 – 2014-05-25 (×2): 37.5 mg via ORAL
  Filled 2014-05-23 (×2): qty 1

## 2014-05-23 MED ORDER — MIDAZOLAM HCL 2 MG/2ML IJ SOLN: 0.5000 mg | Freq: Once | INTRAMUSCULAR | Status: DC | PRN

## 2014-05-23 MED ORDER — ONDANSETRON HCL 4 MG/2ML IJ SOLN
INTRAMUSCULAR | Status: DC | PRN
  Administered 2014-05-23: 4 mg via INTRAVENOUS

## 2014-05-23 MED ORDER — MEPERIDINE HCL 25 MG/ML IJ SOLN: 6.2500 mg | INTRAMUSCULAR | Status: DC | PRN

## 2014-05-23 MED ORDER — LOSARTAN POTASSIUM 50 MG PO TABS
50.0000 mg | ORAL_TABLET | Freq: Every morning | ORAL | Status: DC
  Administered 2014-05-24 – 2014-05-25 (×2): 50 mg via ORAL
  Filled 2014-05-23 (×2): qty 1

## 2014-05-23 MED ORDER — SCOPOLAMINE 1 MG/3DAYS TD PT72
MEDICATED_PATCH | TRANSDERMAL | Status: AC
  Filled 2014-05-23: qty 1

## 2014-05-23 MED ORDER — SCOPOLAMINE 1 MG/3DAYS TD PT72
MEDICATED_PATCH | TRANSDERMAL | Status: DC | PRN
  Administered 2014-05-23: 1 via TRANSDERMAL

## 2014-05-23 MED ORDER — PHENOL 1.4 % MT LIQD: 1.0000 | OROMUCOSAL | Status: DC | PRN

## 2014-05-23 MED ORDER — SODIUM CHLORIDE 0.9 % IV SOLN: 250.0000 mL | INTRAVENOUS | Status: DC

## 2014-05-23 MED ORDER — METHOCARBAMOL 500 MG PO TABS
500.0000 mg | ORAL_TABLET | Freq: Four times a day (QID) | ORAL | Status: DC | PRN
  Administered 2014-05-23 – 2014-05-25 (×4): 500 mg via ORAL
  Filled 2014-05-23 (×5): qty 1

## 2014-05-23 MED ORDER — DIPHENHYDRAMINE HCL 50 MG/ML IJ SOLN
INTRAMUSCULAR | Status: DC | PRN
  Administered 2014-05-23: 12.5 mg via INTRAVENOUS

## 2014-05-23 MED ORDER — DEXAMETHASONE SODIUM PHOSPHATE 4 MG/ML IJ SOLN: 4.0000 mg | Freq: Four times a day (QID) | INTRAMUSCULAR | Status: DC

## 2014-05-23 MED ORDER — BUPIVACAINE HCL (PF) 0.25 % IJ SOLN
INTRAMUSCULAR | Status: DC | PRN
  Administered 2014-05-23: 6 mL

## 2014-05-23 MED ORDER — MOMETASONE FURO-FORMOTEROL FUM 100-5 MCG/ACT IN AERO
2.0000 | INHALATION_SPRAY | Freq: Two times a day (BID) | RESPIRATORY_TRACT | Status: DC
  Administered 2014-05-23 – 2014-05-25 (×3): 2 via RESPIRATORY_TRACT
  Filled 2014-05-23: qty 8.8

## 2014-05-23 MED ORDER — HYDROMORPHONE HCL PF 1 MG/ML IJ SOLN
INTRAMUSCULAR | Status: AC
Start: 2014-05-23 — End: 2014-05-24
  Filled 2014-05-23: qty 1

## 2014-05-23 MED ORDER — OXYCODONE-ACETAMINOPHEN 5-325 MG PO TABS
1.0000 | ORAL_TABLET | ORAL | Status: DC | PRN
  Administered 2014-05-23 – 2014-05-25 (×8): 2 via ORAL
  Filled 2014-05-23 (×9): qty 2

## 2014-05-23 MED ORDER — DEXAMETHASONE 4 MG PO TABS
4.0000 mg | ORAL_TABLET | Freq: Four times a day (QID) | ORAL | Status: DC
  Administered 2014-05-23 – 2014-05-25 (×8): 4 mg via ORAL
  Filled 2014-05-23 (×8): qty 1

## 2014-05-23 MED ORDER — SUCCINYLCHOLINE CHLORIDE 20 MG/ML IJ SOLN
INTRAMUSCULAR | Status: DC | PRN
  Administered 2014-05-23: 100 mg via INTRAVENOUS

## 2014-05-23 MED ORDER — OXYCODONE HCL 5 MG PO TABS
ORAL_TABLET | ORAL | Status: AC
  Filled 2014-05-23: qty 1

## 2014-05-23 MED ORDER — SODIUM CHLORIDE 0.9 % IJ SOLN: 3.0000 mL | INTRAMUSCULAR | Status: DC | PRN

## 2014-05-23 MED ORDER — FENTANYL CITRATE 0.05 MG/ML IJ SOLN
INTRAMUSCULAR | Status: DC | PRN
  Administered 2014-05-23: 200 ug via INTRAVENOUS
  Administered 2014-05-23 (×2): 50 ug via INTRAVENOUS
  Administered 2014-05-23: 100 ug via INTRAVENOUS
  Administered 2014-05-23 (×2): 50 ug via INTRAVENOUS

## 2014-05-23 MED ORDER — ALBUTEROL SULFATE HFA 108 (90 BASE) MCG/ACT IN AERS: 2.0000 | INHALATION_SPRAY | RESPIRATORY_TRACT | Status: DC | PRN

## 2014-05-23 SURGICAL SUPPLY — 72 items
APL SKNCLS STERI-STRIP NONHPOA (GAUZE/BANDAGES/DRESSINGS) ×1
BAG DECANTER FOR FLEXI CONT (MISCELLANEOUS) ×2 IMPLANT
BENZOIN TINCTURE PRP APPL 2/3 (GAUZE/BANDAGES/DRESSINGS) ×2 IMPLANT
BLADE SURG ROTATE 9660 (MISCELLANEOUS) IMPLANT
BUR MATCHSTICK NEURO 3.0 LAGG (BURR) ×2 IMPLANT
CAGE MAS PLIF 9X9X23-8 LUMBAR (Cage) ×2 IMPLANT
CANISTER SUCT 3000ML (MISCELLANEOUS) ×2 IMPLANT
CLIP NEUROVISION LG (CLIP) ×1 IMPLANT
CONT SPEC 4OZ CLIKSEAL STRL BL (MISCELLANEOUS) ×4 IMPLANT
COVER BACK TABLE 24X17X13 BIG (DRAPES) IMPLANT
COVER TABLE BACK 60X90 (DRAPES) ×2 IMPLANT
DRAPE C-ARM 42X72 X-RAY (DRAPES) ×2 IMPLANT
DRAPE C-ARMOR (DRAPES) ×2 IMPLANT
DRAPE LAPAROTOMY 100X72X124 (DRAPES) ×2 IMPLANT
DRAPE POUCH INSTRU U-SHP 10X18 (DRAPES) ×2 IMPLANT
DRAPE SURG 17X23 STRL (DRAPES) ×2 IMPLANT
DRSG OPSITE 4X5.5 SM (GAUZE/BANDAGES/DRESSINGS) ×3 IMPLANT
DRSG OPSITE POSTOP 4X6 (GAUZE/BANDAGES/DRESSINGS) ×1 IMPLANT
DRSG TELFA 3X8 NADH (GAUZE/BANDAGES/DRESSINGS) ×2 IMPLANT
DURAPREP 26ML APPLICATOR (WOUND CARE) ×2 IMPLANT
ELECT REM PT RETURN 9FT ADLT (ELECTROSURGICAL) ×2
ELECTRODE REM PT RTRN 9FT ADLT (ELECTROSURGICAL) ×1 IMPLANT
EVACUATOR 1/8 PVC DRAIN (DRAIN) ×2 IMPLANT
GAUZE SPONGE 4X4 16PLY XRAY LF (GAUZE/BANDAGES/DRESSINGS) IMPLANT
GLOVE BIO SURGEON STRL SZ 6.5 (GLOVE) ×1 IMPLANT
GLOVE BIO SURGEON STRL SZ8 (GLOVE) ×5 IMPLANT
GLOVE BIOGEL PI IND STRL 6.5 (GLOVE) IMPLANT
GLOVE BIOGEL PI IND STRL 7.5 (GLOVE) IMPLANT
GLOVE BIOGEL PI INDICATOR 6.5 (GLOVE) ×1
GLOVE BIOGEL PI INDICATOR 7.5 (GLOVE) ×1
GLOVE INDICATOR 8.5 STRL (GLOVE) ×1 IMPLANT
GLOVE SURG SS PI 7.0 STRL IVOR (GLOVE) ×2 IMPLANT
GOWN STRL REUS W/ TWL LRG LVL3 (GOWN DISPOSABLE) IMPLANT
GOWN STRL REUS W/ TWL XL LVL3 (GOWN DISPOSABLE) ×2 IMPLANT
GOWN STRL REUS W/TWL 2XL LVL3 (GOWN DISPOSABLE) IMPLANT
GOWN STRL REUS W/TWL LRG LVL3 (GOWN DISPOSABLE) ×4
GOWN STRL REUS W/TWL XL LVL3 (GOWN DISPOSABLE) ×4
HEMOSTAT POWDER KIT SURGIFOAM (HEMOSTASIS) ×1 IMPLANT
KIT BASIN OR (CUSTOM PROCEDURE TRAY) ×2 IMPLANT
KIT NDL NVM5 EMG ELECT (KITS) IMPLANT
KIT NEEDLE NVM5 EMG ELECT (KITS) ×1 IMPLANT
KIT NEEDLE NVM5 EMG ELECTRODE (KITS) ×1
KIT ROOM TURNOVER OR (KITS) ×2 IMPLANT
MILL MEDIUM DISP (BLADE) ×1 IMPLANT
NDL HYPO 25X1 1.5 SAFETY (NEEDLE) ×1 IMPLANT
NEEDLE HYPO 25X1 1.5 SAFETY (NEEDLE) ×2 IMPLANT
NS IRRIG 1000ML POUR BTL (IV SOLUTION) ×2 IMPLANT
PACK LAMINECTOMY NEURO (CUSTOM PROCEDURE TRAY) ×2 IMPLANT
PAD ARMBOARD 7.5X6 YLW CONV (MISCELLANEOUS) ×6 IMPLANT
PAD DRESSING TELFA 3X8 NADH (GAUZE/BANDAGES/DRESSINGS) ×1 IMPLANT
PUTTY STIMUBLAST 2.5CC (Putty) ×1 IMPLANT
ROD 35MM (Rod) ×2 IMPLANT
SCREW LOCK (Screw) ×8 IMPLANT
SCREW LOCK FXNS SPNE MAS PL (Screw) IMPLANT
SCREW MAS PLIF 5.5X30 (Screw) ×1 IMPLANT
SCREW PAS PLIF 5X30 (Screw) IMPLANT
SCREW SHANK 5.0X30MM (Screw) ×1 IMPLANT
SCREW SHANK 5.0X35 (Screw) ×2 IMPLANT
SCREW TULIP 5.5 (Screw) ×3 IMPLANT
SPONGE LAP 4X18 X RAY DECT (DISPOSABLE) IMPLANT
SPONGE SURGIFOAM ABS GEL 100 (HEMOSTASIS) ×2 IMPLANT
STRIP CLOSURE SKIN 1/2X4 (GAUZE/BANDAGES/DRESSINGS) ×3 IMPLANT
SUT VIC AB 0 CT1 18XCR BRD8 (SUTURE) ×1 IMPLANT
SUT VIC AB 0 CT1 8-18 (SUTURE) ×2
SUT VIC AB 2-0 CP2 18 (SUTURE) ×2 IMPLANT
SUT VIC AB 3-0 SH 8-18 (SUTURE) ×4 IMPLANT
SYR 20ML ECCENTRIC (SYRINGE) ×2 IMPLANT
SYR 3ML LL SCALE MARK (SYRINGE) IMPLANT
TOWEL OR 17X24 6PK STRL BLUE (TOWEL DISPOSABLE) ×2 IMPLANT
TOWEL OR 17X26 10 PK STRL BLUE (TOWEL DISPOSABLE) ×2 IMPLANT
TRAY FOLEY CATH 14FRSI W/METER (CATHETERS) ×2 IMPLANT
WATER STERILE IRR 1000ML POUR (IV SOLUTION) ×2 IMPLANT

## 2014-05-23 NOTE — Progress Notes (Signed)
ANTIBIOTIC CONSULT NOTE - INITIAL  Pharmacy Consult for vancomycin Indication: surgical prophylaxis  Allergies  Allergen Reactions  . Penicillins Anaphylaxis  . Sulfonamide Derivatives Anaphylaxis  . Ciprofloxacin Hives    Patient Measurements: Height: 5' 6.5" (168.9 cm) Weight: 240 lb 9.6 oz (109.135 kg) IBW/kg (Calculated) : 60.45  Vital Signs: Temp: 98.1 F (36.7 C) (08/05 1729) Temp src: Oral (08/05 0917) BP: 138/69 mmHg (08/05 1729) Pulse Rate: 79 (08/05 1729) Intake/Output from previous day:   Intake/Output from this shift: Total I/O In: 1000 [I.V.:1000] Out: 675 [Urine:475; Blood:200]  Labs: No results found for this basename: WBC, HGB, PLT, LABCREA, CREATININE,  in the last 72 hours Estimated Creatinine Clearance: 101 ml/min (by C-G formula based on Cr of 0.85). No results found for this basename: VANCOTROUGH, Leodis Binet, VANCORANDOM, GENTTROUGH, GENTPEAK, GENTRANDOM, TOBRATROUGH, TOBRAPEAK, TOBRARND, AMIKACINPEAK, AMIKACINTROU, AMIKACIN,  in the last 72 hours   Microbiology: Recent Results (from the past 720 hour(s))  SURGICAL PCR SCREEN     Status: None   Collection Time    05/11/14  9:07 AM      Result Value Ref Range Status   MRSA, PCR NEGATIVE  NEGATIVE Final   Staphylococcus aureus NEGATIVE  NEGATIVE Final   Comment:            The Xpert SA Assay (FDA     approved for NASAL specimens     in patients over 27 years of age),     is one component of     a comprehensive surveillance     program.  Test performance has     been validated by [employer] for patients greater     than or equal to 74 year old.     It is not intended     to diagnose infection nor to     guide or monitor treatment.    Medical History: Past Medical History  Diagnosis Date  . Unspecified essential hypertension   . Hyperlipidemia   . GERD (gastroesophageal reflux disease)   . 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 Chronic  constipation   . Obesity   . Asthma     PFT 08/12/07: FEV1 2.97 (104%), FEV1% 84, TLC 4.89 (90%), DLCO 84%, +BD  . Allergic rhinitis   . Panic attacks   . Hydatidiform mole   . Visual disturbance   . PONV (postoperative nausea and vomiting)   . Anemia   . Blood transfusion     hx of 2002   . 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 Depression   . Psoriasis   . Hernia     LLQ (after kidney removed)  . Diabetes mellitus     pre gastric bypass  . Cough 03/23/13  . Peripheral neuropathy     bilateral feet  . History of stress test     exercise stress test, ? where it was done,  about 6 yrs. ago,  results- negative   . H/O hiatal hernia   . Arthritis     spondylolisthesis- lumbar region    Medications:  Prescriptions prior to admission  Medication Sig Dispense Refill  . adalimumab (HUMIRA PEN) 40 MG/0.8ML injection Inject 40 mg into the skin every 14 (fourteen) days.       Marland Kitchen amitriptyline (ELAVIL) 25 MG tablet Take 50 mg by mouth at bedtime.       Marland Kitchen  Apoaequorin (PREVAGEN) 10 MG CAPS Take 10 mg by mouth daily.      Marland Kitchen. CALCIUM-VITAMIN D PO Take 1 tablet by mouth 2 (two) times daily.       . cyanocobalamin 500 MCG tablet Take 500 mcg by mouth daily.      Marland Kitchen. desvenlafaxine (PRISTIQ) 50 MG 24 hr tablet Take 50 mg by mouth every morning.       Marland Kitchen. dexlansoprazole (DEXILANT) 60 MG capsule Take 60 mg by mouth daily after breakfast.       . ferrous sulfate 325 (65 FE) MG tablet Take 325 mg by mouth daily with breakfast.      . Fluticasone-Salmeterol (ADVAIR) 250-50 MCG/DOSE AEPB Inhale 1 puff into the lungs 2 (two) times daily.      . furosemide (LASIX) 20 MG tablet Take 20 mg by mouth every morning.       . gabapentin (NEURONTIN) 300 MG capsule Take 300-900 mg by mouth 3 (three) times daily. *takes 300mg  in the morning, 900mg  during the day, and 600mg  at bedtime*      . losartan (COZAAR) 50 MG tablet Take 50 mg by mouth every morning.       . montelukast (SINGULAIR)  10 MG tablet Take 10 mg by mouth daily.       . Multiple Vitamins-Minerals (MULTIVITAMIN PO) Take 1 tablet by mouth daily.       . norethindrone-ethinyl estradiol (JUNEL FE,GILDESS FE,LOESTRIN FE) 1-20 MG-MCG tablet Take 1 tablet by mouth at bedtime.       Marland Kitchen. oxyCODONE-acetaminophen (PERCOCET/ROXICET) 5-325 MG per tablet Take 1 tablet by mouth every 6 (six) hours as needed for severe pain.      . polyethylene glycol (MIRALAX) powder Take 17 g by mouth daily.       Marland Kitchen. PROAIR HFA 108 (90 BASE) MCG/ACT inhaler Inhale 2 puffs into the lungs every 4 (four) hours as needed for wheezing or shortness of breath. WHEEZING      . rosuvastatin (CRESTOR) 10 MG tablet Take 5 mg by mouth every morning.       . verapamil (COVERA HS) 240 MG (CO) 24 hr tablet Take 240 mg by mouth every morning.        Assessment: 48 year old woman s/p back surgery today to receive vancomycin post op for surgical prophylaxis.  A drain is in place.  Goal of Therapy:  Vancomycin trough level 10-15 mcg/ml  Plan:  Vancomycin 1g IV q12 Expect short length of therapy  Monitor renal function  Mickeal SkinnerFrens, Braidyn Scorsone John 05/23/2014,5:35 PM

## 2014-05-23 NOTE — Op Note (Signed)
05/23/2014  3:37 PM  PATIENT:  Dominique Evans  48 y.o. 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 Decompressive lumbar laminectomy L4-5 requiring more work than would be required for a simple exposure of the disk for PLIF in order to adequately decompress the neural elements and address the spinal stenosis 2. Posterior lumbar interbody fusion L4-5 using PEEK interbody cages packed with morcellized allograft and autograft 3. Posterior fixation L4-5 using cortical pedicle screws.  4. Intertransverse arthrodesis L4-5 left using morcellized autograft and allograft.  SURGEON:  Marikay Alar, MD  ASSISTANTS: Dr. Wynetta Emery  ANESTHESIA:  General  EBL: 200 ml  Total I/O In: 1000 [I.V.:1000] Out: 550 [Urine:350; Blood:200]  BLOOD ADMINISTERED:none  DRAINS: Hemovac   INDICATION FOR PROCEDURE: This patient presented with severe back and left leg pain. She had had 2 previous microdiscectomy is at L4-5 the left by another surgeon. MRI showed continued foraminal stenosis with instability at L4-5. I recommended a decompression and fusion. Patient understood the risks, benefits, and alternatives and potential outcomes and wished to proceed.  PROCEDURE DETAILS:  The patient was brought to the operating room. After induction of generalized endotracheal anesthesia the patient was rolled into the prone position on chest rolls and all pressure points were padded. The patient's lumbar region was cleaned and then prepped with DuraPrep and draped in the usual sterile fashion. Anesthesia was injected and then a dorsal midline incision was made and carried down to the lumbosacral fascia. The fascia was opened and the paraspinous musculature was taken down in a subperiosteal fashion to expose L4-5. A self-retaining retractor was placed. Intraoperative fluoroscopy confirmed my level, and I  started with placement of the L4 cortical pedicle screws. The pedicle screw entry zones were identified utilizing surface landmarks and  AP and lateral fluoroscopy. I scored the cortex with the high-speed drill and then used the hand drill and EMG monitoring to drill an upward and outward direction into the pedicle. I then tapped line to line, and the tap was also monitored. I then placed a 5-0 x 35 mm cortical pedicle screw into the pedicles of L4 bilaterally. I then turned my attention to the decompression and the spinous process was removed and complete lumbar laminectomies, hemi- facetectomies, and foraminotomies were performed at L4-5. The patient had significant foraminal stenosis on the left with severe epidural fibrosis around the L4 and L5 nerve roots and this required more work than would be required for a simple exposure of the disc for posterior lumbar interbody fusion. Much more generous decompression was undertaken in order to adequately decompress the neural elements and address the patient's leg pain. The yellow ligament was removed to expose the underlying dura and nerve roots, and generous foraminotomies were performed to adequately decompress the neural elements. Both the exiting and traversing nerve roots were decompressed on both sides until a coronary dilator passed easily along the nerve roots. Once the decompression was complete, I turned my attention to the posterior lower lumbar interbody fusion. The epidural venous vasculature was coagulated and cut sharply. Disc space was incised and the initial discectomy was performed with pituitary rongeurs. The disc space was distracted with sequential distractors to a height of 10 mm. We then used a series of scrapers and shavers to prepare the endplates for fusion. The midline was prepared with Epstein curettes. Once the complete discectomy was finished, we packed an  appropriate sized peek interbody cage with local autograft and morcellized  allograft, gently retracted the nerve root, and tapped the cage into position at L4-5.  The midline between the cages was packed with morselized autograft and allograft. We then turned our attention to the placement of the lower pedicle screws. The pedicle screw entry zones were identified utilizing surface landmarks and fluoroscopy. I drilled into each pedicle utilizing the hand drill and EMG monitoring, and tapped each pedicle with the appropriate tap. We palpated with a ball probe to assure no break in the cortex. We then placed 5-0 x 30 mm cortical pedicle screws into the pedicles bilaterally at L5. We then decorticated the transverse processes and laid a mixture of morcellized autograft and allograft out over these to perform intertransverse arthrodesis at L4-5 on the left. We then placed lordotic rods into the multiaxial screw heads of the pedicle screws and locked these in position with the locking caps and anti-torque device. We then checked our construct with AP and lateral fluoroscopy. Irrigated with copious amounts of bacitracin-containing saline solution. Placed a medium Hemovac drain through separate stab incision. Inspected the nerve roots once again to assure adequate decompression, lined to the dura with Gelfoam, and closed the muscle and the fascia with 0 Vicryl. Closed the subcutaneous tissues with 2-0 Vicryl and subcuticular tissues with 3-0 Vicryl. The skin was closed with benzoin and Steri-Strips. Dressing was then applied, the patient was awakened from general anesthesia and transported to the recovery room in stable condition. At the end of the procedure all sponge, needle and instrument counts were correct.   PLAN OF CARE: Admit to inpatient   PATIENT DISPOSITION:  PACU - hemodynamically stable.   Delay start of Pharmacological VTE agent (>24hrs) due to surgical blood loss or risk of bleeding:  yes

## 2014-05-23 NOTE — Anesthesia Procedure Notes (Signed)
Procedure Name: Intubation Date/Time: 05/23/2014 11:53 AM Performed by: Orvilla FusATO, Camarie Mctigue A Pre-anesthesia Checklist: Patient identified, Timeout performed, Emergency Drugs available, Suction available and Patient being monitored Patient Re-evaluated:Patient Re-evaluated prior to inductionOxygen Delivery Method: Circle system utilized Preoxygenation: Pre-oxygenation with 100% oxygen Intubation Type: IV induction Ventilation: Mask ventilation without difficulty Laryngoscope Size: Mac and 3 Grade View: Grade I Tube type: Oral Tube size: 7.0 mm Number of attempts: 1 Airway Equipment and Method: Stylet Placement Confirmation: ETT inserted through vocal cords under direct vision,  breath sounds checked- equal and bilateral and positive ETCO2 Secured at: 21 cm Tube secured with: Tape Dental Injury: Teeth and Oropharynx as per pre-operative assessment

## 2014-05-23 NOTE — H&P (Signed)
Subjective: Patient is a 48 y.o. female admitted for PLIF L4-5. Onset of symptoms was several years ago, gradually worsening since that time.  The pain is rated severe, and is located at the across the lower back and radiates to LLE. She is s/p LLx2 at L4-5 L by another surgeon. The pain is described as aching and burning and occurs all day. The symptoms have been progressive. Symptoms are exacerbated by exercise and standing. MRI or CT showed recurrent disc protrusion L4-5 left with instability. Her pain interferes with her quality of life and activities of daily living.   Past Medical History  Diagnosis Date  . Unspecified essential hypertension   . Hyperlipidemia   . GERD (gastroesophageal reflux disease)   . 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 Chronic constipation   . Obesity   . Asthma     PFT 08/12/07: FEV1 2.97 (104%), FEV1% 84, TLC 4.89 (90%), DLCO 84%, +BD  . Allergic rhinitis   . Panic attacks   . Hydatidiform mole   . Visual disturbance   . PONV (postoperative nausea and vomiting)   . Anemia   . Blood transfusion     hx of 2002   . 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 Depression   . Psoriasis   . Hernia     LLQ (after kidney removed)  . Diabetes mellitus     pre gastric bypass  . Cough 03/23/13  . Peripheral neuropathy     bilateral feet  . History of stress test     exercise stress test, ? where it was done,  about 6 yrs. ago,  results- negative   . H/O hiatal hernia   . Arthritis     spondylolisthesis- lumbar region    Past Surgical History  Procedure Laterality Date  . Nephrectomy  2002    left  . Elbow fracture surgery      left  . Shoulder arthroscopy      right shoulder  . Laparoscopic gastric banding  02/2006    w/ truncal vagotomy  . Patent ductus arterious repair    . Lap band removal     . 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 Lumbar laminectomy/decompression microdiscectomy Left 11/30/2012    Procedure: MICRO LUMBAR DECOMPRESSION L4-5 ON THE LEFT;  Surgeon: Javier DockerJeffrey C Beane, MD;  Location: [facility];  Service: Orthopedics;  Laterality: Left;  MICRO LUMBAR DECOMPRESSION L4-5 ON THE LEFT  . Lumbar laminectomy/decompression microdiscectomy N/A 03/30/2013    Procedure: REDO MICRO LUMBAR DECOMPRESSION L4-5;  Surgeon: Javier DockerJeffrey C Beane, MD;  Location: [facility];  Service: Orthopedics;  Laterality: N/A;    Prior to Admission medications   Medication Sig Start Date End Date Taking? Authorizing Provider  adalimumab (HUMIRA PEN) 40 MG/0.8ML injection Inject 40 mg into the skin every 14 (fourteen) days.    Yes Historical Provider, MD  amitriptyline (ELAVIL) 25 MG tablet Take 50 mg by mouth at bedtime.    Yes Historical Provider, MD  Apoaequorin (PREVAGEN) 10 MG CAPS Take 10 mg by mouth daily.   Yes Historical Provider, MD  CALCIUM-VITAMIN D PO Take 1 tablet by mouth 2 (two) times daily.    Yes Historical Provider, MD  cyanocobalamin 500 MCG tablet Take  500 mcg by mouth daily.   Yes Historical Provider, MD  desvenlafaxine (PRISTIQ) 50 MG 24 hr tablet Take 50 mg by mouth every morning.    Yes Historical Provider, MD  dexlansoprazole (DEXILANT) 60 MG capsule Take 60 mg by mouth daily after breakfast.    Yes Historical Provider, MD  ferrous sulfate 325 (65 FE) MG tablet Take 325 mg by mouth daily with breakfast.   Yes Historical Provider, MD  Fluticasone-Salmeterol (ADVAIR) 250-50 MCG/DOSE AEPB Inhale 1 puff into the lungs 2 (two) times daily.   Yes Historical Provider, MD  furosemide (LASIX) 20 MG tablet Take 20 mg by mouth every morning.    Yes Historical Provider, MD  gabapentin (NEURONTIN) 300 MG capsule Take 300-900 mg by mouth 3 (three) times daily. *takes 300mg  in the morning, 900mg  during the day, and 600mg  at bedtime*   Yes Historical  Provider, MD  losartan (COZAAR) 50 MG tablet Take 50 mg by mouth every morning.  09/21/11  Yes Historical Provider, MD  montelukast (SINGULAIR) 10 MG tablet Take 10 mg by mouth daily.    Yes Historical Provider, MD  Multiple Vitamins-Minerals (MULTIVITAMIN PO) Take 1 tablet by mouth daily.    Yes Historical Provider, MD  norethindrone-ethinyl estradiol (JUNEL FE,GILDESS FE,LOESTRIN FE) 1-20 MG-MCG tablet Take 1 tablet by mouth at bedtime.    Yes Historical Provider, MD  oxyCODONE-acetaminophen (PERCOCET/ROXICET) 5-325 MG per tablet Take 1 tablet by mouth every 6 (six) hours as needed for severe pain.   Yes Historical Provider, MD  polyethylene glycol (MIRALAX) powder Take 17 g by mouth daily.    Yes Historical Provider, MD  PROAIR HFA 108 (90 BASE) MCG/ACT inhaler Inhale 2 puffs into the lungs every 4 (four) hours as needed for wheezing or shortness of breath. WHEEZING 08/26/11  Yes Historical Provider, MD  rosuvastatin (CRESTOR) 10 MG tablet Take 5 mg by mouth every morning.    Yes Historical Provider, MD  verapamil (COVERA HS) 240 MG (CO) 24 hr tablet Take 240 mg by mouth every morning.    Yes Historical Provider, MD   Allergies  Allergen Reactions  . Penicillins Anaphylaxis  . Sulfonamide Derivatives Anaphylaxis  . Ciprofloxacin Hives    History  Substance Use Topics  . Smoking status: Former Smoker -- 2.00 packs/day    Types: Cigarettes    Quit date: 10/19/1982  . Smokeless tobacco: Never Used  . Alcohol Use: No    Family History  Problem Relation Age of Onset  . Hypertension Father   . Skin cancer Father   . Cancer Father     melanoma  . Heart disease Father     before age 47  . Heart attack Father   . Hyperlipidemia Father   . Diabetes Mother   . Hypertension Mother   . Skin cancer Mother   . Cancer Mother     Melanoma,Breast  . Heart disease Mother   . Hyperlipidemia Mother   . Hypertension Sister   . Heart disease Sister     before age 41  . Heart attack Sister   .  Hyperlipidemia Sister   . Breast cancer      aunt  . Cancer Maternal Aunt     breast     Review of Systems  Positive ROS: neg  All other systems have been reviewed and were otherwise negative with the exception of those mentioned in the HPI and as above.  Objective: Vital signs in last 24 hours: Temp:  [97.7 F (36.5  C)] 97.7 F (36.5 C) (08/05 0917) Pulse Rate:  [71] 71 (08/05 0917) Resp:  [18] 18 (08/05 0917) BP: (125)/(67) 125/67 mmHg (08/05 0917) SpO2:  [98 %] 98 % (08/05 0917) Weight:  [109.135 kg (240 lb 9.6 oz)] 109.135 kg (240 lb 9.6 oz) (08/05 0917)  General Appearance: Alert, cooperative, no distress, appears stated age Head: Normocephalic, without obvious abnormality, atraumatic Eyes: PERRL, conjunctiva/corneas clear, EOM's intact    Neck: Supple, symmetrical, trachea midline Back: Symmetric, no curvature, ROM normal, no CVA tenderness Lungs:  respirations unlabored Heart: Regular rate and rhythm Abdomen: Soft, non-tender Extremities: Extremities normal, atraumatic, no cyanosis or edema Pulses: 2+ and symmetric all extremities Skin: Skin color, texture, turgor normal, no rashes or lesions  NEUROLOGIC:   Mental status: Alert and oriented x4,  no aphasia, good attention span, fund of knowledge, and memory Motor Exam - grossly normal Sensory Exam - grossly normal Reflexes: 1+ Coordination - grossly normal Gait - grossly normal Balance - grossly normal Cranial Nerves: I: smell Not tested  II: visual acuity  OS: nl    OD: nl  II: visual fields Full to confrontation  II: pupils Equal, round, reactive to light  III,VII: ptosis None  III,IV,VI: extraocular muscles  Full ROM  V: mastication Normal  V: facial light touch sensation  Normal  V,VII: corneal reflex  Present  VII: facial muscle function - upper  Normal  VII: facial muscle function - lower Normal  VIII: hearing Not tested  IX: soft palate elevation  Normal  IX,X: gag reflex Present  XI:  trapezius strength  5/5  XI: sternocleidomastoid strength 5/5  XI: neck flexion strength  5/5  XII: tongue strength  Normal    Data Review Lab Results  Component Value Date   WBC 8.3 05/11/2014   HGB 12.4 05/11/2014   HCT 38.3 05/11/2014   MCV 85.7 05/11/2014   PLT 256 05/11/2014   Lab Results  Component Value Date   NA 141 05/11/2014   K 4.1 05/11/2014   CL 99 05/11/2014   CO2 26 05/11/2014   BUN 14 05/11/2014   CREATININE 0.85 05/11/2014   GLUCOSE 121* 05/11/2014   Lab Results  Component Value Date   INR 0.97 05/11/2014    Assessment/Plan: Patient admitted for PLIF L4-5 for back and left leg pain after 2 previous left L4-5 microdiscectomy. Patient has failed a reasonable attempt at conservative therapy.  I explained the condition and procedure to the patient and answered any questions.  Patient wishes to proceed with procedure as planned. Understands risks/ benefits and typical outcomes of procedure.   Terrell Shimko S 05/23/2014 11:05 AM

## 2014-05-23 NOTE — Anesthesia Preprocedure Evaluation (Addendum)
Anesthesia Evaluation  Patient identified by MRN, date of birth, ID band Patient awake    Reviewed: Allergy & Precautions, H&P , NPO status , Patient's Chart, lab work & pertinent test results  History of Anesthesia Complications (+) PONV and history of anesthetic complications  Airway Mallampati: I TM Distance: >3 FB Neck ROM: Full    Dental  (+) Teeth Intact, Dental Advisory Given   Pulmonary asthma , sleep apnea , COPD COPD inhaler, former smoker (quit 1984),  breath sounds clear to auscultation        Cardiovascular hypertension, Pt. 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   Neuro/Psych Anxiety Chronic back pain: narcotics     GI/Hepatic Neg liver ROS, GERD-  Medicated and Controlled,Gastric bypass/roux-en-y   Endo/Other  diabetes (glu 115, diet controlled)Morbid obesity  Renal/GU negative Renal ROS     Musculoskeletal   Abdominal (+) + obese,   Peds  Hematology negative hematology ROS (+)   Anesthesia Other Findings   Reproductive/Obstetrics LMP 04/22/14                         Anesthesia Physical Anesthesia Plan  ASA: III  Anesthesia Plan: General   Post-op Pain Management:    Induction: Intravenous  Airway Management Planned: Oral ETT  Additional Equipment:   Intra-op Plan:   Post-operative Plan: Extubation in OR  Informed Consent: I have reviewed the patients History and Physical, chart, labs and discussed the procedure including the risks, benefits and alternatives for the proposed anesthesia with the patient or authorized representative who has indicated his/her understanding and acceptance.   Dental advisory given  Plan Discussed with: CRNA and Surgeon  Anesthesia Plan Comments: (Plan routine monitors, GETA)        Anesthesia Quick Evaluation

## 2014-05-23 NOTE — Progress Notes (Signed)
Pt arrived to 4N02. Pt alert and oriented x4. Pt assessed and states pain is "8 out of 10."  Pain medication given and IV fluids started. Pt oriented to room and unit. Call bell and phone within reach. Bed alarm. Will continue to monitor.

## 2014-05-23 NOTE — Anesthesia Postprocedure Evaluation (Signed)
  Anesthesia Post-op Note  Patient: Dominique Evans  Procedure(s) Performed: Procedure(s) with comments: FOR MAXIMUM ACCESS (MAS) POSTERIOR LUMBAR INTERBODY FUSION (PLIF) Lumbar Four-Five (N/A) - FOR MAXIMUM ACCESS (MAS) POSTERIOR LUMBAR INTERBODY FUSION (PLIF) Lumbar Four-Five  Patient Location: PACU  Anesthesia Type:General  Level of Consciousness: awake, alert , oriented and patient cooperative  Airway and Oxygen Therapy: Patient Spontanous Breathing and Patient connected to nasal cannula oxygen  Post-op Pain: mild  Post-op Assessment: Post-op Vital signs reviewed, Patient's Cardiovascular Status Stable, Respiratory Function Stable, Patent Airway, No signs of Nausea or vomiting and Pain level controlled  Post-op Vital Signs: Reviewed and stable  Last Vitals:  Filed Vitals:   05/23/14 1638  BP:   Pulse: 90  Temp:   Resp: 13    Complications: No apparent anesthesia complications

## 2014-05-23 NOTE — Transfer of Care (Signed)
Immediate Anesthesia Transfer of Care Note  Patient: Dominique MaidensSusan Evans  Procedure(s) Performed: Procedure(s) with comments: FOR MAXIMUM ACCESS (MAS) POSTERIOR LUMBAR INTERBODY FUSION (PLIF) Lumbar Four-Five (N/A) - FOR MAXIMUM ACCESS (MAS) POSTERIOR LUMBAR INTERBODY FUSION (PLIF) Lumbar Four-Five  Patient Location: PACU  Anesthesia Type:General  Level of Consciousness: awake, alert  and oriented  Airway & Oxygen Therapy: Patient Spontanous Breathing and Patient connected to nasal cannula oxygen  Post-op Assessment: Report given to PACU RN, Post -op Vital signs reviewed and stable and Patient moving all extremities X 4  Post vital signs: Reviewed and stable  Complications: No apparent anesthesia complications

## 2014-05-24 ENCOUNTER — Encounter (HOSPITAL_COMMUNITY): Payer: Self-pay | Admitting: Neurological Surgery

## 2014-05-24 LAB — GLUCOSE, CAPILLARY
GLUCOSE-CAPILLARY: 267 mg/dL — AB (ref 70–99)
Glucose-Capillary: 200 mg/dL — ABNORMAL HIGH (ref 70–99)
Glucose-Capillary: 260 mg/dL — ABNORMAL HIGH (ref 70–99)
Glucose-Capillary: 184 mg/dL — ABNORMAL HIGH (ref 70–99)

## 2014-05-24 MED ORDER — CALCIUM CARBONATE ANTACID 500 MG PO CHEW
400.0000 mg | CHEWABLE_TABLET | Freq: Three times a day (TID) | ORAL | Status: DC | PRN
  Filled 2014-05-24 (×2): qty 1

## 2014-05-24 MED ORDER — GABAPENTIN 300 MG PO CAPS
300.0000 mg | ORAL_CAPSULE | ORAL | Status: DC
  Administered 2014-05-24 – 2014-05-25 (×2): 300 mg via ORAL
  Filled 2014-05-24: qty 1

## 2014-05-24 MED ORDER — CALCIUM CARBONATE ANTACID 500 MG PO CHEW
2.0000 | CHEWABLE_TABLET | Freq: Three times a day (TID) | ORAL | Status: DC | PRN
  Administered 2014-05-24: 400 mg via ORAL
  Administered 2014-05-25: 200 mg via ORAL
  Filled 2014-05-24: qty 1

## 2014-05-24 MED ORDER — POLYETHYLENE GLYCOL 3350 17 G PO PACK
17.0000 g | PACK | Freq: Every day | ORAL | Status: DC
  Administered 2014-05-24 – 2014-05-25 (×2): 17 g via ORAL
  Filled 2014-05-24 (×2): qty 1

## 2014-05-24 NOTE — Progress Notes (Signed)
Patient ID: Dominique Evans, female   DOB: 04/13/1966, 48 y.o.   MRN: [id number] Subjective: Patient reports she is doing well. No leg pain. No numbness or tingling. She has already worked with therapy. Appropriate back soreness  Objective: Vital signs in last 24 hours: Temp:  [97.6 F (36.4 C)-98.5 F (36.9 C)] 98.5 F (36.9 C) (08/06 0937) Pulse Rate:  [76-109] 82 (08/06 0937) Resp:  [10-21] 20 (08/06 0937) BP: (125-153)/(58-90) 153/76 mmHg (08/06 0937) SpO2:  [96 %-100 %] 98 % (08/06 0937)  Intake/Output from previous day:  08/05 0701 - 08/06 0700 In: 1000 [I.V.:1000] Out: 2475 [Urine:2275; Blood:200] Intake/Output this shift: Total I/O In: 480 [P.O.:480] Out: 125 [Drains:125]  Neurologic: Grossly normal  Lab Results: Lab Results  Component Value Date   WBC 8.3 05/11/2014   HGB 12.4 05/11/2014   HCT 38.3 05/11/2014   MCV 85.7 05/11/2014   PLT 256 05/11/2014   Lab Results  Component Value Date   INR 0.97 05/11/2014   BMET Lab Results  Component Value Date   NA 141 05/11/2014   K 4.1 05/11/2014   CL 99 05/11/2014   CO2 26 05/11/2014   GLUCOSE 121* 05/11/2014   BUN 14 05/11/2014   CREATININE 0.85 05/11/2014   CALCIUM 9.5 05/11/2014    Studies/Results: Dg Lumbar Spine 2-3 Views  05/23/2014   CLINICAL DATA:  L4-5 PLIF  EXAM: LUMBAR SPINE - 2-3 VIEW; DG C-ARM 61-120 MIN  COMPARISON:  None.  FINDINGS: Two spot films were obtained during PLIF procedure at L4-5. Interbody fusion with posterior fixation is noted.   Electronically Signed   By: Alcide CleverMark  Lukens M.D.   On: 05/23/2014 15:24   Dg C-arm 1-60 Min  05/23/2014   CLINICAL DATA:  L4-5 PLIF  EXAM: LUMBAR SPINE - 2-3 VIEW; DG C-ARM 61-120 MIN  COMPARISON:  None.  FINDINGS: Two spot films were obtained during PLIF procedure at L4-5. Interbody fusion with posterior fixation is noted.   Electronically Signed   By: Alcide CleverMark  Lukens M.D.   On: 05/23/2014 15:24    Assessment/Plan: Patient doing well postop day 1 after PLIF. Wants to go home  tomorrow   LOS: 1 day    Kdyn Vonbehren S 05/24/2014, 1:10 PM

## 2014-05-24 NOTE — Progress Notes (Signed)
Utilization review completed. Magdala Brahmbhatt, RN, BSN. 

## 2014-05-24 NOTE — Evaluation (Signed)
Occupational Therapy Evaluation Patient Details Name: Dominique MaidensSusan Marcom MRN: [id number] DOB: 05/01/1966 Today's Date: 05/24/2014    History of Present Illness 48 yo s/p PLIF L 4-5 with brace   Clinical Impression   Patient evaluated by Occupational Therapy with no further acute OT needs identified. All education has been completed and the patient has no further questions. See below for any follow-up Occupational Therapy or equipment needs. OT to sign off. Thank you for referral.      Follow Up Recommendations  No OT follow up    Equipment Recommendations  None recommended by OT    Recommendations for Other Services       Precautions / Restrictions Precautions Precautions: Back Precaution Comments: handout provided and reviewed      Mobility Bed Mobility               General bed mobility comments: no assess in chair on arrival  Transfers Overall transfer level: Modified independent                    Balance                                            ADL Overall ADL's : Modified independent                                       General ADL Comments: Pt demonstrates toilet transfer, sink levle grooming, don doff brace, don doff gown, don of underwear,. Pt plans to purchase reacher for LB dressing. pt has necessary DME at this time     Vision                     Perception     Praxis      Pertinent Vitals/Pain Pain under control and managed at this time Educated patient on pain management for d/c home     Hand Dominance Right   Extremity/Trunk Assessment Upper Extremity Assessment Upper Extremity Assessment: Overall WFL for tasks assessed   Lower Extremity Assessment Lower Extremity Assessment: Overall WFL for tasks assessed   Cervical / Trunk Assessment Cervical / Trunk Assessment: Normal   Communication Communication Communication: No difficulties   Cognition Arousal/Alertness:  Awake/alert Behavior During Therapy: WFL for tasks assessed/performed Overall Cognitive Status: Within Functional Limits for tasks assessed                     General Comments    All education is complete and patient indicates understanding. Handout reviewed in detail with patient  Discussed pet care for d/c home and safety   Exercises       Shoulder Instructions      Home Living Family/patient expects to be discharged to:: Private residence Living Arrangements: Children Available Help at Discharge: Family Type of Home: Apartment Home Access: Level entry     Home Layout: One level     Bathroom Shower/Tub: Chief Strategy OfficerTub/shower unit   Bathroom Toilet: Standard     Home Equipment: Cane - single point;Grab bars - tub/shower;Walker - 2 wheels   Additional Comments: hsa 3 children older than 16 that can assist      Prior Functioning/Environment Level of Independence: Independent with assistive device(s)        Comments: walks with a  cane    OT Diagnosis:     OT Problem List:     OT Treatment/Interventions:      OT Goals(Current goals can be found in the care plan section)    OT Frequency:     Barriers to D/C:            Co-evaluation              End of Session Equipment Utilized During Treatment: Gait belt;Rolling walker;Back brace Nurse Communication: Mobility status;Precautions  Activity Tolerance: Patient tolerated treatment well Patient left: in chair;with call bell/phone within reach   Time: 1610-9604 OT Time Calculation (min): 33 min Charges:  OT General Charges $OT Visit: 1 Procedure OT Evaluation $Initial OT Evaluation Tier I: 1 Procedure OT Treatments $Self Care/Home Management : 23-37 mins G-Codes:    Harolyn Rutherford 2014/06/13, 9:18 AM Pager: [phone]

## 2014-05-24 NOTE — Evaluation (Signed)
Physical Therapy Evaluation and Discharge  Patient Details Name: Dominique Evans MRN: [id number] DOB: 02/12/1966 Today's Date: 05/24/2014   History of Present Illness  48 yo s/p PLIF L 4-5 with brace  Clinical Impression  Pt s/p surgery listed above. Pt at mod i to supervision for all mobility. Was able to recall 3/3 back precautions and demo good technique with all mobility. Encouraged pt to ambulate unit as tolerated. No further acute PT needs warranted at this time. Will sign off. Thanks for the referral.     Follow Up Recommendations No PT follow up;Supervision - Intermittent    Equipment Recommendations  None recommended by PT    Recommendations for Other Services       Precautions / Restrictions Precautions Precautions: Back Precaution Booklet Issued: Yes (comment) Precaution Comments: pt able to recall 3/3 back precautions independently Required Braces or Orthoses: Spinal Brace Spinal Brace: Applied in sitting position;Lumbar corset Restrictions Weight Bearing Restrictions: No      Mobility  Bed Mobility Overal bed mobility: Modified Independent             General bed mobility comments: able to perform log rolling technique   Transfers Overall transfer level: Modified independent Equipment used: Rolling walker (2 wheeled)             General transfer comment: performed sit to stand from bed and from toilet without difficulty; good hand placement   Ambulation/Gait Ambulation/Gait assistance: Modified independent (Device/Increase time) Ambulation Distance (Feet): 300 Feet Assistive device: Rolling walker (2 wheeled) Gait Pattern/deviations: WFL(Within Functional Limits) Gait velocity: decr; guarded due to pain Gait velocity interpretation: Below normal speed for age/gender General Gait Details: pt demo good technique with RW; c/o numbness in feet from neuropathy and feels safer ambulating with RW  Stairs            Wheelchair Mobility     Modified Rankin (Stroke Patients Only)       Balance Overall balance assessment: No apparent balance deficits (not formally assessed);Modified Independent                                           Pertinent Vitals/Pain 2/10; "constant throbbing" at surgical site; patient repositioned for comfort in chair and premedicated.     Home Living Family/patient expects to be discharged to:: Private residence Living Arrangements: Children Available Help at Discharge: Family Type of Home: Apartment Home Access: Level entry     Home Layout: One level Home Equipment: Cane - single point;Grab bars - tub/shower;Walker - 2 wheels Additional Comments: hsa 3 children older than 16 that can assist    Prior Function Level of Independence: Independent with assistive device(s)         Comments: walks with a cane     Hand Dominance   Dominant Hand: Right    Extremity/Trunk Assessment   Upper Extremity Assessment: Defer to OT evaluation           Lower Extremity Assessment: Overall WFL for tasks assessed      Cervical / Trunk Assessment: Normal  Communication   Communication: No difficulties  Cognition Arousal/Alertness: Awake/alert Behavior During Therapy: WFL for tasks assessed/performed Overall Cognitive Status: Within Functional Limits for tasks assessed                      General Comments      Exercises  Assessment/Plan    PT Assessment Patent does not need any further PT services  PT Diagnosis     PT Problem List    PT Treatment Interventions     PT Goals (Current goals can be found in the Care Plan section) Acute Rehab PT Goals Patient Stated Goal: go home saturday PT Goal Formulation: No goals set, d/c therapy    Frequency     Barriers to discharge        Co-evaluation               End of Session Equipment Utilized During Treatment: Gait belt;Back brace Activity Tolerance: Patient tolerated treatment  well Patient left: in chair;with call bell/phone within reach Nurse Communication: Mobility status         Time: 1610-9604 PT Time Calculation (min): 19 min   Charges:   PT Evaluation $Initial PT Evaluation Tier I: 1 Procedure PT Treatments $Gait Training: 8-22 mins   PT G CodesDonell Sievert, [place]  [phone] 05/24/2014, 11:50 AM

## 2014-05-25 LAB — GLUCOSE, CAPILLARY
Glucose-Capillary: 164 mg/dL — ABNORMAL HIGH (ref 70–99)
Glucose-Capillary: 209 mg/dL — ABNORMAL HIGH (ref 70–99)

## 2014-05-25 MED ORDER — OXYCODONE-ACETAMINOPHEN 5-325 MG PO TABS: 1.0000 | ORAL_TABLET | ORAL | Status: DC | PRN

## 2014-05-25 MED ORDER — METHOCARBAMOL 500 MG PO TABS: 500.0000 mg | ORAL_TABLET | Freq: Four times a day (QID) | ORAL | Status: DC | PRN

## 2014-05-25 NOTE — Discharge Summary (Signed)
Physician Discharge Summary  Patient ID: Dominique MaidensSusan Evans MRN: [id number] DOB/AGE: 50/04/1966 50 y.o.  Admit date: 05/23/2014 Discharge date: 05/25/2014  Admission Diagnoses: lumbar instability/ recurrent disk    Discharge Diagnoses: same   Discharged Condition: good  Hospital Course: The patient was admitted on 05/23/2014 and taken to the operating room where the patient underwent PLIF. The patient tolerated the procedure well and was taken to the recovery room and then to the floor in stable condition. The hospital course was routine. There were no complications. The wound remained clean dry and intact. Pt had appropriate back soreness. No complaints of leg pain or new N/T/W. The patient remained afebrile with stable vital signs, and tolerated a regular diet. The patient continued to increase activities, and pain was well controlled with oral pain medications.   Consults: none  Significant Diagnostic Studies:  Results for orders placed during the hospital encounter of 05/23/14  GLUCOSE, CAPILLARY      Result Value Ref Range   Glucose-Capillary 115 (*) 70 - 99 mg/dL  GLUCOSE, CAPILLARY      Result Value Ref Range   Glucose-Capillary 156 (*) 70 - 99 mg/dL  GLUCOSE, CAPILLARY      Result Value Ref Range   Glucose-Capillary 171 (*) 70 - 99 mg/dL  GLUCOSE, CAPILLARY      Result Value Ref Range   Glucose-Capillary 188 (*) 70 - 99 mg/dL   Comment 1 Documented in Chart     Comment 2 Notify RN    GLUCOSE, CAPILLARY      Result Value Ref Range   Glucose-Capillary 200 (*) 70 - 99 mg/dL   Comment 1 Notify RN     Comment 2 Documented in Chart    GLUCOSE, CAPILLARY      Result Value Ref Range   Glucose-Capillary 260 (*) 70 - 99 mg/dL   Comment 1 Notify RN     Comment 2 Documented in Chart    GLUCOSE, CAPILLARY      Result Value Ref Range   Glucose-Capillary 184 (*) 70 - 99 mg/dL  GLUCOSE, CAPILLARY      Result Value Ref Range   Glucose-Capillary 267 (*) 70 - 99 mg/dL   Comment 1  Documented in Chart     Comment 2 Notify RN    GLUCOSE, CAPILLARY      Result Value Ref Range   Glucose-Capillary 164 (*) 70 - 99 mg/dL   Comment 1 Notify RN     Comment 2 Documented in Chart    TYPE AND SCREEN      Result Value Ref Range   ABO/RH(D) O POS     Antibody Screen NEG     Sample Expiration 06/06/2014      Dg Lumbar Spine 2-3 Views  05/23/2014   CLINICAL DATA:  L4-5 PLIF  EXAM: LUMBAR SPINE - 2-3 VIEW; DG C-ARM 61-120 MIN  COMPARISON:  None.  FINDINGS: Two spot films were obtained during PLIF procedure at L4-5. Interbody fusion with posterior fixation is noted.   Electronically Signed   By: Alcide CleverMark  Lukens M.D.   On: 05/23/2014 15:24   Dg C-arm 1-60 Min  05/23/2014   CLINICAL DATA:  L4-5 PLIF  EXAM: LUMBAR SPINE - 2-3 VIEW; DG C-ARM 61-120 MIN  COMPARISON:  None.  FINDINGS: Two spot films were obtained during PLIF procedure at L4-5. Interbody fusion with posterior fixation is noted.   Electronically Signed   By: Alcide CleverMark  Lukens M.D.   On: 05/23/2014 15:24  Antibiotics:  Anti-infectives   Start     Dose/Rate Route Frequency Ordered Stop   05/23/14 2200  vancomycin (VANCOCIN) IVPB 1000 mg/200 mL premix     1,000 mg 200 mL/hr over 60 Minutes Intravenous Every 12 hours 05/23/14 1738     05/23/14 1253  bacitracin 50,000 Units in sodium chloride irrigation 0.9 % 500 mL irrigation  Status:  Discontinued       As needed 05/23/14 1253 05/23/14 1534   05/23/14 0600  vancomycin (VANCOCIN) 1,500 mg in sodium chloride 0.9 % 500 mL IVPB     1,500 mg 250 mL/hr over 120 Minutes Intravenous On call to O.R. 05/22/14 1223 05/23/14 1348      Discharge Exam: Blood pressure 144/73, pulse 55, temperature 98.6 F (37 C), temperature source Oral, resp. rate 16, height 5' 6.5" (1.689 m), weight 109.135 kg (240 lb 9.6 oz), SpO2 97.00%. Normal neuro exam Incision CDI  Discharge Medications:     Medication List         amitriptyline 25 MG tablet  Commonly known as:  ELAVIL  Take 50 mg by  mouth at bedtime.     CALCIUM-VITAMIN D PO  Take 1 tablet by mouth 2 (two) times daily.     cyanocobalamin 500 MCG tablet  Take 500 mcg by mouth daily.     desvenlafaxine 50 MG 24 hr tablet  Commonly known as:  PRISTIQ  Take 50 mg by mouth every morning.     DEXILANT 60 MG capsule  Generic drug:  dexlansoprazole  Take 60 mg by mouth daily after breakfast.     ferrous sulfate 325 (65 FE) MG tablet  Take 325 mg by mouth daily with breakfast.     Fluticasone-Salmeterol 250-50 MCG/DOSE Aepb  Commonly known as:  ADVAIR  Inhale 1 puff into the lungs 2 (two) times daily.     furosemide 20 MG tablet  Commonly known as:  LASIX  Take 20 mg by mouth every morning.     gabapentin 300 MG capsule  Commonly known as:  NEURONTIN  Take 300-900 mg by mouth 3 (three) times daily. *takes 300mg  in the morning, 900mg  during the day, and 600mg  at bedtime*     HUMIRA PEN 40 MG/0.8ML injection  Generic drug:  adalimumab  Inject 40 mg into the skin every 14 (fourteen) days.     losartan 50 MG tablet  Commonly known as:  COZAAR  Take 50 mg by mouth every morning.     methocarbamol 500 MG tablet  Commonly known as:  ROBAXIN  Take 1 tablet (500 mg total) by mouth every 6 (six) hours as needed for muscle spasms.     MIRALAX powder  Generic drug:  polyethylene glycol powder  Take 17 g by mouth daily.     montelukast 10 MG tablet  Commonly known as:  SINGULAIR  Take 10 mg by mouth daily.     MULTIVITAMIN PO  Take 1 tablet by mouth daily.     norethindrone-ethinyl estradiol 1-20 MG-MCG tablet  Commonly known as:  JUNEL FE,GILDESS FE,LOESTRIN FE  Take 1 tablet by mouth at bedtime.     oxyCODONE-acetaminophen 5-325 MG per tablet  Commonly known as:  PERCOCET/ROXICET  Take 1-2 tablets by mouth every 4 (four) hours as needed for moderate pain.     PREVAGEN 10 MG Caps  Generic drug:  Apoaequorin  Take 10 mg by mouth daily.     PROAIR HFA 108 (90 BASE) MCG/ACT inhaler  Generic drug:  albuterol  Inhale 2 puffs into the lungs every 4 (four) hours as needed for wheezing or shortness of breath. WHEEZING     rosuvastatin 10 MG tablet  Commonly known as:  CRESTOR  Take 5 mg by mouth every morning.     verapamil 240 MG (CO) 24 hr tablet  Commonly known as:  COVERA HS  Take 240 mg by mouth every morning.        Disposition: home   Final Dx: PLIF      Discharge Instructions   Call MD for:  difficulty breathing, headache or visual disturbances    Complete by:  As directed      Call MD for:  persistant nausea and vomiting    Complete by:  As directed      Call MD for:  redness, tenderness, or signs of infection (pain, swelling, redness, odor or green/yellow discharge around incision site)    Complete by:  As directed      Call MD for:  severe uncontrolled pain    Complete by:  As directed      Call MD for:  temperature >100.4    Complete by:  As directed      Diet - low sodium heart healthy    Complete by:  As directed      Discharge instructions    Complete by:  As directed   No strenuous activity, no bending or twisting, no driving, may shower     Increase activity slowly    Complete by:  As directed            Follow-up Information   Follow up with Aiza Vollrath S, MD. Schedule an appointment as soon as possible for a visit in 3 weeks.   Specialty:  Neurosurgery   Contact information:   [address] [place] [address] [phone]        Signed: Tia Alert 05/25/2014, 10:45 AM

## 2014-05-25 NOTE — Progress Notes (Signed)
Patient demonstrated proper use of back brace. Ambulated in hall this AM. 0ml output from hemovac this shift.

## 2014-05-25 NOTE — Progress Notes (Signed)
Discharge instructions reviewed with patient. All questions answered. RX given to patient. Awaiting on transportation.  Sim BoastHavy, RN

## 2014-11-21 ENCOUNTER — Encounter: Payer: Self-pay | Admitting: Gastroenterology

## 2015-01-01 ENCOUNTER — Telehealth: Payer: Self-pay | Admitting: Pulmonary Disease

## 2015-01-01 NOTE — Telephone Encounter (Signed)
Called and spoke to pt. Pt requesting to be seen. Pt last seen on 01/06/12. Appt made with TP on 01/03/15--ok'ed by Jess. Pt aware of time and date of appt. Nothing further needed.

## 2015-01-03 ENCOUNTER — Ambulatory Visit (INDEPENDENT_AMBULATORY_CARE_PROVIDER_SITE_OTHER): Payer: Managed Care, Other (non HMO) | Admitting: Adult Health

## 2015-01-03 ENCOUNTER — Encounter: Payer: Self-pay | Admitting: *Deleted

## 2015-01-03 ENCOUNTER — Ambulatory Visit (INDEPENDENT_AMBULATORY_CARE_PROVIDER_SITE_OTHER)
Admission: RE | Admit: 2015-01-03 | Discharge: 2015-01-03 | Disposition: A | Discharge status: Home / Self Care | Payer: Managed Care, Other (non HMO) | Source: Ambulatory Visit | Attending: Adult Health | Admitting: Adult Health

## 2015-01-03 ENCOUNTER — Encounter: Payer: Self-pay | Admitting: Adult Health

## 2015-01-03 VITALS — BP 148/72 | HR 80 | Temp 97.8°F | Ht 66.5 in | Wt 214.8 lb

## 2015-01-03 DIAGNOSIS — J45991 Cough variant asthma: Secondary | ICD-10-CM

## 2015-01-03 MED ORDER — PREDNISONE 10 MG PO TABS: ORAL_TABLET | ORAL | Status: DC

## 2015-01-03 MED ORDER — HYDROCODONE-HOMATROPINE 5-1.5 MG/5ML PO SYRP: 5.0000 mL | ORAL_SOLUTION | Freq: Four times a day (QID) | ORAL | Status: DC | PRN

## 2015-01-03 MED ORDER — CLARITHROMYCIN ER 500 MG PO TB24: 1000.0000 mg | ORAL_TABLET | Freq: Every day | ORAL | Status: DC

## 2015-01-03 NOTE — Assessment & Plan Note (Signed)
Asthmatic Bronchitic exacerbation -slow to resolve   Plan  Biaxin XL pack take as directed.  Prednisone taper over next week.  Mucinex DM Twice daily  As needed  Cough/congestion  Chest xray today  Hydromet 1 tsp every 6hrs as needed for cough -may make you sleepy.  Follow up Dr. Craige CottaSood  In 6 weeks and As needed   Please contact office for sooner follow up if symptoms do not improve or worsen or seek emergency care

## 2015-01-03 NOTE — Patient Instructions (Signed)
Biaxin XL pack take as directed.  Prednisone taper over next week.  Mucinex DM Twice daily  As needed  Cough/congestion  Chest xray today  Hydromet 1 tsp every 6hrs as needed for cough -may make you sleepy.  Follow up Dr. Craige CottaSood  In 6 weeks and As needed   Please contact office for sooner follow up if symptoms do not improve or worsen or seek emergency care

## 2015-01-03 NOTE — Progress Notes (Signed)
Chief Complaint  Patient presents with  . Acute Visit    VS pt here for chest congestion, dry cough, wheezing, tightness, increased SOB x2 weeks.  denies any f/c/s, n/v/d, hemoptysis.  finished pred taper today from PCP and clindamycin  for toe infection    History of Present Illness: Dominique Evans is a 49 y.o. female former smoker with chronic cough from asthma, post-nasal drip, and reflux.  Presents for an acute office visit. Complains of chest congestion, dry cough, wheezing, tightness, increased SOB x2 weeks.  Denies any f/c/s, n/v/d, hemoptysis, edema or calf pain. Dominique Evans pred taper today from PCP and clindamycin  for toe infection. Does not feel any better. Cough is keeping her up at night . Says she has had several flares over the last year with abx and steroids.  Coughing up thick yellow mucus. No recent travel.   Has Psorasis on Humira .   Past Medical History  Diagnosis Date  . Unspecified essential hypertension   . Hyperlipidemia   . GERD (gastroesophageal reflux disease)   . 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 Chronic constipation   . Obesity   . Asthma     PFT 08/12/07: FEV1 2.97 (104%), FEV1% 84, TLC 4.89 (90%), DLCO 84%, +BD  . Allergic rhinitis   . Panic attacks   . Hydatidiform mole   . Visual disturbance   . PONV (postoperative nausea and vomiting)   . Anemia   . Blood transfusion     hx of 2002   . 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 Depression   . Psoriasis   . Hernia     LLQ (after kidney removed)  . Diabetes mellitus     pre gastric bypass  . Cough 03/23/13  . Peripheral neuropathy     bilateral feet  . History of stress test     exercise stress test, ? where it was done,  about 6 yrs. ago,  results- negative   . H/O hiatal hernia   . Arthritis     spondylolisthesis- lumbar region    Past Surgical History  Procedure Laterality Date  . Nephrectomy  2002   left  . Elbow fracture surgery      left  . Shoulder arthroscopy      right shoulder  . Laparoscopic gastric banding  02/2006    w/ truncal vagotomy  . Patent ductus arterious repair    . Lap band removal     . Gastric roux-en-y  10/05/2011    Procedure: LAPAROSCOPIC ROUX-EN-Y GASTRIC;  Surgeon: Dominique Saa, MD;  Location: [facility];  Service: General;  Laterality: N/A;  UPPER ENDOSCOPY  . Lumbar laminectomy/decompression microdiscectomy Left 11/30/2012    Procedure: MICRO LUMBAR DECOMPRESSION L4-5 ON THE LEFT;  Surgeon: Dominique Docker, MD;  Location: [facility];  Service: Orthopedics;  Laterality: Left;  MICRO LUMBAR DECOMPRESSION L4-5 ON THE LEFT  . Lumbar laminectomy/decompression microdiscectomy N/A 03/30/2013    Procedure: REDO MICRO LUMBAR DECOMPRESSION L4-5;  Surgeon: Dominique Docker, MD;  Location: [facility];  Service: Orthopedics;  Laterality: N/A;  . Maximum access (mas)posterior lumbar interbody fusion (plif) 1 level N/A 05/23/2014    Procedure: FOR MAXIMUM ACCESS (MAS) POSTERIOR LUMBAR INTERBODY FUSION (PLIF) Lumbar Four-Five;  Surgeon: Dominique Alert, MD;  Location: [facility];  Service: Neurosurgery;  Laterality: N/A;  FOR MAXIMUM ACCESS (MAS) POSTERIOR  LUMBAR INTERBODY FUSION (PLIF) Lumbar Four-Five    Allergies  Allergen Reactions  . Penicillins Anaphylaxis  . Sulfonamide Derivatives Anaphylaxis  . Ivp Dye [Iodinated Diagnostic Agents]     REACTION: asthma attack  . Ciprofloxacin Hives    Physical Exam:  There were no vitals taken for this visit. There is no weight on file to calculate BMI.  Wt Readings from Last 2 Encounters:  05/23/14 240 lb 9.6 oz (109.135 kg)  06/28/13 242 lb (109.77 kg)    General - Obese  HEENT - no sinus tenderness, no oral exudate, no LAN Cardiac - s1s2 regular, no murmur Chest - barking cough, scattered rhonchi  Abdomen - soft, nontender Extremities - no edema Skin - no rashes Neurologic - normal strength Psychiatric - normal mood,  behavior   Assessment/Plan:  Outpatient Encounter Prescriptions as of 01/03/2015  Medication Sig  . adalimumab (HUMIRA PEN) 40 MG/0.8ML injection Inject 40 mg into the skin every 14 (fourteen) days.   Marland Kitchen. amitriptyline (ELAVIL) 25 MG tablet Take 50 mg by mouth at bedtime.   Marland Kitchen. Apoaequorin (PREVAGEN) 10 MG CAPS Take 10 mg by mouth daily.  Marland Kitchen. CALCIUM-VITAMIN D PO Take 1 tablet by mouth 2 (two) times daily.   . cyanocobalamin 500 MCG tablet Take 500 mcg by mouth daily.  Marland Kitchen. desvenlafaxine (PRISTIQ) 50 MG 24 hr tablet Take 50 mg by mouth every morning.   Marland Kitchen. dexlansoprazole (DEXILANT) 60 MG capsule Take 60 mg by mouth daily after breakfast.   . ferrous sulfate 325 (65 FE) MG tablet Take 325 mg by mouth daily with breakfast.  . Fluticasone-Salmeterol (ADVAIR) 250-50 MCG/DOSE AEPB Inhale 1 puff into the lungs 2 (two) times daily.  . furosemide (LASIX) 20 MG tablet Take 20 mg by mouth every morning.   . gabapentin (NEURONTIN) 300 MG capsule Take 300-900 mg by mouth 3 (three) times daily. *takes 300mg  in the morning, 900mg  during the day, and 600mg  at bedtime*  . losartan (COZAAR) 50 MG tablet Take 50 mg by mouth every morning.   . methocarbamol (ROBAXIN) 500 MG tablet Take 1 tablet (500 mg total) by mouth every 6 (six) hours as needed for muscle spasms.  . montelukast (SINGULAIR) 10 MG tablet Take 10 mg by mouth daily.   . Multiple Vitamins-Minerals (MULTIVITAMIN PO) Take 1 tablet by mouth daily.   . norethindrone-ethinyl estradiol (JUNEL FE,GILDESS FE,LOESTRIN FE) 1-20 MG-MCG tablet Take 1 tablet by mouth at bedtime.   Marland Kitchen. oxyCODONE-acetaminophen (PERCOCET/ROXICET) 5-325 MG per tablet Take 1-2 tablets by mouth every 4 (four) hours as needed for moderate pain.  . polyethylene glycol (MIRALAX) powder Take 17 g by mouth daily.   Marland Kitchen. PROAIR HFA 108 (90 BASE) MCG/ACT inhaler Inhale 2 puffs into the lungs every 4 (four) hours as needed for wheezing or shortness of breath. WHEEZING  . rosuvastatin (CRESTOR) 10 MG  tablet Take 5 mg by mouth every morning.   . verapamil (COVERA HS) 240 MG (CO) 24 hr tablet Take 240 mg by mouth every morning.     Cherylyn Sundby NP-C  Pager:  [phone] 01/03/2015, 9:39 AM

## 2015-01-04 NOTE — Progress Notes (Signed)
Reviewed and agree with assessment/plan. 

## 2015-01-07 ENCOUNTER — Telehealth: Payer: Self-pay | Admitting: Pulmonary Disease

## 2015-01-07 NOTE — Telephone Encounter (Signed)
Called and spoke to pt. Informed pt of the results and recs per TP. Pt verbalized understanding and denied any further questions or concerns at this time.    Notes Recorded by Julio Sicksammy S Parrett, NP on 01/07/2015 at 2:41 PM No sign of PNA  Cont w/ ov recs

## 2015-01-07 NOTE — Progress Notes (Signed)
Quick Note:  Called and spoke to pt. Informed pt of the results and recs per TP. Pt verbalized understanding and denied any further questions or concerns at this time. ______ 

## 2015-01-08 ENCOUNTER — Ambulatory Visit: Payer: Managed Care, Other (non HMO) | Admitting: Gastroenterology

## 2015-02-25 ENCOUNTER — Other Ambulatory Visit (HOSPITAL_COMMUNITY): Payer: Self-pay | Admitting: Obstetrics and Gynecology

## 2015-02-26 LAB — CYTOLOGY - PAP

## 2015-03-01 ENCOUNTER — Ambulatory Visit (INDEPENDENT_AMBULATORY_CARE_PROVIDER_SITE_OTHER): Payer: Managed Care, Other (non HMO) | Admitting: Gastroenterology

## 2015-03-01 ENCOUNTER — Encounter: Payer: Self-pay | Admitting: Gastroenterology

## 2015-03-01 VITALS — BP 134/70 | HR 88 | Ht 66.25 in | Wt 224.4 lb

## 2015-03-01 DIAGNOSIS — K5909 Other constipation: Secondary | ICD-10-CM

## 2015-03-01 DIAGNOSIS — R1314 Dysphagia, pharyngoesophageal phase: Secondary | ICD-10-CM | POA: Diagnosis not present

## 2015-03-01 DIAGNOSIS — K224 Dyskinesia of esophagus: Secondary | ICD-10-CM

## 2015-03-01 DIAGNOSIS — R1310 Dysphagia, unspecified: Secondary | ICD-10-CM | POA: Diagnosis not present

## 2015-03-01 DIAGNOSIS — Z1211 Encounter for screening for malignant neoplasm of colon: Secondary | ICD-10-CM | POA: Diagnosis not present

## 2015-03-01 DIAGNOSIS — K439 Ventral hernia without obstruction or gangrene: Secondary | ICD-10-CM

## 2015-03-01 HISTORY — DX: Dyskinesia of esophagus: K22.4

## 2015-03-01 HISTORY — DX: Ventral hernia without obstruction or gangrene: K43.9

## 2015-03-01 MED ORDER — NA SULFATE-K SULFATE-MG SULF 17.5-3.13-1.6 GM/177ML PO SOLN: 1.0000 | Freq: Once | ORAL | Status: DC

## 2015-03-01 NOTE — Assessment & Plan Note (Signed)
She's had a good response to MiraLAX and will continue daily use

## 2015-03-01 NOTE — Progress Notes (Signed)
_                                                                                                                History of Present Illness:  [name] is a 49 year old white female referred at the request of Dr. Neale Burly for evaluation of dysphagia for colonoscopy.  For the past 7-8 years she's been complaining of dysphagia to solids and liquids.  Upper GI series in 2012, which I reviewed, demonstrated a markedly dilated esophagus with distal narrowing but no obstruction.  Patient is status post gastric banding later revised to a Roux-en-Y gastric bypass, for obesity.  She denies pyrosis.  She frequent we will vomit up food and liquid.  Esophageal manometry in 2012, which I reviewed, demonstrated a poorly peristaltic and tractions with retrograde contractions.  LES relaxation was incomplete but was very low.  I needs were suggestive of achalasia.  Dysphagia did not improve when the lap band was removed.  Patient is requesting screening colonoscopy.  She has a large ventral hernia and tended to be constipated until she started daily MiraLAX.   Past Medical History  Diagnosis Date  . Unspecified essential hypertension   . GERD (gastroesophageal reflux disease)   . 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 Chronic constipation   . Obesity   . Asthma     PFT 08/12/07: FEV1 2.97 (104%), FEV1% 84, TLC 4.89 (90%), DLCO 84%, +BD  . Allergic rhinitis   . Panic attacks   . Hydatidiform mole   . Visual disturbance   . PONV (postoperative nausea and vomiting)   . Anemia   . Blood transfusion     hx of 2002   . 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 Depression   . Psoriasis   . Hernia     LLQ (after kidney removed)  . Diabetes mellitus     pre gastric bypass  . Cough 03/23/13  . Peripheral neuropathy     bilateral feet  . History of stress test     exercise stress test, ? where it was done,  about 6 yrs. ago,   results- negative   . H/O hiatal hernia   . Arthritis     spondylolisthesis- lumbar region  . Status post dilation of esophageal narrowing   . Hypertension   . Hyperlipidemia    Past Surgical History  Procedure Laterality Date  . Nephrectomy  2002    left  . Elbow fracture surgery      left  . Shoulder arthroscopy      right shoulder  . Laparoscopic gastric banding  02/2006    w/ truncal vagotomy  . Patent ductus arterious repair    . Lap band removal     . 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 Lumbar laminectomy/decompression microdiscectomy Left 11/30/2012    Procedure: MICRO LUMBAR DECOMPRESSION L4-5 ON THE LEFT;  Surgeon: Javier DockerJeffrey C Beane, MD;  Location: [facility];  Service: Orthopedics;  Laterality: Left;  MICRO LUMBAR DECOMPRESSION L4-5 ON THE LEFT  . Lumbar laminectomy/decompression microdiscectomy N/A 03/30/2013    Procedure: REDO MICRO LUMBAR DECOMPRESSION L4-5;  Surgeon: Javier DockerJeffrey C Beane, MD;  Location: [facility];  Service: Orthopedics;  Laterality: N/A;  . Maximum access (mas)posterior lumbar interbody fusion (plif) 1 level N/A 05/23/2014    Procedure: FOR MAXIMUM ACCESS (MAS) POSTERIOR LUMBAR INTERBODY FUSION (PLIF) Lumbar Four-Five;  Surgeon: Tia Alertavid S Jones, MD;  Location: [facility];  Service: Neurosurgery;  Laterality: N/A;  FOR MAXIMUM ACCESS (MAS) POSTERIOR LUMBAR INTERBODY FUSION (PLIF) Lumbar Four-Five   family history includes Brain cancer in her mother; Breast cancer in her maternal aunt; Cancer in her mother; Diabetes in her mother; Heart attack in her father and sister; Heart disease in her father, mother, and sister; Hyperlipidemia in her father, mother, and sister; Hypertension in her father, mother, and sister; Lung cancer in her mother; Skin cancer in her father and mother. There is no history of Colon cancer, Colon polyps,  Esophageal cancer, or Gallbladder disease. Current Outpatient Prescriptions  Medication Sig Dispense Refill  . adalimumab (HUMIRA PEN) 40 MG/0.8ML injection Inject 40 mg into the skin every 14 (fourteen) days.     Marland Kitchen. albuterol (ACCUNEB) 1.25 MG/3ML nebulizer solution Take 3 mLs by nebulization every 4 (four) hours as needed.  3  . amitriptyline (ELAVIL) 25 MG tablet Take 50 mg by mouth at bedtime.     Marland Kitchen. Apoaequorin (PREVAGEN) 10 MG CAPS Take 10 mg by mouth daily.    Marland Kitchen. CALCIUM-VITAMIN D PO Take 1 tablet by mouth 2 (two) times daily.     . clarithromycin (BIAXIN XL) 500 MG 24 hr tablet Take 2 tablets (1,000 mg total) by mouth daily. 14 tablet 0  . Clobetasol Propionate (TEMOVATE) 0.05 % external spray As directed when needed    . cyanocobalamin 500 MCG tablet Take 500 mcg by mouth daily.    Marland Kitchen. dexlansoprazole (DEXILANT) 60 MG capsule Take 60 mg by mouth daily after breakfast.     . DULOXETINE HCL PO Take 120 mg by mouth daily.    . ferrous sulfate 325 (65 FE) MG tablet Take 325 mg by mouth daily with breakfast.    . Fluticasone-Salmeterol (ADVAIR) 250-50 MCG/DOSE AEPB Inhale 1 puff into the lungs 2 (two) times daily.    . furosemide (LASIX) 20 MG tablet Take 20 mg by mouth every morning.     . gabapentin (NEURONTIN) 300 MG capsule Take 300 mg by mouth 6 (six) times daily.     Marland Kitchen. HYDROcodone-homatropine (HYDROMET) 5-1.5 MG/5ML syrup Take 5 mLs by mouth every 6 (six) hours as needed. 240 mL 0  . losartan (COZAAR) 50 MG tablet Take 50 mg by mouth every morning.     . methocarbamol (ROBAXIN) 500 MG tablet Take 1 tablet (500 mg total) by mouth every 6 (six) hours as needed for muscle spasms. 60 tablet 1  . montelukast (SINGULAIR) 10 MG tablet Take 10 mg by mouth daily.     . Multiple Vitamins-Minerals (MULTIVITAMIN PO) Take 1 tablet by mouth daily.     . norethindrone-ethinyl estradiol (JUNEL FE,GILDESS FE,LOESTRIN FE) 1-20 MG-MCG tablet Take 1 tablet by mouth at bedtime.     . polyethylene glycol  (MIRALAX) powder Take 17 g by mouth daily.     .Marland Kitchen  predniSONE (DELTASONE) 10 MG tablet 4 tabs for 2 days, then 3 tabs for 2 days, 2 tabs for 2 days, then 1 tab for 2 days, then stop 20 tablet 0  . PROAIR HFA 108 (90 BASE) MCG/ACT inhaler Inhale 2 puffs into the lungs every 4 (four) hours as needed for wheezing or shortness of breath. WHEEZING    . rosuvastatin (CRESTOR) 10 MG tablet Take 5 mg by mouth every morning.     . verapamil (COVERA HS) 240 MG (CO) 24 hr tablet Take 240 mg by mouth every morning.      No current facility-administered medications for this visit.   Allergies as of 03/01/2015 - Review Complete 03/01/2015  Allergen Reaction Noted  . Penicillins Anaphylaxis   . Sulfonamide derivatives Anaphylaxis   . Ivp dye [iodinated diagnostic agents]  01/03/2015  . Ciprofloxacin Hives 01/05/2013    reports that she quit smoking about 32 years ago. Her smoking use included Cigarettes. She smoked 2.00 packs per day. She has never used smokeless tobacco. She reports that she does not drink alcohol or use illicit drugs.   Review of Systems: Pertinent positive and negative review of systems were noted in the above HPI section. All other review of systems were otherwise negative.  Vital signs were reviewed in today's medical record Physical Exam: General: Well developed , well nourished, no acute distress Skin: anicteric Head: Normocephalic and atraumatic Eyes:  sclerae anicteric, EOMI Ears: Normal auditory acuity Mouth: No deformity or lesions Neck: Supple, no masses or thyromegaly Lymph Nodes: no lymphadenopathy Lungs: Clear throughout to auscultation Heart: Regular rate and rhythm; no murmurs, rubs or bruits Gastroinestinal: Soft, non tender and non distended. No masses, hepatosplenomegaly or hernias noted. Normal Bowel sounds.  There is a large left ventral hernia Rectal:deferred Musculoskeletal: Symmetrical with no gross deformities  Skin: No lesions on visible  extremities Pulses:  Normal pulses noted Extremities: No clubbing, cyanosis, edema or deformities noted Neurological: Alert oriented x 4, grossly nonfocal Cervical Nodes:  No significant cervical adenopathy Inguinal Nodes: No significant inguinal adenopathy Psychological:  Alert and cooperative. Normal mood and affect  See Assessment and Plan under Problem List

## 2015-03-01 NOTE — Addendum Note (Signed)
Addended by: Marlowe KaysSTALLINGS, Storm Sovine M on: 03/01/2015 05:00 PM   Modules accepted: Orders

## 2015-03-01 NOTE — Assessment & Plan Note (Signed)
X-rays and manometry are very suggestive of achalasia.  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    Recommendations #1 upper endoscopy #2.  Results of #1 would consider resolution esophageal manometry

## 2015-03-01 NOTE — Patient Instructions (Signed)
You have been scheduled for an endoscopy. Please follow written instructions given to you at your visit today. If you use inhalers (even only as needed), please bring them with you on the day of your procedure. Your physician has requested that you go to www.[email or web address] and enter the access code given to you at your visit today. This web site gives a general overview about your procedure. However, you should still follow specific instructions given to you by our office regarding your preparation for the procedure.  You have been scheduled for a colonoscopy. Please follow written instructions given to you at your visit today.  Please pick up your prep supplies at the pharmacy within the next 1-3 days. If you use inhalers (even only as needed), please bring them with you on the day of your procedure. Your physician has requested that you go to www.[email or web address] and enter the access code given to you at your visit today. This web site gives a general overview about your procedure. However, you should still follow specific instructions given to you by our office regarding your preparation for the procedure. 

## 2015-03-01 NOTE — Assessment & Plan Note (Signed)
Plan screening colonoscopy 

## 2015-03-01 NOTE — Addendum Note (Signed)
Addended by: Marlowe KaysSTALLINGS, Aries Townley M on: 03/01/2015 04:57 PM   Modules accepted: Orders

## 2015-03-04 ENCOUNTER — Ambulatory Visit (INDEPENDENT_AMBULATORY_CARE_PROVIDER_SITE_OTHER): Payer: Managed Care, Other (non HMO) | Admitting: Pulmonary Disease

## 2015-03-04 ENCOUNTER — Encounter: Payer: Self-pay | Admitting: Pulmonary Disease

## 2015-03-04 ENCOUNTER — Other Ambulatory Visit: Payer: Self-pay

## 2015-03-04 ENCOUNTER — Encounter: Payer: Self-pay | Admitting: Gastroenterology

## 2015-03-04 ENCOUNTER — Ambulatory Visit (AMBULATORY_SURGERY_CENTER): Payer: Managed Care, Other (non HMO) | Admitting: Gastroenterology

## 2015-03-04 VITALS — BP 116/68 | HR 85 | Ht 66.5 in | Wt 220.6 lb

## 2015-03-04 VITALS — BP 153/108 | HR 71 | Temp 98.3°F | Resp 13 | Ht 66.0 in | Wt 224.0 lb

## 2015-03-04 DIAGNOSIS — K219 Gastro-esophageal reflux disease without esophagitis: Secondary | ICD-10-CM

## 2015-03-04 DIAGNOSIS — J45991 Cough variant asthma: Secondary | ICD-10-CM | POA: Diagnosis not present

## 2015-03-04 DIAGNOSIS — K224 Dyskinesia of esophagus: Secondary | ICD-10-CM

## 2015-03-04 DIAGNOSIS — R05 Cough: Secondary | ICD-10-CM

## 2015-03-04 DIAGNOSIS — R1310 Dysphagia, unspecified: Secondary | ICD-10-CM

## 2015-03-04 DIAGNOSIS — K209 Esophagitis, unspecified without bleeding: Secondary | ICD-10-CM

## 2015-03-04 DIAGNOSIS — R058 Other specified cough: Secondary | ICD-10-CM

## 2015-03-04 DIAGNOSIS — J4530 Mild persistent asthma, uncomplicated: Secondary | ICD-10-CM | POA: Diagnosis not present

## 2015-03-04 DIAGNOSIS — R053 Chronic cough: Secondary | ICD-10-CM

## 2015-03-04 LAB — GLUCOSE, CAPILLARY: Glucose-Capillary: 96 mg/dL (ref 65–99)

## 2015-03-04 MED ORDER — SODIUM CHLORIDE 0.9 % IV SOLN: 500.0000 mL | INTRAVENOUS | Status: DC

## 2015-03-04 NOTE — Progress Notes (Signed)
Chief Complaint  Patient presents with  . Follow-up    Saw TP 01/03/15 - Pt states that cough, SOB and wheezing has resolved. No complaints.    History of Present Illness: Dominique Evans is a 49 y.o. female former smoker with chronic cough, asthma, upper airway cough and GERD.  I had last seen her in 2013.  She was seen by Rubye Oaksammy Parrett in March for exacerbation.    She is doing better.  She is not having cough, wheeze, sputum, or chest pain.  She has some sinus congestion, but this is helped by using fexofenadine.  She was seen by an allergist years ago.  She recently resumed adalimumab for psoriasis.  CXR from 01/03/15 showed Rt sided aortic arch.   TESTS: PFT 08/12/07 >> FEV1 2.97(104%), FEV1% 84, TLC 4.89(90%), DLCO 84%, +BD   PMHx >> HTN, HLD, Panic attacks, Depression, Psoriasis, DM, Peripheral neuropathy, GERD, HH  Past surgical hx, Medications, Allergies, Family hx, Social hx all reviewed.   Physical Exam: Blood pressure 116/68, pulse 85, height 5' 6.5" (1.689 m), weight 220 lb 9.6 oz (100.064 kg), SpO2 98 %. Body mass index is 35.08 kg/(m^2).  General - No distress ENT - No sinus tenderness, no oral exudate, no LAN Cardiac - s1s2 regular, no murmur Chest - No wheeze/rales/dullness Back - No focal tenderness Abd - Soft, non-tender Ext - No edema Neuro - Normal strength Skin - No rashes Psych - normal mood, and behavior   Assessment/Plan:  Chronic cough 2nd to asthma, UACS, GERD >> improved.  Her worst time of year is in March.  Asthma. Plan: - continue advair, singulair and prn albuterol  Upper airway cough syndrome. She is reluctant to use nose sprays. Plan: - continue fexofenadine - might need additional allergy testing at some point  GERD. ? Of achalasia >> seen by GI 03/01/15. Plan: - she is followed by Dr. Arlyce DiceKaplan with [facility]  Psoriasis. Plan: - she recently resumed adalimumab   Dominique HellingVineet Latoiya Maradiaga, MD [facility] Pager:  [phone]

## 2015-03-04 NOTE — Patient Instructions (Signed)
Follow up in 6 months 

## 2015-03-04 NOTE — Progress Notes (Signed)
Stable to RR 

## 2015-03-04 NOTE — Op Note (Signed)
[facility] [address] [place], [id number]   ENDOSCOPY PROCEDURE REPORT  PATIENT: Dominique Evans, Dominique Evans  MR#: [id number] BIRTHDATE: 1966-05-24 , 48  yrs. old GENDER: female ENDOSCOPIST: Louis Meckelobert D Kaplan, MD REFERRED BY:  Guerry Bruinichard [name], M.D. PROCEDURE DATE:  03/04/2015 PROCEDURE:  EGD, diagnostic ASA CLASS:     Class II INDICATIONS:  dysphagia. MEDICATIONS: Monitored anesthesia care, Propofol 250 mg IV, and Simethicone 40mg  PO TOPICAL ANESTHETIC:  DESCRIPTION OF PROCEDURE: After the risks benefits and alternatives of the procedure were thoroughly explained, informed consent was obtained.  The LB [id number] endoscope was introduced through the mouth and advanced to the proximal jejunum , Without limitations.  The instrument was slowly withdrawn as the mucosa was fully examined.    The esophagus was diffusely enlarged.  There were marked areas of inflammation throughout the lower third of the esophagus.  The GE junction was widely patent.  The scope entered a gastric pouch. Patient is status post gastric bypass.  The gastrojejunostomy site and proximal jejunum were normal.  Retroflexion was not performed. The scope was then withdrawn from the patient and the procedure completed.  COMPLICATIONS: There were no immediate complications.  ENDOSCOPIC IMPRESSION: Th diffusely dilated esophagus with patent GE junction Esophagitis  RECOMMENDATIONS: high resolution esophageal manometry Continue dexilant  REPEAT EXAM:  eSigned:  Louis Meckelobert D Kaplan, MD 03/04/2015 3:59 PM    CC:

## 2015-03-04 NOTE — Patient Instructions (Addendum)
YOU HAD AN ENDOSCOPIC PROCEDURE TODAY AT THE [facility]:   Refer to the procedure report that was given to you for any specific questions about what was found during the examination.  If the procedure report does not answer your questions, please call your gastroenterologist to clarify.  If you requested that your care partner not be given the details of your procedure findings, then the procedure report has been included in a sealed envelope for you to review at your convenience later.  YOU SHOULD EXPECT: Some feelings of bloating in the abdomen. Passage of more gas than usual.  Walking can help get rid of the air that was put into your GI tract during the procedure and reduce the bloating. If you had a lower endoscopy (such as a colonoscopy or flexible sigmoidoscopy) you may notice spotting of blood in your stool or on the toilet paper. If you underwent a bowel prep for your procedure, you may not have a normal bowel movement for a few days.  Please Note:  You might notice some irritation and congestion in your nose or some drainage.  This is from the oxygen used during your procedure.  There is no need for concern and it should clear up in a day or so.  SYMPTOMS TO REPORT IMMEDIATELY:    Following upper endoscopy (EGD)  Vomiting of blood or coffee ground material  New chest pain or pain under the shoulder blades  Painful or persistently difficult swallowing  New shortness of breath  Fever of 100F or higher  Black, tarry-looking stools  For urgent or emergent issues, a gastroenterologist can be reached at any hour by calling [phone].   DIET: Your first meal following the procedure should be a small meal and then it is ok to progress to your normal diet. Heavy or fried foods are harder to digest and may make you feel nauseous or bloated.  Likewise, meals heavy in dairy and vegetables can increase bloating.  Drink plenty of fluids but you should avoid alcoholic beverages  for 24 hours.  ACTIVITY:  You should plan to take it easy for the rest of today and you should NOT DRIVE or use heavy machinery until tomorrow (because of the sedation medicines used during the test).    FOLLOW UP: Our staff will call the number listed on your records the next business day following your procedure to check on you and address any questions or concerns that you may have regarding the information given to you following your procedure. If we do not reach you, we will leave a message.  However, if you are feeling well and you are not experiencing any problems, there is no need to return our call.  We will assume that you have returned to your regular daily activities without incident.  If any biopsies were taken you will be contacted by phone or by letter within the next 1-3 weeks.  Please call us at [phone] if you have not heard about the biopsies in 3 weeks.    SIGNATURES/CONFIDENTIALITY: You and/or your care partner have signed paperwork which will be entered into your electronic medical record.  These signatures attest to the fact that that the information above on your After Visit Summary has been reviewed and is understood.  Full responsibility of the confidentiality of this discharge information lies with you and/or your care-partner.  Third floor staff will call you with the manometry schedule within the next few days.

## 2015-03-05 ENCOUNTER — Telehealth: Payer: Self-pay | Admitting: *Deleted

## 2015-03-05 ENCOUNTER — Telehealth: Payer: Self-pay

## 2015-03-05 NOTE — Telephone Encounter (Signed)
Spoke with the patient about her esophageal manometry. [facility] agrees [facility] 03/11/15 at 8:15 am for the test. She will go fasting and hold her Dexilant for 24 hours prior and the day of the test.

## 2015-03-05 NOTE — Telephone Encounter (Signed)
  Follow up Call-  Call back number 03/04/2015  Post procedure Call Back phone  # [phone]  Permission to leave phone message Yes     Patient questions:  Do you have a fever, pain , or abdominal swelling? No. Pain Score  0 *  Have you tolerated food without any problems? Yes.    Have you been able to return to your normal activities? Yes.    Do you have any questions about your discharge instructions: Diet   No. Medications  No. Follow up visit  No.  Do you have questions or concerns about your Care? No.  Actions: * If pain score is 4 or above: No action needed, pain <4.

## 2015-04-05 ENCOUNTER — Encounter (HOSPITAL_COMMUNITY): Payer: Self-pay

## 2015-04-05 ENCOUNTER — Emergency Department (HOSPITAL_COMMUNITY)
Admission: EM | Admit: 2015-04-05 | Discharge: 2015-04-05 | Disposition: A | Discharge status: Home / Self Care | Payer: Managed Care, Other (non HMO) | Attending: Emergency Medicine | Admitting: Emergency Medicine

## 2015-04-05 DIAGNOSIS — E785 Hyperlipidemia, unspecified: Secondary | ICD-10-CM | POA: Insufficient documentation

## 2015-04-05 DIAGNOSIS — M545 Low back pain: Secondary | ICD-10-CM | POA: Insufficient documentation

## 2015-04-05 DIAGNOSIS — E669 Obesity, unspecified: Secondary | ICD-10-CM | POA: Insufficient documentation

## 2015-04-05 DIAGNOSIS — Z87442 Personal history of urinary calculi: Secondary | ICD-10-CM | POA: Insufficient documentation

## 2015-04-05 DIAGNOSIS — E119 Type 2 diabetes mellitus without complications: Secondary | ICD-10-CM | POA: Diagnosis not present

## 2015-04-05 DIAGNOSIS — J45909 Unspecified asthma, uncomplicated: Secondary | ICD-10-CM | POA: Diagnosis not present

## 2015-04-05 DIAGNOSIS — Z87891 Personal history of nicotine dependence: Secondary | ICD-10-CM | POA: Diagnosis not present

## 2015-04-05 DIAGNOSIS — Z905 Acquired absence of kidney: Secondary | ICD-10-CM | POA: Diagnosis not present

## 2015-04-05 DIAGNOSIS — Z872 Personal history of diseases of the skin and subcutaneous tissue: Secondary | ICD-10-CM | POA: Diagnosis not present

## 2015-04-05 DIAGNOSIS — Z3202 Encounter for pregnancy test, result negative: Secondary | ICD-10-CM | POA: Insufficient documentation

## 2015-04-05 DIAGNOSIS — F329 Major depressive disorder, single episode, unspecified: Secondary | ICD-10-CM | POA: Insufficient documentation

## 2015-04-05 DIAGNOSIS — G629 Polyneuropathy, unspecified: Secondary | ICD-10-CM | POA: Insufficient documentation

## 2015-04-05 DIAGNOSIS — M549 Dorsalgia, unspecified: Secondary | ICD-10-CM

## 2015-04-05 DIAGNOSIS — G8929 Other chronic pain: Secondary | ICD-10-CM | POA: Diagnosis not present

## 2015-04-05 DIAGNOSIS — R200 Anesthesia of skin: Secondary | ICD-10-CM | POA: Insufficient documentation

## 2015-04-05 DIAGNOSIS — K219 Gastro-esophageal reflux disease without esophagitis: Secondary | ICD-10-CM | POA: Diagnosis not present

## 2015-04-05 DIAGNOSIS — I10 Essential (primary) hypertension: Secondary | ICD-10-CM | POA: Diagnosis not present

## 2015-04-05 DIAGNOSIS — Z79899 Other long term (current) drug therapy: Secondary | ICD-10-CM | POA: Diagnosis not present

## 2015-04-05 DIAGNOSIS — Z862 Personal history of diseases of the blood and blood-forming organs and certain disorders involving the immune mechanism: Secondary | ICD-10-CM | POA: Insufficient documentation

## 2015-04-05 DIAGNOSIS — Z8774 Personal history of (corrected) congenital malformations of heart and circulatory system: Secondary | ICD-10-CM | POA: Diagnosis not present

## 2015-04-05 LAB — POC URINE PREG, ED: Preg Test, Ur: NEGATIVE

## 2015-04-05 MED ORDER — DIAZEPAM 5 MG PO TABS
5.0000 mg | ORAL_TABLET | Freq: Once | ORAL | Status: AC
  Administered 2015-04-05: 5 mg via ORAL
  Filled 2015-04-05: qty 1

## 2015-04-05 MED ORDER — OXYCODONE-ACETAMINOPHEN 5-325 MG PO TABS
2.0000 | ORAL_TABLET | Freq: Once | ORAL | Status: AC
  Administered 2015-04-05: 2 via ORAL
  Filled 2015-04-05: qty 2

## 2015-04-05 MED ORDER — KETOROLAC TROMETHAMINE 60 MG/2ML IM SOLN
60.0000 mg | Freq: Once | INTRAMUSCULAR | Status: AC
  Administered 2015-04-05: 60 mg via INTRAMUSCULAR
  Filled 2015-04-05: qty 2

## 2015-04-05 NOTE — ED Notes (Signed)
Pt c/o chronic back pain x 3 years increasing x 5 days and increasing L leg numbness x "a while."  Pain score 9/10.  Pt reports that her pain clinic took her off the oxycodone in March and began administering injections and prescribed Cymbalta.  Sts "they haven't helped.  That sh*t don't work."  Pt is followed by a pain clinic.  Sts "my doctor is out of town.  My neurologist said if I'm in pain next month, they are going to order a CT.  Hx of multiple back surgeries.

## 2015-04-05 NOTE — Discharge Instructions (Signed)

## 2015-04-05 NOTE — ED Provider Notes (Signed)
CSN: [id number]     Arrival date & time 04/05/15  1347 History   First MD Initiated Contact with Patient 04/05/15 1810     Chief Complaint  Patient presents with  . Back Pain  . Numbness     (Consider location/radiation/quality/duration/timing/severity/associated sxs/prior Treatment) HPI Dominique Evans is a 49 year old female with past medical history of chronic back pain, currently is being seen by pain management who presents the ER complaining of back pain. Patient states on Sunday she is vacuuming and doing chores around her house, and noticed worsening of her chronic pain. Patient reports lower bilateral back pain with radiation to her left leg with associated tingling sensation in her left leg. Patient states her signs and symptoms are consistent with an identical to chronic pain she has been experiencing over the past 3 years. Patient states she sees neurosurgery and pain management for same. Patient denies any injury to her back. Denies any new symptoms associated with her back pain. Patient denies numbness, weakness, saddle anesthesia, bowel/bladder incontinence/retention, history of cancer or IV drug use.  Past Medical History  Diagnosis Date  . Unspecified essential hypertension   . GERD (gastroesophageal reflux disease)   . 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 Chronic constipation   . Obesity   . Asthma     PFT 08/12/07: FEV1 2.97 (104%), FEV1% 84, TLC 4.89 (90%), DLCO 84%, +BD  . Allergic rhinitis   . Panic attacks   . Hydatidiform mole   . Visual disturbance   . PONV (postoperative nausea and vomiting)   . Anemia   . Blood transfusion     hx of 2002   . 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 Depression   . Psoriasis   . Hernia     LLQ (after kidney removed)  . Diabetes mellitus     pre gastric bypass  . Cough 03/23/13  . Peripheral neuropathy     bilateral feet  . History of stress test    exercise stress test, ? where it was done,  about 6 yrs. ago,  results- negative   . H/O hiatal hernia   . Arthritis     spondylolisthesis- lumbar region  . Status post dilation of esophageal narrowing   . Hypertension   . Hyperlipidemia    Past Surgical History  Procedure Laterality Date  . Nephrectomy  2002    left  . Elbow fracture surgery      left  . Shoulder arthroscopy      right shoulder  . Laparoscopic gastric banding  02/2006    w/ truncal vagotomy  . Patent ductus arterious repair    . Lap band removal     . 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 Lumbar laminectomy/decompression microdiscectomy Left 11/30/2012    Procedure: MICRO LUMBAR DECOMPRESSION L4-5 ON THE LEFT;  Surgeon: Johnn Hai, MD;  Location: [facility];  Service: Orthopedics;  Laterality: Left;  MICRO LUMBAR DECOMPRESSION L4-5 ON THE LEFT  . Lumbar laminectomy/decompression microdiscectomy N/A 03/30/2013    Procedure: REDO MICRO LUMBAR DECOMPRESSION L4-5;  Surgeon: Johnn Hai, MD;  Location: [facility];  Service: Orthopedics;  Laterality: N/A;  . Maximum access (mas)posterior lumbar interbody fusion (plif) 1 level N/A 05/23/2014  Procedure: FOR MAXIMUM ACCESS (MAS) POSTERIOR LUMBAR INTERBODY FUSION (PLIF) Lumbar Four-Five;  Surgeon: Eustace Moore, MD;  Location: [facility] [place] [facility];  Service: Neurosurgery;  Laterality: N/A;  FOR MAXIMUM ACCESS (MAS) POSTERIOR LUMBAR INTERBODY FUSION (PLIF) Lumbar Four-Five   Family History  Problem Relation Age of Onset  . Hypertension Father   . Skin cancer Father   . Heart disease Father     before age 80  . Heart attack Father   . Hyperlipidemia Father   . Diabetes Mother   . Hypertension Mother   . Skin cancer Mother   . Cancer Mother     Melanoma,Breast  . Heart disease Mother   . Hyperlipidemia Mother   . Hypertension  Sister   . Heart disease Sister     before age 33  . Heart attack Sister   . Hyperlipidemia Sister   . Breast cancer Maternal Aunt   . Lung cancer Mother   . Brain cancer Mother   . Colon cancer Neg Hx   . Colon polyps Neg Hx   . Esophageal cancer Neg Hx   . Gallbladder disease Neg Hx    History  Substance Use Topics  . Smoking status: Former Smoker -- 2.00 packs/day    Types: Cigarettes    Quit date: 10/19/1982  . Smokeless tobacco: Never Used  . Alcohol Use: No   OB History    No data available     Review of Systems  Constitutional: Negative for fever.  HENT: Negative for trouble swallowing.   Eyes: Negative for visual disturbance.  Respiratory: Negative for shortness of breath.   Cardiovascular: Negative for chest pain.  Gastrointestinal: Negative for nausea, vomiting and abdominal pain.  Genitourinary: Negative for dysuria.  Musculoskeletal: Positive for back pain. Negative for neck pain.  Skin: Negative for rash.  Neurological: Negative for dizziness, weakness and numbness.  Psychiatric/Behavioral: Negative.     Allergies  Penicillins; Sulfonamide derivatives; Ivp dye; and Ciprofloxacin  Home Medications   Prior to Admission medications   Medication Sig Start Date End Date Taking? Authorizing Provider  acetaminophen (TYLENOL) 650 MG CR tablet Take 650-1,300 mg by mouth 3 (three) times daily. Takes 666m twice a day and then takes 13043mevery night at bedtime   Yes Historical Provider, MD  adalimumab (HUMIRA PEN) 40 MG/0.8ML injection Inject 40 mg into the skin every 14 (fourteen) days.    Yes Historical Provider, MD  albuterol (ACCUNEB) 1.25 MG/3ML nebulizer solution Take 3 mLs by nebulization every 4 (four) hours as needed for wheezing or shortness of breath.  11/20/14  Yes Historical Provider, MD  albuterol (PROVENTIL HFA;VENTOLIN HFA) 108 (90 BASE) MCG/ACT inhaler Inhale 1-2 puffs into the lungs every 4 (four) hours as needed for wheezing or shortness of  breath.   Yes Historical Provider, MD  amitriptyline (ELAVIL) 25 MG tablet Take 50 mg by mouth at bedtime.    Yes Historical Provider, MD  Apoaequorin (PREVAGEN) 10 MG CAPS Take 10 mg by mouth daily.   Yes Historical Provider, MD  CALCIUM-VITAMIN D PO Take 1 tablet by mouth 2 (two) times daily.    Yes Historical Provider, MD  Clobetasol Propionate (TEMOVATE) 0.05 % external spray Apply 1 application topically See admin instructions. As directed when needed   Yes Historical Provider, MD  cyanocobalamin 500 MCG tablet Take 500 mcg by mouth daily.   Yes Historical Provider, MD  dexlansoprazole (DEXILANT) 60 MG capsule Take 60 mg by mouth daily after breakfast.    Yes  Historical Provider, MD  DULoxetine (CYMBALTA) 60 MG capsule Take 120 mg by mouth at bedtime.   Yes Historical Provider, MD  ferrous sulfate 325 (65 FE) MG tablet Take 325 mg by mouth daily with breakfast.   Yes Historical Provider, MD  Fluticasone-Salmeterol (ADVAIR) 250-50 MCG/DOSE AEPB Inhale 1 puff into the lungs 2 (two) times daily.   Yes Historical Provider, MD  furosemide (LASIX) 20 MG tablet Take 20 mg by mouth every morning.    Yes Historical Provider, MD  gabapentin (NEURONTIN) 300 MG capsule Take 300 mg by mouth 5 (five) times daily. Takes 647m every night at bedtime   Yes Historical Provider, MD  losartan (COZAAR) 50 MG tablet Take 50 mg by mouth every morning.  09/21/11  Yes Historical Provider, MD  methocarbamol (ROBAXIN) 500 MG tablet Take 1 tablet (500 mg total) by mouth every 6 (six) hours as needed for muscle spasms. Patient taking differently: Take 500 mg by mouth 3 (three) times daily.  05/25/14  Yes DEustace Moore MD  montelukast (SINGULAIR) 10 MG tablet Take 10 mg by mouth daily with breakfast.    Yes Historical Provider, MD  Multiple Vitamins-Minerals (MULTIVITAMIN PO) Take 1 tablet by mouth daily.    Yes Historical Provider, MD  norethindrone-ethinyl estradiol (JUNEL FE,GILDESS FE,LOESTRIN FE) 1-20 MG-MCG tablet Take  1 tablet by mouth at bedtime.    Yes Historical Provider, MD  polyethylene glycol (MIRALAX) powder Take 17 g by mouth daily.    Yes Historical Provider, MD  rosuvastatin (CRESTOR) 10 MG tablet Take 5 mg by mouth every morning.    Yes Historical Provider, MD  verapamil (COVERA HS) 240 MG (CO) 24 hr tablet Take 240 mg by mouth every morning.    Yes Historical Provider, MD  Na Sulfate-K Sulfate-Mg Sulf (SUPREP BOWEL PREP) SOLN Take 1 kit by mouth once. Patient not taking: Reported on 04/05/2015 03/01/15   RInda Castle MD   BP 145/79 mmHg  Pulse 97  Temp(Src) 98.1 F (36.7 C) (Oral)  Resp 16  SpO2 95% Physical Exam  Constitutional: She is oriented to person, place, and time. She appears well-developed and well-nourished. No distress.  HENT:  Head: Normocephalic and atraumatic.  Mouth/Throat: Oropharynx is clear and moist. No oropharyngeal exudate.  Eyes: Right eye exhibits no discharge. Left eye exhibits no discharge. No scleral icterus.  Neck: Normal range of motion.  Cardiovascular: Normal rate, regular rhythm and normal heart sounds.   No murmur heard. Pulmonary/Chest: Effort normal and breath sounds normal. No respiratory distress.  Abdominal: Soft. There is no tenderness.  Musculoskeletal: Normal range of motion. She exhibits no edema or tenderness.  Neurological: She is alert and oriented to person, place, and time. She has normal strength. No cranial nerve deficit or sensory deficit. She displays a negative Romberg sign. Coordination and gait normal. GCS eye subscore is 4. GCS verbal subscore is 5. GCS motor subscore is 6.  Reflex Scores:      Patellar reflexes are 2+ on the right side and 2+ on the left side.      Achilles reflexes are 2+ on the right side and 2+ on the left side. Patient fully alert, answering questions appropriately in full, clear sentences. Cranial nerves II through XII grossly intact. Motor strength 5 out of 5 in all major muscle groups of upper and lower  extremities. Distal sensation intact.   Skin: Skin is warm and dry. No rash noted. She is not diaphoretic.  Psychiatric: She has a normal mood  and affect.  Nursing note and vitals reviewed.   ED Course  Procedures (including critical care time) Labs Review Labs Reviewed  POC URINE PREG, ED    Imaging Review No results found.   EKG Interpretation None      MDM   Final diagnoses:  Chronic back pain    Patient with back pain consistent with flareup of her chronic back pain. No injury, no new signs or symptoms consistent with alternative etiology than patient's chronic pain.  No neurological deficits and normal neuro exam.  Patient can walk but states is painful.  No loss of bowel or bladder control.  No concern for cauda equina.  No fever, night sweats, weight loss, h/o cancer, IVDU.  Signs and symptoms improved after symptomatic therapy. Patient able to ambulate well. Patient afebrile, hemodynamically stable and in no acute distress. Patient discharged to follow up with PCP and pain management clinic. Return precautions discussed, patient verbalizes understanding and agreement of this plan.  BP 145/79 mmHg  Pulse 97  Temp(Src) 98.1 F (36.7 C) (Oral)  Resp 16  SpO2 95%  Signed,  Dahlia Bailiff, PA-C 1:44 AM       Dahlia Bailiff, PA-C 04/06/15 7672  Ernestina Patches, MD 04/06/15 1048

## 2015-04-08 ENCOUNTER — Encounter (HOSPITAL_COMMUNITY)
Admission: RE | Disposition: A | Discharge status: Home / Self Care | Payer: Self-pay | Source: Ambulatory Visit | Attending: Gastroenterology

## 2015-04-08 ENCOUNTER — Ambulatory Visit (HOSPITAL_COMMUNITY)
Admission: RE | Admit: 2015-04-08 | Discharge: 2015-04-08 | Disposition: A | Discharge status: Home / Self Care | Payer: Managed Care, Other (non HMO) | Source: Ambulatory Visit | Attending: Gastroenterology | Admitting: Gastroenterology

## 2015-04-08 DIAGNOSIS — R1310 Dysphagia, unspecified: Secondary | ICD-10-CM | POA: Insufficient documentation

## 2015-04-08 HISTORY — PX: ESOPHAGEAL MANOMETRY: SHX5429

## 2015-04-08 SURGERY — MANOMETRY, ESOPHAGUS

## 2015-04-08 MED ORDER — LIDOCAINE VISCOUS 2 % MT SOLN
OROMUCOSAL | Status: AC
  Filled 2015-04-08: qty 15

## 2015-04-08 SURGICAL SUPPLY — 2 items
FACESHIELD LNG OPTICON STERILE (SAFETY) IMPLANT
GLOVE BIO SURGEON STRL SZ8 (GLOVE) ×4 IMPLANT

## 2015-04-09 ENCOUNTER — Encounter (HOSPITAL_COMMUNITY): Payer: Self-pay | Admitting: Gastroenterology

## 2015-04-11 ENCOUNTER — Other Ambulatory Visit: Payer: Self-pay | Admitting: Neurological Surgery

## 2015-04-11 DIAGNOSIS — M47816 Spondylosis without myelopathy or radiculopathy, lumbar region: Secondary | ICD-10-CM

## 2015-04-15 ENCOUNTER — Other Ambulatory Visit: Payer: Self-pay

## 2015-04-17 ENCOUNTER — Ambulatory Visit
Admission: RE | Admit: 2015-04-17 | Discharge: 2015-04-17 | Disposition: A | Discharge status: Home / Self Care | Payer: Managed Care, Other (non HMO) | Source: Ambulatory Visit | Attending: Neurological Surgery | Admitting: Neurological Surgery

## 2015-04-17 DIAGNOSIS — M47816 Spondylosis without myelopathy or radiculopathy, lumbar region: Secondary | ICD-10-CM

## 2015-04-18 NOTE — Op Note (Signed)
Patient: Dhana, Totton   [id number] Gender: Female Physician: Dr. Stan Head   DOB / Age: 04/29/1966 Operator: Anthony [facility]   Height: 5 ft 6 in Referring Physician:    Procedure: EMZ Examination Date: 04/08/2015    Swallow Composite (mean of 10 swallows) Resting Pressure Profile & Anatomy     Basal Pressures* LES, respiratory mean(mmHg) 31.1 (13-43) UES mean(mmHg) 94.6 (34-104)  Anatomy* LES proximal(cm) 36.2 LES intraabdominal(cm) 0.0 Esophageal length(cm) 18.9 Hiatal hernia Yes, 3.3    Motility* Distal contr. integral(mmHg-cm-s) 1067.3 (808 180 4599) Distal contr. int. (highest)(mmHg-cm-s) 1459.6 Incomplete bolus clearance(%) 0  Residual Pressures* LES (mean)(mmHg) 15.0 (<15.0) UES (mean)(mmHg) 9.2 (<12.0)  *Notes. Motility values are mean among swallows; Normal values in (xxx.x):  Simultaneous contractions: Velocity > 8.0 cm/s; eSlv: eSleeve; 3SN, IRP, DCI, IBP - See manual definitions  Lower Esophageal Sphincter Region  Normal Esophageal Motility  Normal  Landmarks   Number of swallows evaluated 10       Proximal LES (from nares)(cm) 36.2  High Resolution Parameters        LES length(cm) 3.2 2.7-4.8     Distal contractile integral(mean)(mmHg-cm-s) 1067.3 808 180 4599      Esophageal length (LES-UES centers)(cm) 18.9      Distal contractile integral(highest)(mmHg-cm-s) 1459.6       Intraabdominal LES length(cm) 0.0      Contractile front velocity(cm/s) 8.3 <9.0      Hiatal hernia? Yes, 3.3  [place] Classification    LES Pressures       Distal latency 5.9       Pressure meas. method eSleeve,IRP      % failed ([place] Classification) 30       Basal (respiratory min.)(mmHg) 22.3 4.8-32.0     % panesophageal pressurization 0       Basal (respiratory mean)(mmHg) 31.1 13-43     % premature contraction 0       Residual (mean)(mmHg) 15.0 <15.0     % rapid contraction 30          % large breaks 0          % small breaks 0      Impedance analysis           Incomplete bolus  clearance(%) 0          Upper Esophageal Sphincter  Normal Pharyngeal / UES Motility  Normal  Mean basal pressure(mmHg) 94.6 34-104 No. swallows evaluated 10   Mean residual pressure(mmHg) 9.2 <12.0 Evaluated @ 3.0 & N/A above UES           Mean peak pressure(mmHg) 10.3       [place] Classification Findings*  EGJ: Outflow obstruction         Mean IRP (15.0 mmHg) is greater than 15 mmHg Esophageal body: Some instances of intact or weak peristalsis Finding: EGJ Outflow Obstruction * Findings are based on published [place] Classification scheme and are only intended to serve as a guide for patient diagnosis   Procedure  After confirmation of potential allergies, a topical analgesic was used to numb the nares followed by trans-nasal insertion of a High Resolution Manometry Catheter. Pressure bands of both UES and LES were observed on the color contour. Patient instructed to take deep breath to verify placement of catheter; diaphragmatic pinch noted on inspiration. Patient was assisted to supine position and catheter was stabilized. Patient encouraged to relax while acclimating to catheter for approximately 5 minutes. A 30 second baseline pressure was obtained to identify the UES and LES followed  by a series of wet swallows using 5 ccs room temperature water to assess esophageal motility. At the conclusion of the procedure; the catheter was removed.   Indications  Dysphagia   Interpretation / Findings  Weak and some ineffective peristalsis with mild EG Junction outflow obstruction.  Impressions  Nonspecific esophageal motility disorder E sign Iva Booparl E. Carlena Ruybal, MD, Clementeen GrahamFACG 6/30.2017 [phone]

## 2015-04-19 ENCOUNTER — Telehealth: Payer: Self-pay

## 2015-04-19 NOTE — Telephone Encounter (Signed)
-----   Message from Louis Meckelobert D Kaplan, MD sent at 04/19/2015 10:20 AM EDT ----- Esophageal manometry shows a nonspecific motility disorder.  I think it's worthwhile trying a Botox injection.  Please schedule EGD with Botox

## 2015-04-19 NOTE — Telephone Encounter (Signed)
No answer. Left a message for the patient to call back.

## 2015-04-19 NOTE — Telephone Encounter (Signed)
Patient cannot get this done until August. She also only want to be sedated once and requests the EGD with Botox and her screening colonoscopy be done on the same date. Okay with you?

## 2015-04-25 NOTE — Telephone Encounter (Signed)
ok 

## 2015-04-26 ENCOUNTER — Other Ambulatory Visit: Payer: Self-pay

## 2015-04-26 NOTE — Telephone Encounter (Signed)
Procedure date changed to 06/07/15. [facility]. Letter mailed per patient request.

## 2015-05-03 ENCOUNTER — Encounter: Payer: Self-pay | Admitting: Gastroenterology

## 2015-05-03 NOTE — Telephone Encounter (Signed)
error 

## 2015-05-17 ENCOUNTER — Encounter: Payer: Managed Care, Other (non HMO) | Admitting: Gastroenterology

## 2015-05-21 ENCOUNTER — Ambulatory Visit (HOSPITAL_COMMUNITY): Admit: 2015-05-21 | Payer: Self-pay | Admitting: Gastroenterology

## 2015-05-21 ENCOUNTER — Encounter (HOSPITAL_COMMUNITY): Payer: Self-pay

## 2015-05-21 SURGERY — COLONOSCOPY
Anesthesia: Moderate Sedation

## 2015-06-04 ENCOUNTER — Encounter (HOSPITAL_COMMUNITY): Payer: Self-pay | Admitting: *Deleted

## 2015-06-04 ENCOUNTER — Other Ambulatory Visit: Payer: Self-pay

## 2015-06-04 DIAGNOSIS — Z1211 Encounter for screening for malignant neoplasm of colon: Secondary | ICD-10-CM

## 2015-06-04 DIAGNOSIS — R1314 Dysphagia, pharyngoesophageal phase: Secondary | ICD-10-CM

## 2015-06-07 ENCOUNTER — Ambulatory Visit (HOSPITAL_COMMUNITY): Payer: Managed Care, Other (non HMO) | Admitting: Anesthesiology

## 2015-06-07 ENCOUNTER — Ambulatory Visit (HOSPITAL_COMMUNITY)
Admission: RE | Admit: 2015-06-07 | Discharge: 2015-06-07 | Disposition: A | Discharge status: Home / Self Care | Payer: Managed Care, Other (non HMO) | Source: Ambulatory Visit | Attending: Gastroenterology | Admitting: Gastroenterology

## 2015-06-07 ENCOUNTER — Encounter (HOSPITAL_COMMUNITY)
Admission: RE | Disposition: A | Discharge status: Home / Self Care | Payer: Self-pay | Source: Ambulatory Visit | Attending: Gastroenterology

## 2015-06-07 ENCOUNTER — Encounter (HOSPITAL_COMMUNITY): Payer: Self-pay | Admitting: *Deleted

## 2015-06-07 DIAGNOSIS — K449 Diaphragmatic hernia without obstruction or gangrene: Secondary | ICD-10-CM | POA: Insufficient documentation

## 2015-06-07 DIAGNOSIS — K224 Dyskinesia of esophagus: Secondary | ICD-10-CM | POA: Diagnosis present

## 2015-06-07 DIAGNOSIS — K219 Gastro-esophageal reflux disease without esophagitis: Secondary | ICD-10-CM | POA: Diagnosis not present

## 2015-06-07 DIAGNOSIS — E1140 Type 2 diabetes mellitus with diabetic neuropathy, unspecified: Secondary | ICD-10-CM | POA: Insufficient documentation

## 2015-06-07 DIAGNOSIS — Z905 Acquired absence of kidney: Secondary | ICD-10-CM | POA: Diagnosis not present

## 2015-06-07 DIAGNOSIS — E1151 Type 2 diabetes mellitus with diabetic peripheral angiopathy without gangrene: Secondary | ICD-10-CM | POA: Diagnosis not present

## 2015-06-07 DIAGNOSIS — Z87891 Personal history of nicotine dependence: Secondary | ICD-10-CM | POA: Insufficient documentation

## 2015-06-07 DIAGNOSIS — E669 Obesity, unspecified: Secondary | ICD-10-CM | POA: Diagnosis not present

## 2015-06-07 DIAGNOSIS — G709 Myoneural disorder, unspecified: Secondary | ICD-10-CM | POA: Insufficient documentation

## 2015-06-07 DIAGNOSIS — K5900 Constipation, unspecified: Secondary | ICD-10-CM | POA: Insufficient documentation

## 2015-06-07 DIAGNOSIS — J45909 Unspecified asthma, uncomplicated: Secondary | ICD-10-CM | POA: Diagnosis not present

## 2015-06-07 DIAGNOSIS — M1990 Unspecified osteoarthritis, unspecified site: Secondary | ICD-10-CM | POA: Diagnosis not present

## 2015-06-07 DIAGNOSIS — I10 Essential (primary) hypertension: Secondary | ICD-10-CM | POA: Insufficient documentation

## 2015-06-07 DIAGNOSIS — Z79899 Other long term (current) drug therapy: Secondary | ICD-10-CM | POA: Insufficient documentation

## 2015-06-07 DIAGNOSIS — M4316 Spondylolisthesis, lumbar region: Secondary | ICD-10-CM | POA: Insufficient documentation

## 2015-06-07 DIAGNOSIS — E785 Hyperlipidemia, unspecified: Secondary | ICD-10-CM | POA: Diagnosis not present

## 2015-06-07 DIAGNOSIS — G4730 Sleep apnea, unspecified: Secondary | ICD-10-CM | POA: Insufficient documentation

## 2015-06-07 DIAGNOSIS — Z6835 Body mass index (BMI) 35.0-35.9, adult: Secondary | ICD-10-CM | POA: Insufficient documentation

## 2015-06-07 DIAGNOSIS — Z9884 Bariatric surgery status: Secondary | ICD-10-CM | POA: Insufficient documentation

## 2015-06-07 DIAGNOSIS — I739 Peripheral vascular disease, unspecified: Secondary | ICD-10-CM | POA: Insufficient documentation

## 2015-06-07 DIAGNOSIS — Z86010 Personal history of colonic polyps: Secondary | ICD-10-CM

## 2015-06-07 HISTORY — DX: Dyskinesia of esophagus: K22.4

## 2015-06-07 HISTORY — PX: ESOPHAGOGASTRODUODENOSCOPY (EGD) WITH PROPOFOL: SHX5813

## 2015-06-07 HISTORY — PX: COLONOSCOPY WITH PROPOFOL: SHX5780

## 2015-06-07 HISTORY — PX: BOTOX INJECTION: SHX5754

## 2015-06-07 LAB — GLUCOSE, CAPILLARY: Glucose-Capillary: 97 mg/dL (ref 65–99)

## 2015-06-07 SURGERY — ESOPHAGOGASTRODUODENOSCOPY (EGD) WITH PROPOFOL
Anesthesia: Monitor Anesthesia Care

## 2015-06-07 MED ORDER — PROPOFOL 10 MG/ML IV BOLUS
INTRAVENOUS | Status: AC
  Filled 2015-06-07: qty 20

## 2015-06-07 MED ORDER — SODIUM CHLORIDE 0.9 % IV SOLN
INTRAVENOUS | Status: DC
Start: 2015-06-07 — End: 2015-06-07

## 2015-06-07 MED ORDER — SODIUM CHLORIDE 0.9 % IJ SOLN
INTRAMUSCULAR | Status: DC | PRN
  Administered 2015-06-07: 4 mL via SUBMUCOSAL

## 2015-06-07 MED ORDER — LIDOCAINE HCL (CARDIAC) 20 MG/ML IV SOLN
INTRAVENOUS | Status: AC
  Filled 2015-06-07: qty 5

## 2015-06-07 MED ORDER — PROPOFOL 10 MG/ML IV BOLUS
INTRAVENOUS | Status: DC | PRN
  Administered 2015-06-07 (×2): 50 mg via INTRAVENOUS
  Administered 2015-06-07: 20 mg via INTRAVENOUS
  Administered 2015-06-07 (×2): 50 mg via INTRAVENOUS
  Administered 2015-06-07 (×2): 20 mg via INTRAVENOUS
  Administered 2015-06-07: 30 mg via INTRAVENOUS
  Administered 2015-06-07 (×2): 50 mg via INTRAVENOUS
  Administered 2015-06-07: 30 mg via INTRAVENOUS
  Administered 2015-06-07: 50 mg via INTRAVENOUS

## 2015-06-07 MED ORDER — ONDANSETRON HCL 4 MG/2ML IJ SOLN: 4.0000 mg | Freq: Once | INTRAMUSCULAR | Status: DC | PRN

## 2015-06-07 MED ORDER — SODIUM CHLORIDE 0.9 % IV SOLN: INTRAVENOUS | Status: DC

## 2015-06-07 MED ORDER — LIDOCAINE HCL (CARDIAC) 20 MG/ML IV SOLN
INTRAVENOUS | Status: DC | PRN
  Administered 2015-06-07: 50 mg via INTRAVENOUS

## 2015-06-07 MED ORDER — FENTANYL CITRATE (PF) 100 MCG/2ML IJ SOLN: 25.0000 ug | INTRAMUSCULAR | Status: DC | PRN

## 2015-06-07 MED ORDER — SODIUM CHLORIDE 0.9 % IJ SOLN
100.0000 [IU] | Freq: Once | INTRAMUSCULAR | Status: DC
  Filled 2015-06-07: qty 100

## 2015-06-07 MED ORDER — SODIUM CHLORIDE 0.9 % IV SOLN
INTRAVENOUS | Status: DC
  Administered 2015-06-07: 500 mL via INTRAVENOUS

## 2015-06-07 SURGICAL SUPPLY — 25 items

## 2015-06-07 NOTE — Discharge Instructions (Signed)
Colonoscopy, Care After °Refer to this sheet in the next few weeks. These instructions provide you with information on caring for yourself after your procedure. Your health care provider may also give you more specific instructions. Your treatment has been planned according to current medical practices, but problems sometimes occur. Call your health care provider if you have any problems or questions after your procedure. °WHAT TO EXPECT AFTER THE PROCEDURE  °After your procedure, it is typical to have the following: °· A small amount of blood in your stool. °· Moderate amounts of gas and mild abdominal cramping or bloating. °HOME CARE INSTRUCTIONS °· Do not drive, operate machinery, or sign important documents for 24 hours. °· You may shower and resume your regular physical activities, but move at a slower pace for the first 24 hours. °· Take frequent rest periods for the first 24 hours. °· Walk around or put a warm pack on your abdomen to help reduce abdominal cramping and bloating. °· Drink enough fluids to keep your urine clear or pale yellow. °· You may resume your normal diet as instructed by your health care provider. Avoid heavy or fried foods that are hard to digest. °· Avoid drinking alcohol for 24 hours or as instructed by your health care provider. °· Only take over-the-counter or prescription medicines as directed by your health care provider. °· If a tissue sample (biopsy) was taken during your procedure: °¨ Do not take aspirin or blood thinners for 7 days, or as instructed by your health care provider. °¨ Do not drink alcohol for 7 days, or as instructed by your health care provider. °¨ Eat soft foods for the first 24 hours. °SEEK MEDICAL CARE IF: °You have persistent spotting of blood in your stool 2-3 days after the procedure. °SEEK IMMEDIATE MEDICAL CARE IF: °· You have more than a small spotting of blood in your stool. °· You pass large blood clots in your stool. °· Your abdomen is swollen  (distended). °· You have nausea or vomiting. °· You have a fever. °· You have increasing abdominal pain that is not relieved with medicine. °Document Released: 05/19/2004 Document Revised: 07/26/2013 Document Reviewed: 06/12/2013 °ExitCare® Patient Information ©2015 [employer]. This information is not intended to replace advice given to you by your health care provider. Make sure you discuss any questions you have with your health care provider. °Esophagogastroduodenoscopy °Care After °Refer to this sheet in the next few weeks. These instructions provide you with information on caring for yourself after your procedure. Your caregiver may also give you more specific instructions. Your treatment has been planned according to current medical practices, but problems sometimes occur. Call your caregiver if you have any problems or questions after your procedure.  °HOME CARE INSTRUCTIONS °· Do not eat or drink anything until the numbing medicine (local anesthetic) has worn off and your gag reflex has returned. You will know that the local anesthetic has worn off when you can swallow comfortably. °· Do not drive for 12 hours after the procedure or as directed by your caregiver. °· Only take medicines as directed by your caregiver. °SEEK MEDICAL CARE IF:  °· You cannot stop coughing. °· You are not urinating at all or less than usual. °SEEK IMMEDIATE MEDICAL CARE IF: °· You have difficulty swallowing. °· You cannot eat or drink. °· You have worsening throat or chest pain. °· You have dizziness, lightheadedness, or you faint. °· You have nausea or vomiting. °· You have chills. °· You have a   fever. °· You have severe abdominal pain. °· You have black, tarry, or bloody stools. °Document Released: 09/21/2012 Document Reviewed: 09/21/2012 °ExitCare® Patient Information ©2015 [employer]. This information is not intended to replace advice given to you by your health care provider. Make sure you discuss any questions you have  with your health care provider. °Monitored Anesthesia Care °Monitored anesthesia care is an anesthesia service for a medical procedure. Anesthesia is the loss of the ability to feel pain. It is produced by medicines called anesthetics. It may affect a small area of your body (local anesthesia), a large area of your body (regional anesthesia), or your entire body (general anesthesia). The need for monitored anesthesia care depends your procedure, your condition, and the potential need for regional or general anesthesia. It is often provided during procedures where:  °· General anesthesia may be needed if there are complications. This is because you need special care when you are under general anesthesia.   °· You will be under local or regional anesthesia. This is so that you are able to have higher levels of anesthesia if needed.   °· You will receive calming medicines (sedatives). This is especially the case if sedatives are given to put you in a semi-conscious state of relaxation (deep sedation). This is because the amount of sedative needed to produce this state can be hard to predict. Too much of a sedative can produce general anesthesia. °Monitored anesthesia care is performed by one or more health care providers who have special training in all types of anesthesia. You will need to meet with these health care providers before your procedure. During this meeting, they will ask you about your medical history. They will also give you instructions to follow. (For example, you will need to stop eating and drinking before your procedure. You may also need to stop or change medicines you are taking.) During your procedure, your health care providers will stay with you. They will:  °· Watch your condition. This includes watching your blood pressure, breathing, and level of pain.   °· Diagnose and treat problems that occur.   °· Give medicines if they are needed. These may include calming medicines (sedatives) and  anesthetics.   °· Make sure you are comfortable.   °Having monitored anesthesia care does not necessarily mean that you will be under anesthesia. It does mean that your health care providers will be able to manage anesthesia if you need it or if it occurs. It also means that you will be able to have a different type of anesthesia than you are having if you need it. When your procedure is complete, your health care providers will continue to watch your condition. They will make sure any medicines wear off before you are allowed to go home.  °Document Released: 07/01/2005 Document Revised: 02/19/2014 Document Reviewed: 11/16/2012 °ExitCare® Patient Information ©2015 [employer]. This information is not intended to replace advice given to you by your health care provider. Make sure you discuss any questions you have with your health care provider. ° °

## 2015-06-07 NOTE — H&P (Signed)
_                                                                                                                History of Present Illness:  Dominique Evans is a 49 year old white female here for screening colonoscopy.  She suffers from mild chronic constipation.  She has no other lower GI complaints. Past Medical History  Diagnosis Date  . Unspecified essential hypertension   . GERD (gastroesophageal reflux disease)   . 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 Chronic constipation   . Obesity   . Asthma     PFT 08/12/07: FEV1 2.97 (104%), FEV1% 84, TLC 4.89 (90%), DLCO 84%, +BD  . Allergic rhinitis   . Panic attacks   . Hydatidiform mole   . Visual disturbance   . PONV (postoperative nausea and vomiting)   . Anemia   . Blood transfusion     hx of 2002   . Depression   . Psoriasis   . Hernia     LLQ (after kidney removed)  . Cough 03/23/13  . Peripheral neuropathy     bilateral feet  . History of stress test     exercise stress test, ? where it was done,  about 6 yrs. ago,  results- negative   . H/O hiatal hernia   . Status post dilation of esophageal narrowing   . Hypertension   . Hyperlipidemia   . Esophageal dysmotility 03/01/2015  . Diabetes mellitus     pre gastric bypass  . 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 Arthritis     spondylolisthesis- lumbar region   Past Surgical History  Procedure Laterality Date  . Nephrectomy  2002    left  . Elbow fracture surgery      left  . Shoulder arthroscopy      right shoulder  . Laparoscopic gastric banding  02/2006    w/ truncal vagotomy  . Patent ductus arterious repair    . Lap band removal     . 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 Lumbar laminectomy/decompression microdiscectomy Left  11/30/2012    Procedure: MICRO LUMBAR DECOMPRESSION L4-5 ON THE LEFT;  Surgeon: Javier Docker, MD;  Location: [facility];  Service: Orthopedics;  Laterality: Left;  MICRO LUMBAR DECOMPRESSION L4-5 ON THE LEFT  . Lumbar laminectomy/decompression microdiscectomy N/A 03/30/2013    Procedure: REDO MICRO LUMBAR DECOMPRESSION L4-5;  Surgeon: Javier Docker, MD;  Location: [facility];  Service: Orthopedics;  Laterality: N/A;  . Maximum access (mas)posterior lumbar interbody fusion (plif) 1 level N/A 05/23/2014    Procedure: FOR MAXIMUM ACCESS (MAS) POSTERIOR LUMBAR INTERBODY FUSION (PLIF) Lumbar Four-Five;  Surgeon: Tia Alert, MD;  Location: [facility];  Service: Neurosurgery;  Laterality: N/A;  FOR MAXIMUM ACCESS (MAS) POSTERIOR LUMBAR INTERBODY FUSION (PLIF) Lumbar Four-Five  . Esophageal manometry N/A 04/08/2015    Procedure: ESOPHAGEAL MANOMETRY (EM);  Surgeon: Louis Meckel, MD;  Location: [facility];  Service: Endoscopy;  Laterality: N/A;   family history includes Brain cancer in her mother; Breast cancer in her maternal aunt; Cancer in her mother; Diabetes in her mother; Heart attack in her father and sister; Heart disease in her father, mother, and sister; Hyperlipidemia in her father, mother, and sister; Hypertension in her father, mother, and sister; Lung cancer in her mother; Skin cancer in her father and mother. There is no history of Colon cancer, Colon polyps, Esophageal cancer, or Gallbladder disease. Current Facility-Administered Medications  Medication Dose Route Frequency Provider Last Rate Last Dose  . 0.9 %  sodium chloride infusion   Intravenous Continuous Louis Meckel, MD 10 mL/hr at 06/07/15 0859 500 mL at 06/07/15 0859  . botox 100 units/NS 4 mL for final conc. 25 units/mL mixture  100 Units Submucosal Once Louis Meckel, MD       Allergies as of 04/19/2015 - Review Complete 04/08/2015  Allergen Reaction Noted  . Penicillins Anaphylaxis   . Sulfonamide derivatives Anaphylaxis    . Ciprofloxacin Hives 01/05/2013    reports that she quit smoking about 32 years ago. Her smoking use included Cigarettes. She smoked 2.00 packs per day. She has never used smokeless tobacco. She reports that she does not drink alcohol or use illicit drugs.   Review of Systems: Pertinent positive and negative review of systems were noted in the above HPI section. All other review of systems were otherwise negative.  Vital signs were reviewed in today's medical record Physical Exam: General: Well developed , well nourished, no acute distress Skin: anicteric Head: Normocephalic and atraumatic Eyes:  sclerae anicteric, EOMI Ears: Normal auditory acuity Mouth: No deformity or lesions Neck: Supple, no masses or thyromegaly Lymph Nodes: no lymphadenopathy Lungs: Clear throughout to auscultation Heart: Regular rate and rhythm; no murmurs, rubs or bruits Gastroinestinal: Soft, non tender and non distended. No masses, hepatosplenomegaly or hernias noted. Normal Bowel sounds Rectal:deferred Musculoskeletal: Symmetrical with no gross deformities  Skin: No lesions on visible extremities Pulses:  Normal pulses noted Extremities: No clubbing, cyanosis, edema or deformities noted Neurological: Alert oriented x 4, grossly nonfocal Cervical Nodes:  No significant cervical adenopathy Inguinal Nodes: No significant inguinal adenopathy Psychological:  Alert and cooperative. Normal mood and affect  Impression-function of constipation  Plan-screening colonoscopy

## 2015-06-07 NOTE — Transfer of Care (Signed)
Immediate Anesthesia Transfer of Care Note  Patient: Nyilah Kight  Procedure(s) Performed: Procedure(s): ESOPHAGOGASTRODUODENOSCOPY (EGD) WITH PROPOFOL (N/A) BOTOX INJECTION (N/A) COLONOSCOPY WITH PROPOFOL (N/A)  Patient Location: PACU  Anesthesia Type:MAC  Level of Consciousness: awake, alert  and oriented  Airway & Oxygen Therapy: Patient Spontanous Breathing and Patient connected to face mask oxygen  Post-op Assessment: Report given to RN and Post -op Vital signs reviewed and stable  Post vital signs: Reviewed and stable  Last Vitals:  Filed Vitals:   06/07/15 0839  BP: 141/70  Pulse: 81  Temp: 36.7 C  Resp: 16    Complications: No apparent anesthesia complications

## 2015-06-07 NOTE — Anesthesia Postprocedure Evaluation (Signed)
  Anesthesia Post-op Note  Patient: Dominique Evans  Procedure(s) Performed: Procedure(s) (LRB): ESOPHAGOGASTRODUODENOSCOPY (EGD) WITH PROPOFOL (N/A) BOTOX INJECTION (N/A) COLONOSCOPY WITH PROPOFOL (N/A)  Patient Location: PACU  Anesthesia Type: General  Level of Consciousness: awake and alert   Airway and Oxygen Therapy: Patient Spontanous Breathing  Post-op Pain: mild  Post-op Assessment: Post-op Vital signs reviewed, Patient's Cardiovascular Status Stable, Respiratory Function Stable, Patent Airway and No signs of Nausea or vomiting  Last Vitals:  Filed Vitals:   06/07/15 1025  BP: 173/83  Pulse: 79  Temp:   Resp: 16    Post-op Vital Signs: stable   Complications: No apparent anesthesia complications

## 2015-06-07 NOTE — Anesthesia Preprocedure Evaluation (Signed)
Anesthesia Evaluation  Patient identified by MRN, date of birth, ID band Patient awake    Reviewed: Allergy & Precautions, NPO status , Patient's Chart, lab work & pertinent test results  History of Anesthesia Complications (+) PONV and history of anesthetic complications  Airway Mallampati: II  TM Distance: >3 FB Neck ROM: Full    Dental no notable dental hx.    Pulmonary asthma , sleep apnea , former smoker,  breath sounds clear to auscultation  Pulmonary exam normal       Cardiovascular Exercise Tolerance: Good hypertension, Pt. on medications + Peripheral Vascular Disease Normal cardiovascular examRhythm:Regular Rate:Normal  Heart surgery as child. Does not see a cardiologist.  METS>4   Neuro/Psych PSYCHIATRIC DISORDERS Anxiety Depression  Neuromuscular disease    GI/Hepatic Neg liver ROS, hiatal hernia, GERD-  Medicated and Controlled,  Endo/Other  diabetes, Well Controlled, Type 2, Oral Hypoglycemic Agentsobesity  Renal/GU Renal diseaseNephrolithiasis. S/p Left nephrectomy     Musculoskeletal  (+) Arthritis -, Osteoarthritis,    Abdominal   Peds  Hematology  (+) Blood dyscrasia, anemia ,   Anesthesia Other Findings Day of surgery medications reviewed with the patient.  Reproductive/Obstetrics                             Anesthesia Physical Anesthesia Plan  ASA: III  Anesthesia Plan: MAC   Post-op Pain Management:    Induction: Intravenous  Airway Management Planned: Nasal Cannula  Additional Equipment:   Intra-op Plan:   Post-operative Plan:   Informed Consent: I have reviewed the patients History and Physical, chart, labs and discussed the procedure including the risks, benefits and alternatives for the proposed anesthesia with the patient or authorized representative who has indicated his/her understanding and acceptance.   Dental advisory given  Plan Discussed with:  CRNA and Anesthesiologist  Anesthesia Plan Comments: (Discussed risks/benefits/alternatives to MAC sedation including need for ventilatory support, hypotension, need for conversion to general anesthesia.  All patient questions answered.  Patient wished to proceed.)        Anesthesia Quick Evaluation

## 2015-06-07 NOTE — Op Note (Signed)
[facility] [address] [place], [address]   COLONOSCOPY PROCEDURE REPORT     EXAM DATE: 06/07/2015  PATIENT NAME:      Dominique Evans, Dominique Evans           MR #:      [id number]  BIRTHDATE:       08/01/1966      VISIT #:     [phone]  ATTENDING:     Louis Meckel, MD     STATUS:     outpatient ASSISTANT:      Julianne Rice, and Windell Hummingbird   INDICATIONS:  The patient is a 49 yr old female here for a colonoscopy due to Colorectal Neoplasm Risk Assessment for this procedure is average risk. PROCEDURE PERFORMED:     Colonoscopy, diagnostic MEDICATIONS:     Monitored anesthesia care ESTIMATED BLOOD LOSS:     None  CONSENT: The patient understands the risks and benefits of the procedure and understands that these risks include, but are not limited to: sedation, allergic reaction, infection, perforation and/or bleeding. Alternative means of evaluation and treatment include, among others: physical exam, x-rays, and/or surgical intervention. The patient elects to proceed with this endoscopic procedure.  DESCRIPTION OF PROCEDURE: During intra-op preparation period all mechanical & medical equipment was checked for proper function. Hand hygiene and appropriate measures for infection prevention was taken. After the risks, benefits and alternatives of the procedure were thoroughly explained, Informed consent was verified, confirmed and timeout was successfully executed by the treatment team. A digital exam revealed no abnormalities of the rectum. The Pentax Adult Colonscope [id number] endoscope was introduced through the anus and advanced to the cecum, which was identified by both the appendix and ileocecal valve. suboptimal (Suprep was used) The instrument was then slowly withdrawn as the colon was fully examined.Estimated blood loss is zero unless otherwise noted in this procedure report.    COLON FINDINGS: A normal appearing cecum, ileocecal  valve, and appendiceal orifice were identified.  The ascending, transverse, descending, sigmoid colon, and rectum appeared unremarkable. Retroflexed views revealed no abnormalities. The scope was then completely withdrawn from the patient and the procedure terminated.  SCOPE WITHDRAWAL TIME:    ADVERSE EVENTS:      There were no immediate complications.  IMPRESSIONS:     Normal colonoscopy  (limited exam due to prep)  RECOMMENDATIONS:     Colonoscopy in 5 years in view of limitations on exam RECALL:  _____________________________ Louis Meckel, MD eSigned:  Louis Meckel, MD 06/07/2015 10:05 AM   cc:  Calton Golds, MD   CPT CODES: ICD CODES:  The ICD and CPT codes recommended by this software are interpretations from the data that the clinical staff has captured with the software.  The verification of the translation of this report to the ICD and CPT codes and modifiers is the sole responsibility of the health care institution and practicing physician where this report was generated.  [employer] will not be held responsible for the validity of the ICD and CPT codes included on this report.  AMA assumes no liability for data contained or not contained herein. CPT is a Publishing rights manager of the [employer].

## 2015-06-10 ENCOUNTER — Encounter (HOSPITAL_COMMUNITY): Payer: Self-pay | Admitting: Gastroenterology

## 2015-07-09 ENCOUNTER — Ambulatory Visit: Payer: Managed Care, Other (non HMO) | Admitting: Gastroenterology

## 2015-07-24 ENCOUNTER — Ambulatory Visit (INDEPENDENT_AMBULATORY_CARE_PROVIDER_SITE_OTHER): Payer: Managed Care, Other (non HMO) | Admitting: Gastroenterology

## 2015-07-24 ENCOUNTER — Encounter: Payer: Self-pay | Admitting: Gastroenterology

## 2015-07-24 VITALS — BP 128/78 | HR 80 | Ht 66.25 in | Wt 229.0 lb

## 2015-07-24 DIAGNOSIS — K224 Dyskinesia of esophagus: Secondary | ICD-10-CM | POA: Diagnosis not present

## 2015-07-24 NOTE — Assessment & Plan Note (Signed)
The patient has nonspecific esophageal motility disorder.  I'm not aware of any specific therapy to offer.  I suggested that we refer her to a motility expert but she has declined at this point.  She says she can "live with it".  I instructed her to contact the office if symptoms should worsen at which point I would reconsider obtaining a second opinion.

## 2015-07-24 NOTE — Patient Instructions (Signed)
Follow up as needed

## 2015-07-24 NOTE — Progress Notes (Signed)
      History of Present Illness:  Dominique Evans changes to complain of dysphagia to solids and liquids.  Upper endoscopy demonstrated a megaesophagus.  Esophageal manometry showed a nonspecific motility disorder.  Appendectomy GI series in 2012 also demonstrated esophageal dysmotility.    Review of Systems: Pertinent positive and negative review of systems were noted in the above HPI section. All other review of systems were otherwise negative.    Current Medications, Allergies, Past Medical History, Past Surgical History, Family History and Social History were reviewed in [employer] electronic medical record  Vital signs were reviewed in today's medical record. Physical Exam: General: Well developed , well nourished, no acute distress  See Assessment and Plan under Problem List

## 2015-08-29 ENCOUNTER — Emergency Department (HOSPITAL_COMMUNITY)
Admission: EM | Admit: 2015-08-29 | Discharge: 2015-08-30 | Disposition: A | Discharge status: Home / Self Care | Payer: Managed Care, Other (non HMO) | Attending: Emergency Medicine | Admitting: Emergency Medicine

## 2015-08-29 ENCOUNTER — Emergency Department (HOSPITAL_COMMUNITY): Payer: Managed Care, Other (non HMO)

## 2015-08-29 ENCOUNTER — Encounter (HOSPITAL_COMMUNITY): Payer: Self-pay | Admitting: Emergency Medicine

## 2015-08-29 DIAGNOSIS — F410 Panic disorder [episodic paroxysmal anxiety] without agoraphobia: Secondary | ICD-10-CM | POA: Insufficient documentation

## 2015-08-29 DIAGNOSIS — G629 Polyneuropathy, unspecified: Secondary | ICD-10-CM | POA: Diagnosis not present

## 2015-08-29 DIAGNOSIS — Z880 Allergy status to penicillin: Secondary | ICD-10-CM | POA: Insufficient documentation

## 2015-08-29 DIAGNOSIS — M1990 Unspecified osteoarthritis, unspecified site: Secondary | ICD-10-CM | POA: Diagnosis not present

## 2015-08-29 DIAGNOSIS — E119 Type 2 diabetes mellitus without complications: Secondary | ICD-10-CM | POA: Diagnosis not present

## 2015-08-29 DIAGNOSIS — Z79818 Long term (current) use of other agents affecting estrogen receptors and estrogen levels: Secondary | ICD-10-CM | POA: Diagnosis not present

## 2015-08-29 DIAGNOSIS — Z87891 Personal history of nicotine dependence: Secondary | ICD-10-CM | POA: Insufficient documentation

## 2015-08-29 DIAGNOSIS — K5900 Constipation, unspecified: Secondary | ICD-10-CM | POA: Diagnosis not present

## 2015-08-29 DIAGNOSIS — D649 Anemia, unspecified: Secondary | ICD-10-CM | POA: Insufficient documentation

## 2015-08-29 DIAGNOSIS — R079 Chest pain, unspecified: Secondary | ICD-10-CM | POA: Insufficient documentation

## 2015-08-29 DIAGNOSIS — Z9884 Bariatric surgery status: Secondary | ICD-10-CM | POA: Diagnosis not present

## 2015-08-29 DIAGNOSIS — Z9889 Other specified postprocedural states: Secondary | ICD-10-CM | POA: Insufficient documentation

## 2015-08-29 DIAGNOSIS — E785 Hyperlipidemia, unspecified: Secondary | ICD-10-CM | POA: Diagnosis not present

## 2015-08-29 DIAGNOSIS — I10 Essential (primary) hypertension: Secondary | ICD-10-CM | POA: Diagnosis not present

## 2015-08-29 DIAGNOSIS — Z7951 Long term (current) use of inhaled steroids: Secondary | ICD-10-CM | POA: Diagnosis not present

## 2015-08-29 DIAGNOSIS — Z79899 Other long term (current) drug therapy: Secondary | ICD-10-CM | POA: Diagnosis not present

## 2015-08-29 DIAGNOSIS — Z872 Personal history of diseases of the skin and subcutaneous tissue: Secondary | ICD-10-CM | POA: Diagnosis not present

## 2015-08-29 DIAGNOSIS — Q249 Congenital malformation of heart, unspecified: Secondary | ICD-10-CM | POA: Diagnosis not present

## 2015-08-29 DIAGNOSIS — F329 Major depressive disorder, single episode, unspecified: Secondary | ICD-10-CM | POA: Diagnosis not present

## 2015-08-29 DIAGNOSIS — Z87442 Personal history of urinary calculi: Secondary | ICD-10-CM | POA: Insufficient documentation

## 2015-08-29 DIAGNOSIS — J45909 Unspecified asthma, uncomplicated: Secondary | ICD-10-CM | POA: Diagnosis not present

## 2015-08-29 LAB — CBC
HCT: 37 % (ref 36.0–46.0)
Hemoglobin: 12.1 g/dL (ref 12.0–15.0)
MCH: 28.3 pg (ref 26.0–34.0)
MCHC: 32.7 g/dL (ref 30.0–36.0)
MCV: 86.4 fL (ref 78.0–100.0)
Platelets: 226 10*3/uL (ref 150–400)
RBC: 4.28 MIL/uL (ref 3.87–5.11)
RDW: 13 % (ref 11.5–15.5)
WBC: 8.4 10*3/uL (ref 4.0–10.5)

## 2015-08-29 LAB — BASIC METABOLIC PANEL WITH GFR
Anion gap: 10 (ref 5–15)
BUN: 21 mg/dL — ABNORMAL HIGH (ref 6–20)
CO2: 24 mmol/L (ref 22–32)
Calcium: 9 mg/dL (ref 8.9–10.3)
Chloride: 104 mmol/L (ref 101–111)
Creatinine, Ser: 1.13 mg/dL — ABNORMAL HIGH (ref 0.44–1.00)
GFR calc Af Amer: >60 mL/min
GFR calc non Af Amer: 55 mL/min — ABNORMAL LOW
Glucose, Bld: 159 mg/dL — ABNORMAL HIGH (ref 65–99)
Potassium: 3.9 mmol/L (ref 3.5–5.1)
Sodium: 138 mmol/L (ref 135–145)

## 2015-08-29 LAB — I-STAT TROPONIN, ED: Troponin i, poc: 0.02 ng/mL (ref 0.00–0.08)

## 2015-08-29 MED ORDER — KETOROLAC TROMETHAMINE 30 MG/ML IJ SOLN
30.0000 mg | Freq: Once | INTRAMUSCULAR | Status: AC
  Administered 2015-08-29: 30 mg via INTRAVENOUS
  Filled 2015-08-29: qty 1

## 2015-08-29 NOTE — ED Notes (Signed)
Pt from home c/o left sided chest burning since 1900 and vomiting. She reports some shortness of breath. Pt talking in full sentences.

## 2015-08-29 NOTE — ED Notes (Signed)
Pt to xray

## 2015-08-29 NOTE — Discharge Instructions (Signed)
Follow-up with cardiology to discuss a possible stress test. The contact information for the cardiology clinic has been provided in this discharge summary for you to call tomorrow and make these arrangements.  Return to the emergency department if your symptoms significantly worsen or change.   Nonspecific Chest Pain  Chest pain can be caused by many different conditions. There is always a chance that your pain could be related to something serious, such as a heart attack or a blood clot in your lungs. Chest pain can also be caused by conditions that are not life-threatening. If you have chest pain, it is very important to follow up with your health care provider. CAUSES  Chest pain can be caused by:  Heartburn.  Pneumonia or bronchitis.  Anxiety or stress.  Inflammation around your heart (pericarditis) or lung (pleuritis or pleurisy).  A blood clot in your lung.  A collapsed lung (pneumothorax). It can develop suddenly on its own (spontaneous pneumothorax) or from trauma to the chest.  Shingles infection (varicella-zoster virus).  Heart attack.  Damage to the bones, muscles, and cartilage that make up your chest wall. This can include:  Bruised bones due to injury.  Strained muscles or cartilage due to frequent or repeated coughing or overwork.  Fracture to one or more ribs.  Sore cartilage due to inflammation (costochondritis). RISK FACTORS  Risk factors for chest pain may include:  Activities that increase your risk for trauma or injury to your chest.  Respiratory infections or conditions that cause frequent coughing.  Medical conditions or overeating that can cause heartburn.  Heart disease or family history of heart disease.  Conditions or health behaviors that increase your risk of developing a blood clot.  Having had chicken pox (varicella zoster). SIGNS AND SYMPTOMS Chest pain can feel like:  Burning or tingling on the surface of your chest or deep in your  chest.  Crushing, pressure, aching, or squeezing pain.  Dull or sharp pain that is worse when you move, cough, or take a deep breath.  Pain that is also felt in your back, neck, shoulder, or arm, or pain that spreads to any of these areas. Your chest pain may come and go, or it may stay constant. DIAGNOSIS Lab tests or other studies may be needed to find the cause of your pain. Your health care provider may have you take a test called an ambulatory ECG (electrocardiogram). An ECG records your heartbeat patterns at the time the test is performed. You may also have other tests, such as:  Transthoracic echocardiogram (TTE). During echocardiography, sound waves are used to create a picture of all of the heart structures and to look at how blood flows through your heart.  Transesophageal echocardiogram (TEE).This is a more advanced imaging test that obtains images from inside your body. It allows your health care provider to see your heart in finer detail.  Cardiac monitoring. This allows your health care provider to monitor your heart rate and rhythm in real time.  Holter monitor. This is a portable device that records your heartbeat and can help to diagnose abnormal heartbeats. It allows your health care provider to track your heart activity for several days, if needed.  Stress tests. These can be done through exercise or by taking medicine that makes your heart beat more quickly.  Blood tests.  Imaging tests. TREATMENT  Your treatment depends on what is causing your chest pain. Treatment may include:  Medicines. These may include:  Acid blockers for heartburn.  Anti-inflammatory medicine.  Pain medicine for inflammatory conditions.  Antibiotic medicine, if an infection is present.  Medicines to dissolve blood clots.  Medicines to treat coronary artery disease.  Supportive care for conditions that do not require medicines. This may include:  Resting.  Applying heat or cold  packs to injured areas.  Limiting activities until pain decreases. HOME CARE INSTRUCTIONS  If you were prescribed an antibiotic medicine, finish it all even if you start to feel better.  Avoid any activities that bring on chest pain.  Do not use any tobacco products, including cigarettes, chewing tobacco, or electronic cigarettes. If you need help quitting, ask your health care provider.  Do not drink alcohol.  Take medicines only as directed by your health care provider.  Keep all follow-up visits as directed by your health care provider. This is important. This includes any further testing if your chest pain does not go away.  If heartburn is the cause for your chest pain, you may be told to keep your head raised (elevated) while sleeping. This reduces the chance that acid will go from your stomach into your esophagus.  Make lifestyle changes as directed by your health care provider. These may include:  Getting regular exercise. Ask your health care provider to suggest some activities that are safe for you.  Eating a heart-healthy diet. A registered dietitian can help you to learn healthy eating options.  Maintaining a healthy weight.  Managing diabetes, if necessary.  Reducing stress. SEEK MEDICAL CARE IF:  Your chest pain does not go away after treatment.  You have a rash with blisters on your chest.  You have a fever. SEEK IMMEDIATE MEDICAL CARE IF:   Your chest pain is worse.  You have an increasing cough, or you cough up blood.  You have severe abdominal pain.  You have severe weakness.  You faint.  You have chills.  You have sudden, unexplained chest discomfort.  You have sudden, unexplained discomfort in your arms, back, neck, or jaw.  You have shortness of breath at any time.  You suddenly start to sweat, or your skin gets clammy.  You feel nauseous or you vomit.  You suddenly feel light-headed or dizzy.  Your heart begins to beat quickly, or  it feels like it is skipping beats. These symptoms may represent a serious problem that is an emergency. Do not wait to see if the symptoms will go away. Get medical help right away. Call your local emergency services (911 in the [place] Do not drive yourself to the hospital.   This information is not intended to replace advice given to you by your health care provider. Make sure you discuss any questions you have with your health care provider.   Document Released: 07/15/2005 Document Revised: 10/26/2014 Document Reviewed: 05/11/2014 Elsevier Interactive Patient Education [employer].

## 2015-08-29 NOTE — ED Provider Notes (Signed)
CSN: [id number]     Arrival date & time 08/29/15  2104 History   First MD Initiated Contact with Patient 08/29/15 2148     Chief Complaint  Patient presents with  . Chest Pain     (Consider location/radiation/quality/duration/timing/severity/associated sxs/prior Treatment) HPI Comments: Patient is a 49 year old female with history of asthma, GERD, hypertension. She presents for evaluation of burning in the left side of her chest that started approximately 7:00 this evening when coming home from walking the dog. She denies any shortness of breath, diaphoresis, nausea, or radiation to the arm or jaw. She denies any fevers or chills. She denies any productive cough.  She has no prior cardiac history, however does have the risk factors of a family history, hypertension, and obesity.  Patient is a 49 y.o. female presenting with chest pain. The history is provided by the patient.  Chest Pain Pain location:  L chest Pain quality: burning   Pain radiates to:  Does not radiate Pain radiates to the back: no   Pain severity:  Moderate Onset quality:  Sudden Timing:  Constant Progression:  Partially resolved Chronicity:  New Context: not breathing and no movement   Relieved by:  Nothing Worsened by:  Nothing tried Ineffective treatments:  None tried   Past Medical History  Diagnosis Date  . Unspecified essential hypertension   . GERD (gastroesophageal reflux disease)   . 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 Chronic constipation   . Obesity   . Asthma     PFT 08/12/07: FEV1 2.97 (104%), FEV1% 84, TLC 4.89 (90%), DLCO 84%, +BD  . Allergic rhinitis   . Panic attacks   . Hydatidiform mole   . Visual disturbance   . PONV (postoperative nausea and vomiting)   . Anemia   . Blood transfusion     hx of 2002   . Depression   . Psoriasis   . Hernia     LLQ (after kidney removed)  . Cough 03/23/13  . Peripheral neuropathy (HCC)     bilateral  feet  . History of stress test     exercise stress test, ? where it was done,  about 6 yrs. ago,  results- negative   . H/O hiatal hernia   . Status post dilation of esophageal narrowing   . Hypertension   . Hyperlipidemia   . Esophageal dysmotility 03/01/2015  . Diabetes mellitus     pre gastric bypass  . 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 Arthritis     spondylolisthesis- lumbar region   Past Surgical History  Procedure Laterality Date  . Nephrectomy  2002    left  . Elbow fracture surgery      left  . Shoulder arthroscopy      right shoulder  . Laparoscopic gastric banding  02/2006    w/ truncal vagotomy  . Patent ductus arterious repair    . Lap band removal     . 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 Lumbar laminectomy/decompression microdiscectomy Left 11/30/2012    Procedure: MICRO LUMBAR DECOMPRESSION L4-5 ON THE LEFT;  Surgeon: Javier DockerJeffrey C Beane, MD;  Location: [facility];  Service: Orthopedics;  Laterality: Left;  MICRO LUMBAR DECOMPRESSION L4-5 ON THE LEFT  . Lumbar  laminectomy/decompression microdiscectomy N/A 03/30/2013    Procedure: REDO MICRO LUMBAR DECOMPRESSION L4-5;  Surgeon: Javier Docker, MD;  Location: [facility];  Service: Orthopedics;  Laterality: N/A;  . Maximum access (mas)posterior lumbar interbody fusion (plif) 1 level N/A 05/23/2014    Procedure: FOR MAXIMUM ACCESS (MAS) POSTERIOR LUMBAR INTERBODY FUSION (PLIF) Lumbar Four-Five;  Surgeon: Tia Alert, MD;  Location: [facility];  Service: Neurosurgery;  Laterality: N/A;  FOR MAXIMUM ACCESS (MAS) POSTERIOR LUMBAR INTERBODY FUSION (PLIF) Lumbar Four-Five  . Esophageal manometry N/A 04/08/2015    Procedure: ESOPHAGEAL MANOMETRY (EM);  Surgeon: Louis Meckel, MD;  [facility]: [facility];  Service: Endoscopy;  Laterality: N/A;  .  Esophagogastroduodenoscopy (egd) with propofol N/A 06/07/2015    Procedure: ESOPHAGOGASTRODUODENOSCOPY (EGD) WITH PROPOFOL;  Surgeon: Louis Meckel, MD;  [facility];  Service: Endoscopy;  Laterality: N/A;  . Botox injection N/A 06/07/2015    Procedure: BOTOX INJECTION;  Surgeon: Louis Meckel, MD;  [facility];  Service: Endoscopy;  Laterality: N/A;  . Colonoscopy with propofol N/A 06/07/2015    Procedure: COLONOSCOPY WITH PROPOFOL;  Surgeon: Louis Meckel, MD;  [facility];  Service: Endoscopy;  Laterality: N/A;   Family History  Problem Relation Age of Onset  . Hypertension Father   . Skin cancer Father   . Heart disease Father     before age 81  . Heart attack Father   . Hyperlipidemia Father   . Diabetes Mother   . Hypertension Mother   . Skin cancer Mother   . Breast cancer Mother     Melanoma  . Heart disease Mother   . Hyperlipidemia Mother   . Hypertension Sister   . Heart disease Sister     before age 43  . Heart attack Sister   . Hyperlipidemia Sister   . Breast cancer Maternal Aunt   . Lung cancer Mother   . Brain cancer Mother   . Colon cancer Neg Hx   . Colon polyps Neg Hx   . Esophageal cancer Neg Hx   . Gallbladder disease Neg Hx    Social History  Substance Use Topics  . Smoking status: Former Smoker -- 2.00 packs/day    Types: Cigarettes    Quit date: 10/19/1982  . Smokeless tobacco: Never Used  . Alcohol Use: No   OB History    No data available     Review of Systems  Cardiovascular: Positive for chest pain.  All other systems reviewed and are negative.     Allergies  Penicillins; Sulfonamide derivatives; Clindamycin/lincomycin; and Ciprofloxacin  Home Medications   Prior to Admission medications   Medication Sig Start Date End Date Taking? Authorizing Provider  adalimumab (HUMIRA PEN) 40 MG/0.8ML injection Inject 40 mg into the skin every 14 (fourteen) days.    Yes Historical Provider, MD  albuterol  (ACCUNEB) 1.25 MG/3ML nebulizer solution Take 3 mLs by nebulization every 4 (four) hours as needed for wheezing or shortness of breath.  11/20/14  Yes Historical Provider, MD  albuterol (PROVENTIL HFA;VENTOLIN HFA) 108 (90 BASE) MCG/ACT inhaler Inhale 1-2 puffs into the lungs every 4 (four) hours as needed for wheezing or shortness of breath.   Yes Historical Provider, MD  amitriptyline (ELAVIL) 25 MG tablet Take 50 mg by mouth at bedtime.    Yes Historical Provider, MD  Apoaequorin (PREVAGEN) 10 MG CAPS Take 10 mg by mouth daily.   Yes Historical Provider, MD  CALCIUM-VITAMIN D PO Take 1 tablet by  mouth daily.    Yes Historical Provider, MD  Cyanocobalamin (B-12) 2500 MCG TABS Take 1 tablet by mouth daily.   Yes Historical Provider, MD  dexlansoprazole (DEXILANT) 60 MG capsule Take 60 mg by mouth daily.    Yes Historical Provider, MD  ferrous sulfate 325 (65 FE) MG tablet Take 325 mg by mouth daily with breakfast.   Yes Historical Provider, MD  Fluticasone-Salmeterol (ADVAIR) 250-50 MCG/DOSE AEPB Inhale 1 puff into the lungs 2 (two) times daily.   Yes Historical Provider, MD  furosemide (LASIX) 20 MG tablet Take 20 mg by mouth every morning.    Yes Historical Provider, MD  gabapentin (NEURONTIN) 300 MG capsule Take 300-600 mg by mouth 5 (five) times daily. 1 capsule QID and 2 caps  at bedtime   Yes Historical Provider, MD  losartan (COZAAR) 50 MG tablet Take 50 mg by mouth every morning.  09/21/11  Yes Historical Provider, MD  magnesium gluconate (MAGONATE) 500 MG tablet Take 500 mg by mouth daily.   Yes Historical Provider, MD  methocarbamol (ROBAXIN) 500 MG tablet Take 1 tablet (500 mg total) by mouth every 6 (six) hours as needed for muscle spasms. Patient taking differently: Take 500 mg by mouth 3 (three) times daily.  05/25/14  Yes Tia Alert, MD  montelukast (SINGULAIR) 10 MG tablet Take 10 mg by mouth daily with breakfast.    Yes Historical Provider, MD  Multiple Vitamins-Minerals (MULTIVITAMIN  PO) Take 1 tablet by mouth daily.    Yes Historical Provider, MD  norethindrone-ethinyl estradiol (JUNEL FE,GILDESS FE,LOESTRIN FE) 1-20 MG-MCG tablet Take 1 tablet by mouth at bedtime.    Yes Historical Provider, MD  oxyCODONE-acetaminophen (PERCOCET/ROXICET) 5-325 MG tablet Take 1 tablet by mouth 5 (five) times daily.   Yes Historical Provider, MD  polyethylene glycol (MIRALAX) powder Take 17 g by mouth daily.    Yes Historical Provider, MD  potassium citrate (UROCIT-K) 10 MEQ (1080 MG) SR tablet Take 10 mEq by mouth 2 (two) times daily.    Yes Historical Provider, MD  PRESCRIPTION MEDICATION once. Nerve injection at pain management   Yes Historical Provider, MD  PRISTIQ 50 MG 24 hr tablet Take 50 mg by mouth daily. 04/20/15  Yes Historical Provider, MD  rosuvastatin (CRESTOR) 10 MG tablet Take 5 mg by mouth every morning.    Yes Historical Provider, MD  verapamil (COVERA HS) 240 MG (CO) 24 hr tablet Take 240 mg by mouth every morning.    Yes Historical Provider, MD   BP 150/68 mmHg  Pulse 89  Temp(Src) 98.1 F (36.7 C) (Oral)  Resp 22  SpO2 100%  LMP 08/11/2015 Physical Exam  Constitutional: She is oriented to person, place, and time. She appears well-developed and well-nourished. No distress.  HENT:  Head: Normocephalic and atraumatic.  Neck: Normal range of motion. Neck supple.  Cardiovascular: Normal rate and regular rhythm.  Exam reveals no gallop and no friction rub.   No murmur heard. Pulmonary/Chest: Effort normal and breath sounds normal. No respiratory distress. She has no wheezes. She has no rales.  Abdominal: Soft. Bowel sounds are normal. She exhibits no distension. There is no tenderness.  Musculoskeletal: Normal range of motion.  Neurological: She is alert and oriented to person, place, and time.  Skin: Skin is warm and dry. She is not diaphoretic.  Nursing note and vitals reviewed.   ED Course  Procedures (including critical care time) Labs Review Labs Reviewed   CBC  BASIC METABOLIC PANEL  I-STAT TROPOININ,  ED    Imaging Review Dg Chest 2 View  08/29/2015  CLINICAL DATA:  Left chest burning since 7 p.m. today. EXAM: CHEST  2 VIEW COMPARISON:  April 29, 2015 FINDINGS: The heart size and mediastinal contours are stable. Right aortic arch is noted. There is no focal infiltrate pulmonary edema, or pleural effusion. The visualized skeletal structures are stable. IMPRESSION: No active cardiopulmonary disease. Electronically Signed   By: Sherian Rein M.D.   On: 08/29/2015 21:50   I have personally reviewed and evaluated these images and lab results as part of my medical decision-making.   EKG Interpretation   Date/Time:  Thursday August 29 2015 21:18:56 EST Ventricular Rate:  83 PR Interval:  131 QRS Duration: 97 QT Interval:  407 QTC Calculation: 478 R Axis:   17 Text Interpretation:  Sinus rhythm Baseline wander in lead(s) V2 No  significant change since 05/11/14 Confirmed by Layal Javid  MD, Natilee Gauer ([id number]) on  08/29/2015 10:04:59 PM      MDM   Final diagnoses:  None    Patient is a 49 year old female with several risk factors, however no cardiac history. She presents for evaluation of chest discomfort that started when she returned home from walking the dog. She described this as a burning in her left upper chest. Her symptoms are somewhat atypical for cardiac pain and EKG is normal and troponin is negative. Due to her significant family history, I feel as though a second troponin is warranted. If this is negative and she remains symptom-free, I feel as though she would be appropriate for discharge with outpatient cardiology follow-up. She may well require a stress test.  Care will be signed out to the oncoming provider at shift change awaiting a second troponin. This is negative I feel as though she is appropriate to be discharged.    Geoffery Lyons, MD 08/29/15 [phone]

## 2015-08-30 LAB — I-STAT TROPONIN, ED: TROPONIN I, POC: 0.04 ng/mL (ref 0.00–0.08)

## 2015-09-10 ENCOUNTER — Encounter: Payer: Self-pay | Admitting: Physician Assistant

## 2015-09-10 ENCOUNTER — Telehealth: Payer: Self-pay | Admitting: Physician Assistant

## 2015-09-10 ENCOUNTER — Ambulatory Visit (INDEPENDENT_AMBULATORY_CARE_PROVIDER_SITE_OTHER): Payer: Managed Care, Other (non HMO) | Admitting: Physician Assistant

## 2015-09-10 VITALS — BP 124/72 | HR 76 | Ht 66.5 in | Wt 226.0 lb

## 2015-09-10 DIAGNOSIS — R079 Chest pain, unspecified: Secondary | ICD-10-CM | POA: Diagnosis not present

## 2015-09-10 MED ORDER — GI COCKTAIL ~~LOC~~: 30.0000 mL | Freq: Two times a day (BID) | ORAL | Status: DC | PRN

## 2015-09-10 MED ORDER — NITROGLYCERIN 0.4 MG SL SUBL: 0.4000 mg | SUBLINGUAL_TABLET | SUBLINGUAL | Status: DC | PRN

## 2015-09-10 NOTE — Telephone Encounter (Signed)
Spoke with pharmacy staff and GI cocktail is too costly for patient.   Per Bjorn Loserhonda, [place] - patient can try Maalox or Mylanta for symptoms to rule out if symptoms are GI related or cardiac in nature - she was also given Rx for nitro to use prn.  Patient and pharmacy notified of this. Patient voiced understanding.

## 2015-09-10 NOTE — Telephone Encounter (Signed)
Pt needs to use something else,her insurance would not cover the GI Cocktail.

## 2015-09-10 NOTE — Patient Instructions (Addendum)
Your physician has requested that you have an exercise stress myoview. For further information please visit [email or web address] Please follow instruction sheet, as given.  Your physician recommends that you schedule a follow-up appointment after your stress test with either Bjorn Loserhonda, [place] or Dr. Christiana PellantKelly   Rhonda, PA has prescribed a GI cocktail - take as directed as needed for indigestion.  [facility] Please obtain future refills from primary

## 2015-09-10 NOTE — Progress Notes (Signed)
Cardiology Office Note   Date:  09/10/2015   ID:  Dominique Evans, DOB 09-10-1966, MRN [id number]  PCP:  [name], MD  Cardiologist: Georgena Spurling, PA-C   Chief Complaint  Patient presents with  . Follow-up    has chest pain, has shortness of breath, has edema in ankle, has pain in legs, no cramping in legs, occassional lightheadedness, occassional dizziness    History of Present Illness: Dominique Evans is a 49 y.o. 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   Dominique Evans presents for evaluation of chest pain.   She had chest pain the other day, there was some minor exertion involved. The first she thought was shortness of breath. Then she developed this burning pain in her chest. She stated she was trying to vomit but only some clear fluid came out. She went to the emergency room where ECG and enzymes were negative. Her symptoms resolved without intervention. She was discharged home.  Since then, she has had 2 additional episodes of chest pain. The pain is the same each time. Shortness of breath, then a burning on the left side of her chest. It can become severe. She will feel nauseated and try to vomit, just getting up white phlegm. There is no diaphoresis. She has not tried any medications for it. It will resolve spontaneously.  She had PDA surgery as a small child. She has had multiple GI issues and procedures. Her reflux symptoms are burning. She also has a hiatal hernia and a motility disorder which causes spontaneous vomiting at times. This is not frequent. Because of some previous surgeries, she has a large hernia on her left side.  Past Medical History  Diagnosis Date  . Unspecified essential hypertension   . GERD (gastroesophageal reflux disease)   . 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 Chronic constipation     . Obesity   . Asthma     PFT 08/12/07: FEV1 2.97 (104%), FEV1% 84, TLC 4.89 (90%), DLCO 84%, +BD  . Allergic rhinitis   . Panic attacks   . Hydatidiform mole   . Visual disturbance   . PONV (postoperative nausea and vomiting)   . Anemia   . Blood transfusion     hx of 2002   . Depression   . Psoriasis   . Hernia     LLQ (after kidney removed)  . Cough 03/23/13  . Peripheral neuropathy (HCC)     bilateral feet  . History of stress test     exercise stress test, ? where it was done,  about 6 yrs. ago,  results- negative   . H/O hiatal hernia   . Status post dilation of esophageal narrowing   . Hypertension   . Hyperlipidemia   . Esophageal dysmotility 03/01/2015  . Diabetes mellitus     pre gastric bypass  . 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 Arthritis     spondylolisthesis- lumbar region    Past Surgical History  Procedure Laterality Date  . Nephrectomy  2002    left  . Elbow fracture surgery      left  . Shoulder arthroscopy      right shoulder  . Laparoscopic gastric banding  02/2006    w/ truncal vagotomy  .  Patent ductus arterious repair    . Lap band removal     . 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 Lumbar laminectomy/decompression microdiscectomy Left 11/30/2012    Procedure: MICRO LUMBAR DECOMPRESSION L4-5 ON THE LEFT;  Surgeon: Javier DockerJeffrey C Beane, MD;  Location: [facility];  Service: Orthopedics;  Laterality: Left;  MICRO LUMBAR DECOMPRESSION L4-5 ON THE LEFT  . Lumbar laminectomy/decompression microdiscectomy N/A 03/30/2013    Procedure: REDO MICRO LUMBAR DECOMPRESSION L4-5;  Surgeon: Javier DockerJeffrey C Beane, MD;  Location: [facility];  Service: Orthopedics;  Laterality: N/A;  . Maximum access (mas)posterior lumbar interbody fusion (plif) 1 level N/A 05/23/2014    Procedure: FOR MAXIMUM ACCESS (MAS)  POSTERIOR LUMBAR INTERBODY FUSION (PLIF) Lumbar Four-Five;  Surgeon: Tia Alertavid S Jones, MD;  Location: [facility];  Service: Neurosurgery;  Laterality: N/A;  FOR MAXIMUM ACCESS (MAS) POSTERIOR LUMBAR INTERBODY FUSION (PLIF) Lumbar Four-Five  . Esophageal manometry N/A 04/08/2015    Procedure: ESOPHAGEAL MANOMETRY (EM);  Surgeon: Louis Meckelobert D Kaplan, MD;  [facility];  Service: Endoscopy;  Laterality: N/A;  . Esophagogastroduodenoscopy (egd) with propofol N/A 06/07/2015    Procedure: ESOPHAGOGASTRODUODENOSCOPY (EGD) WITH PROPOFOL;  Surgeon: Louis Meckelobert D Kaplan, MD;  [facility];  Service: Endoscopy;  Laterality: N/A;  . Botox injection N/A 06/07/2015    Procedure: BOTOX INJECTION;  Surgeon: Louis Meckelobert D Kaplan, MD;  [facility];  Service: Endoscopy;  Laterality: N/A;  . Colonoscopy with propofol N/A 06/07/2015    Procedure: COLONOSCOPY WITH PROPOFOL;  Surgeon: Louis Meckelobert D Kaplan, MD;  [facility]: [facility];  Service: Endoscopy;  Laterality: N/A;    Current Outpatient Prescriptions  Medication Sig Dispense Refill  . adalimumab (HUMIRA PEN) 40 MG/0.8ML injection Inject 40 mg into the skin every 14 (fourteen) days.     Marland Kitchen. albuterol (ACCUNEB) 1.25 MG/3ML nebulizer solution Take 3 mLs by nebulization every 4 (four) hours as needed for wheezing or shortness of breath.   3  . albuterol (PROVENTIL HFA;VENTOLIN HFA) 108 (90 BASE) MCG/ACT inhaler Inhale 1-2 puffs into the lungs every 4 (four) hours as needed for wheezing or shortness of breath.    Marland Kitchen. amitriptyline (ELAVIL) 25 MG tablet Take 50 mg by mouth at bedtime.     Marland Kitchen. Apoaequorin (PREVAGEN) 10 MG CAPS Take 10 mg by mouth daily.    Marland Kitchen. CALCIUM-VITAMIN D PO Take 1 tablet by mouth daily.     . Cyanocobalamin (B-12) 2500 MCG TABS Take 1 tablet by mouth daily.    Marland Kitchen. dexlansoprazole (DEXILANT) 60 MG capsule Take 60 mg by mouth daily.     . ferrous sulfate 325 (65 FE) MG tablet Take 325 mg by mouth daily with breakfast.    . Fluticasone-Salmeterol  (ADVAIR) 250-50 MCG/DOSE AEPB Inhale 1 puff into the lungs 2 (two) times daily.    . furosemide (LASIX) 20 MG tablet Take 20 mg by mouth every morning.     . gabapentin (NEURONTIN) 300 MG capsule Take 300-600 mg by mouth 5 (five) times daily. 1 capsule QID and 2 caps  at bedtime    . losartan (COZAAR) 50 MG tablet Take 50 mg by mouth every morning.     . magnesium gluconate (MAGONATE) 500 MG tablet Take 500 mg by mouth daily.    . methocarbamol (ROBAXIN) 500 MG tablet Take 1 tablet (500 mg total) by mouth every 6 (six) hours  as needed for muscle spasms. (Patient taking differently: Take 500 mg by mouth 3 (three) times daily. ) 60 tablet 1  . montelukast (SINGULAIR) 10 MG tablet Take 10 mg by mouth daily with breakfast.     . Multiple Vitamins-Minerals (MULTIVITAMIN PO) Take 1 tablet by mouth daily.     . norethindrone-ethinyl estradiol (JUNEL FE,GILDESS FE,LOESTRIN FE) 1-20 MG-MCG tablet Take 1 tablet by mouth at bedtime.     Marland Kitchen oxyCODONE-acetaminophen (PERCOCET/ROXICET) 5-325 MG tablet Take 1 tablet by mouth 5 (five) times daily.    . polyethylene glycol (MIRALAX) powder Take 17 g by mouth daily.     . potassium citrate (UROCIT-K) 10 MEQ (1080 MG) SR tablet Take 10 mEq by mouth 2 (two) times daily.     Marland Kitchen PRESCRIPTION MEDICATION once. Nerve injection at pain management    . PRISTIQ 50 MG 24 hr tablet Take 50 mg by mouth daily.  3  . rosuvastatin (CRESTOR) 10 MG tablet Take 5 mg by mouth every morning.     . verapamil (COVERA HS) 240 MG (CO) 24 hr tablet Take 240 mg by mouth every morning.      No current facility-administered medications for this visit.    Allergies:   Penicillins; Sulfonamide derivatives; Clindamycin/lincomycin; and Ciprofloxacin    Social History:  The patient  reports that she quit smoking about 32 years ago. Her smoking use included Cigarettes. She smoked 2.00 packs per day. She has never used smokeless tobacco. She reports that she does not drink alcohol or use illicit  drugs.   Family History:  The patient's family history includes Brain cancer in her mother; Breast cancer in her maternal aunt and mother; Diabetes in her mother; Heart attack in her father and sister; Heart disease in her father, mother, and sister; Hyperlipidemia in her father, mother, and sister; Hypertension in her father, mother, and sister; Lung cancer in her mother; Skin cancer in her father and mother. There is no history of Colon cancer, Colon polyps, Esophageal cancer, or Gallbladder disease.    ROS:  Please see the history of present illness. All other systems are reviewed and negative.    PHYSICAL EXAM: VS:  BP 124/72 mmHg  Pulse 76  Ht 5' 6.5" (1.689 m)  Wt 226 lb (102.513 kg)  BMI 35.94 kg/m2  LMP 08/11/2015 , BMI Body mass index is 35.94 kg/(m^2). GEN: Well nourished, well developed, female in no acute distress HEENT: normal for age  Neck: no JVD, no carotid bruit, no masses Cardiac: RRR; no murmur, no rubs, or gallops Respiratory:  clear to auscultation bilaterally, normal work of breathing GI: soft, nontender, nondistended, + BS; large ventral hernia noted, no tenderness MS: no deformity or atrophy; no edema; distal pulses are 2+ in all 4 extremities  Skin: warm and dry, no rash Neuro:  Strength and sensation are intact Psych: euthymic mood, full affect   EKG:  EKG is ordered today. The ekg ordered today demonstrates sinus rhythm, no acute changes   Recent Labs: 08/29/2015: BUN 21*; Creatinine, Ser 1.13*; Hemoglobin 12.1; Platelets 226; Potassium 3.9; Sodium 138    Lipid Panel    Component Value Date/Time   CHOL 119 01/06/2013 0936   TRIG 113 01/06/2013 0936   HDL 36* 01/06/2013 0936   CHOLHDL 3.3 01/06/2013 0936   VLDL 23 01/06/2013 0936   LDLCALC 60 01/06/2013 0936     Wt Readings from Last 3 Encounters:  09/10/15 226 lb (102.513 kg)  07/24/15 229 lb (103.874 kg)  06/07/15 225 lb (102.059 kg)     Other studies Reviewed: Additional studies/  records that were reviewed today include: Hospital records, previous ECGs and office notes.  ASSESSMENT AND PLAN: Dr. Tresa Endo was in to see Ms Kyllonen  1.  Chest pain: Her symptoms are concerning for angina but they are also possibly GI related. Initially, a GI cocktail was ordered but she was not able to afford it, so she will not be able get that filled. She is to use Maalox for the pain and if it does not resolve with the Maalox, she was given a prescription for sublingual nitroglycerin and instructed in its use. An exercise Myoview will be scheduled and she will follow-up after that.   Because she is not having resting symptoms and because she has significant GI issues, we will hold off on aspirin for now. Her blood pressure and heart rate are under control. If her stress test is positive for CAD, we will add aspirin, a beta blocker, and a statin   Current medicines are reviewed at length with the patient today.  The patient does not have concerns regarding medicines.  The following changes have been made:  Add nitroglycerin   Labs/ tests ordered today include:   Orders Placed This Encounter  Procedures  . Myocardial Perfusion Imaging  . EKG 12-Lead     Disposition:   FU with Dr. Tresa Endo or myself after the stress test  Signed, Leanna Battles  09/10/2015 4:51 PM    [facility] [address] [place], [place], [place]  [address] Phone: [phone]; Fax: [phone]

## 2015-09-25 ENCOUNTER — Telehealth (HOSPITAL_COMMUNITY): Payer: Self-pay

## 2015-09-25 NOTE — Telephone Encounter (Signed)
Encounter complete. 

## 2015-09-26 ENCOUNTER — Ambulatory Visit (HOSPITAL_COMMUNITY)
Admission: RE | Admit: 2015-09-26 | Discharge: 2015-09-26 | Disposition: A | Discharge status: Home / Self Care | Payer: Managed Care, Other (non HMO) | Source: Ambulatory Visit | Attending: Cardiovascular Disease | Admitting: Cardiovascular Disease

## 2015-09-26 DIAGNOSIS — Z87891 Personal history of nicotine dependence: Secondary | ICD-10-CM | POA: Insufficient documentation

## 2015-09-26 DIAGNOSIS — R0609 Other forms of dyspnea: Secondary | ICD-10-CM | POA: Insufficient documentation

## 2015-09-26 DIAGNOSIS — G4733 Obstructive sleep apnea (adult) (pediatric): Secondary | ICD-10-CM | POA: Diagnosis not present

## 2015-09-26 DIAGNOSIS — R0602 Shortness of breath: Secondary | ICD-10-CM | POA: Diagnosis not present

## 2015-09-26 DIAGNOSIS — R42 Dizziness and giddiness: Secondary | ICD-10-CM | POA: Insufficient documentation

## 2015-09-26 DIAGNOSIS — I739 Peripheral vascular disease, unspecified: Secondary | ICD-10-CM | POA: Insufficient documentation

## 2015-09-26 DIAGNOSIS — E669 Obesity, unspecified: Secondary | ICD-10-CM | POA: Insufficient documentation

## 2015-09-26 DIAGNOSIS — Z6836 Body mass index (BMI) 36.0-36.9, adult: Secondary | ICD-10-CM | POA: Insufficient documentation

## 2015-09-26 DIAGNOSIS — I10 Essential (primary) hypertension: Secondary | ICD-10-CM | POA: Insufficient documentation

## 2015-09-26 DIAGNOSIS — R079 Chest pain, unspecified: Secondary | ICD-10-CM

## 2015-09-26 DIAGNOSIS — Z8249 Family history of ischemic heart disease and other diseases of the circulatory system: Secondary | ICD-10-CM | POA: Insufficient documentation

## 2015-09-26 MED ORDER — TECHNETIUM TC 99M SESTAMIBI GENERIC - CARDIOLITE
31.4000 | Freq: Once | INTRAVENOUS | Status: AC | PRN
  Administered 2015-09-26: 31 via INTRAVENOUS

## 2015-09-27 ENCOUNTER — Ambulatory Visit (HOSPITAL_COMMUNITY)
Admission: RE | Admit: 2015-09-27 | Discharge: 2015-09-27 | Disposition: A | Discharge status: Home / Self Care | Payer: Managed Care, Other (non HMO) | Source: Ambulatory Visit | Attending: Cardiology | Admitting: Cardiology

## 2015-09-27 LAB — MYOCARDIAL PERFUSION IMAGING
CHL CUP NUCLEAR SDS: 0
CHL CUP NUCLEAR SSS: 1
CHL CUP RESTING HR STRESS: 82 {beats}/min
CHL RATE OF PERCEIVED EXERTION: 17
CSEPED: 6 min
Estimated workload: 7 METS
Exercise duration (sec): 0 s
LV dias vol: 124 mL
LV sys vol: 48 mL
MPHR: 169 {beats}/min
Peak HR: 155 {beats}/min
Percent HR: 91 %
SRS: 1
TID: 1.25

## 2015-09-27 MED ORDER — TECHNETIUM TC 99M SESTAMIBI GENERIC - CARDIOLITE
32.6000 | Freq: Once | INTRAVENOUS | Status: AC | PRN
  Administered 2015-09-27: 33 via INTRAVENOUS

## 2015-10-07 ENCOUNTER — Encounter: Payer: Self-pay | Admitting: Physician Assistant

## 2015-10-07 ENCOUNTER — Ambulatory Visit (INDEPENDENT_AMBULATORY_CARE_PROVIDER_SITE_OTHER): Payer: Managed Care, Other (non HMO) | Admitting: Physician Assistant

## 2015-10-07 VITALS — BP 190/120 | HR 89 | Ht 66.5 in | Wt 225.0 lb

## 2015-10-07 DIAGNOSIS — R079 Chest pain, unspecified: Secondary | ICD-10-CM

## 2015-10-07 MED ORDER — DEXLANSOPRAZOLE 60 MG PO CPDR: 60.0000 mg | DELAYED_RELEASE_CAPSULE | Freq: Two times a day (BID) | ORAL | Status: DC

## 2015-10-07 MED ORDER — SUCRALFATE 1 G PO TABS: 1.0000 g | ORAL_TABLET | Freq: Three times a day (TID) | ORAL | Status: DC

## 2015-10-07 NOTE — Progress Notes (Signed)
Cardiology Office Note   Date:  10/07/2015   ID:  Dominique MaidensSusan Hartshorne, DOB 07/14/1966, MRN [id number]  PCP:  Gaspar Garbeichard W [name], MD  Cardiologist:  Dr Venora MaplesKelly  Gurkaran Rahm, PA-C   No chief complaint on file.   History of Present Illness: Dominique Evans is a 49 y.o. 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   Seen 11/22 for chest pain, MV scheduled.   Dominique MaidensSusan Upadhyay presents for follow-up of her Myoview.   Myoview results are below. She is relieved to know that it was normal, but also depressed because she is having so much pain. The pain is continuous. It is across her chest. She gets some relief with antacids but was not able to afford the GI cocktail. At one point in the past, a GI M.D. Told her there was nothing more they could do. She took this to mean that there were no additional treatments or medications as well as no more procedures.   The pain is a burning and a pressure. It reaches a 7 or 8 out of 10.  The Mylanta helps a little bit. She has tried nitroglycerin but that doesn't really help.   Past Medical History  Diagnosis Date  . Unspecified essential hypertension   . GERD (gastroesophageal reflux disease)   . 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 Chronic constipation   . Obesity   . Asthma     PFT 08/12/07: FEV1 2.97 (104%), FEV1% 84, TLC 4.89 (90%), DLCO 84%, +BD  . Allergic rhinitis   . Panic attacks   . Hydatidiform mole   . Visual disturbance   . PONV (postoperative nausea and vomiting)   . Anemia   . Blood transfusion     hx of 2002   . Depression   . Psoriasis   . Hernia     LLQ (after kidney removed)  . Cough 03/23/13  . Peripheral neuropathy (HCC)     bilateral feet  . History of stress test     exercise stress test, ? where it was done,  about 6 yrs. ago,  results- negative   . H/O hiatal hernia   .  Status post dilation of esophageal narrowing   . Hypertension   . Hyperlipidemia   . Esophageal dysmotility 03/01/2015  . Diabetes mellitus     pre gastric bypass  . 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 Arthritis     spondylolisthesis- lumbar region    Past Surgical History  Procedure Laterality Date  . Nephrectomy  2002    left  . Elbow fracture surgery      left  . Shoulder arthroscopy      right shoulder  . Laparoscopic gastric banding  02/2006    w/ truncal vagotomy  . Patent ductus arterious repair    . Lap band removal     . 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 Lumbar laminectomy/decompression microdiscectomy Left 11/30/2012    Procedure: MICRO LUMBAR DECOMPRESSION L4-5 ON THE LEFT;  Surgeon: Javier DockerJeffrey C Beane, MD;  Location: [facility];  Service: Orthopedics;  Laterality: Left;  MICRO LUMBAR DECOMPRESSION L4-5 ON  THE LEFT  . Lumbar laminectomy/decompression microdiscectomy N/A 03/30/2013    Procedure: REDO MICRO LUMBAR DECOMPRESSION L4-5;  Surgeon: Javier Docker, MD;  Location: [facility];  Service: Orthopedics;  Laterality: N/A;  . Maximum access (mas)posterior lumbar interbody fusion (plif) 1 level N/A 05/23/2014    Procedure: FOR MAXIMUM ACCESS (MAS) POSTERIOR LUMBAR INTERBODY FUSION (PLIF) Lumbar Four-Five;  Surgeon: Tia Alert, MD;  Location: [facility];  Service: Neurosurgery;  Laterality: N/A;  FOR MAXIMUM ACCESS (MAS) POSTERIOR LUMBAR INTERBODY FUSION (PLIF) Lumbar Four-Five  . Esophageal manometry N/A 04/08/2015    Procedure: ESOPHAGEAL MANOMETRY (EM);  Surgeon: Louis Meckel, MD;  Location: [facility];  Service: Endoscopy;  Laterality: N/A;  . Esophagogastroduodenoscopy (egd) with propofol N/A 06/07/2015    Procedure: ESOPHAGOGASTRODUODENOSCOPY (EGD) WITH PROPOFOL;  Surgeon: Louis Meckel, MD;   Location: [facility];  Service: Endoscopy;  Laterality: N/A;  . Botox injection N/A 06/07/2015    Procedure: BOTOX INJECTION;  Surgeon: Louis Meckel, MD;  Location: [facility];  Service: Endoscopy;  Laterality: N/A;  . Colonoscopy with propofol N/A 06/07/2015    Procedure: COLONOSCOPY WITH PROPOFOL;  Surgeon: Louis Meckel, MD;  Location: [facility];  Service: Endoscopy;  Laterality: N/A;    Current Outpatient Prescriptions  Medication Sig Dispense Refill  . adalimumab (HUMIRA PEN) 40 MG/0.8ML injection Inject 40 mg into the skin every 14 (fourteen) days.     Marland Kitchen albuterol (ACCUNEB) 1.25 MG/3ML nebulizer solution Take 3 mLs by nebulization every 4 (four) hours as needed for wheezing or shortness of breath.   3  . albuterol (PROVENTIL HFA;VENTOLIN HFA) 108 (90 BASE) MCG/ACT inhaler Inhale 1-2 puffs into the lungs every 4 (four) hours as needed for wheezing or shortness of breath.    Marland Kitchen amitriptyline (ELAVIL) 25 MG tablet Take 50 mg by mouth at bedtime.     Marland Kitchen Apoaequorin (PREVAGEN) 10 MG CAPS Take 10 mg by mouth daily.    Marland Kitchen CALCIUM-VITAMIN D PO Take 1 tablet by mouth daily.     . Cyanocobalamin (B-12) 2500 MCG TABS Take 1 tablet by mouth daily.    Marland Kitchen dexlansoprazole (DEXILANT) 60 MG capsule Take 1 capsule (60 mg total) by mouth daily.      . ferrous sulfate 325 (65 FE) MG tablet Take 325 mg by mouth daily with breakfast.    . Fluticasone-Salmeterol (ADVAIR) 250-50 MCG/DOSE AEPB Inhale 1 puff into the lungs 2 (two) times daily.    . furosemide (LASIX) 20 MG tablet Take 20 mg by mouth every morning.     . gabapentin (NEURONTIN) 300 MG capsule Take 300-600 mg by mouth 5 (five) times daily. 1 capsule QID and 2 caps  at bedtime    . losartan (COZAAR) 50 MG tablet Take 50 mg by mouth every morning.     . magnesium gluconate (MAGONATE) 500 MG tablet Take 500 mg by mouth daily.    . methocarbamol (ROBAXIN) 500 MG tablet Take 500 mg by mouth 3 (three) times daily.    . montelukast (SINGULAIR) 10 MG  tablet Take 10 mg by mouth daily with breakfast.     . Multiple Vitamins-Minerals (MULTIVITAMIN PO) Take 1 tablet by mouth daily.     . nitroGLYCERIN (NITROSTAT) 0.4 MG SL tablet Place 1 tablet (0.4 mg total) under the tongue every 5 (five) minutes x 3 doses as needed for chest pain. 25 tablet 3  . norethindrone-ethinyl estradiol (JUNEL FE,GILDESS FE,LOESTRIN FE) 1-20 MG-MCG tablet Take 1 tablet by mouth at bedtime.     Marland Kitchen  oxyCODONE-acetaminophen (PERCOCET/ROXICET) 5-325 MG tablet Take 1 tablet by mouth 5 (five) times daily.    . polyethylene glycol (MIRALAX) powder Take 17 g by mouth daily.     . potassium citrate (UROCIT-K) 10 MEQ (1080 MG) SR tablet Take 10 mEq by mouth 2 (two) times daily.     Marland Kitchen PRESCRIPTION MEDICATION once. Nerve injection at pain management    . PRISTIQ 50 MG 24 hr tablet Take 50 mg by mouth daily.  3  . rosuvastatin (CRESTOR) 10 MG tablet Take 5 mg by mouth every morning.     . verapamil (COVERA HS) 240 MG (CO) 24 hr tablet Take 240 mg by mouth every morning.     Marland Kitchen BAYER CONTOUR TEST test strip PT CHECKS SUGARS TWICE DAILY DX E11.29  5   No current facility-administered medications for this visit.    Allergies:   Penicillins; Sulfonamide derivatives; Clindamycin/lincomycin; and Ciprofloxacin    Social History:  The patient  reports that she quit smoking about 32 years ago. Her smoking use included Cigarettes. She smoked 2.00 packs per day. She has never used smokeless tobacco. She reports that she does not drink alcohol or use illicit drugs.   Family History:  The patient's family history includes Brain cancer in her mother; Breast cancer in her maternal aunt and mother; Diabetes in her mother; Heart attack in her father and sister; Heart disease in her father, mother, and sister; Hyperlipidemia in her father, mother, and sister; Hypertension in her father, mother, and sister; Lung cancer in her mother; Skin cancer in her father and mother. There is no history of Colon  cancer, Colon polyps, Esophageal cancer, or Gallbladder disease.    ROS:  Please see the history of present illness. All other systems are reviewed and negative.    PHYSICAL EXAM: VS:  BP 190/120 mmHg  Pulse 89  Ht 5' 6.5" (1.689 m)  Wt 225 lb (102.059 kg)  BMI 35.78 kg/m2 , BMI Body mass index is 35.78 kg/(m^2). GEN: Well nourished, well developed, female in no acute distress HEENT: normal for age  Neck: no JVD, no carotid bruit, no masses Cardiac: RRR;  Soft murmur, no rubs, or gallops Respiratory:  clear to auscultation bilaterally, normal work of breathing GI: soft, tender, nondistended, + BS MS: no deformity or atrophy; no edema; distal pulses are 2+ in all 4 extremities  Skin: warm and dry, no rash Neuro:  Strength and sensation are intact Psych: euthymic mood, full affect   EKG:  EKG is ordered today. The ekg ordered today demonstrates  Sinus rhythm, rate 89, nonspecific ST and T-wave abnormality that is minimally changed from previous ECG  MV 09/26/2015  The left ventricular ejection fraction is normal (55-65%).  Nuclear stress EF: 62%.  There was no ST segment deviation noted during stress.  The study is normal.  This is a low risk study. 1. Low risk study 2. Nl perfusion and EF  Recent Labs: 08/29/2015: BUN 21*; Creatinine, Ser 1.13*; Hemoglobin 12.1; Platelets 226; Potassium 3.9; Sodium 138    Lipid Panel    Component Value Date/Time   CHOL 119 01/06/2013 0936   TRIG 113 01/06/2013 0936   HDL 36* 01/06/2013 0936   CHOLHDL 3.3 01/06/2013 0936   VLDL 23 01/06/2013 0936   LDLCALC 60 01/06/2013 0936     Wt Readings from Last 3 Encounters:  10/07/15 225 lb (102.059 kg)  09/26/15 226 lb (102.513 kg)  09/10/15 226 lb (102.513 kg)     Other  studies Reviewed: Additional studies/ records that were reviewed today include:  Previous office notes and testing.  ASSESSMENT AND PLAN:  1.   Chest pain:  Her symptoms are not typical for cardiac. Her ECG  has some minor T-wave changes but Dr. Rennis Golden reviewed this and felt they were not significant. We will  Increase her GI medications and encourage her to follow up with her GI physician.  Until she is able to follow-up with GI, it is okay to increase her to increase Dexilant to twice a day and add Carafate every before meals and at bedtime. She is to call the pharmacy that has the GI cocktail to see if she can get both doses just to try it. It is cost prohibitive to use regularly, but she might be able to get one or 2 when her symptoms become severe if they are helpful to her.   Follow-up with cardiology but hopefully better treatment of her GI symptoms will resulted in improvement.  Current medicines are reviewed at length with the patient today.  The patient does not have concerns regarding medicines.  The following changes have been made:   Okay to DC nitroglycerin, increased Dexilant and add Carafate  Labs/ tests ordered today include:   Orders Placed This Encounter  Procedures  . EKG 12-Lead     Disposition:   FU with  Dr. Tresa Endo  Signed, Leanna Battles  10/07/2015 3:11 PM    [facility] [address] [place], [place], [place]  [address] Phone: [phone]; Fax: [phone]

## 2015-10-07 NOTE — Patient Instructions (Signed)
Medication Instructions:   INCREASE DEXILIANT TO 2 TIMES DAILY  START CARAFATE 1 TABLET WITH MEALS AND AT BEDTIME X ONE MONTH  Follow-Up:  Your physician recommends that you schedule a follow-up appointment in: 1ST AVAILABLE WITH DR Tresa EndoKELLY   If you need a refill on your cardiac medications before your next appointment, please call your pharmacy.

## 2015-11-02 ENCOUNTER — Other Ambulatory Visit: Payer: Self-pay | Admitting: Physician Assistant

## 2015-11-04 ENCOUNTER — Telehealth: Payer: Self-pay | Admitting: Physician Assistant

## 2015-11-04 DIAGNOSIS — R079 Chest pain, unspecified: Secondary | ICD-10-CM

## 2015-11-04 MED ORDER — SUCRALFATE 1 G PO TABS: 1.0000 g | ORAL_TABLET | Freq: Three times a day (TID) | ORAL | Status: DC

## 2015-11-04 MED ORDER — SUCRALFATE 1 G PO TABS
1.0000 g | ORAL_TABLET | Freq: Three times a day (TID) | ORAL | Status: DC
Start: 2015-11-04 — End: 2015-11-04

## 2015-11-04 NOTE — Telephone Encounter (Signed)
Pt notes carafate helping, still trying to f/u w GI but notes that her complaints have lessened since being on medication. She requested 90 day supply or pharmacy would not fill. Informed her this was provided. Asked her to make sure she follows up asap w GI on this. She does have appt on 2/24. She notes she called us to refill bc her primary care would not - we provided initial fill for the Carafate.

## 2015-11-04 NOTE — Telephone Encounter (Signed)
Pt need to have her Sucralfate one more time until she see her GI. She has an appt on 12-13-15. Please call this to [phone].Please call pt,she need to discuss another matter. If she does not answer,she will call you back asap.

## 2015-12-04 ENCOUNTER — Ambulatory Visit (INDEPENDENT_AMBULATORY_CARE_PROVIDER_SITE_OTHER): Payer: Managed Care, Other (non HMO) | Admitting: Pulmonary Disease

## 2015-12-04 ENCOUNTER — Encounter: Payer: Self-pay | Admitting: Pulmonary Disease

## 2015-12-04 VITALS — BP 128/70 | HR 89 | Ht 66.5 in | Wt 229.2 lb

## 2015-12-04 DIAGNOSIS — J387 Other diseases of larynx: Secondary | ICD-10-CM | POA: Diagnosis not present

## 2015-12-04 DIAGNOSIS — J4530 Mild persistent asthma, uncomplicated: Secondary | ICD-10-CM

## 2015-12-04 DIAGNOSIS — R053 Chronic cough: Secondary | ICD-10-CM

## 2015-12-04 DIAGNOSIS — R05 Cough: Secondary | ICD-10-CM

## 2015-12-04 DIAGNOSIS — K219 Gastro-esophageal reflux disease without esophagitis: Secondary | ICD-10-CM

## 2015-12-04 DIAGNOSIS — R058 Other specified cough: Secondary | ICD-10-CM

## 2015-12-04 NOTE — Progress Notes (Signed)
Chief Complaint  Patient presents with  . Follow-up    pt. states breathing has improved. denies SOB, cough, wheezing or chest pain/tightness.    History of Present Illness: Kymia Simi is a 50 y.o. female former smoker with chronic cough, asthma, upper airway cough and GERD.  She was last seen by Dr. Craige Cotta 03/04/2015. Had a cold in January that resolved after two weeks. She had wheezing with it, relieved by nebulizer treatments. Has not used albuterol since then. She is taking Dexilant BID, Advair BID, Singulair daily, and carafate QID.  She is doing better.  She is not having cough, wheeze, sputum, dyspnea or chest pain. No rhinorrhea, sinus problems, nasal congestion, post nasal drip.  GERD symptoms are a lot less frequent since she started the carafate.  CXR from 08/29/15 with no active cardiopulm disease. Right aortic arch again noted..  TESTS: PFT 08/12/07 >> FEV1 2.97(104%), FEV1% 84, TLC 4.89(90%), DLCO 84%, +BD  PMHx >> HTN, HLD, Panic attacks, Depression, Psoriasis, DM, Peripheral neuropathy, GERD, HH  Past surgical hx, Medications, Allergies, Family hx, Social hx all reviewed.  Physical Exam: Today's Vitals   12/04/15 1638  BP: 128/70  Pulse: 89  Height: 5' 6.5" (1.689 m)  Weight: 229 lb 3.2 oz (103.964 kg)  SpO2: 98%   General - No distress ENT - No sinus tenderness, no oral exudate, no LAN Cardiac - s1s2 regular, no murmur Chest - No wheeze/rales/dullness Back - No focal tenderness Abd - Soft, non-tender Ext - No edema Neuro - Normal strength Skin - No rashes Psych - normal mood, and behavior   Assessment/Plan: 50 year old woman with chronic cough secondary to asthma, UACS, GERD. Her cough remains improved.  Her worst time of year is December to March. She feels that this year has been better than last year.  Asthma - Continue Advair BID, Singulair daily and prn albuterol  Upper airway cough syndrome - Continue fexofenadine - Might need additional  allergy testing at some point  GERD, megaesophagus: seen by GI 07/24/15. She has nonspecific motility disorder. Doing better on carafate. Plan: - She is followed by Dr. Lavon Paganini at [facility]. She has an appointment 12/13/2015. - Continue carafate QID and Dexilant BID  Griffin Basil, MD  Internal Medicine PGY-2  Reviewed above.  Her cough and breathing have been stable.  She is not having sputum production or wheezing.  She has not needed to use albuterol since she had URI in January 2017.  No sinus tenderness, no wheeze, HR regular.  Assessment/plan: Chronic cough 2nd to asthma, UACS and LPR. - continue advair, singulair with prn albuterol - continue fexofenadine - continue dexilant, carafate per GI  Coralyn Helling, MD [facility] Pulmonary/Critical Care 12/05/2015, 8:34 AM Pager:  [phone] After 3pm call: [phone]

## 2015-12-04 NOTE — Patient Instructions (Signed)
Continue your Advair twice a day, Singulair daily and albuterol as needed for wheezing or shortness of breath as you have been Continue your fexofenadine daily Follow up in 6 months or sooner if you have any problems.

## 2015-12-13 ENCOUNTER — Encounter: Payer: Self-pay | Admitting: Gastroenterology

## 2015-12-13 ENCOUNTER — Ambulatory Visit (INDEPENDENT_AMBULATORY_CARE_PROVIDER_SITE_OTHER): Payer: Managed Care, Other (non HMO) | Admitting: Gastroenterology

## 2015-12-13 VITALS — BP 124/80 | HR 88 | Ht 66.25 in | Wt 226.4 lb

## 2015-12-13 DIAGNOSIS — R079 Chest pain, unspecified: Secondary | ICD-10-CM | POA: Diagnosis not present

## 2015-12-13 DIAGNOSIS — R1310 Dysphagia, unspecified: Secondary | ICD-10-CM | POA: Diagnosis not present

## 2015-12-13 DIAGNOSIS — K219 Gastro-esophageal reflux disease without esophagitis: Secondary | ICD-10-CM

## 2015-12-13 DIAGNOSIS — K224 Dyskinesia of esophagus: Secondary | ICD-10-CM

## 2015-12-13 MED ORDER — DEXLANSOPRAZOLE 60 MG PO CPDR: 60.0000 mg | DELAYED_RELEASE_CAPSULE | Freq: Two times a day (BID) | ORAL | Status: DC

## 2015-12-13 MED ORDER — SUCRALFATE 1 G PO TABS: 1.0000 g | ORAL_TABLET | Freq: Three times a day (TID) | ORAL | Status: DC

## 2015-12-13 NOTE — Patient Instructions (Addendum)
It has been recommended to you by your physician that you have a(n) Endoscopy completed. Per your request, we did not schedule the procedure(s) today. Please contact our office at [phone] should you decide to have the procedure completed.  We will refill your medications today (Sucralfate and Dexilant )  Follow up after you have your EGD  scheduled in June

## 2015-12-23 ENCOUNTER — Ambulatory Visit: Payer: Managed Care, Other (non HMO) | Admitting: Cardiovascular Disease

## 2016-01-10 ENCOUNTER — Ambulatory Visit (AMBULATORY_SURGERY_CENTER): Payer: Self-pay | Admitting: *Deleted

## 2016-01-10 VITALS — Ht 66.5 in | Wt 226.0 lb

## 2016-01-10 DIAGNOSIS — R079 Chest pain, unspecified: Secondary | ICD-10-CM

## 2016-01-10 NOTE — Progress Notes (Signed)
No egg or soy allergy. No anesthesia problems.  No home O2.  No diet meds.  

## 2016-01-15 NOTE — Progress Notes (Signed)
Dominique Evans    [id number]    [birth year]  Primary Care Physician:Richard Laymond Purser, MD  Referring Physician: [name], MD [address] [place], [place] [address]  Chief complaint:  Chest pain HPI: 50 year old female with chronic GERD, atypical chest pain and chronic cough previously followed by Dr. Arlyce Dice is here for follow-up visit. 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 Upper GI series in 2012 also demonstrated esophageal dysmotility. She continues to have some breakthrough GERD though better with Dexilant and Carafate. Her predominant complaints atypical chest pain/spasms and sometimes chokes on both liquids and solids. No weight loss. Denies any nausea, vomiting, abdominal pain, melena or bright red blood per rectum    Outpatient Encounter Prescriptions as of 12/13/2015  Medication Sig  . adalimumab (HUMIRA PEN) 40 MG/0.8ML injection Inject 40 mg into the skin every 14 (fourteen) days.   Marland Kitchen albuterol (ACCUNEB) 1.25 MG/3ML nebulizer solution Take 3 mLs by nebulization every 4 (four) hours as needed for wheezing or shortness of breath.   Marland Kitchen albuterol (PROVENTIL HFA;VENTOLIN HFA) 108 (90 BASE) MCG/ACT inhaler Inhale 1-2 puffs into the lungs every 4 (four) hours as needed for wheezing or shortness of breath.  Marland Kitchen amitriptyline (ELAVIL) 25 MG tablet Take 50 mg by mouth at bedtime.   Marland Kitchen Apoaequorin (PREVAGEN) 10 MG CAPS Take 10 mg by mouth daily.  Marland Kitchen BAYER CONTOUR TEST test strip PT CHECKS SUGARS TWICE DAILY DX E11.29  . CALCIUM-VITAMIN D PO Take 1 tablet by mouth daily.   . Cyanocobalamin (B-12) 2500 MCG TABS Take 1 tablet by mouth daily.  Marland Kitchen dexlansoprazole (DEXILANT) 60 MG capsule Take 1 capsule (60 mg total) by mouth 2 (two) times daily.  . ferrous sulfate 325 (65 FE) MG tablet Take 325 mg by mouth daily with breakfast.  . Fluticasone-Salmeterol (ADVAIR)  250-50 MCG/DOSE AEPB Inhale 1 puff into the lungs 2 (two) times daily.  . furosemide (LASIX) 20 MG tablet Take 20 mg by mouth every morning.   . gabapentin (NEURONTIN) 300 MG capsule Take 300-600 mg by mouth 5 (five) times daily. 1 capsule QID and 2 caps  at bedtime  . losartan (COZAAR) 50 MG tablet Take 50 mg by mouth every morning.   . methocarbamol (ROBAXIN) 750 MG tablet Take 750 mg by mouth 3 (three) times daily.  . montelukast (SINGULAIR) 10 MG tablet Take 10 mg by mouth daily with breakfast.   . Multiple Vitamins-Minerals (MULTIVITAMIN PO) Take 1 tablet by mouth daily.   . norethindrone-ethinyl estradiol (JUNEL FE,GILDESS FE,LOESTRIN FE) 1-20 MG-MCG tablet Take 1 tablet by mouth at bedtime.   Marland Kitchen oxyCODONE-acetaminophen (PERCOCET/ROXICET) 5-325 MG tablet Take 1 tablet by mouth 5 (five) times daily.  . polyethylene glycol (MIRALAX) powder Take 17 g by mouth daily.   . potassium citrate (UROCIT-K) 10 MEQ (1080 MG) SR tablet Take 10 mEq by mouth 2 (two) times daily.   Marland Kitchen PRISTIQ 50 MG 24 hr tablet Take 50 mg by mouth daily.  . rosuvastatin (CRESTOR) 10 MG tablet Take 5 mg by mouth every morning.   . sucralfate (CARAFATE) 1 g tablet Take 1 tablet (1 g total) by mouth 4 (four) times daily -  with meals and at bedtime.  . verapamil (COVERA HS) 240 MG (CO) 24 hr tablet Take 240 mg by mouth every morning.   . [DISCONTINUED] dexlansoprazole (DEXILANT) 60 MG capsule Take  1 capsule (60 mg total) by mouth 2 (two) times daily.  . [DISCONTINUED] magnesium gluconate (MAGONATE) 500 MG tablet Take 500 mg by mouth daily.  . [DISCONTINUED] PRESCRIPTION MEDICATION once. Nerve injection at pain management  . [DISCONTINUED] sucralfate (CARAFATE) 1 g tablet Take 1 tablet (1 g total) by mouth 4 (four) times daily -  with meals and at bedtime.  . [DISCONTINUED] methocarbamol (ROBAXIN) 500 MG tablet Take 500 mg by mouth 3 (three) times daily.   No facility-administered encounter medications on file as of 12/13/2015.      Allergies as of 12/13/2015 - Review Complete 12/13/2015  Allergen Reaction Noted  . Penicillins Anaphylaxis   . Sulfonamide derivatives Anaphylaxis   . Clindamycin/lincomycin Nausea And Vomiting 06/11/2015  . Ciprofloxacin Hives 01/05/2013    Past Medical History  Diagnosis Date  . Unspecified essential hypertension   . GERD (gastroesophageal reflux disease)   . 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 Chronic constipation   . Obesity   . Asthma     PFT 08/12/07: FEV1 2.97 (104%), FEV1% 84, TLC 4.89 (90%), DLCO 84%, +BD  . Allergic rhinitis   . Panic attacks   . Hydatidiform mole   . Visual disturbance   . PONV (postoperative nausea and vomiting)   . Anemia   . Blood transfusion     hx of 2002   . Depression   . Psoriasis   . Hernia     LLQ (after kidney removed)  . Cough 03/23/13  . History of stress test     exercise stress test, ? where it was done,  about 6 yrs. ago,  results- negative   . H/O hiatal hernia   . Status post dilation of esophageal narrowing   . Hypertension   . Hyperlipidemia   . Diabetes mellitus     pre gastric bypass  . Arthritis     spondylolisthesis- lumbar region  . Seasonal allergies   . Blood transfusion without reported diagnosis     kidney, no issues  . 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 Peripheral neuropathy (HCC)     bilateral feet  . Esophageal dysmotility 03/01/2015    Past Surgical History  Procedure Laterality Date  . Nephrectomy  2002    left  . Elbow fracture surgery      left  . Shoulder arthroscopy      right shoulder  . Laparoscopic gastric banding  02/2006    w/ truncal vagotomy  . Patent ductus arterious repair    . Lap band removal     . 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 Lumbar  laminectomy/decompression microdiscectomy Left 11/30/2012    Procedure: MICRO LUMBAR DECOMPRESSION L4-5 ON THE LEFT;  Surgeon: Javier Docker, MD;  Location: [facility];  Service: Orthopedics;  Laterality: Left;  MICRO LUMBAR DECOMPRESSION L4-5 ON THE LEFT  . Lumbar laminectomy/decompression microdiscectomy N/A 03/30/2013    Procedure: REDO MICRO LUMBAR DECOMPRESSION L4-5;  Surgeon: Javier Docker, MD;  Location: [facility];  Service: Orthopedics;  Laterality: N/A;  . Maximum access (mas)posterior lumbar interbody fusion (plif) 1 level N/A 05/23/2014    Procedure: FOR MAXIMUM ACCESS (MAS) POSTERIOR LUMBAR INTERBODY FUSION (PLIF) Lumbar Four-Five;  Surgeon: Tia Alert, MD;  Location: [facility];  Service: Neurosurgery;  Laterality: N/A;  FOR MAXIMUM ACCESS (MAS) POSTERIOR LUMBAR INTERBODY FUSION (PLIF) Lumbar Four-Five  . Esophageal manometry N/A 04/08/2015    Procedure: ESOPHAGEAL MANOMETRY (EM);  Surgeon: Louis Meckel, MD;  Location: [facility];  Service: Endoscopy;  Laterality: N/A;  . Esophagogastroduodenoscopy (egd) with propofol N/A 06/07/2015    Procedure: ESOPHAGOGASTRODUODENOSCOPY (EGD) WITH PROPOFOL;  Surgeon: Louis Meckel, MD;  Location: [facility];  Service: Endoscopy;  Laterality: N/A;  . Botox injection N/A 06/07/2015    Procedure: BOTOX INJECTION;  Surgeon: Louis Meckel, MD;  Location: [facility];  Service: Endoscopy;  Laterality: N/A;  . Colonoscopy with propofol N/A 06/07/2015    Procedure: COLONOSCOPY WITH PROPOFOL;  Surgeon: Louis Meckel, MD;  Location: [facility];  Service: Endoscopy;  Laterality: N/A;    Family History  Problem Relation Age of Onset  . Hypertension Father   . Skin cancer Father   . Heart disease Father     before age 63  . Heart attack Father   . Hyperlipidemia Father   . Diabetes Mother   . Hypertension Mother   . Skin cancer Mother   . Breast cancer Mother     Melanoma  . Heart disease Mother   . Hyperlipidemia Mother   . Lung cancer  Mother   . Brain cancer Mother   . Hypertension Sister   . Heart disease Sister     before age 27  . Heart attack Sister   . Hyperlipidemia Sister   . Breast cancer Maternal Aunt   . Colon cancer Neg Hx   . Colon polyps Neg Hx   . Esophageal cancer Neg Hx   . Gallbladder disease Neg Hx   . Stomach cancer Neg Hx     Social History   Social History  . Marital Status: Divorced    Spouse Name: N/A  . Number of Children: 3  . Years of Education: N/A   Occupational History  . Sr. Health Concier[employer]   Social History Main Topics  . Smoking status: Former Smoker -- 2.00 packs/day    Types: Cigarettes    Quit date: 10/19/1982  . Smokeless tobacco: Never Used  . Alcohol Use: No  . Drug Use: No  . Sexual Activity: Not on file   Other Topics Concern  . Not on file   Social History Narrative   Getting divorced      Review of systems: Review of Systems  Constitutional: Negative for fever and chills.  HENT: Negative.   Eyes: Negative for blurred vision.  Respiratory: Negative for cough, shortness of breath and wheezing.   Cardiovascular: Negative for chest pain and palpitations.  Gastrointestinal: as per HPI Genitourinary: Negative for dysuria, urgency, frequency and hematuria.  Musculoskeletal: Negative for myalgias, back pain and joint pain.  Skin: Negative for itching and rash.  Neurological: Negative for dizziness, tremors, focal weakness, seizures and loss of consciousness.  Endo/Heme/Allergies: Negative for environmental allergies.  Psychiatric/Behavioral: Negative for depression, suicidal ideas and hallucinations.  All other systems reviewed and are negative.   Physical Exam: Filed Vitals:   12/13/15 0914  BP: 124/80  Pulse: 88   Gen:      No acute distress HEENT:  EOMI, sclera anicteric Neck:     No masses; no thyromegaly Lungs:    Clear to auscultation bilaterally; normal respiratory effort CV:         Regular rate and rhythm; no  murmurs Abd:      +  bowel sounds; soft, non-tender; no palpable masses, no distension Ext:    No edema; adequate peripheral perfusion Skin:      Warm and dry; no rash Neuro: alert and oriented x 3 Psych: normal mood and affect  Data Reviewed: Colonoscopy 05/2015 Normal colonoscopy (limited exam due to prep) RECOMMENDATIONS: Colonoscopy in 5 years in view of limitations on exam  EGD 02/2015 diffusely dilated esophagus with patent GE junction Esophagitis  Esophageal manometry 03/2015 EGJ outflow obstruction ( 30% failed, 30% rapid contraction) and also some instance of weak and ineffective peristalsis  Assessment and Plan/Recommendations: 50 year old female with history of chronic GERD, esophageal dysmotility with evidence of EGJ outflow obstruction on esophageal manometry with intermittent atypical chest pain here for follow-up visit. She has had cardiac workup and chest pain is thought to be not cardiac related; likely secondary to esophageal dysmotility with a EGJ of obstruction  We'll schedule for EGD with EG junction dilation Continue Dexilant and Carafate as needed Follow antireflux measures Her last colonoscopy in August 2016 was limited due to fair prep, due for recall colonoscopy in 2021 Return after EGD  K. Scherry RanVeena Seanpaul Preece , MD [phone] Mon-Fri 8a-5p [phone] after 5p, weekends, holidays

## 2016-01-24 ENCOUNTER — Ambulatory Visit (AMBULATORY_SURGERY_CENTER): Payer: Managed Care, Other (non HMO) | Admitting: Gastroenterology

## 2016-01-24 ENCOUNTER — Encounter: Payer: Self-pay | Admitting: Gastroenterology

## 2016-01-24 VITALS — BP 187/89 | HR 76 | Temp 98.9°F | Resp 20 | Ht 66.25 in | Wt 226.0 lb

## 2016-01-24 DIAGNOSIS — R1310 Dysphagia, unspecified: Secondary | ICD-10-CM

## 2016-01-24 DIAGNOSIS — R079 Chest pain, unspecified: Secondary | ICD-10-CM

## 2016-01-24 DIAGNOSIS — K222 Esophageal obstruction: Secondary | ICD-10-CM | POA: Diagnosis not present

## 2016-01-24 DIAGNOSIS — T18128A Food in esophagus causing other injury, initial encounter: Secondary | ICD-10-CM

## 2016-01-24 MED ORDER — SODIUM CHLORIDE 0.9 % IV SOLN: 500.0000 mL | INTRAVENOUS | Status: DC

## 2016-01-24 MED ORDER — FLUCONAZOLE 100 MG PO TABS: 100.0000 mg | ORAL_TABLET | Freq: Every day | ORAL | Status: DC

## 2016-01-24 NOTE — Progress Notes (Signed)
Called to room to assist during endoscopic procedure.  Patient ID and intended procedure confirmed with present staff. Received instructions for my participation in the procedure from the performing physician.  

## 2016-01-24 NOTE — Op Note (Signed)
[facility] Patient Name: Dominique Evans Procedure Date: 01/24/2016 10:22 AM MRN: [id number] Endoscopist: Napoleon Form , MD Age: 50 Date of Birth: [birth year] Gender: Female Procedure:                Upper GI endoscopy Indications:              Dysphagia Medicines:                Monitored Anesthesia Care Procedure:                Pre-Anesthesia Assessment:                           - Prior to the procedure, a History and Physical                            was performed, and patient medications and                            allergies were reviewed. The patient's tolerance of                            previous anesthesia was also reviewed. The risks                            and benefits of the procedure and the sedation                            options and risks were discussed with the patient.                            All questions were answered, and informed consent                            was obtained. Prior Anticoagulants: The patient has                            taken no previous anticoagulant or antiplatelet                            agents. ASA Grade Assessment: III - A patient with                            severe systemic disease. After reviewing the risks                            and benefits, the patient was deemed in                            satisfactory condition to undergo the procedure.                           After obtaining informed consent, the endoscope was  passed under direct vision. Throughout the                            procedure, the patient's blood pressure, pulse, and                            oxygen saturations were monitored continuously. The                            Model GIF-HQ190 [id number]) scope was introduced                            through the mouth, and advanced to the jejunum. The                            upper GI endoscopy was somewhat difficult due to      presence of food and presence of bezoar. Successful                            completion of the procedure was aided by performing                            the maneuvers documented (below) in this report.                            The patient tolerated the procedure well. Scope In: Scope Out: Findings:                 LA Grade A (one or more mucosal breaks less than 5                            mm, not extending between tops of 2 mucosal folds)                            esophagitis with no bleeding was found 25 to 35 cm                            from the incisors. Dilated esophagus filled with                            secretions and food, suctioned. SCJ at 35cm                           Gastric remant pouch filled with bezoar removed                            with roth net and noted narrowing at gastrojejunal                            junction at 40cm. Dilation performed with TTS                            balloon 18-19-20mm with maximal dilation of  20mm.                            Jejunal limb appeared normal Complications:            No immediate complications. Estimated Blood Loss:     Estimated blood loss was minimal. Impression:               - LA Grade A candidiasis esophagitis. Dilated                            esophagus with short gastric pouch filled with food                            and bezoar. s/p dilation of gastric stricture to                            20mm.                           - No specimens collected. Recommendation:           - Patient has a contact number available for                            emergencies. The signs and symptoms of potential                            delayed complications were discussed with the                            patient. Return to normal activities tomorrow.                            Written discharge instructions were provided to the                            patient.                           - Mechanical soft  diet for the rest of the                            patient's life and anti reflux measures                           - Continue present medications.                           -Fluconazole  daily x 7 days                           - No repeat upper endoscopy.                           - Return to GI office in 3 months. Napoleon Form, MD 01/24/2016 11:05:27 AM This report has been signed electronically.

## 2016-01-24 NOTE — Progress Notes (Signed)
Report to PACU, RN, vss, BBS= Clear.  

## 2016-01-24 NOTE — Patient Instructions (Signed)
YOU HAD AN ENDOSCOPIC PROCEDURE TODAY AT THE [facility]:   Refer to the procedure report that was given to you for any specific questions about what was found during the examination.  If the procedure report does not answer your questions, please call your gastroenterologist to clarify.  If you requested that your care partner not be given the details of your procedure findings, then the procedure report has been included in a sealed envelope for you to review at your convenience later.  YOU SHOULD EXPECT: Some feelings of bloating in the abdomen. Passage of more gas than usual.  Walking can help get rid of the air that was put into your GI tract during the procedure and reduce the bloating. If you had a lower endoscopy (such as a colonoscopy or flexible sigmoidoscopy) you may notice spotting of blood in your stool or on the toilet paper. If you underwent a bowel prep for your procedure, you may not have a normal bowel movement for a few days.  Please Note:  You might notice some irritation and congestion in your nose or some drainage.  This is from the oxygen used during your procedure.  There is no need for concern and it should clear up in a day or so.  SYMPTOMS TO REPORT IMMEDIATELY:   Following lower endoscopy (colonoscopy or flexible sigmoidoscopy):  Excessive amounts of blood in the stool  Significant tenderness or worsening of abdominal pains  Swelling of the abdomen that is new, acute  Fever of 100F or higher   Following upper endoscopy (EGD)  Vomiting of blood or coffee ground material  New chest pain or pain under the shoulder blades  Painful or persistently difficult swallowing  New shortness of breath  Fever of 100F or higher  Black, tarry-looking stools  For urgent or emergent issues, a gastroenterologist can be reached at any hour by calling [phone].   DIET: Your first meal following the procedure should be a small meal and then it is ok to progress to  your normal diet. Do not eat any more that one cup of food per serving.  Try to eat a soft diet, and blend all of your food. No chunks of anything.  Drink plenty of fluids but you should avoid alcoholic beverages for 24 hours.  ACTIVITY:  You should plan to take it easy for the rest of today and you should NOT DRIVE or use heavy machinery until tomorrow (because of the sedation medicines used during the test).    FOLLOW UP: Our staff will call the number listed on your records the next business day following your procedure to check on you and address any questions or concerns that you may have regarding the information given to you following your procedure. If we do not reach you, we will leave a message.  However, if you are feeling well and you are not experiencing any problems, there is no need to return our call.  We will assume that you have returned to your regular daily activities without incident.  If any biopsies were taken you will be contacted by phone or by letter within the next 1-3 weeks.  Please call us at [phone] if you have not heard about the biopsies in 3 weeks.    SIGNATURES/CONFIDENTIALITY: You and/or your care partner have signed paperwork which will be entered into your electronic medical record.  These signatures attest to the fact that that the information above on your After Visit Summary has  been reviewed and is understood.  Full responsibility of the confidentiality of this discharge information lies with you and/or your care-partner.  Take your diflucan 2 tabs twice daily for 7 days to get rid of your yeast infection.

## 2016-01-27 ENCOUNTER — Telehealth: Payer: Self-pay | Admitting: *Deleted

## 2016-01-27 NOTE — Telephone Encounter (Signed)
  Follow up Call-  Call back number 01/24/2016 03/04/2015  Post procedure Call Back phone  # [phone]  Permission to leave phone message Yes Yes     Patient questions:  Do you have a fever, pain , or abdominal swelling? No. Pain Score  0 *  Have you tolerated food without any problems? Yes.    Have you been able to return to your normal activities? Yes.    Do you have any questions about your discharge instructions: Diet   No. Medications  Yes.   Follow up visit  Yes.    Do you have questions or concerns about your Care? No.  Actions: * If pain score is 4 or above: No action needed, pain <4.

## 2016-04-10 ENCOUNTER — Other Ambulatory Visit: Payer: Self-pay | Admitting: Physical Medicine and Rehabilitation

## 2016-04-10 DIAGNOSIS — G894 Chronic pain syndrome: Secondary | ICD-10-CM

## 2016-04-15 ENCOUNTER — Ambulatory Visit
Admission: RE | Admit: 2016-04-15 | Discharge: 2016-04-15 | Disposition: A | Discharge status: Home / Self Care | Payer: Managed Care, Other (non HMO) | Source: Ambulatory Visit | Attending: Physical Medicine and Rehabilitation | Admitting: Physical Medicine and Rehabilitation

## 2016-04-15 DIAGNOSIS — G894 Chronic pain syndrome: Secondary | ICD-10-CM

## 2016-04-15 MED ORDER — GADOBENATE DIMEGLUMINE 529 MG/ML IV SOLN
20.0000 mL | Freq: Once | INTRAVENOUS | Status: AC | PRN
  Administered 2016-04-15: 20 mL via INTRAVENOUS

## 2016-04-18 IMAGING — CT CT L SPINE W/O CM
4 of 10 series · 12 of 33 positions shown, 14 images · non-contrast
Comparison: Radiography 02/25/2015.  MRI 10/04/2014.

CLINICAL DATA: Low back pain and left leg pain. Weakness and
numbness. Previous lumbar decompression and fusion.

EXAM:
CT LUMBAR SPINE WITHOUT CONTRAST
TECHNIQUE: Multidetector CT imaging of the lumbar spine was performed without
intravenous contrast administration. Multiplanar CT image
reconstructions were also generated.

[Series 4: l spine bone · axial · 0.27mm/px · z∈[+69,+147]mm · 2 of 94 slices shown, 3 images]
[im 32/94  soft-tissue]
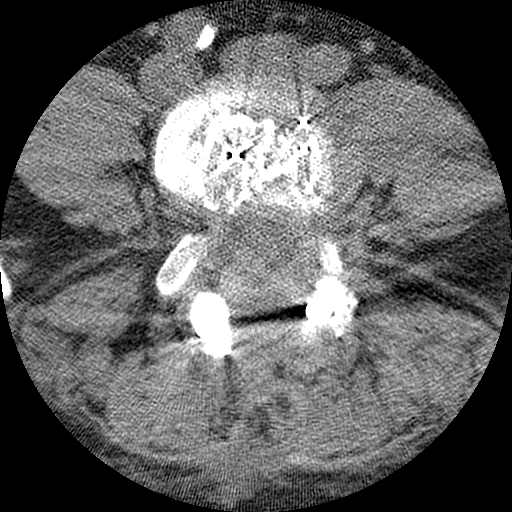
[im 32/94  bone]
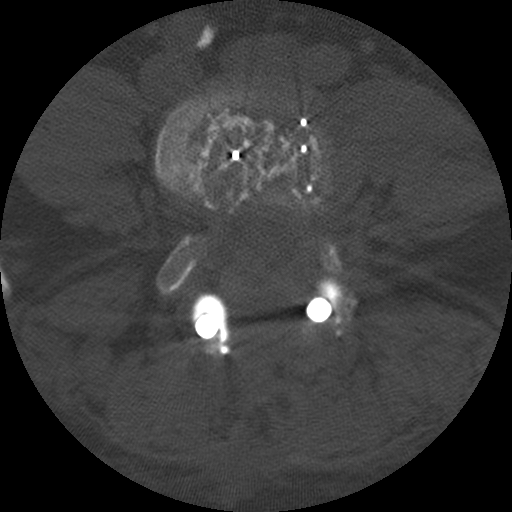
[im 63/94  bone]
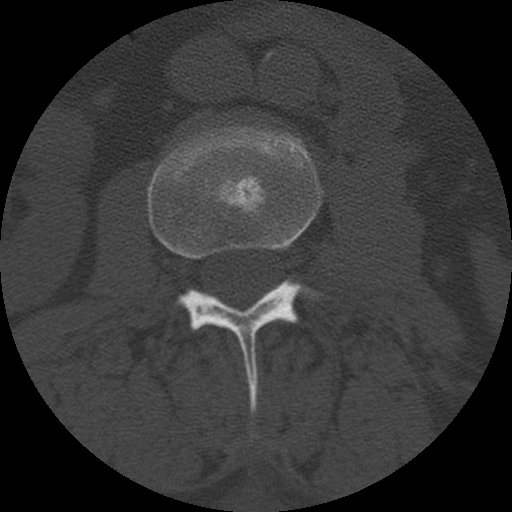

[Series 5: l-spine detail · axial · 0.27mm/px · z∈[+69,+147]mm · 2 of 94 slices shown]
[im 32/94  bone]
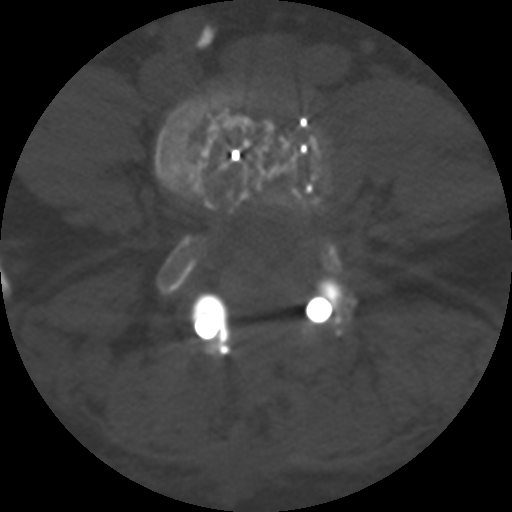
[im 63/94  bone]
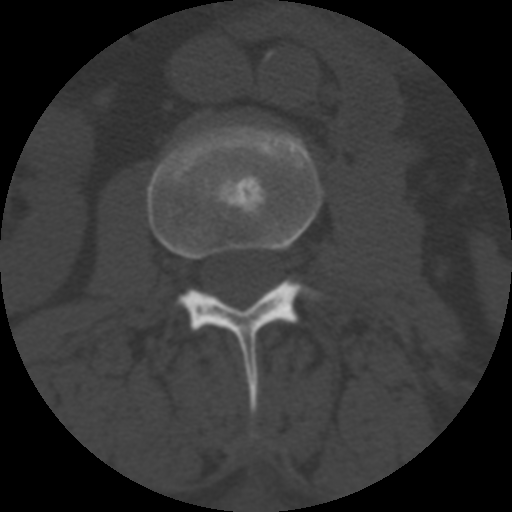

[Series 200: coronal · coronal · 0.47mm/px · 3 of 58 slices shown]
[im 12/58  bone]
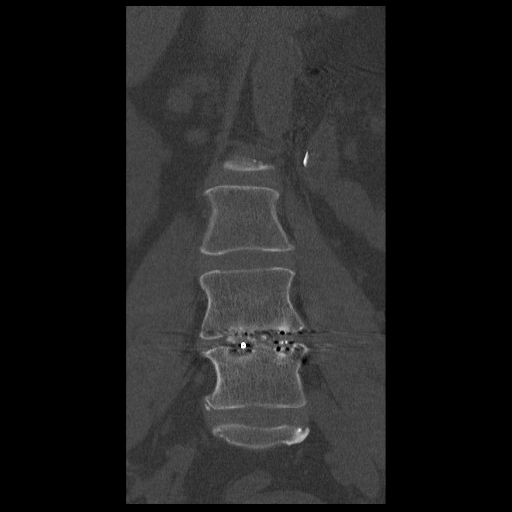
[im 23/58  bone]
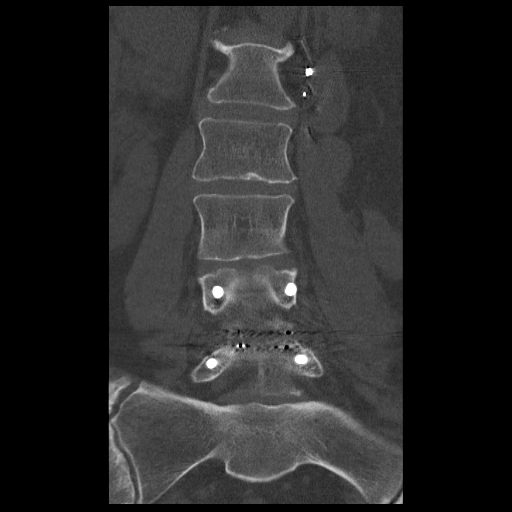
[im 35/58  bone]
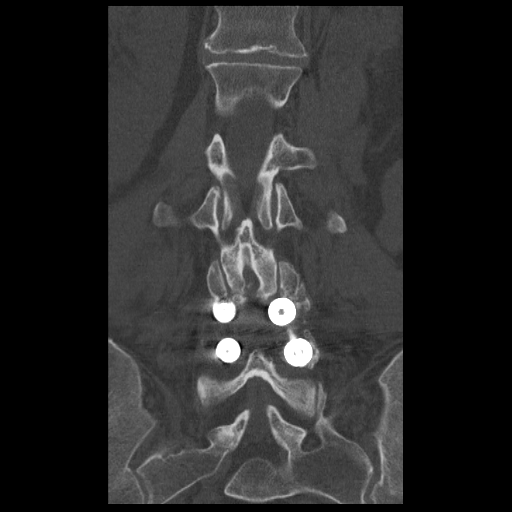

[Series 201: sagittal · sagittal · 0.47mm/px · 5 of 57 slices shown, 6 images]
[im 19/57  bone]
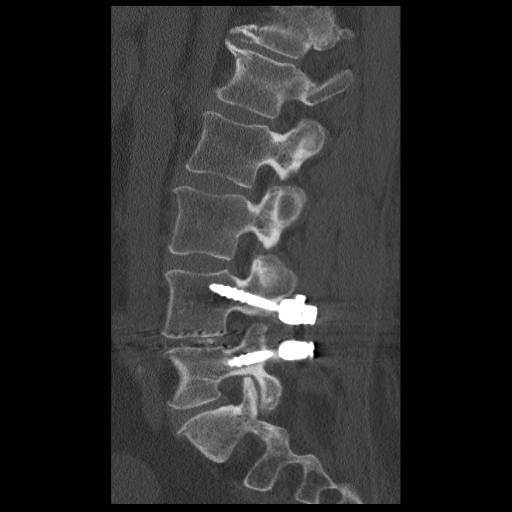
[im 24/57  bone]
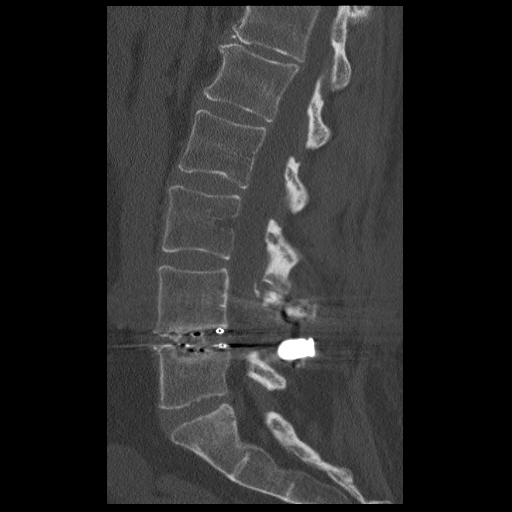
[im 29/57  soft-tissue]
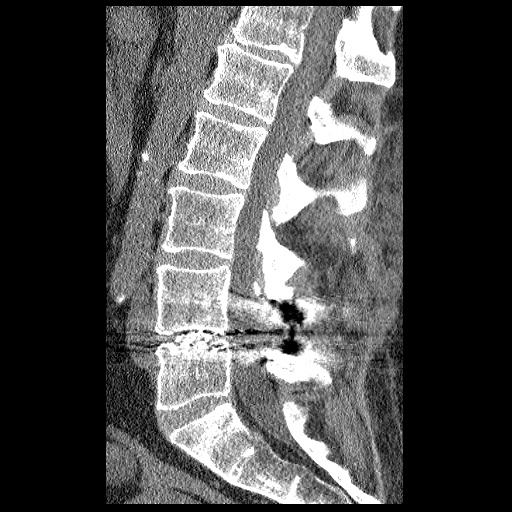
[im 29/57  bone]
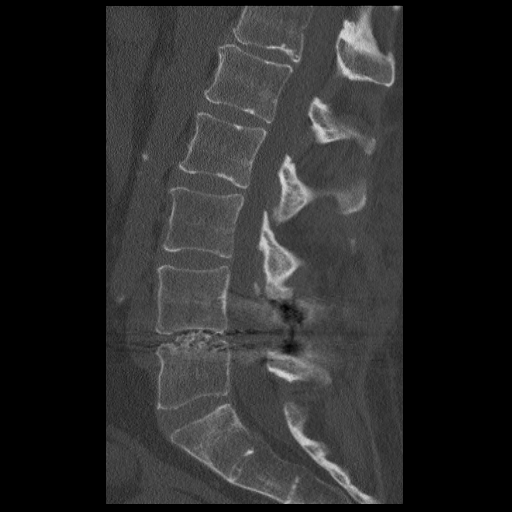
[im 33/57  bone]
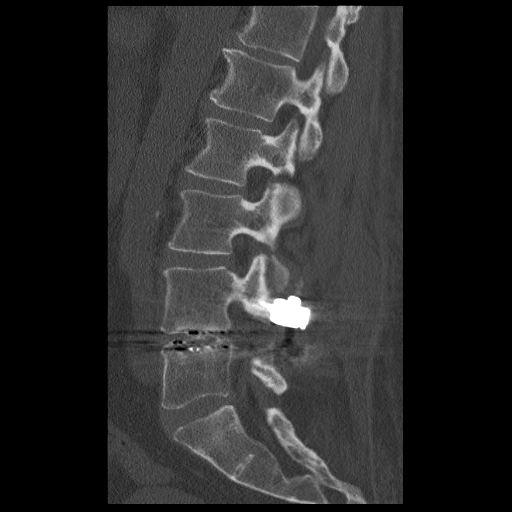
[im 38/57  bone]
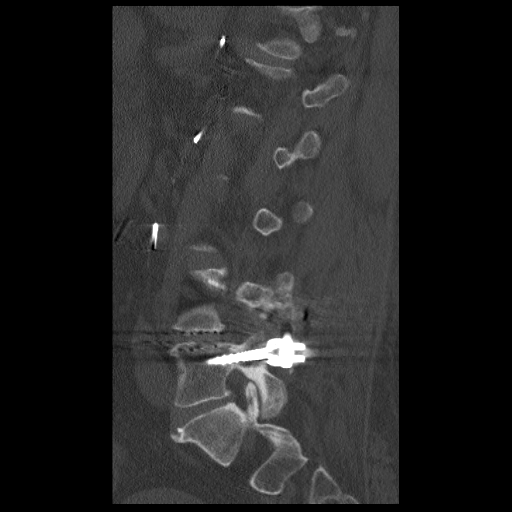

[12 of 33 positions shown; findings below may reference images not displayed]

FINDINGS: T12-L1:  Endplate osteophytes and bulging of the disc.  No stenosis.

L1-2:  Normal interspace.

L2-3:  Normal interspace.

L3-4:  Normal interspace.

L4-5: Previous decompression, diskectomy and fusion. No evidence of
nonunion. Wide patency of the canal and foramina. Hardware well
positioned.

L5-S1: Mild bulging of the disc. Mild facet degeneration. No canal
or foraminal stenosis.

Mild osteoarthritis of the sacroiliac joints.
IMPRESSION: Good appearance at the operative level of L4-5. No evidence of
nonunion or stenosis.

Chronic degenerative disc disease at T12-L1 without apparent neural
compression.

Mild disc bulge and facet degeneration at L5-S1 without apparent
neural compression.

Sacroiliac osteoarthritis which could be painful.

## 2016-07-28 ENCOUNTER — Ambulatory Visit (INDEPENDENT_AMBULATORY_CARE_PROVIDER_SITE_OTHER): Payer: Managed Care, Other (non HMO) | Admitting: Pulmonary Disease

## 2016-07-28 ENCOUNTER — Encounter: Payer: Self-pay | Admitting: Pulmonary Disease

## 2016-07-28 VITALS — BP 110/70 | HR 90 | Ht 66.5 in | Wt 220.2 lb

## 2016-07-28 DIAGNOSIS — R05 Cough: Secondary | ICD-10-CM | POA: Diagnosis not present

## 2016-07-28 DIAGNOSIS — R058 Other specified cough: Secondary | ICD-10-CM

## 2016-07-28 DIAGNOSIS — J4530 Mild persistent asthma, uncomplicated: Secondary | ICD-10-CM | POA: Diagnosis not present

## 2016-07-28 DIAGNOSIS — K219 Gastro-esophageal reflux disease without esophagitis: Secondary | ICD-10-CM | POA: Diagnosis not present

## 2016-07-28 DIAGNOSIS — R053 Chronic cough: Secondary | ICD-10-CM

## 2016-07-28 NOTE — Patient Instructions (Signed)
Follow up in 1 year.

## 2016-07-28 NOTE — Progress Notes (Signed)
Current Outpatient Prescriptions on File Prior to Visit  Medication Sig  . adalimumab (HUMIRA PEN) 40 MG/0.8ML injection Inject 40 mg into the skin every 14 (fourteen) days.   Marland Kitchen albuterol (ACCUNEB) 1.25 MG/3ML nebulizer solution Take 3 mLs by nebulization every 4 (four) hours as needed for wheezing or shortness of breath.   Marland Kitchen albuterol (PROVENTIL HFA;VENTOLIN HFA) 108 (90 BASE) MCG/ACT inhaler Inhale 1-2 puffs into the lungs every 4 (four) hours as needed for wheezing or shortness of breath.  Marland Kitchen amitriptyline (ELAVIL) 25 MG tablet Take 50 mg by mouth at bedtime.   Marland Kitchen Apoaequorin (PREVAGEN) 10 MG CAPS Take 10 mg by mouth daily.  Marland Kitchen BAYER CONTOUR TEST test strip PT CHECKS SUGARS TWICE DAILY DX E11.29  . CALCIUM-VITAMIN D PO Take 1 tablet by mouth daily.   Marland Kitchen dexlansoprazole (DEXILANT) 60 MG capsule Take 1 capsule (60 mg total) by mouth 2 (two) times daily.  . ferrous sulfate 325 (65 FE) MG tablet Take 325 mg by mouth daily with breakfast.  . fexofenadine (ALLEGRA) 180 MG tablet Take 180 mg by mouth daily.  . fluconazole (DIFLUCAN) 100 MG tablet Take 1 tablet (100 mg total) by mouth daily.  . Fluticasone-Salmeterol (ADVAIR) 250-50 MCG/DOSE AEPB Inhale 1 puff into the lungs 2 (two) times daily.  . furosemide (LASIX) 20 MG tablet Take 20 mg by mouth every morning.   Marland Kitchen losartan (COZAAR) 50 MG tablet Take 50 mg by mouth every morning.   . methocarbamol (ROBAXIN) 750 MG tablet Take 750 mg by mouth 3 (three) times daily.  . montelukast (SINGULAIR) 10 MG tablet Take 10 mg by mouth daily with breakfast.   . Multiple Vitamins-Minerals (MULTIVITAMIN PO) Take 1 tablet by mouth daily.   . norethindrone-ethinyl estradiol (JUNEL FE,GILDESS FE,LOESTRIN FE) 1-20 MG-MCG tablet Take 1 tablet by mouth at bedtime.   Marland Kitchen oxyCODONE-acetaminophen (PERCOCET/ROXICET) 5-325 MG tablet Take 1 tablet by mouth 6 (six) times daily.   . polyethylene glycol (MIRALAX) powder Take 17 g by mouth daily.   . potassium citrate (UROCIT-K) 10  MEQ (1080 MG) SR tablet Take 10 mEq by mouth 2 (two) times daily.   Marland Kitchen PRISTIQ 50 MG 24 hr tablet Take 50 mg by mouth daily.  . rosuvastatin (CRESTOR) 10 MG tablet Take 5 mg by mouth every morning.   . verapamil (COVERA HS) 240 MG (CO) 24 hr tablet Take 240 mg by mouth every morning.    No current facility-administered medications on file prior to visit.      Chief Complaint  Patient presents with  . Follow-up    PT feels fine no cough, no complaints      Pulmonary tests PFT 08/12/07 >> FEV1 2.97(104%), FEV1% 84, TLC 4.89(90%), DLCO 84%, +BD  Past medical history HTN, HLD, Panic attacks, Depression, Psoriasis, DM, Peripheral neuropathy, GERD, HH  Past surgical history, Family history, Social history, Allergies reviewed  Vital Signs BP 110/70 (BP Location: Right Arm, Cuff Size: Large)   Pulse 90   Ht 5' 6.5" (1.689 m)   Wt 220 lb 3.2 oz (99.9 kg)   SpO2 97%   BMI 35.01 kg/m   History of Present Illness Dominique Evans is a 50 y.o. female former smoker with cough, asthma, UACS, and LPR.  She was seen by GI and had EGD.  Found to have bezoar and mild candidal esophagitis.  She has since changed her diet and her reflux is much better.  She is not having cough.  She denies sinus congestion, sore throat,  wheeze, sputum, or chest pain.  She is reluctant to change her medications since she is doing so well now, and had so much trouble before.  She got her flu shot last week at [facility].  Physical Exam  General - No distress ENT - No sinus tenderness, no oral exudate, no LAN Cardiac - s1s2 regular, no murmur Chest - No wheeze/rales/dullness Back - No focal tenderness Abd - Soft, non-tender Ext - No edema Neuro - Normal strength Skin - No rashes Psych - normal mood, and behavior   Assessment/Plan  Chronic cough. - improved  Mild, persistent asthma. - continue advair and singulair  Upper airway cough syndrome. - continue singulair and allegra  Laryngopharyngeal  reflux. - f/u with GI   Patient Instructions  Follow up in 1 year    Coralyn HellingVineet Shun Pletz, MD [place] [facility] Pager:  [phone] 07/28/2016, 3:57 PM

## 2017-02-06 ENCOUNTER — Other Ambulatory Visit: Payer: Self-pay | Admitting: Gastroenterology

## 2017-02-06 DIAGNOSIS — R079 Chest pain, unspecified: Secondary | ICD-10-CM

## 2017-04-28 ENCOUNTER — Encounter (HOSPITAL_COMMUNITY): Payer: Self-pay

## 2017-06-01 ENCOUNTER — Telehealth: Payer: Self-pay | Admitting: Physical Therapy

## 2017-06-01 NOTE — Telephone Encounter (Signed)
Left message to call to schedule PT eval on 8/8 & 06/01/17. No return call

## 2017-06-24 ENCOUNTER — Ambulatory Visit: Payer: 59 | Attending: Physical Medicine and Rehabilitation | Admitting: Physical Therapy

## 2017-06-24 ENCOUNTER — Encounter: Payer: Self-pay | Admitting: Physical Therapy

## 2017-06-24 DIAGNOSIS — M25611 Stiffness of right shoulder, not elsewhere classified: Secondary | ICD-10-CM | POA: Diagnosis present

## 2017-06-24 DIAGNOSIS — R252 Cramp and spasm: Secondary | ICD-10-CM | POA: Insufficient documentation

## 2017-06-24 DIAGNOSIS — M25511 Pain in right shoulder: Secondary | ICD-10-CM | POA: Insufficient documentation

## 2017-06-24 NOTE — Therapy (Signed)
[facility] [address] [place], [place], [address] Phone: [phone]   Fax:  [phone]  Physical Therapy Evaluation  Patient Details  Name: Dominique Evans MRN: [id number] Date of Birth: 1966/09/26 Referring Provider: Manon Hilding  Encounter Date: 06/24/2017      PT End of Session - 06/24/17 1117    Visit Number 1   Date for PT Re-Evaluation 08/24/17   PT Start Time 1050   PT Stop Time 1145   PT Time Calculation (min) 55 min   Activity Tolerance Patient tolerated treatment well   Behavior During Therapy [facility] for tasks assessed/performed      Past Medical History:  Diagnosis Date  . Allergic rhinitis   . Anemia   . Arthritis    spondylolisthesis- lumbar region  . Asthma    PFT 08/12/07: FEV1 2.97 (104%), FEV1% 84, TLC 4.89 (90%), DLCO 84%, +BD  . Blood transfusion    hx of 2002   . Blood transfusion without reported diagnosis    kidney, no issues  . Chronic constipation   . 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 Cough 03/23/13  . Depression   . Diabetes mellitus    pre gastric bypass  . Esophageal dysmotility 03/01/2015  . GERD (gastroesophageal reflux disease)   . H/O hiatal hernia   . 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 History of stress test    exercise stress test, ? where it was done,  about 6 yrs. ago,  results- negative   . Hydatidiform mole   . Hyperlipidemia   . Hypertension   . 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 Obesity   . Panic attacks   . Peripheral neuropathy    bilateral feet  . PONV (postoperative nausea and vomiting)   . Psoriasis   . Seasonal allergies   . Status post dilation of esophageal narrowing   . Unspecified essential hypertension   . Visual disturbance     Past Surgical History:  Procedure Laterality Date  . BOTOX INJECTION N/A 06/07/2015   Procedure: BOTOX INJECTION;  Surgeon: Louis Meckel,  MD;  [facility]: [facility];  Service: Endoscopy;  Laterality: N/A;  . COLONOSCOPY WITH PROPOFOL N/A 06/07/2015   Procedure: COLONOSCOPY WITH PROPOFOL;  Surgeon: Louis Meckel, MD;  [facility];  Service: Endoscopy;  Laterality: N/A;  . ELBOW FRACTURE SURGERY     left  . ESOPHAGEAL MANOMETRY N/A 04/08/2015   Procedure: ESOPHAGEAL MANOMETRY (EM);  Surgeon: Louis Meckel, MD;  [facility];  Service: Endoscopy;  Laterality: N/A;  . ESOPHAGOGASTRODUODENOSCOPY (EGD) WITH PROPOFOL N/A 06/07/2015   Procedure: ESOPHAGOGASTRODUODENOSCOPY (EGD) WITH PROPOFOL;  Surgeon: Louis Meckel, MD;  [facility];  Service: Endoscopy;  Laterality: N/A;  . GASTRIC ROUX-EN-Y  10/05/2011   Procedure: LAPAROSCOPIC ROUX-EN-Y GASTRIC;  Surgeon: Mariella Saa, MD;  [facility]: [facility];  Service: General;  Laterality: N/A;  UPPER ENDOSCOPY  . lap band removal     . LAPAROSCOPIC GASTRIC BANDING  02/2006   w/ truncal vagotomy  . LUMBAR LAMINECTOMY/DECOMPRESSION MICRODISCECTOMY Left 11/30/2012   Procedure: MICRO LUMBAR DECOMPRESSION L4-5 ON THE LEFT;  Surgeon: Javier Docker, MD;  [facility];  Service: Orthopedics;  Laterality: Left;  MICRO LUMBAR DECOMPRESSION L4-5 ON THE LEFT  . LUMBAR LAMINECTOMY/DECOMPRESSION MICRODISCECTOMY N/A 03/30/2013   Procedure: REDO MICRO LUMBAR DECOMPRESSION L4-5;  Surgeon: Javier Docker, MD;  [facility];  Service: Orthopedics;  Laterality: N/A;  . MAXIMUM ACCESS (MAS)POSTERIOR LUMBAR INTERBODY FUSION (PLIF) 1 LEVEL N/A 05/23/2014   Procedure: FOR MAXIMUM ACCESS (MAS) POSTERIOR LUMBAR INTERBODY FUSION (PLIF) Lumbar Four-Five;  Surgeon: Tia Alert, MD;  [facility];  Service: Neurosurgery;  Laterality: N/A;  FOR MAXIMUM ACCESS (MAS) POSTERIOR LUMBAR INTERBODY FUSION (PLIF) Lumbar Four-Five  . NEPHRECTOMY  2002   left  . PATENT DUCTUS ARTERIOUS REPAIR    . SHOULDER ARTHROSCOPY     right shoulder    There were no vitals filed for this  visit.       Subjective Assessment - 06/24/17 1048    Subjective Patient reports that she has had LBP for about 5 years, she had a laminectomy in 2014 and then a fusion in 2015.  She reports that she has been in constant pain.  Recent MRI was negative, except for DDD and past lumbar fusion.  MD order today is for right shoulder bursitis.  She reports pain a few months ago.  She is unsure of a specific cause, no imaging has been done.  She is right handed and uses a cane in her right hand   Limitations Lifting;House hold activities   Patient Stated Goals have less pain and better use   Currently in Pain? Yes   Pain Score 1    Pain Location Shoulder   Pain Orientation Right;Posterior   Pain Descriptors / Indicators Aching   Pain Type Acute pain   Pain Radiating Towards denies N/T   Pain Onset More than a month ago   Pain Frequency Intermittent   Aggravating Factors  pain up to 7/10 reaching up, lying on the right side, lifting   Pain Relieving Factors rest, not using pain can be 0/10   Effect of Pain on Daily Activities limits a lot of things            [facility] PT Assessment - 06/24/17 0001      Assessment   Medical Diagnosis right shoulder pain   Referring Provider B[place]a   Onset Date/Surgical Date 05/24/17   Hand Dominance Right   Prior Therapy no     Precautions   Precautions None     Balance Screen   Has the patient fallen in the past 6 months Yes   How many times? 3  left leg gives out   Has the patient had a decrease in activity level because of a fear of falling?  No   Is the patient reluctant to leave their home because of a fear of falling?  No     Home Environment   Additional Comments does her own housework, 2nd floor apartment     Prior Function   Level of Independence Independent with community mobility with device   Vocation Full time employment   Vocation Requirements computer, sitting, standing, has a stand up desk   Leisure walk some      Posture/Postural Control   Posture Comments fwd head, rounded shoulders     ROM / Strength   AROM / PROM / Strength AROM;Strength     AROM   Overall AROM Comments all motions increase pain   AROM Assessment Site Shoulder   Right/Left Shoulder Right   Right Shoulder Flexion 120 Degrees   Right Shoulder ABduction 115 Degrees   Right Shoulder Internal Rotation 55 Degrees   Right Shoulder External Rotation 80 Degrees     Strength  Overall Strength Comments 4/5 with pain in the anterior and posterior shoulder     Palpation   Palpation comment Patient very tight in the right upper trap and rhomboids, some tenderness, in the RC mms of the right shoulder     Special Tests    Special Tests --  + impingement, strong empty can test            Objective measurements completed on examination: See above findings.          [facility] Adult PT Treatment/Exercise - 06/24/17 0001      Modalities   Modalities Electrical Stimulation;Moist Heat     Moist Heat Therapy   Number Minutes Moist Heat 15 Minutes   Moist Heat Location Shoulder     Electrical Stimulation   Electrical Stimulation Location right shoulder/upper trap   Electrical Stimulation Action IFC   Electrical Stimulation Parameters sitting   Electrical Stimulation Goals Pain                PT Education - 06/24/17 1117    Education provided Yes   Education Details shrugs, scapular retraction, red tband scapular stabilization   Person(s) Educated Patient   Methods Explanation;Demonstration;Handout   Comprehension Verbalized understanding             PT Long Term Goals - 06/24/17 1120      PT LONG TERM GOAL #1   Title independent with initial HEP   Time 2   Period Weeks   Status New     PT LONG TERM GOAL #2   Title understand posture and body mechanics   Time 8   Period Weeks   Status New     PT LONG TERM GOAL #3   Title increase AROM of the right shoulder flexion to 140 degrees   Time 8    Period Weeks     PT LONG TERM GOAL #4   Title report pain decreased 50%   Time 8   Period Weeks   Status New     PT LONG TERM GOAL #5   Title report no difficulty dressing or doing hair   Time 8   Period Weeks   Status New                Plan - 06/24/17 1118    Clinical Impression Statement Patient reports that she has had right shoulder pain over the past few months, she is unsure of a cause.  She has poor posture and works at a computer all day, she does have the ability to stand at work.  Her ROM was limited, she has spasms in the upper trap and rhomboid area.  + impingement   History and Personal Factors relevant to plan of care: has had 3 lumbar surgeries   Clinical Presentation Stable   Clinical Decision Making Low   Rehab Potential Good   PT Frequency 2x / week   PT Duration 8 weeks   PT Treatment/Interventions ADLs/Self Care Home Management;Cryotherapy;Electrical Stimulation;Iontophoresis /ml Dexamethasone;Moist Heat;Therapeutic activities;Therapeutic exercise;Patient/family education;Manual techniques   PT Next Visit Plan slowly advance scapular and RC stabilization, educate on posture and body mechanics   Consulted and Agree with Plan of Care Patient      Patient will benefit from skilled therapeutic intervention in order to improve the following deficits and impairments:  Decreased strength, Postural dysfunction, Improper body mechanics, Pain, Increased muscle spasms, Decreased range of motion  Visit Diagnosis: Acute pain of right shoulder - Plan: PT plan  of care cert/re-cert  Stiffness of right shoulder, not elsewhere classified - Plan: PT plan of care cert/re-cert  Cramp and spasm - Plan: PT plan of care cert/re-cert     Problem List Patient Active Problem List   Diagnosis Date Noted  . Esophageal dysmotility 03/01/2015  . Colon cancer screening 03/01/2015  . Lateral ventral hernia 03/01/2015  . S/P lumbar spinal fusion 05/23/2014  . Edema  06/28/2013  . 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  . HNP (herniated nucleus pulposus), lumbar 11/30/2012  . THORACIC AORTIC ANEURYSM 10/23/2010  . CONSTIPATION, CHRONIC 03/23/2010  . OBESITY, UNSPECIFIED 10/31/2007  . OBSTRUCTIVE SLEEP APNEA 10/31/2007  . CHRONIC RHINITIS 10/31/2007  . Cough variant asthma 10/31/2007  . GERD 10/31/2007  . DIABETES MELLITUS 10/04/2007  . HYPERTENSION 10/04/2007  . 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 NEPHRECTOMY, HX OF 10/04/2007    Jearld LeschALBRIGHT,MICHAEL W., PT 06/24/2017, 11:34 AM  [facility] [address] [place], [place], [address] Phone: [phone]   Fax:  [phone]  Name: Dominique MaidensSusan Evans MRN: [id number] Date of Birth: 12/24/1965

## 2017-07-01 ENCOUNTER — Encounter: Payer: Self-pay | Admitting: Physical Therapy

## 2017-07-01 ENCOUNTER — Ambulatory Visit: Payer: 59 | Admitting: Physical Therapy

## 2017-07-01 DIAGNOSIS — R252 Cramp and spasm: Secondary | ICD-10-CM

## 2017-07-01 DIAGNOSIS — M25511 Pain in right shoulder: Secondary | ICD-10-CM | POA: Diagnosis not present

## 2017-07-01 DIAGNOSIS — M25611 Stiffness of right shoulder, not elsewhere classified: Secondary | ICD-10-CM

## 2017-07-01 NOTE — Therapy (Signed)
[facility] [place] [facility] [address] [place], [place], [address] Phone: [phone]   Fax:  [phone]  Physical Therapy Treatment  Patient Details  Name: Dominique Evans MRN: [id number] Date of Birth: [birth year] Referring Provider: Manon Hilding  Encounter Date: 07/01/2017      PT End of Session - 07/01/17 0921    Visit Number 2   PT Start Time 0845   PT Stop Time 0940   PT Time Calculation (min) 55 min   Activity Tolerance Patient tolerated treatment well   Behavior During Therapy [facility] for tasks assessed/performed      Past Medical History:  Diagnosis Date  . Allergic rhinitis   . Anemia   . Arthritis    spondylolisthesis- lumbar region  . Asthma    PFT 08/12/07: FEV1 2.97 (104%), FEV1% 84, TLC 4.89 (90%), DLCO 84%, +BD  . Blood transfusion    hx of 2002   . Blood transfusion without reported diagnosis    kidney, no issues  . Chronic constipation   . 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 Cough 03/23/13  . Depression   . Diabetes mellitus    pre gastric bypass  . Esophageal dysmotility 03/01/2015  . GERD (gastroesophageal reflux disease)   . H/O hiatal hernia   . 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 History of stress test    exercise stress test, ? where it was done,  about 6 yrs. ago,  results- negative   . Hydatidiform mole   . Hyperlipidemia   . Hypertension   . 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 Obesity   . Panic attacks   . Peripheral neuropathy    bilateral feet  . PONV (postoperative nausea and vomiting)   . Psoriasis   . Seasonal allergies   . Status post dilation of esophageal narrowing   . Unspecified essential hypertension   . Visual disturbance     Past Surgical History:  Procedure Laterality Date  . BOTOX INJECTION N/A 06/07/2015   Procedure: BOTOX INJECTION;  Surgeon: Louis Meckel, MD;  [facility];  Service:  Endoscopy;  Laterality: N/A;  . COLONOSCOPY WITH PROPOFOL N/A 06/07/2015   Procedure: COLONOSCOPY WITH PROPOFOL;  Surgeon: Louis Meckel, MD;  [facility];  Service: Endoscopy;  Laterality: N/A;  . ELBOW FRACTURE SURGERY     left  . ESOPHAGEAL MANOMETRY N/A 04/08/2015   Procedure: ESOPHAGEAL MANOMETRY (EM);  Surgeon: Louis Meckel, MD;  [facility];  Service: Endoscopy;  Laterality: N/A;  . ESOPHAGOGASTRODUODENOSCOPY (EGD) WITH PROPOFOL N/A 06/07/2015   Procedure: ESOPHAGOGASTRODUODENOSCOPY (EGD) WITH PROPOFOL;  Surgeon: Louis Meckel, MD;  [facility];  Service: Endoscopy;  Laterality: N/A;  . GASTRIC ROUX-EN-Y  10/05/2011   Procedure: LAPAROSCOPIC ROUX-EN-Y GASTRIC;  Surgeon: Mariella Saa, MD;  [facility];  Service: General;  Laterality: N/A;  UPPER ENDOSCOPY  . lap band removal     . LAPAROSCOPIC GASTRIC BANDING  02/2006   w/ truncal vagotomy  . LUMBAR LAMINECTOMY/DECOMPRESSION MICRODISCECTOMY Left 11/30/2012   Procedure: MICRO LUMBAR DECOMPRESSION L4-5 ON THE LEFT;  Surgeon: Javier Docker, MD;  [facility];  Service: Orthopedics;  Laterality: Left;  MICRO LUMBAR DECOMPRESSION L4-5 ON THE LEFT  . LUMBAR LAMINECTOMY/DECOMPRESSION MICRODISCECTOMY N/A 03/30/2013   Procedure: REDO MICRO LUMBAR DECOMPRESSION L4-5;  Surgeon: Javier Docker, MD;  [facility];  Service: Orthopedics;  Laterality: N/A;  . MAXIMUM ACCESS (MAS)POSTERIOR LUMBAR INTERBODY FUSION (PLIF) 1 LEVEL N/A 05/23/2014   Procedure: FOR MAXIMUM ACCESS (MAS) POSTERIOR LUMBAR INTERBODY FUSION (PLIF) Lumbar Four-Five;  Surgeon: Tia Alertavid S Jones, MD;  [facility];  Service: Neurosurgery;  Laterality: N/A;  FOR MAXIMUM ACCESS (MAS) POSTERIOR LUMBAR INTERBODY FUSION (PLIF) Lumbar Four-Five  . NEPHRECTOMY  2002   left  . PATENT DUCTUS ARTERIOUS REPAIR    . SHOULDER ARTHROSCOPY     right shoulder    There were no vitals filed for this visit.      Subjective Assessment  - 07/01/17 0846    Subjective Pt reports no pain at this time, but it does feel like it has moved down her arm,   Currently in Pain? No/denies   Pain Score 0-No pain                         [facility] Adult PT Treatment/Exercise - 07/01/17 0001      Exercises   Exercises Shoulder     Shoulder Exercises: Standing   External Rotation 15 reps;Theraband  x2   Theraband Level (Shoulder External Rotation) Level 1 (Yellow)   Internal Rotation 15 reps;Theraband  x2   Theraband Level (Shoulder Internal Rotation) Level 2 (Red)   Flexion 10 reps;Weights  x2   Shoulder Flexion Weight (lbs) 1   ABduction 10 reps;Theraband  x2   Shoulder ABduction Weight (lbs) 1   Extension 15 reps;Theraband  x2   Theraband Level (Shoulder Extension) Level 2 (Red)   Row 15 reps;Theraband  x2   Theraband Level (Shoulder Row) Level 2 (Red)     Shoulder Exercises: ROM/Strengthening   UBE (Upper Arm Bike) L1 143frd/3rev      Modalities   Modalities Electrical Stimulation;Moist Heat     Moist Heat Therapy   Number Minutes Moist Heat 15 Minutes   Moist Heat Location Shoulder     Electrical Stimulation   Electrical Stimulation Location right shoulder/upper trap   Electrical Stimulation Action IFC    Electrical Stimulation Parameters sitting   Electrical Stimulation Goals Pain     Manual Therapy   Manual Therapy Passive ROM   Passive ROM R shoulder all directions                      PT Long Term Goals - 06/24/17 1120      PT LONG TERM GOAL #1   Title independent with initial HEP   Time 2   Period Weeks   Status New     PT LONG TERM GOAL #2   Title understand posture and body mechanics   Time 8   Period Weeks   Status New     PT LONG TERM GOAL #3   Title increase AROM of the right shoulder flexion to 140 degrees   Time 8   Period Weeks     PT LONG TERM GOAL #4   Title report pain decreased 50%   Time 8   Period Weeks   Status New     PT LONG TERM GOAL #5    Title report no difficulty dressing or doing hair   Time 8   Period Weeks   Status New               Plan - 07/01/17 [id number]    Clinical Impression Statement Pt tolerated an initial progression to exercises well.  Pt does repots tolerable pain with standing rows and extensions. Good PROM noted with MT   Rehab Potential Good   PT Frequency 2x / week   PT Duration 8 weeks   PT Treatment/Interventions ADLs/Self Care Home Management;Cryotherapy;Electrical Stimulation;Iontophoresis /ml Dexamethasone;Moist Heat;Therapeutic activities;Therapeutic exercise;Patient/family education;Manual techniques   PT Next Visit Plan slowly advance scapular and RC stabilization, educate on posture and body mechanics      Patient will benefit from skilled therapeutic intervention in order to improve the following deficits and impairments:  Decreased strength, Postural dysfunction, Improper body mechanics, Pain, Increased muscle spasms, Decreased range of motion  Visit Diagnosis: Acute pain of right shoulder  Stiffness of right shoulder, not elsewhere classified  Cramp and spasm     Problem List Patient Active Problem List   Diagnosis Date Noted  . Esophageal dysmotility 03/01/2015  . Colon cancer screening 03/01/2015  . Lateral ventral hernia 03/01/2015  . S/P lumbar spinal fusion 05/23/2014  . Edema 06/28/2013  . 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  . HNP (herniated nucleus pulposus), lumbar 11/30/2012  . THORACIC AORTIC ANEURYSM 10/23/2010  . CONSTIPATION, CHRONIC 03/23/2010  . OBESITY, UNSPECIFIED 10/31/2007  . OBSTRUCTIVE SLEEP APNEA 10/31/2007  . CHRONIC RHINITIS 10/31/2007  . Cough variant asthma 10/31/2007  . GERD 10/31/2007  . DIABETES MELLITUS 10/04/2007  . HYPERTENSION 10/04/2007  . 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 NEPHRECTOMY, HX OF 10/04/2007    Grayce Sessions, PTA 07/01/2017, 9:26 AM  [facility] [place] [facility] [address] [place], [place], [address] Phone: [phone]   Fax:  [phone]  Name: Dominique Evans MRN: [id number] Date of Birth: 06/12/1966

## 2017-07-12 ENCOUNTER — Ambulatory Visit: Payer: 59 | Admitting: Physical Therapy

## 2017-07-14 ENCOUNTER — Other Ambulatory Visit: Payer: Self-pay | Admitting: Gastroenterology

## 2017-07-14 DIAGNOSIS — R079 Chest pain, unspecified: Secondary | ICD-10-CM

## 2017-07-15 ENCOUNTER — Ambulatory Visit: Payer: 59 | Admitting: Physical Therapy

## 2017-07-26 ENCOUNTER — Other Ambulatory Visit: Payer: Self-pay | Admitting: General Surgery

## 2017-07-26 DIAGNOSIS — K439 Ventral hernia without obstruction or gangrene: Secondary | ICD-10-CM

## 2017-07-29 ENCOUNTER — Encounter: Payer: Self-pay | Admitting: Pulmonary Disease

## 2017-07-29 ENCOUNTER — Ambulatory Visit (INDEPENDENT_AMBULATORY_CARE_PROVIDER_SITE_OTHER): Payer: 59 | Admitting: Pulmonary Disease

## 2017-07-29 ENCOUNTER — Ambulatory Visit: Payer: 59 | Attending: Physical Medicine and Rehabilitation | Admitting: Physical Therapy

## 2017-07-29 ENCOUNTER — Encounter: Payer: Self-pay | Admitting: Physical Therapy

## 2017-07-29 VITALS — BP 126/72 | HR 79 | Ht 66.5 in | Wt 205.4 lb

## 2017-07-29 DIAGNOSIS — M25611 Stiffness of right shoulder, not elsewhere classified: Secondary | ICD-10-CM | POA: Diagnosis present

## 2017-07-29 DIAGNOSIS — R252 Cramp and spasm: Secondary | ICD-10-CM

## 2017-07-29 DIAGNOSIS — K219 Gastro-esophageal reflux disease without esophagitis: Secondary | ICD-10-CM | POA: Diagnosis not present

## 2017-07-29 DIAGNOSIS — J4530 Mild persistent asthma, uncomplicated: Secondary | ICD-10-CM

## 2017-07-29 DIAGNOSIS — M25511 Pain in right shoulder: Secondary | ICD-10-CM | POA: Insufficient documentation

## 2017-07-29 DIAGNOSIS — R058 Other specified cough: Secondary | ICD-10-CM

## 2017-07-29 DIAGNOSIS — R053 Chronic cough: Secondary | ICD-10-CM

## 2017-07-29 DIAGNOSIS — R05 Cough: Secondary | ICD-10-CM | POA: Diagnosis not present

## 2017-07-29 NOTE — Progress Notes (Signed)
Current Outpatient Prescriptions on File Prior to Visit  Medication Sig  . adalimumab (HUMIRA PEN) 40 MG/0.8ML injection Inject 40 mg into the skin every 14 (fourteen) days.   Marland Kitchen albuterol (ACCUNEB) 1.25 MG/3ML nebulizer solution Take 3 mLs by nebulization every 4 (four) hours as needed for wheezing or shortness of breath.   Marland Kitchen albuterol (PROVENTIL HFA;VENTOLIN HFA) 108 (90 BASE) MCG/ACT inhaler Inhale 1-2 puffs into the lungs every 4 (four) hours as needed for wheezing or shortness of breath.  Marland Kitchen amitriptyline (ELAVIL) 25 MG tablet Take 50 mg by mouth at bedtime.   Marland Kitchen Apoaequorin (PREVAGEN) 10 MG CAPS Take 10 mg by mouth daily.  Marland Kitchen BAYER CONTOUR TEST test strip PT CHECKS SUGARS TWICE DAILY DX E11.29  . CALCIUM-VITAMIN D PO Take 1 tablet by mouth daily.   Marland Kitchen DEXILANT 60 MG capsule TAKE 1 CAPSULE (60 MG TOTAL) BY MOUTH 2 (TWO) TIMES DAILY.  . ferrous sulfate 325 (65 FE) MG tablet Take 325 mg by mouth daily with breakfast.  . fexofenadine (ALLEGRA) 180 MG tablet Take 180 mg by mouth daily.  . fluconazole (DIFLUCAN) 100 MG tablet Take 1 tablet (100 mg total) by mouth daily.  . Fluticasone-Salmeterol (ADVAIR) 250-50 MCG/DOSE AEPB Inhale 1 puff into the lungs 2 (two) times daily.  . furosemide (LASIX) 20 MG tablet Take 20 mg by mouth every morning.   Marland Kitchen losartan (COZAAR) 50 MG tablet Take 50 mg by mouth every morning.   . methocarbamol (ROBAXIN) 750 MG tablet Take 750 mg by mouth 3 (three) times daily.  . montelukast (SINGULAIR) 10 MG tablet Take 10 mg by mouth daily with breakfast.   . Multiple Vitamins-Minerals (MULTIVITAMIN PO) Take 1 tablet by mouth daily.   . norethindrone-ethinyl estradiol (JUNEL FE,GILDESS FE,LOESTRIN FE) 1-20 MG-MCG tablet Take 1 tablet by mouth at bedtime.   Marland Kitchen oxyCODONE-acetaminophen (PERCOCET/ROXICET) 5-325 MG tablet Take 1 tablet by mouth 6 (six) times daily.   . polyethylene glycol (MIRALAX) powder Take 17 g by mouth daily.   . potassium citrate (UROCIT-K) 10 MEQ (1080 MG) SR  tablet Take 10 mEq by mouth 2 (two) times daily.   . pregabalin (LYRICA) 150 MG capsule Take 150 mg by mouth 2 (two) times daily.  Marland Kitchen PRISTIQ 50 MG 24 hr tablet Take 50 mg by mouth daily.  . rosuvastatin (CRESTOR) 10 MG tablet Take 5 mg by mouth every morning.   . verapamil (COVERA HS) 240 MG (CO) 24 hr tablet Take 240 mg by mouth every morning.    No current facility-administered medications on file prior to visit.      Chief Complaint  Patient presents with  . Follow-up    Pt states that she has been doing good. Denies any cough, SOB, or CP. Pt states that it has been a great year.     Pulmonary tests PFT 08/12/07 >> FEV1 2.97(104%), FEV1% 84, TLC 4.89(90%), DLCO 84%, +BD  Past medical history HTN, HLD, Panic attacks, Depression, Psoriasis, DM, Peripheral neuropathy, GERD, HH  Past surgical history, Family history, Social history, Allergies reviewed  Vital Signs BP 126/72 (BP Location: Right Arm, Cuff Size: Normal) Comment (BP Location): forearm  Pulse 79   Ht 5' 6.5" (1.689 m)   Wt 205 lb 6.4 oz (93.2 kg)   SpO2 99%   BMI 32.66 kg/m   History of Present Illness Dominique Evans is a 51 y.o. female former smoker with cough, asthma, UACS, and LPR.  She has been doing well.  She is not having  sinus congestion, cough, wheeze, sputum, sore throat, fever, or chest pain.  Reflux is controlled.  She is followed by Dr. Sharyn Lull with dermatology for psoriasis and is on humira.  She hasn't needed to use albuterol much.  She already got her flu shot this season.  Physical Exam  General - pleasant Eyes - pupils reactive ENT - no sinus tenderness, no oral exudate, no LAN Cardiac - regular, no murmur Chest - no wheeze, rales Abd - soft, non tender Ext - no edema Skin - faint changes of psoriasis Neuro - normal strength Psych - normal mood  Discussion She has chronic cough related to allergic asthma, post nasal drip, and reflux.  She has been doing well on current regimen.   Discussed the option of tapering down her regimen.  She used to get frequent exacerbations prior to being established on current regimen, and prefers to remain on current regimen.  Assessment/Plan  Mild, persistent asthma. - continue advair, singulair, and prn albuterol  Upper airway cough syndrome. - continue allegra, singulair  Laryngopharyngeal reflux. - she is on dexilant and followed by GI   Patient Instructions  Follow up in 1 year    Coralyn Helling, MD [facility]/Critical [facility]/Sleep Pager:  [phone] 07/29/2017, 9:19 AM

## 2017-07-29 NOTE — Therapy (Signed)
[place], [place], [address] Phone: [phone]   Fax:  [phone]  Physical Therapy Treatment  Patient Details  Name: Dominique Evans MRN: [id number] Date of Birth: [birth year] Referring Provider: Greta Doom  Encounter Date: 07/29/2017      PT End of Session - 07/29/17 1332    Visit Number 3   Date for PT Re-Evaluation 08/24/17   PT Start Time 1300   PT Stop Time 1332   PT Time Calculation (min) 32 min   Activity Tolerance Patient tolerated treatment well   Behavior During Therapy [facility] for tasks assessed/performed      Past Medical History:  Diagnosis Date  . Allergic rhinitis   . Anemia   . Arthritis    spondylolisthesis- lumbar region  . Asthma    PFT 08/12/07: FEV1 2.97 (104%), FEV1% 84, TLC 4.89 (90%), DLCO 84%, +BD  . Blood transfusion    hx of 2002   . Blood transfusion without reported diagnosis    kidney, no issues  . Chronic constipation   . 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 Cough 03/23/13  . Depression   . Diabetes mellitus    pre gastric bypass  . Esophageal dysmotility 03/01/2015  . GERD (gastroesophageal reflux disease)   . H/O hiatal hernia   . 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 History of stress test    exercise stress test, ? where it was done,  about 6 yrs. ago,  results- negative   . Hydatidiform mole   . Hyperlipidemia   . Hypertension   . 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 Obesity   . Panic attacks   . Peripheral neuropathy    bilateral feet  . PONV (postoperative nausea and vomiting)   . Psoriasis   . Seasonal allergies   . Status post dilation of esophageal narrowing   . Unspecified essential hypertension   . Visual disturbance     Past Surgical History:  Procedure Laterality Date  . BOTOX INJECTION N/A 06/07/2015   Procedure: BOTOX INJECTION;  Surgeon: Inda Castle,  MD;  Location: [facility];  Service: Endoscopy;  Laterality: N/A;  . COLONOSCOPY WITH PROPOFOL N/A 06/07/2015   Procedure: COLONOSCOPY WITH PROPOFOL;  Surgeon: Inda Castle, MD;  Location: [facility];  Service: Endoscopy;  Laterality: N/A;  . ELBOW FRACTURE SURGERY     left  . ESOPHAGEAL MANOMETRY N/A 04/08/2015   Procedure: ESOPHAGEAL MANOMETRY (EM);  Surgeon: Inda Castle, MD;  Location: [facility];  Service: Endoscopy;  Laterality: N/A;  . ESOPHAGOGASTRODUODENOSCOPY (EGD) WITH PROPOFOL N/A 06/07/2015   Procedure: ESOPHAGOGASTRODUODENOSCOPY (EGD) WITH PROPOFOL;  Surgeon: Inda Castle, MD;  Location: [facility];  Service: Endoscopy;  Laterality: N/A;  . GASTRIC ROUX-EN-Y  10/05/2011   Procedure: LAPAROSCOPIC ROUX-EN-Y GASTRIC;  Surgeon: Edward Jolly, MD;  Location: [facility];  Service: General;  Laterality: N/A;  UPPER ENDOSCOPY  . lap band removal     . LAPAROSCOPIC GASTRIC BANDING  02/2006   w/ truncal vagotomy  . LUMBAR LAMINECTOMY/DECOMPRESSION MICRODISCECTOMY Left 11/30/2012   Procedure: MICRO LUMBAR DECOMPRESSION L4-5 ON THE LEFT;  Surgeon: Johnn Hai, MD;  Location: [facility];  Service: Orthopedics;  Laterality: Left;  MICRO LUMBAR DECOMPRESSION L4-5 ON THE LEFT  . LUMBAR LAMINECTOMY/DECOMPRESSION MICRODISCECTOMY N/A 03/30/2013   Procedure: REDO MICRO LUMBAR DECOMPRESSION L4-5;  Surgeon: Johnn Hai, MD;  Location: [facility];  Service: Orthopedics;  Laterality: N/A;  . MAXIMUM ACCESS (MAS)POSTERIOR LUMBAR INTERBODY FUSION (PLIF) 1 LEVEL N/A 05/23/2014   Procedure: FOR MAXIMUM ACCESS (MAS) POSTERIOR LUMBAR INTERBODY FUSION (PLIF) Lumbar Four-Five;  Surgeon: Eustace Moore, MD;  Location: [facility];  Service: Neurosurgery;  Laterality: N/A;  FOR MAXIMUM ACCESS (MAS) POSTERIOR LUMBAR INTERBODY FUSION (PLIF) Lumbar Four-Five  . NEPHRECTOMY  2002   left  . PATENT DUCTUS ARTERIOUS REPAIR    . SHOULDER ARTHROSCOPY     right shoulder    There were no vitals filed for this  visit.      Subjective Assessment - 07/29/17 1302    Subjective "Going good,  my arm does not hurt anymore"  Pt reports compliance with HEP   Currently in Pain? No/denies   Pain Score 0-No pain            [facility] PT Assessment - 07/29/17 0001      AROM   Overall AROM  Within functional limits for tasks performed   AROM Assessment Site Shoulder   Right/Left Shoulder Right     Strength   Overall Strength Comments R shoulder 5/5                     [facility] Adult PT Treatment/Exercise - 07/29/17 0001      Shoulder Exercises: Seated   Other Seated Exercises Rows and lats 20lb 2x10    Other Seated Exercises bent over ext 1lb 2x10     Shoulder Exercises: Standing   External Rotation Theraband;20 reps   Theraband Level (Shoulder External Rotation) Level 1 (Yellow)   Flexion Weights;20 reps;Both   Shoulder Flexion Weight (lbs) 2   ABduction Theraband;20 reps;Both   Shoulder ABduction Weight (lbs) 1     Shoulder Exercises: ROM/Strengthening   UBE (Upper Arm Bike) L4 32fd/2rev    Other ROM/Strengthening Exercises NuStep L4 x4 min                      PT Long Term Goals - 07/29/17 1312      PT LONG TERM GOAL #1   Title independent with initial HEP   Status Achieved     PT LONG TERM GOAL #2   Title understand posture and body mechanics   Status Achieved     PT LONG TERM GOAL #3   Title increase AROM of the right shoulder flexion to 140 degrees   Status Achieved     PT LONG TERM GOAL #4   Title report pain decreased 50%   Status Achieved     PT LONG TERM GOAL #5   Title report no difficulty dressing or doing hair   Status Achieved               Plan - 07/29/17 1332    Clinical Impression Statement All goals met, pt reports no functional limitations.    Rehab Potential Good   PT Frequency 2x / week   PT Duration 8 weeks   PT Treatment/Interventions ADLs/Self Care Home Management;Cryotherapy;Electrical Stimulation;Iontophoresis  481mml Dexamethasone;Moist Heat;Therapeutic activities;Therapeutic exercise;Patient/family education;Manual techniques   PT Next Visit Plan D/C PT      Patient will benefit from skilled therapeutic intervention in order to improve the following deficits and impairments:  Decreased strength, Postural dysfunction, Improper body mechanics, Pain, Increased muscle spasms, Decreased range of motion  Visit Diagnosis: Acute pain of right shoulder  Stiffness of right  shoulder, not elsewhere classified  Cramp and spasm     Problem List Patient Active Problem List   Diagnosis Date Noted  . Esophageal dysmotility 03/01/2015  . Colon cancer screening 03/01/2015  . Lateral ventral hernia 03/01/2015  . S/P lumbar spinal fusion 05/23/2014  . Edema 06/28/2013  . 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  . HNP (herniated nucleus pulposus), lumbar 11/30/2012  . THORACIC AORTIC ANEURYSM 10/23/2010  . CONSTIPATION, CHRONIC 03/23/2010  . OBESITY, UNSPECIFIED 10/31/2007  . OBSTRUCTIVE SLEEP APNEA 10/31/2007  . CHRONIC RHINITIS 10/31/2007  . Cough variant asthma 10/31/2007  . GERD 10/31/2007  . DIABETES MELLITUS 10/04/2007  . HYPERTENSION 10/04/2007  . 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 NEPHRECTOMY, HX OF 10/04/2007   PHYSICAL THERAPY DISCHARGE SUMMARY  Visits from Start of Care: 3 Plan: Patient agrees to discharge.  Patient goals were met. Patient is being discharged due to meeting the stated rehab goals.  ?????     Scot Jun 07/29/2017, 1:33 PM  [place], [place], [address] Phone: [phone]   Fax:  [phone]  Name: Dominique Evans MRN: [id number] Date of Birth: [birth year]

## 2017-07-29 NOTE — Patient Instructions (Signed)
Follow up in 1 year.

## 2017-08-12 ENCOUNTER — Ambulatory Visit
Admission: RE | Admit: 2017-08-12 | Discharge: 2017-08-12 | Disposition: A | Discharge status: Home / Self Care | Payer: 59 | Source: Ambulatory Visit | Attending: General Surgery | Admitting: General Surgery

## 2017-08-12 DIAGNOSIS — K439 Ventral hernia without obstruction or gangrene: Secondary | ICD-10-CM

## 2017-11-12 ENCOUNTER — Telehealth: Payer: Self-pay | Admitting: *Deleted

## 2017-11-12 DIAGNOSIS — R079 Chest pain, unspecified: Secondary | ICD-10-CM

## 2017-11-12 MED ORDER — DEXLANSOPRAZOLE 60 MG PO CPDR: 60.0000 mg | DELAYED_RELEASE_CAPSULE | Freq: Two times a day (BID) | ORAL | 3 refills | Status: DC

## 2017-11-12 NOTE — Telephone Encounter (Signed)
Dexilant sent per pharmacy request

## 2018-02-03 ENCOUNTER — Other Ambulatory Visit: Payer: Self-pay | Admitting: Physical Medicine and Rehabilitation

## 2018-02-03 DIAGNOSIS — M5416 Radiculopathy, lumbar region: Secondary | ICD-10-CM

## 2018-02-12 ENCOUNTER — Ambulatory Visit
Admission: RE | Admit: 2018-02-12 | Discharge: 2018-02-12 | Disposition: A | Discharge status: Home / Self Care | Payer: 59 | Source: Ambulatory Visit | Attending: Physical Medicine and Rehabilitation | Admitting: Physical Medicine and Rehabilitation

## 2018-02-12 DIAGNOSIS — M5416 Radiculopathy, lumbar region: Secondary | ICD-10-CM

## 2018-02-12 MED ORDER — GADOBENATE DIMEGLUMINE 529 MG/ML IV SOLN
17.0000 mL | Freq: Once | INTRAVENOUS | Status: AC | PRN
  Administered 2018-02-12: 17 mL via INTRAVENOUS

## 2018-02-22 ENCOUNTER — Other Ambulatory Visit: Payer: Self-pay | Admitting: Internal Medicine

## 2018-02-22 ENCOUNTER — Ambulatory Visit
Admission: RE | Admit: 2018-02-22 | Discharge: 2018-02-22 | Disposition: A | Discharge status: Home / Self Care | Payer: 59 | Source: Ambulatory Visit | Attending: Internal Medicine | Admitting: Internal Medicine

## 2018-02-22 DIAGNOSIS — S2232XA Fracture of one rib, left side, initial encounter for closed fracture: Secondary | ICD-10-CM

## 2018-02-22 MED ORDER — IOPAMIDOL (ISOVUE-370) INJECTION 76%
75.0000 mL | Freq: Once | INTRAVENOUS | Status: AC | PRN
  Administered 2018-02-22: 75 mL via INTRAVENOUS

## 2018-07-04 ENCOUNTER — Ambulatory Visit (INDEPENDENT_AMBULATORY_CARE_PROVIDER_SITE_OTHER): Payer: 59 | Admitting: Pulmonary Disease

## 2018-07-04 ENCOUNTER — Encounter: Payer: Self-pay | Admitting: Pulmonary Disease

## 2018-07-04 VITALS — BP 110/78 | HR 87 | Ht 65.5 in | Wt 181.6 lb

## 2018-07-04 DIAGNOSIS — R05 Cough: Secondary | ICD-10-CM | POA: Diagnosis not present

## 2018-07-04 DIAGNOSIS — K219 Gastro-esophageal reflux disease without esophagitis: Secondary | ICD-10-CM

## 2018-07-04 DIAGNOSIS — R058 Other specified cough: Secondary | ICD-10-CM

## 2018-07-04 DIAGNOSIS — J4530 Mild persistent asthma, uncomplicated: Secondary | ICD-10-CM

## 2018-07-04 NOTE — Patient Instructions (Signed)
Follow up in 1 year.

## 2018-07-04 NOTE — Progress Notes (Signed)
Pulmonary, Critical Care, and Sleep Medicine  Chief Complaint  Patient presents with  . Follow-up    Breathing and cough has improved.     Constitutional: BP 110/78 (BP Location: Left Arm, Cuff Size: Normal)   Pulse 87   Ht 5' 5.5" (1.664 m)   Wt 181 lb 9.6 oz (82.4 kg)   SpO2 98%   BMI 29.76 kg/m   History of Present Illness: Dominique Evans is a 52 y.o. female former smoker with cough, asthma, post nasal drip, and reflux.  Breathing has been okay.  Not having sinus congestion, sore throat, sputum, wheeze, or reflux.  Has surgery set up in [place] for her abdominal hernia.  Had to come off psoriasis medications for surgery.  Got a blood blister on her toe.  Comprehensive Respiratory Exam:  Appearance - well kempt  ENMT - nasal mucosa moist, turbinates clear, midline nasal septum, no dental lesions, no gingival bleeding, no oral exudates, no tonsillar hypertrophy Neck - no masses, trachea midline, no thyromegaly, no elevation in JVP Respiratory - normal appearance of chest wall, normal respiratory effort w/o accessory muscle use, no dullness on percussion, no wheezing or rales CV - s1s2 regular rate and rhythm, no murmurs, no peripheral edema, radial pulses symmetric GI - soft, non tender, abdominal hernia Lymph - no adenopathy noted in neck and axillary areas MSK - normal muscle strength and tone, normal gait Ext - no cyanosis, clubbing, or joint inflammation noted Skin - changes of psoriasis, peeling blister on plantar surface of 1st left toe Neuro - oriented to person, place, and time Psych - normal mood and affect   Assessment/Plan:  Mild, persistent asthma. - continue advair, singulair, albuterol - might be able to step down regimen at next visit  Upper airway cough syndrome. - continue allegra, singulair  Laryngopharyngeal reflux. - followed by GI  Psoriasis. - followed by Dr. Sharyn [place] with dermatology   Patient Instructions  Follow up in 1  year    Coralyn HellingVineet Mahek Schlesinger, MD [facility] 07/04/2018, 4:02 PM  Flow Sheet  Pulmonary tests: PFT 08/12/07>>FEV1 2.97(104%), FEV1% 84, TLC 4.89(90%), DLCO 84%, +BD  Past Medical History: She  has a past medical history of Allergic rhinitis, Anemia, Arthritis, Asthma, Blood transfusion, Blood transfusion without reported diagnosis, Chronic constipation, Congenital heart defect, Cough (03/23/13), Depression, Diabetes mellitus, Esophageal dysmotility (03/01/2015), GERD (gastroesophageal reflux disease), H/O hiatal hernia, Hernia, History of stress test, Hydatidiform mole, Hyperlipidemia, Hypertension, Nephrolithiasis, Obesity, Panic attacks, Peripheral neuropathy, PONV (postoperative nausea and vomiting), Psoriasis, Seasonal allergies, Status post dilation of esophageal narrowing, Unspecified essential hypertension, and Visual disturbance.  Past Surgical History: She  has a past surgical history that includes Nephrectomy (2002); Elbow fracture surgery; Shoulder arthroscopy; Laparoscopic gastric banding (02/2006); Patent ductus arterious repair; lap band removal ; Gastric Roux-En-Y (10/05/2011); Lumbar laminectomy/decompression microdiscectomy (Left, 11/30/2012); Lumbar laminectomy/decompression microdiscectomy (N/A, 03/30/2013); Maximum access (mas)posterior lumbar interbody fusion (plif) 1 level (N/A, 05/23/2014); Esophageal manometry (N/A, 04/08/2015); Esophagogastroduodenoscopy (egd) with propofol (N/A, 06/07/2015); Botox injection (N/A, 06/07/2015); and Colonoscopy with propofol (N/A, 06/07/2015).  Family History: Her family history includes Brain cancer in her mother; Breast cancer in her maternal aunt and mother; Diabetes in her mother; Heart attack in her father and sister; Heart disease in her father, mother, and sister; Hyperlipidemia in her father, mother, and sister; Hypertension in her father, mother, and sister; Lung cancer in her mother; Skin cancer in her father and mother.  Social  History: She  reports that she quit smoking about 35 years  ago. Her smoking use included cigarettes. She smoked 2.00 packs per day. She has never used smokeless tobacco. She reports that she does not drink alcohol or use drugs.  Medications: Allergies as of 07/04/2018      Reactions   Penicillins Anaphylaxis   Has patient had a PCN reaction causing immediate rash, facial/tongue/throat swelling, SOB or lightheadedness with hypotension: yes Has patient had a PCN reaction causing severe rash involving mucus membranes or skin necrosis: unknown Has patient had a PCN reaction that required hospitalization unknown Has patient had a PCN reaction occurring within the last 10 years: no If all of the above answers are "NO", then may proceed with Cephalosporin use.   Sulfonamide Derivatives Anaphylaxis   Clindamycin/lincomycin Nausea And Vomiting   Ciprofloxacin Hives      Medication List        Accurate as of 07/04/18  4:02 PM. Always use your most recent med list.          albuterol 108 (90 Base) MCG/ACT inhaler Commonly known as:  PROVENTIL HFA;VENTOLIN HFA Inhale 1-2 puffs into the lungs every 4 (four) hours as needed for wheezing or shortness of breath.   albuterol 1.25 MG/3ML nebulizer solution Commonly known as:  ACCUNEB Take 3 mLs by nebulization every 4 (four) hours as needed for wheezing or shortness of breath.   amitriptyline 25 MG tablet Commonly known as:  ELAVIL Take 50 mg by mouth at bedtime.   BAYER CONTOUR TEST test strip Generic drug:  glucose blood PT CHECKS SUGARS TWICE DAILY DX E11.29   CALCIUM-VITAMIN D PO Take 1 tablet by mouth daily.   dexlansoprazole 60 MG capsule Commonly known as:  DEXILANT Take 1 capsule (60 mg total) by mouth 2 (two) times daily.   ferrous sulfate 325 (65 FE) MG tablet Take 325 mg by mouth daily with breakfast.   fexofenadine 180 MG tablet Commonly known as:  ALLEGRA Take 180 mg by mouth daily.   Fluticasone-Salmeterol 250-50  MCG/DOSE Aepb Commonly known as:  ADVAIR Inhale 1 puff into the lungs 2 (two) times daily.   furosemide 20 MG tablet Commonly known as:  LASIX Take 20 mg by mouth every morning.   losartan 50 MG tablet Commonly known as:  COZAAR Take 50 mg by mouth every morning.   methocarbamol 750 MG tablet Commonly known as:  ROBAXIN Take 750 mg by mouth 3 (three) times daily.   MIRALAX powder Generic drug:  polyethylene glycol powder Take 17 g by mouth daily.   montelukast 10 MG tablet Commonly known as:  SINGULAIR Take 10 mg by mouth daily with breakfast.   MULTIVITAMIN PO Take 1 tablet by mouth daily.   oxyCODONE-acetaminophen 5-325 MG tablet Commonly known as:  PERCOCET/ROXICET Take 1 tablet by mouth 6 (six) times daily.   potassium citrate 10 MEQ (1080 MG) SR tablet Commonly known as:  UROCIT-K Take 10 mEq by mouth 2 (two) times daily.   pregabalin 150 MG capsule Commonly known as:  LYRICA Take 150 mg by mouth 2 (two) times daily.   PREVAGEN 10 MG Caps Generic drug:  Apoaequorin Take 10 mg by mouth daily.   PRISTIQ 50 MG 24 hr tablet Generic drug:  desvenlafaxine Take 50 mg by mouth daily.   rosuvastatin 10 MG tablet Commonly known as:  CRESTOR Take 5 mg by mouth every morning.   verapamil 240 MG (CO) 24 hr tablet Commonly known as:  COVERA HS Take 240 mg by mouth every morning.

## 2018-09-09 ENCOUNTER — Telehealth: Payer: Self-pay | Admitting: Gastroenterology

## 2018-09-09 ENCOUNTER — Emergency Department (HOSPITAL_COMMUNITY)
Admission: EM | Admit: 2018-09-09 | Discharge: 2018-09-09 | Disposition: A | Discharge status: Home / Self Care | Payer: 59 | Attending: Emergency Medicine | Admitting: Emergency Medicine

## 2018-09-09 ENCOUNTER — Emergency Department (HOSPITAL_COMMUNITY): Payer: 59

## 2018-09-09 ENCOUNTER — Other Ambulatory Visit: Payer: Self-pay

## 2018-09-09 ENCOUNTER — Encounter (HOSPITAL_COMMUNITY): Payer: Self-pay | Admitting: Emergency Medicine

## 2018-09-09 DIAGNOSIS — R1031 Right lower quadrant pain: Secondary | ICD-10-CM | POA: Diagnosis not present

## 2018-09-09 DIAGNOSIS — Z87891 Personal history of nicotine dependence: Secondary | ICD-10-CM | POA: Insufficient documentation

## 2018-09-09 DIAGNOSIS — I10 Essential (primary) hypertension: Secondary | ICD-10-CM | POA: Diagnosis not present

## 2018-09-09 DIAGNOSIS — Z905 Acquired absence of kidney: Secondary | ICD-10-CM | POA: Insufficient documentation

## 2018-09-09 DIAGNOSIS — Z87442 Personal history of urinary calculi: Secondary | ICD-10-CM | POA: Diagnosis not present

## 2018-09-09 DIAGNOSIS — Z79899 Other long term (current) drug therapy: Secondary | ICD-10-CM | POA: Insufficient documentation

## 2018-09-09 DIAGNOSIS — R109 Unspecified abdominal pain: Secondary | ICD-10-CM

## 2018-09-09 LAB — CBC
HEMATOCRIT: 35.4 % — AB (ref 36.0–46.0)
Hemoglobin: 11 g/dL — ABNORMAL LOW (ref 12.0–15.0)
MCH: 27.6 pg (ref 26.0–34.0)
MCHC: 31.1 g/dL (ref 30.0–36.0)
MCV: 88.9 fL (ref 80.0–100.0)
Platelets: 225 10*3/uL (ref 150–400)
RBC: 3.98 MIL/uL (ref 3.87–5.11)
RDW: 13 % (ref 11.5–15.5)
WBC: 6.5 10*3/uL (ref 4.0–10.5)
nRBC: 0 % (ref 0.0–0.2)

## 2018-09-09 LAB — URINALYSIS, ROUTINE W REFLEX MICROSCOPIC
Bilirubin Urine: NEGATIVE
Glucose, UA: NEGATIVE mg/dL
Hgb urine dipstick: NEGATIVE
Ketones, ur: NEGATIVE mg/dL
Leukocytes, UA: NEGATIVE
Nitrite: NEGATIVE
Protein, ur: NEGATIVE mg/dL
SPECIFIC GRAVITY, URINE: 1.011 (ref 1.005–1.030)
pH: 5 (ref 5.0–8.0)

## 2018-09-09 LAB — BASIC METABOLIC PANEL
ANION GAP: 8 (ref 5–15)
BUN: 39 mg/dL — AB (ref 6–20)
CO2: 24 mmol/L (ref 22–32)
Calcium: 9.2 mg/dL (ref 8.9–10.3)
Chloride: 104 mmol/L (ref 98–111)
Creatinine, Ser: 0.85 mg/dL (ref 0.44–1.00)
GFR calc Af Amer: >60 mL/min (ref >60)
GLUCOSE: 82 mg/dL (ref 70–99)
POTASSIUM: 3.7 mmol/L (ref 3.5–5.1)
Sodium: 136 mmol/L (ref 135–145)

## 2018-09-09 LAB — I-STAT BETA HCG BLOOD, ED (MC, WL, AP ONLY): I-stat hCG, quantitative: <5 m[IU]/mL (ref <5)

## 2018-09-09 NOTE — ED Triage Notes (Signed)
Pt presents with right flank pain. Patient has a known kidney stone on the right and was told by her urologist if she had pain to be seen. Patient went to urgent care first and was sent here. Patient states oliguria today. Patient had a negative UA. Patient does not have left kidney.

## 2018-09-09 NOTE — Discharge Instructions (Signed)
You are seen in the ER for right flank pain.  A CT showed no kidney stones on her right side.  Your urine looked normal without infection or blood.  Your kidney function is normal.  I reviewed your chart and your CT of kidneys 1 year ago did show 2 very small kidney stones on the right side.  It is possible you may have passed the stones.  Stay well-hydrated.  Continue your pain medications at home as prescribed.  Monitor your symptoms for the next 24 to 48 hours.  Return to the ER for worsening pain, nausea, vomiting, no urine output for 4 to 6 hours, abdominal pain, worsening back pain, numbness in your groin, loss of bladder or bowel control, rash.

## 2018-09-09 NOTE — Telephone Encounter (Signed)
Pt called to inform that as of Jan 2020 her insurance will no longer cover dexilant. They will cover Pantoprazole, lanzoprazole, and  Omeprazole. Pt wanted to make Dr. Lavon PaganiniNandigam aware, she will call in December when she needs refill.

## 2018-09-09 NOTE — ED Notes (Signed)
Pt states last time urinated was at [facility] at 1000 and has been drinking a lot of fluids. No urge to go no pain or pressure in  Bladder

## 2018-09-09 NOTE — ED Provider Notes (Signed)
[facility]-EMERGENCY DEPT Provider Note   CSN: [id number] Arrival date & time: 09/09/18  1929     History   Chief Complaint Chief Complaint  Patient presents with  . Flank Pain    HPI Dominique Evans is a 52 y.o. female with history of asthma, chronic cough, chronic back pain, chronic opioid use, gastric bypass surgery, solitary R kidney, is here for evaluation of sudden onset right flank pain described as burning, constant, nonradiating.  States she typically has daily chronic, severe thoracic/low back pain but today the pain is worse on the right side.  She is concerned about her right kidney.  Reports she "lost" her left kidney due to multiple kidney stones many years ago.  She is afraid that she has another kidney stone on her right kidney.  Has noticed associated decreased urine output since 10 AM today and intermittent dysuria.  She has drank 80 ounces of water but had very low urine output.  She went to urgent care and they told her to come to the ER.  She denies any fevers, chills, nausea, vomiting, chest pain, shortness of breath, changes in bowel movements, abnormal vaginal discharge or bleeding, saddle anesthesia, loss of bladder or bowel control, weakness or numbness to extremities.  No recent falls or back injury or injections/procedures. States that her left arm got "messed up" when she had surgery when she was a child and she was told that her pulse and vessels do not work as well in this arm. HPI  Past Medical History:  Diagnosis Date  . Allergic rhinitis   . Anemia   . Arthritis    spondylolisthesis- lumbar region  . Asthma    PFT 08/12/07: FEV1 2.97 (104%), FEV1% 84, TLC 4.89 (90%), DLCO 84%, +BD  . Blood transfusion    hx of 2002   . Blood transfusion without reported diagnosis    kidney, no issues  . Chronic constipation   . 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 Cough 03/23/13  . Depression   .  Diabetes mellitus    pre gastric bypass  . Esophageal dysmotility 03/01/2015  . GERD (gastroesophageal reflux disease)   . H/O hiatal hernia   . 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 History of stress test    exercise stress test, ? where it was done,  about 6 yrs. ago,  results- negative   . Hydatidiform mole   . Hyperlipidemia   . Hypertension   . 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 Obesity   . Panic attacks   . Peripheral neuropathy    bilateral feet  . PONV (postoperative nausea and vomiting)   . Psoriasis   . Seasonal allergies   . Status post dilation of esophageal narrowing   . Unspecified essential hypertension   . Visual disturbance     Patient Active Problem List   Diagnosis Date Noted  . Esophageal dysmotility 03/01/2015  . Colon cancer screening 03/01/2015  . Lateral ventral hernia 03/01/2015  . S/P lumbar spinal fusion 05/23/2014  . Edema 06/28/2013  . 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  . HNP (herniated nucleus pulposus), lumbar 11/30/2012  . THORACIC AORTIC ANEURYSM 10/23/2010  . CONSTIPATION, CHRONIC 03/23/2010  . OBESITY, UNSPECIFIED 10/31/2007  . OBSTRUCTIVE SLEEP APNEA 10/31/2007  . CHRONIC RHINITIS 10/31/2007  . Cough variant asthma 10/31/2007  . GERD 10/31/2007  . DIABETES MELLITUS  10/04/2007  . HYPERTENSION 10/04/2007  . 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 NEPHRECTOMY, HX OF 10/04/2007    Past Surgical History:  Procedure Laterality Date  . BOTOX INJECTION N/A 06/07/2015   Procedure: BOTOX INJECTION;  Surgeon: Louis Meckel, MD;  Location: [facility];  Service: Endoscopy;  Laterality: N/A;  . COLONOSCOPY WITH PROPOFOL N/A 06/07/2015   Procedure: COLONOSCOPY WITH PROPOFOL;  Surgeon: Louis Meckel, MD;  Location: [facility];  Service: Endoscopy;  Laterality: N/A;  . ELBOW FRACTURE SURGERY     left  . ESOPHAGEAL MANOMETRY N/A 04/08/2015   Procedure: ESOPHAGEAL MANOMETRY (EM);  Surgeon: Louis Meckel, MD;   Location: [facility];  Service: Endoscopy;  Laterality: N/A;  . ESOPHAGOGASTRODUODENOSCOPY (EGD) WITH PROPOFOL N/A 06/07/2015   Procedure: ESOPHAGOGASTRODUODENOSCOPY (EGD) WITH PROPOFOL;  Surgeon: Louis Meckel, MD;  Location: [facility];  Service: Endoscopy;  Laterality: N/A;  . GASTRIC ROUX-EN-Y  10/05/2011   Procedure: LAPAROSCOPIC ROUX-EN-Y GASTRIC;  Surgeon: Mariella Saa, MD;  Location: [facility];  Service: General;  Laterality: N/A;  UPPER ENDOSCOPY  . lap band removal     . LAPAROSCOPIC GASTRIC BANDING  02/2006   w/ truncal vagotomy  . LUMBAR LAMINECTOMY/DECOMPRESSION MICRODISCECTOMY Left 11/30/2012   Procedure: MICRO LUMBAR DECOMPRESSION L4-5 ON THE LEFT;  Surgeon: Javier Docker, MD;  Location: [facility];  Service: Orthopedics;  Laterality: Left;  MICRO LUMBAR DECOMPRESSION L4-5 ON THE LEFT  . LUMBAR LAMINECTOMY/DECOMPRESSION MICRODISCECTOMY N/A 03/30/2013   Procedure: REDO MICRO LUMBAR DECOMPRESSION L4-5;  Surgeon: Javier Docker, MD;  Location: [facility];  Service: Orthopedics;  Laterality: N/A;  . MAXIMUM ACCESS (MAS)POSTERIOR LUMBAR INTERBODY FUSION (PLIF) 1 LEVEL N/A 05/23/2014   Procedure: FOR MAXIMUM ACCESS (MAS) POSTERIOR LUMBAR INTERBODY FUSION (PLIF) Lumbar Four-Five;  Surgeon: Tia Alert, MD;  Location: [facility];  Service: Neurosurgery;  Laterality: N/A;  FOR MAXIMUM ACCESS (MAS) POSTERIOR LUMBAR INTERBODY FUSION (PLIF) Lumbar Four-Five  . NEPHRECTOMY  2002   left  . PATENT DUCTUS ARTERIOUS REPAIR    . SHOULDER ARTHROSCOPY     right shoulder     OB History   None      Home Medications    Prior to Admission medications   Medication Sig Start Date End Date Taking? Authorizing Provider  amitriptyline (ELAVIL) 25 MG tablet Take 50 mg by mouth at bedtime.    Yes [provider]  Apoaequorin (PREVAGEN) 10 MG CAPS Take 10 mg by mouth daily.   Yes [provider]  CALCIUM-VITAMIN D PO Take 1 tablet by mouth daily.    Yes [provider]    dexlansoprazole (DEXILANT) 60 MG capsule Take 1 capsule (60 mg total) by mouth 2 (two) times daily. 11/12/17  Yes Nandigam, Eleonore Chiquito, MD  ferrous sulfate 325 (65 FE) MG tablet Take 325 mg by mouth daily with breakfast.   Yes [provider]  fexofenadine (ALLEGRA) 180 MG tablet Take 180 mg by mouth daily.   Yes [provider]  furosemide (LASIX) 20 MG tablet Take 20 mg by mouth every morning.    Yes [provider]  losartan (COZAAR) 50 MG tablet Take 50 mg by mouth every morning.  09/21/11  Yes [provider]  methocarbamol (ROBAXIN) 750 MG tablet Take 750 mg by mouth 3 (three) times daily.   Yes [provider]  montelukast (SINGULAIR) 10 MG tablet Take 10 mg by mouth at bedtime.    Yes [provider]  Multiple Vitamins-Minerals (MULTIVITAMIN PO) Take  1 tablet by mouth daily.    Yes [provider]  oxyCODONE-acetaminophen (PERCOCET/ROXICET) 5-325 MG tablet Take 1 tablet by mouth every 4 (four) hours.    Yes [provider]  polyethylene glycol (MIRALAX) powder Take 17 g by mouth daily.    Yes [provider]  potassium citrate (UROCIT-K) 10 MEQ (1080 MG) SR tablet Take 10 mEq by mouth 2 (two) times daily.    Yes [provider]  pregabalin (LYRICA) 150 MG capsule Take 150 mg by mouth 2 (two) times daily.   Yes [provider]  PRISTIQ 50 MG 24 hr tablet Take 50 mg by mouth daily. 04/20/15  Yes [provider]  rosuvastatin (CRESTOR) 10 MG tablet Take 5 mg by mouth every morning.    Yes [provider]  Secukinumab, 300 MG Dose, (COSENTYX, 300 MG DOSE,) 150 MG/ML SOSY Inject 300 mg into the skin every 30 (thirty) days.   Yes [provider]  verapamil (COVERA HS) 240 MG (CO) 24 hr tablet Take 240 mg by mouth every morning.    Yes [provider]  albuterol (ACCUNEB) 1.25 MG/3ML nebulizer solution Take 3 mLs by nebulization every 4 (four) hours as needed for  wheezing or shortness of breath.  11/20/14   [provider]  albuterol (PROVENTIL HFA;VENTOLIN HFA) 108 (90 BASE) MCG/ACT inhaler Inhale 1-2 puffs into the lungs every 4 (four) hours as needed for wheezing or shortness of breath.    [provider]  BAYER CONTOUR TEST test strip PT CHECKS SUGARS TWICE DAILY DX E11.29 09/23/15   [provider]    Family History Family History  Problem Relation Age of Onset  . Hypertension Father   . Skin cancer Father   . Heart disease Father        before age 63  . Heart attack Father   . Hyperlipidemia Father   . Diabetes Mother   . Hypertension Mother   . Skin cancer Mother   . Breast cancer Mother        Melanoma  . Heart disease Mother   . Hyperlipidemia Mother   . Lung cancer Mother   . Brain cancer Mother   . Hypertension Sister   . Heart disease Sister        before age 41  . Heart attack Sister   . Hyperlipidemia Sister   . Breast cancer Maternal Aunt   . Colon cancer Neg Hx   . Colon polyps Neg Hx   . Esophageal cancer Neg Hx   . Gallbladder disease Neg Hx   . Stomach cancer Neg Hx     Social History Social History   Tobacco Use  . Smoking status: Former Smoker    Packs/day: 2.00    Types: Cigarettes    Last attempt to quit: 10/19/1982    Years since quitting: 35.9  . Smokeless tobacco: Never Used  Substance Use Topics  . Alcohol use: No    Alcohol/week: 0.0 standard drinks  . Drug use: No     Allergies   Penicillins; Sulfonamide derivatives; Clindamycin/lincomycin; and Ciprofloxacin   Review of Systems Review of Systems  Genitourinary: Positive for decreased urine volume and flank pain.  All other systems reviewed and are negative.    Physical Exam Updated Vital Signs BP 103/73 (BP Location: Right Arm)   Pulse 68   Temp 97.7 F (36.5 C) (Oral)   Resp 18   LMP 12/21/2015   SpO2 100%   Physical Exam  Constitutional: She is oriented to person, place, and time. She appears  well-developed and well-nourished. No distress.  NAD.  HENT:  Head: Normocephalic and atraumatic.  Right Ear: External ear normal.  Left Ear: External ear normal.  Nose: Nose normal.  Eyes: Conjunctivae and EOM are normal. No scleral icterus.  Neck: Normal range of motion. Neck supple.  Cardiovascular: Normal rate, regular rhythm and normal heart sounds.  2+ right radial pulse.  1+ left radial pulse.  Patient states this is chronic, attributes it to an injury during a surgery while she was a child.  1+ DP pulses bilaterally.  No pitting edema to her extremities.  Pulmonary/Chest: Effort normal and breath sounds normal.  Abdominal: Soft. There is tenderness.  No anterior abd tenderness. Negative Murphy's and McBurney's. Bilateral right CVA tenderness with percussion and deep palpation, difficult exam as patient has chronic back pain and states her back pain does typically radiate from her low back to her thoracic back.  Musculoskeletal: Normal range of motion. She exhibits tenderness. She exhibits no deformity.  TL spine: Diffuse midline tenderness.  Diffuse paraspinal muscle tenderness.  Patient states this is from her chronic back pain.  Percussion to the thoracic paraspinal muscles reproduces her pain.  Midline lumbar and left flank surgical scars noted.  No rash to the back.  Neurological: She is alert and oriented to person, place, and time.  Sensation to light touch and strength intact in upper and lower extremities.  Knee DTRs intact bilaterally.  Skin: Skin is warm and dry. Capillary refill takes less than 2 seconds.  Psychiatric: She has a normal mood and affect. Her behavior is normal. Judgment and thought content normal.  Nursing note and vitals reviewed.    ED Treatments / Results  Labs (all labs ordered are listed, but only abnormal results are displayed) Labs Reviewed  BASIC METABOLIC PANEL - Abnormal; Notable for the following components:      Result Value   BUN 39 (*)     All other components within normal limits  CBC - Abnormal; Notable for the following components:   Hemoglobin 11.0 (*)    HCT 35.4 (*)    All other components within normal limits  URINE CULTURE  URINALYSIS, ROUTINE W REFLEX MICROSCOPIC  I-STAT BETA HCG BLOOD, ED ([facility], [facility], [facility] ONLY)    EKG None  Radiology Ct Renal Stone Study  Result Date: 09/09/2018 CLINICAL DATA:  RIGHT flank pain. Known kidney stone. Oliguria today. Patient does not have LEFT kidney. EXAM: CT ABDOMEN AND PELVIS WITHOUT CONTRAST TECHNIQUE: Multidetector CT imaging of the abdomen and pelvis was performed following the standard protocol without IV contrast. COMPARISON:  CT abdomen dated 08/12/2017. FINDINGS: Lower chest: No acute abnormality. Hepatobiliary: Small layering stones within the otherwise normal-appearing gallbladder. No focal liver abnormality is seen. No bile duct dilatation seen. Pancreas: Partially infiltrated with fat but otherwise unremarkable. Spleen: Normal in size without focal abnormality. Adrenals/Urinary Tract: LEFT kidney is surgically absent. RIGHT kidney appears normal without mass, stone or hydronephrosis. No ureteral or bladder calculi identified. Bladder appears normal, partially decompressed. Stomach/Bowel: No dilated large or small bowel loops. Fairly large amount of stool and gas within the colon. Surgical small bowel anastomoses within the central abdomen and about the stomach, presumably prior gastric bypass surgery. Stomach and small bowel are otherwise unremarkable. Appendix is unremarkable. Vascular/Lymphatic: Aortic atherosclerosis. No enlarged abdominal or pelvic lymph nodes. Reproductive: No adnexal mass or free fluid. Other: No free fluid or abscess collection. No free intraperitoneal  air. Musculoskeletal: Fixation hardware within the lower lumbar spine appears appropriately positioned. No acute or suspicious osseous finding. IMPRESSION: 1. No acute findings within the abdomen or pelvis. No  RIGHT-sided renal or ureteral calculi. No bowel obstruction or evidence of bowel wall inflammation. Appendix is normal. 2. Fairly large amount of stool and gas within the colon (constipation?). 3. Cholelithiasis without evidence of acute cholecystitis. 4. Additional chronic/incidental findings detailed above. Aortic Atherosclerosis (ICD10-I70.0). Electronically Signed   By: Bary Richard M.D.   On: 09/09/2018 21:29    Procedures Procedures (including critical care time)  Medications Ordered in ED Medications - No data to display   Initial Impression / Assessment and Plan / ED Course  I have reviewed the triage vital signs and the nursing notes.  Pertinent labs & imaging results that were available during my care of the patient were reviewed by me and considered in my medical decision making (see chart for details).  Clinical Course as of Sep 09 2350  Fri Sep 09, 2018  2201 IMPRESSION: 1. No acute findings within the abdomen or pelvis. No RIGHT-sided renal or ureteral calculi. No bowel obstruction or evidence of bowel wall inflammation. Appendix is normal. 2. Fairly large amount of stool and gas within the colon (constipation?). 3. Cholelithiasis without evidence of acute cholecystitis. 4. Additional chronic/incidental findings detailed above  CT RENAL STONE STUDY [CG]    Clinical Course User Index [CG] Liberty Handy, PA-C   High suspicion for renal stone versus UTI/pyelonephritis.  Patient does have chronic thoracic and low back pain so this could also be acute on chronic musculoskeletal associated pain breakthrough.  No recent falls or trauma.  No recent injections or procedures.  She has no fevers or chills and I doubt epidural abscess or hematoma.  Doubt vertebral fracture.  She has no overlying rash to suggest shingles.  She denies cauda equina symptoms.  No changes to her bowel movements to suggest gut process.  I considered dissection however she is normotensive, without any  new neuro deficits.  She does have decreased pulse to her left upper extremity however she states this is chronic and secondary to a surgery during childhood.  She has no abdominal tenderness, negative Murphy's and McBurney's.  We will obtain screening labs, urinalysis, CT renal.  No cauda equina symptoms to warrant MRI.  2330: Urinalysis normal.  Creatinine normal.  WBC normal.  CT without any right-sided renal or ureteral calculi, obstruction, bowel inflammation.  She does have large amounts of stool and gas consistent with constipation.  Patient was updated on results.  She feels much better that her right kidney is okay.  We will discharge her with supportive care.  Patient is agreeable with this plan.  2345: I did not notify pt of gallstones prior to DC.  I called her and spoke with her about this.  Recommended she monitors for anterior/RUQ abdominal pain, nausea, vomiting and return to ER if she develops these symptoms. She verbalized understanding.  Final diagnoses:  Flank pain    ED Discharge Orders    None       Jerrell Mylar 09/09/18 2351    Charlynne Pander, MD 09/10/18 1450

## 2018-09-11 LAB — URINE CULTURE: Culture: NO GROWTH

## 2018-09-19 ENCOUNTER — Other Ambulatory Visit: Payer: Self-pay

## 2018-09-19 MED ORDER — LANSOPRAZOLE 30 MG PO CPDR: 30.0000 mg | DELAYED_RELEASE_CAPSULE | Freq: Two times a day (BID) | ORAL | 3 refills | Status: DC

## 2018-09-19 MED ORDER — LANSOPRAZOLE 30 MG PO CPDR: 30.0000 mg | DELAYED_RELEASE_CAPSULE | Freq: Every day | ORAL | 3 refills | Status: DC

## 2018-09-19 NOTE — Telephone Encounter (Signed)
Ok to switch to Lansoprazole 30mg  BID. Thanks

## 2018-09-19 NOTE — Telephone Encounter (Signed)
Printed and mailed to the patient. Called and left her a message with the plan.

## 2018-09-24 DIAGNOSIS — M545 Low back pain, unspecified: Secondary | ICD-10-CM | POA: Insufficient documentation

## 2018-09-24 DIAGNOSIS — K432 Incisional hernia without obstruction or gangrene: Secondary | ICD-10-CM | POA: Insufficient documentation

## 2018-09-24 DIAGNOSIS — G8929 Other chronic pain: Secondary | ICD-10-CM | POA: Insufficient documentation

## 2018-09-24 DIAGNOSIS — F329 Major depressive disorder, single episode, unspecified: Secondary | ICD-10-CM

## 2018-09-24 DIAGNOSIS — Z9884 Bariatric surgery status: Secondary | ICD-10-CM | POA: Insufficient documentation

## 2018-09-24 HISTORY — DX: Other chronic pain: G89.29

## 2018-09-24 HISTORY — DX: Major depressive disorder, single episode, unspecified: F32.9

## 2018-10-24 ENCOUNTER — Other Ambulatory Visit: Payer: Self-pay | Admitting: Gastroenterology

## 2018-10-24 DIAGNOSIS — R079 Chest pain, unspecified: Secondary | ICD-10-CM

## 2019-02-16 DIAGNOSIS — E785 Hyperlipidemia, unspecified: Secondary | ICD-10-CM | POA: Insufficient documentation

## 2019-02-22 ENCOUNTER — Other Ambulatory Visit: Payer: Self-pay | Admitting: Internal Medicine

## 2019-02-22 DIAGNOSIS — R413 Other amnesia: Secondary | ICD-10-CM

## 2019-03-01 ENCOUNTER — Telehealth: Payer: Self-pay | Admitting: *Deleted

## 2019-03-01 ENCOUNTER — Other Ambulatory Visit: Payer: Self-pay

## 2019-03-01 ENCOUNTER — Ambulatory Visit
Admission: RE | Admit: 2019-03-01 | Discharge: 2019-03-01 | Disposition: A | Discharge status: Home / Self Care | Payer: 59 | Source: Ambulatory Visit | Attending: Internal Medicine | Admitting: Internal Medicine

## 2019-03-01 ENCOUNTER — Encounter: Payer: Self-pay | Admitting: *Deleted

## 2019-03-01 DIAGNOSIS — R413 Other amnesia: Secondary | ICD-10-CM

## 2019-03-01 MED ORDER — GADOBENATE DIMEGLUMINE 529 MG/ML IV SOLN
17.0000 mL | Freq: Once | INTRAVENOUS | Status: AC | PRN
  Administered 2019-03-01: 17 mL via INTRAVENOUS

## 2019-03-01 NOTE — Telephone Encounter (Signed)
Spoke with son, Franky Macho with patient's permission and updated her EMR. He will assist with video visit tomorrow. She lives with him now.

## 2019-03-02 ENCOUNTER — Ambulatory Visit (INDEPENDENT_AMBULATORY_CARE_PROVIDER_SITE_OTHER): Payer: 59 | Admitting: Diagnostic Neuroimaging

## 2019-03-02 ENCOUNTER — Encounter: Payer: Self-pay | Admitting: Diagnostic Neuroimaging

## 2019-03-02 DIAGNOSIS — M545 Low back pain: Secondary | ICD-10-CM

## 2019-03-02 DIAGNOSIS — G8929 Other chronic pain: Secondary | ICD-10-CM

## 2019-03-02 DIAGNOSIS — R413 Other amnesia: Secondary | ICD-10-CM

## 2019-03-02 NOTE — Progress Notes (Signed)
[facility]  PATIENT: Dominique Evans DOB: 02-11-1966  REFERRING CLINICIAN: [name] HISTORY FROM: patient and son REASON FOR VISIT: new consult    HISTORICAL  CHIEF COMPLAINT:  Chief Complaint  Patient presents with  . Memory Loss    HISTORY OF PRESENT ILLNESS:   53 year old female here for evaluation of memory loss.  Patient does not notice any major memory problems.  However family was noticed this problem starting about 2018, immediately after her father passed away.  Ever since that time she has been "going downhill".  She has short-term memory problems, word finding difficulties, naming difficulties.  She is having more difficulty in the last few months.  Patient had some lab testing and MRI of the brain which were unremarkable.  Patient been referred here for further evaluation.  There are several family members with dementia starting at age 17 to 55 years old.  Patient also had an accident in 2013 where she slipped on the ground, fell down and injured her low back.  This required multiple low back surgeries.  She has been left with daily severe chronic pain and is on pain medications.    REVIEW OF SYSTEMS: Full 14 system review of systems performed and negative with exception of: As per HPI.  ALLERGIES: Allergies  Allergen Reactions  . Penicillins Anaphylaxis    Has patient had a PCN reaction causing immediate rash, facial/tongue/throat swelling, SOB or lightheadedness with hypotension: yes Has patient had a PCN reaction causing severe rash involving mucus membranes or skin necrosis: unknown Has patient had a PCN reaction that required hospitalization unknown Has patient had a PCN reaction occurring within the last 10 years: no If all of the above answers are "NO", then may proceed with Cephalosporin use.   . Sulfonamide Derivatives Anaphylaxis  . Clindamycin/Lincomycin Nausea And Vomiting  . Ciprofloxacin Hives    HOME MEDICATIONS:  Outpatient Medications Prior to Visit  Medication Sig Dispense Refill  . albuterol (ACCUNEB) 1.25 MG/3ML nebulizer solution Take 3 mLs by nebulization every 4 (four) hours as needed for wheezing or shortness of breath.   3  . albuterol (PROVENTIL HFA;VENTOLIN HFA) 108 (90 BASE) MCG/ACT inhaler Inhale 1-2 puffs into the lungs every 4 (four) hours as needed for wheezing or shortness of breath.    Marland Kitchen amitriptyline (ELAVIL) 25 MG tablet Take 50 mg by mouth at bedtime.     Marland Kitchen Apoaequorin (PREVAGEN) 10 MG CAPS Take 10 mg by mouth daily.    Marland Kitchen BAYER CONTOUR TEST test strip PT CHECKS SUGARS TWICE DAILY DX E11.29  5  . CALCIUM-VITAMIN D PO Take 1 tablet by mouth daily.     Marland Kitchen dexlansoprazole (DEXILANT) 60 MG capsule Dexilant 60 mg capsule, delayed release  twice daily    . ferrous sulfate 325 (65 FE) MG tablet Take 325 mg by mouth daily with breakfast.    . fexofenadine (ALLEGRA) 180 MG tablet Take 180 mg by mouth daily.    . Fluticasone-Salmeterol (ADVAIR DISKUS) 250-50 MCG/DOSE AEPB 2 (two) times daily.    . furosemide (LASIX) 20 MG tablet Take 20 mg by mouth every morning.     . lansoprazole (PREVACID) 30 MG capsule Take 1 capsule (30 mg total) by mouth 2 (two) times daily before a meal. Wait at least 30 minutes before eating (Patient not taking: Reported on 03/01/2019) 180 capsule 3  . losartan (COZAAR) 50 MG tablet Take 50 mg by mouth every morning.     . methocarbamol (ROBAXIN) 750 MG tablet Take  750 mg by mouth 3 (three) times daily.    . montelukast (SINGULAIR) 10 MG tablet Take 10 mg by mouth at bedtime.     . Multiple Vitamins-Minerals (MULTIVITAMIN PO) Take 1 tablet by mouth daily.     Marland Kitchen. oxyCODONE-acetaminophen (PERCOCET/ROXICET) 5-325 MG tablet Take 1 tablet by mouth every 4 (four) hours.     . polyethylene glycol (MIRALAX) powder Take 17 g by mouth daily.     . potassium citrate (UROCIT-K) 10 MEQ (1080 MG) SR tablet Take 10 mEq by mouth 2 (two) times daily.     . pregabalin (LYRICA) 150 MG  capsule Take 150 mg by mouth 2 (two) times daily.    Marland Kitchen. PRISTIQ 50 MG 24 hr tablet Take 50 mg by mouth daily.  3  . rosuvastatin (CRESTOR) 10 MG tablet Take 5 mg by mouth every morning.     . Secukinumab, 300 MG Dose, (COSENTYX, 300 MG DOSE,) 150 MG/ML SOSY Inject 300 mg into the skin every 30 (thirty) days.    . verapamil (COVERA HS) 240 MG (CO) 24 hr tablet Take 240 mg by mouth every morning.      No facility-administered medications prior to visit.     PAST MEDICAL HISTORY: Past Medical History:  Diagnosis Date  . Allergic rhinitis   . Anemia   . Arthritis    spondylolisthesis- lumbar region  . Asthma    PFT 08/12/07: FEV1 2.97 (104%), FEV1% 84, TLC 4.89 (90%), DLCO 84%, +BD  . Blood transfusion    hx of 2002   . Blood transfusion without reported diagnosis    kidney, no issues  . Chronic constipation   . 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 Cough 03/23/13  . Depression   . Diabetes mellitus    pre gastric bypass  . Esophageal dysmotility 03/01/2015  . GERD (gastroesophageal reflux disease)   . H/O hiatal hernia   . 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 History of stress test    exercise stress test, ? where it was done,  about 6 yrs. ago,  results- negative   . Hydatidiform mole   . Hyperlipidemia   . Hypertension   . 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 Obesity   . Panic attacks   . Peripheral neuropathy    bilateral feet  . PONV (postoperative nausea and vomiting)   . Psoriasis   . Seasonal allergies   . Status post dilation of esophageal narrowing   . Unspecified essential hypertension   . Visual disturbance     PAST SURGICAL HISTORY: Past Surgical History:  Procedure Laterality Date  . BOTOX INJECTION N/A 06/07/2015   Procedure: BOTOX INJECTION;  Surgeon: Louis Meckelobert D Kaplan, MD;  Location: [facility];  Service: Endoscopy;  Laterality: N/A;  . COLONOSCOPY WITH PROPOFOL N/A 06/07/2015    Procedure: COLONOSCOPY WITH PROPOFOL;  Surgeon: Louis Meckelobert D Kaplan, MD;  Location: [facility];  Service: Endoscopy;  Laterality: N/A;  . ELBOW FRACTURE SURGERY     left  . ESOPHAGEAL MANOMETRY N/A 04/08/2015   Procedure: ESOPHAGEAL MANOMETRY (EM);  Surgeon: Louis Meckelobert D Kaplan, MD;  Location: [facility];  Service: Endoscopy;  Laterality: N/A;  . ESOPHAGOGASTRODUODENOSCOPY (EGD) WITH PROPOFOL N/A 06/07/2015   Procedure: ESOPHAGOGASTRODUODENOSCOPY (EGD) WITH PROPOFOL;  Surgeon: Louis Meckelobert D Kaplan, MD;  Location: [facility];  Service: Endoscopy;  Laterality: N/A;  . GASTRIC ROUX-EN-Y  10/05/2011  Procedure: LAPAROSCOPIC ROUX-EN-Y GASTRIC;  Surgeon: Mariella Saa, MD;  Location: [facility];  Service: General;  Laterality: N/A;  UPPER ENDOSCOPY  . lap band removal     . LAPAROSCOPIC GASTRIC BANDING  02/2006   w/ truncal vagotomy  . LUMBAR LAMINECTOMY/DECOMPRESSION MICRODISCECTOMY Left 11/30/2012   Procedure: MICRO LUMBAR DECOMPRESSION L4-5 ON THE LEFT;  Surgeon: Javier Docker, MD;  Location: [facility];  Service: Orthopedics;  Laterality: Left;  MICRO LUMBAR DECOMPRESSION L4-5 ON THE LEFT  . LUMBAR LAMINECTOMY/DECOMPRESSION MICRODISCECTOMY N/A 03/30/2013   Procedure: REDO MICRO LUMBAR DECOMPRESSION L4-5;  Surgeon: Javier Docker, MD;  Location: [facility];  Service: Orthopedics;  Laterality: N/A;  . MAXIMUM ACCESS (MAS)POSTERIOR LUMBAR INTERBODY FUSION (PLIF) 1 LEVEL N/A 05/23/2014   Procedure: FOR MAXIMUM ACCESS (MAS) POSTERIOR LUMBAR INTERBODY FUSION (PLIF) Lumbar Four-Five;  Surgeon: Tia Alert, MD;  Location: [facility];  Service: Neurosurgery;  Laterality: N/A;  FOR MAXIMUM ACCESS (MAS) POSTERIOR LUMBAR INTERBODY FUSION (PLIF) Lumbar Four-Five  . NEPHRECTOMY  2002   left  . PATENT DUCTUS ARTERIOUS REPAIR    . SHOULDER ARTHROSCOPY     right shoulder    FAMILY HISTORY: Family History  Problem Relation Age of Onset  . Hypertension Father   . Skin cancer Father   . Heart disease Father         before age 28  . Heart attack Father   . Hyperlipidemia Father   . Diabetes Mother   . Hypertension Mother   . Skin cancer Mother   . Breast cancer Mother        Melanoma  . Heart disease Mother   . Hyperlipidemia Mother   . Lung cancer Mother   . Brain cancer Mother   . Hypertension Sister   . Heart disease Sister        before age 65  . Heart attack Sister   . Hyperlipidemia Sister   . Breast cancer Maternal Aunt   . Colon cancer Neg Hx   . Colon polyps Neg Hx   . Esophageal cancer Neg Hx   . Gallbladder disease Neg Hx   . Stomach cancer Neg Hx     SOCIAL HISTORY: Social History   Socioeconomic History  . Marital status: Divorced    Spouse name: Not on file  . Number of children: 3  . Years of education: Not on file  . Highest education level: Associate degree: academic program  Occupational History  . Occupation: Sr. Engineer, maintenance: [employer]  Social Needs  . Financial resource strain: Not on file  . Food insecurity:    Worry: Not on file    Inability: Not on file  . Transportation needs:    Medical: Not on file    Non-medical: Not on file  Tobacco Use  . Smoking status: Former Smoker    Packs/day: 2.00    Types: Cigarettes    Last attempt to quit: 10/19/1982    Years since quitting: 36.3  . Smokeless tobacco: Never Used  Substance and Sexual Activity  . Alcohol use: No    Alcohol/week: 0.0 standard drinks  . Drug use: No  . Sexual activity: Not on file  Lifestyle  . Physical activity:    Days per week: Not on file    Minutes per session: Not on file  . Stress: Not on file  Relationships  . Social connections:    Talks on phone: Not on file    Gets  together: Not on file    Attends religious service: Not on file    Active member of club or organization: Not on file    Attends meetings of clubs or organizations: Not on file    Relationship status: Not on file  . Intimate partner violence:    Fear of current or ex partner: Not  on file    Emotionally abused: Not on file    Physically abused: Not on file    Forced sexual activity: Not on file  Other Topics Concern  . Not on file  Social History Narrative   03/01/19 lives with son, Franky Macho   Caffeine- diet pepsi, dr pepper, coffee     PHYSICAL EXAM    VIDEO EXAM  GENERAL EXAM/CONSTITUTIONAL:  Vitals: There were no vitals filed for this visit.  There is no height or weight on file to calculate BMI. Wt Readings from Last 3 Encounters:  07/04/18 181 lb 9.6 oz (82.4 kg)  07/29/17 205 lb 6.4 oz (93.2 kg)  07/28/16 220 lb 3.2 oz (99.9 kg)     Patient is in no distress; well developed, nourished and groomed; neck is supple   NEUROLOGIC: MENTAL STATUS:  MMSE - Mini Mental State Exam 03/02/2019  Orientation to time 2  Orientation to time comments 2020, May, 12, Wed, ?  Orientation to Place 4  Orientation to Place-comments [place], [place], ?, son's home, 1  Registration 3  Attention/ Calculation 5  Attention/Calculation-comments 93, 86, 79, 72, 65  Recall 0  Language- name 2 objects 1  Language- repeat 1  Language- follow 3 step command 3  Language- read & follow direction 1  Write a sentence 1  Copy design 1  Total score 22    awake, alert, oriented to person; SLOW RESPONSES; SAYS "I DONT KNOW" THROUGHOUT  DECR memory   normal attention and concentration  language fluent, comprehension intact, CALLS PEN A "PENCIL"  fund of knowledge appropriate  CRANIAL NERVE:   2nd, 3rd, 4th, 6th - visual fields full to confrontation, extraocular muscles intact, no nystagmus  5th - facial sensation symmetric  7th - facial strength symmetric  8th - hearing intact  11th - shoulder shrug symmetric  12th - tongue protrusion midline  MOTOR:   NO TREMOR; NO DRIFT IN BUE  SENSORY:   normal and symmetric to light touch  COORDINATION:   fine finger movements normal    DIAGNOSTIC DATA (LABS, IMAGING, TESTING) - I reviewed patient  records, labs, notes, testing and imaging myself where available.  Lab Results  Component Value Date   WBC 6.5 09/09/2018   HGB 11.0 (L) 09/09/2018   HCT 35.4 (L) 09/09/2018   MCV 88.9 09/09/2018   PLT 225 09/09/2018      Component Value Date/Time   NA 136 09/09/2018 2040   K 3.7 09/09/2018 2040   CL 104 09/09/2018 2040   CO2 24 09/09/2018 2040   GLUCOSE 82 09/09/2018 2040   BUN 39 (H) 09/09/2018 2040   CREATININE 0.85 09/09/2018 2040   CREATININE 0.87 01/06/2013 0936   CALCIUM 9.2 09/09/2018 2040   PROT 7.1 01/06/2013 0936   ALBUMIN 4.2 01/06/2013 0936   AST 15 01/06/2013 0936   ALT 15 01/06/2013 0936   ALKPHOS 73 01/06/2013 0936   BILITOT 0.4 01/06/2013 0936   GFRNONAA >60 09/09/2018 2040   GFRAA >60 09/09/2018 2040   Lab Results  Component Value Date   CHOL 119 01/06/2013   HDL 36 (L) 01/06/2013  LDLCALC 60 01/06/2013   TRIG 113 01/06/2013   CHOLHDL 3.3 01/06/2013   Lab Results  Component Value Date   HGBA1C 6.0 (H) 01/06/2013   Lab Results  Component Value Date   VITAMINB12 1,045 (H) 01/06/2013   Lab Results  Component Value Date   TSH 1.525 07/17/2011    03/01/19 MRI brain [I reviewed images myself and agree with interpretation. -VRP]  - No acute abnormality. Two small hyperintensities in the deep right frontal white matter nonspecific. Differential diagnosis includes chronic ischemia, complex migraine headaches. Demyelinating disease not considered likely. Otherwise negative.   ASSESSMENT AND PLAN  53 y.o. year old female here with gradual onset of memory loss, word find difficulty is in 02-27-17, immediately following death of her father, could represent post traumatic cognitive problems.  Also with chronic pain, depression, insomnia.  We will proceed with further work-up to rule out other etiologies of memory problems.   Dx:  1. Memory loss   2. Chronic bilateral low back pain without sciatica     Virtual Visit via Video Note  I connected with  Ardel Jagger on 03/02/19 at  4:00 PM EDT by a video enabled telemedicine application and verified that I am speaking with the correct person using two identifiers.  Location: Patient: home Provider: office   I discussed the limitations of evaluation and management by telemedicine and the availability of in person appointments. The patient expressed understanding and agreed to proceed.   I discussed the assessment and treatment plan with the patient. The patient was provided an opportunity to ask questions and all were answered. The patient agreed with the plan and demonstrated an understanding of the instructions.   The patient was advised to call back or seek an in-person evaluation if the symptoms worsen or if the condition fails to improve as anticipated.  I provided 35 minutes of non-face-to-face time during this encounter.    PLAN:  - check neuropsychology testing - check amyloid PET scan - check B12 level  Orders Placed This Encounter  Procedures  . NM PET Brain Amyloid  . Vitamin B12  . Ambulatory referral to Neuropsychology   Return for pending if symptoms worsen or fail to improve.    Suanne Marker, MD 03/02/2019, 4:23 PM Certified in Neurology, Neurophysiology and Neuroimaging  [facility] [address], [address] [place], [place] [address] [phone]

## 2019-03-14 ENCOUNTER — Other Ambulatory Visit: Payer: Self-pay | Admitting: *Deleted

## 2019-03-14 ENCOUNTER — Other Ambulatory Visit: Payer: Self-pay | Admitting: Diagnostic Neuroimaging

## 2019-03-14 DIAGNOSIS — R413 Other amnesia: Secondary | ICD-10-CM

## 2019-03-20 ENCOUNTER — Encounter: Payer: Self-pay | Admitting: Diagnostic Neuroimaging

## 2019-03-20 ENCOUNTER — Telehealth: Payer: Self-pay | Admitting: Diagnostic Neuroimaging

## 2019-03-20 NOTE — Telephone Encounter (Signed)
Called patient and advised her that Dr Marjory Lies ordered Vit B12 lab test. Explained she needs to come to office during lab hours (provided that), wear a mask and advise lady out front she's here for lab only. She has dr apt tomorrow morning and will come afterwards. I advised her the PET scan he ordered needs peer to peer review before insurance will approve it. At that time she'll get a call to schedule PET scan. Advised her referral for neuropsych testing was sent to Dr Karel Jarvis, [name]. She wrote down all information,  verbalized understanding, appreciation.

## 2019-03-20 NOTE — Telephone Encounter (Signed)
Pt called stating she does not know what test she is to do next. Please advise. She states that if she does not pick up you can leave a detailed message on her VM.

## 2019-03-21 ENCOUNTER — Other Ambulatory Visit: Payer: Self-pay

## 2019-03-21 ENCOUNTER — Other Ambulatory Visit (INDEPENDENT_AMBULATORY_CARE_PROVIDER_SITE_OTHER): Payer: Self-pay

## 2019-03-21 DIAGNOSIS — Z0289 Encounter for other administrative examinations: Secondary | ICD-10-CM

## 2019-03-21 DIAGNOSIS — R413 Other amnesia: Secondary | ICD-10-CM

## 2019-03-22 ENCOUNTER — Telehealth: Payer: Self-pay | Admitting: *Deleted

## 2019-03-22 LAB — VITAMIN B12: Vitamin B-12: 397 pg/mL (ref 232–1245)

## 2019-03-22 NOTE — Telephone Encounter (Signed)
Spoke with patient and informed her the Vit B12 lab is normal. She stated she wanted to let Dr Marjory Lies know that she  stopped methocarbamol today because pain management told her it may cause memory loss. She had been on it x 7 years. I advised her that hopefully the peer to peer for her PET scan can be done soon. She verbalized understanding, appreciation of call.

## 2019-03-23 NOTE — Telephone Encounter (Signed)
pls setup for tomorrow or thurs. -VRP

## 2019-03-23 NOTE — Telephone Encounter (Signed)
Called [employer] to schedule peer to peer for PET scan. Peer to Peer with Dr Clearence Cheek, Friday, March 24, 2019, 9:00 am EST. Provider must know reason for denial and have medical records available.  Email confirmation received and forwarded to Dr Marjory Lies.

## 2019-04-05 NOTE — Telephone Encounter (Signed)
PET Scan was denied even with Peer to Peer .

## 2019-04-10 ENCOUNTER — Other Ambulatory Visit: Payer: Self-pay

## 2019-04-10 ENCOUNTER — Emergency Department (HOSPITAL_COMMUNITY)
Admission: EM | Admit: 2019-04-10 | Discharge: 2019-04-11 | Disposition: A | Discharge status: Home / Self Care | Payer: 59 | Attending: Emergency Medicine | Admitting: Emergency Medicine

## 2019-04-10 ENCOUNTER — Emergency Department (HOSPITAL_COMMUNITY): Payer: 59

## 2019-04-10 ENCOUNTER — Encounter (HOSPITAL_COMMUNITY): Payer: Self-pay

## 2019-04-10 DIAGNOSIS — Y929 Unspecified place or not applicable: Secondary | ICD-10-CM | POA: Diagnosis not present

## 2019-04-10 DIAGNOSIS — Y9301 Activity, walking, marching and hiking: Secondary | ICD-10-CM | POA: Diagnosis not present

## 2019-04-10 DIAGNOSIS — L089 Local infection of the skin and subcutaneous tissue, unspecified: Secondary | ICD-10-CM | POA: Insufficient documentation

## 2019-04-10 DIAGNOSIS — W2209XA Striking against other stationary object, initial encounter: Secondary | ICD-10-CM | POA: Insufficient documentation

## 2019-04-10 DIAGNOSIS — E119 Type 2 diabetes mellitus without complications: Secondary | ICD-10-CM | POA: Diagnosis not present

## 2019-04-10 DIAGNOSIS — Y999 Unspecified external cause status: Secondary | ICD-10-CM | POA: Diagnosis not present

## 2019-04-10 DIAGNOSIS — Z87891 Personal history of nicotine dependence: Secondary | ICD-10-CM | POA: Diagnosis not present

## 2019-04-10 DIAGNOSIS — I10 Essential (primary) hypertension: Secondary | ICD-10-CM | POA: Insufficient documentation

## 2019-04-10 DIAGNOSIS — S91114A Laceration without foreign body of right lesser toe(s) without damage to nail, initial encounter: Secondary | ICD-10-CM | POA: Diagnosis not present

## 2019-04-10 DIAGNOSIS — Z23 Encounter for immunization: Secondary | ICD-10-CM | POA: Insufficient documentation

## 2019-04-10 NOTE — ED Triage Notes (Signed)
Pt states that she was cleaning and cut her 4th and 5th toes on and unknown object, bleeding controlled, unknown last tetanus.

## 2019-04-11 ENCOUNTER — Encounter (HOSPITAL_COMMUNITY): Payer: Self-pay | Admitting: Student

## 2019-04-11 ENCOUNTER — Emergency Department (HOSPITAL_COMMUNITY): Payer: 59

## 2019-04-11 MED ORDER — TETANUS-DIPHTH-ACELL PERTUSSIS 5-2.5-18.5 LF-MCG/0.5 IM SUSP
0.5000 mL | Freq: Once | INTRAMUSCULAR | Status: AC
  Administered 2019-04-11: 0.5 mL via INTRAMUSCULAR
  Filled 2019-04-11: qty 0.5

## 2019-04-11 MED ORDER — LIDOCAINE HCL (PF) 1 % IJ SOLN
5.0000 mL | Freq: Once | INTRAMUSCULAR | Status: AC
  Administered 2019-04-11: 5 mL
  Filled 2019-04-11: qty 5

## 2019-04-11 MED ORDER — DOXYCYCLINE HYCLATE 100 MG PO CAPS: 100.0000 mg | ORAL_CAPSULE | Freq: Two times a day (BID) | ORAL | 0 refills | Status: DC

## 2019-04-11 NOTE — ED Provider Notes (Signed)
[facility] DEPART[facility] Provider Note   CSN: [id number] Arrival date & time: 04/10/19  2116     History   Chief Complaint Chief Complaint  Patient presents with   Toe Pain    HPI Dominique Evans is a 53 y.o. female w/ a hx of DM & peripheral neuropathy who presents to the emergency department with complaints of right fourth/fifth toe injury shortly prior to arrival.  Patient states she was walking when she accidentally stepped on an unknown object and cut the right foot.  She states that she was unable to control the bleeding prompting ER visit.  She applied pressure in the waiting room which has seemed to improve this.  Otherwise no alleviating or aggravating factors.  While sitting in the lobby she noticed that the left second toe appeared erythematous, she is unsure if she injured this toe as well.  She has her baseline neuropathy without change in sensation or pain.  Denies fever, chills, acute change in sensation, weakness, or other areas of injury.  Unknown last tetanus.     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 where it was done,  about 6 yrs. 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    Obesity    Panic attacks    Peripheral neuropathy    bilateral feet   PONV (postoperative nausea and vomiting)    Psoriasis    Seasonal allergies    Status post dilation of esophageal narrowing    Unspecified essential hypertension    Visual disturbance     Patient Active Problem List   Diagnosis Date Noted   Esophageal dysmotility 03/01/2015   Colon cancer screening 03/01/2015   Lateral ventral hernia 03/01/2015   S/P lumbar spinal fusion 05/23/2014   Edema 06/28/2013   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   HNP (herniated nucleus pulposus), lumbar 11/30/2012   THORACIC AORTIC ANEURYSM 10/23/2010   CONSTIPATION, CHRONIC 03/23/2010   OBESITY, UNSPECIFIED 10/31/2007   OBSTRUCTIVE SLEEP APNEA 10/31/2007   CHRONIC RHINITIS 10/31/2007   Cough variant asthma 10/31/2007   GERD 10/31/2007   DIABETES MELLITUS 10/04/2007   HYPERTENSION 10/04/2007   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   NEPHRECTOMY, HX OF 10/04/2007    Past Surgical History:  Procedure Laterality Date   BOTOX INJECTION N/A 06/07/2015   Procedure: BOTOX INJECTION;  Surgeon: Inda Castle, MD;  Location: [facility];  Service: Endoscopy;  Laterality: N/A;   COLONOSCOPY WITH PROPOFOL N/A 06/07/2015   Procedure: COLONOSCOPY WITH PROPOFOL;  Surgeon: Louis Meckelobert D Kaplan, MD;  Location: [facility];  Service: Endoscopy;  Laterality: N/A;   ELBOW FRACTURE SURGERY     left   ESOPHAGEAL MANOMETRY N/A 04/08/2015   Procedure: ESOPHAGEAL MANOMETRY (EM);  Surgeon: Louis Meckelobert D Kaplan, MD;  Location: [facility];  Service: Endoscopy;  Laterality: N/A;   ESOPHAGOGASTRODUODENOSCOPY (EGD) WITH PROPOFOL N/A 06/07/2015   Procedure: ESOPHAGOGASTRODUODENOSCOPY (EGD) WITH PROPOFOL;  Surgeon: Louis Meckelobert D Kaplan, MD;  Location: [facility];  Service: Endoscopy;  Laterality: N/A;   GASTRIC ROUX-EN-Y  10/05/2011   Procedure:  LAPAROSCOPIC ROUX-EN-Y GASTRIC;  Surgeon: Mariella SaaBenjamin T Hoxworth, MD;  Location: [facility];  Service: General;  Laterality: N/A;  UPPER ENDOSCOPY   lap band removal      LAPAROSCOPIC GASTRIC BANDING  02/2006   w/ truncal vagotomy   LUMBAR LAMINECTOMY/DECOMPRESSION MICRODISCECTOMY Left 11/30/2012   Procedure: MICRO LUMBAR DECOMPRESSION L4-5 ON THE LEFT;  Surgeon: Javier DockerJeffrey C Beane, MD;  Location: [facility];  Service: Orthopedics;  Laterality: Left;  MICRO LUMBAR DECOMPRESSION L4-5 ON THE LEFT   LUMBAR LAMINECTOMY/DECOMPRESSION MICRODISCECTOMY N/A 03/30/2013   Procedure: REDO MICRO LUMBAR DECOMPRESSION L4-5;  Surgeon: Javier DockerJeffrey C Beane, MD;  Location: [facility];  Service: Orthopedics;  Laterality: N/A;   MAXIMUM ACCESS (MAS)POSTERIOR LUMBAR INTERBODY FUSION (PLIF) 1 LEVEL N/A 05/23/2014   Procedure: FOR MAXIMUM ACCESS (MAS) POSTERIOR LUMBAR INTERBODY FUSION (PLIF) Lumbar Four-Five;  Surgeon: Tia Alertavid S Jones, MD;  Location: [facility];  Service: Neurosurgery;  Laterality: N/A;  FOR MAXIMUM ACCESS (MAS) POSTERIOR LUMBAR INTERBODY FUSION (PLIF) Lumbar Four-Five   NEPHRECTOMY  2002   left   PATENT DUCTUS ARTERIOUS REPAIR     SHOULDER ARTHROSCOPY     right shoulder     OB History   No obstetric history on file.      Home Medications    Prior to Admission medications   Medication Sig Start Date End Date Taking? Authorizing Provider  albuterol (ACCUNEB) 1.25 MG/3ML nebulizer solution Take 3 mLs by nebulization every 4 (four) hours as needed for wheezing or shortness of breath.  11/20/14   [provider]  albuterol (PROVENTIL HFA;VENTOLIN HFA) 108 (90 BASE) MCG/ACT inhaler Inhale 1-2 puffs into the lungs every 4 (four) hours as needed for wheezing or shortness of breath.    [provider]  amitriptyline (ELAVIL) 25 MG tablet Take 50 mg by mouth at bedtime.     [provider]  Apoaequorin (PREVAGEN) 10 MG CAPS Take 10 mg by mouth daily.    [provider]  BAYER CONTOUR  TEST test strip PT CHECKS SUGARS TWICE DAILY DX E11.29 09/23/15   [provider]  CALCIUM-VITAMIN D PO Take 1 tablet by mouth daily.     [provider]  dexlansoprazole (DEXILANT) 60 MG capsule Dexilant 60 mg capsule, delayed release  twice daily    [provider]  ferrous sulfate 325 (65 FE) MG tablet Take 325 mg by mouth daily with breakfast.    [provider]  fexofenadine (ALLEGRA) 180 MG tablet Take 180 mg by mouth daily.    [provider]  Fluticasone-Salmeterol (ADVAIR DISKUS) 250-50 MCG/DOSE AEPB 2 (two) times daily.    [provider]  furosemide (LASIX) 20 MG tablet Take 20 mg by mouth every morning.     [provider]  lansoprazole (PREVACID) 30 MG capsule Take 1 capsule (30 mg total) by mouth 2 (two) times daily  before a meal. Wait at least 30 minutes before eating Patient not taking: Reported on 03/01/2019 09/19/18   Napoleon FormNandigam, Kavitha V, MD  losartan (COZAAR) 50 MG tablet Take 50 mg by mouth every morning.  09/21/11   [provider]  methocarbamol (ROBAXIN) 750 MG tablet Take 750 mg by mouth 3 (three) times daily.    [provider]  montelukast (SINGULAIR) 10 MG tablet Take 10 mg by mouth at bedtime.     [provider]  Multiple Vitamins-Minerals (MULTIVITAMIN PO) Take 1 tablet by mouth daily.     [provider]  oxyCODONE-acetaminophen (PERCOCET/ROXICET) 5-325 MG tablet Take 1 tablet by mouth every 4 (four) hours.     [provider]  polyethylene glycol (MIRALAX) powder Take 17 g by mouth daily.     [provider]  potassium citrate (UROCIT-K) 10 MEQ (1080 MG) SR tablet Take 10 mEq by mouth 2 (two) times daily.     [provider]  pregabalin (LYRICA) 150 MG capsule Take 150 mg by mouth 2 (two) times daily.    [provider]  PRISTIQ 50 MG 24 hr tablet Take 50 mg by mouth daily. 04/20/15   [provider]  rosuvastatin (CRESTOR) 10 MG  tablet Take 5 mg by mouth every morning.     [provider]  Secukinumab, 300 MG Dose, (COSENTYX, 300 MG DOSE,) 150 MG/ML SOSY Inject 300 mg into the skin every 30 (thirty) days.    [provider]  verapamil (COVERA HS) 240 MG (CO) 24 hr tablet Take 240 mg by mouth every morning.     [provider]    Family History Family History  Problem Relation Age of Onset   Hypertension Father    Skin cancer Father    Heart disease Father        before age 260   Heart attack Father    Hyperlipidemia Father    Diabetes Mother    Hypertension Mother    Skin cancer Mother    Breast cancer Mother        Melanoma   Heart disease Mother    Hyperlipidemia Mother    Lung cancer Mother    Brain cancer Mother    Hypertension Sister    Heart disease Sister        before age 55   Heart attack Sister    Hyperlipidemia Sister    Breast cancer Maternal Aunt    Colon cancer Neg Hx    Colon polyps Neg Hx    Esophageal cancer Neg Hx    Gallbladder disease Neg Hx    Stomach cancer Neg Hx     Social History Social History   Tobacco Use   Smoking status: Former Smoker    Packs/day: 2.00    Types: Cigarettes    Quit date: 10/19/1982    Years since quitting: 36.5   Smokeless tobacco: Never Used  Substance Use Topics   Alcohol use: No    Alcohol/week: 0.0 standard drinks   Drug use: No     Allergies   Penicillins, Sulfonamide derivatives, Clindamycin/lincomycin, and Ciprofloxacin   Review of Systems Review of Systems  Constitutional: Negative for chills and fever.  Skin: Positive for color change and wound.  Neurological: Negative for weakness.       Positive for baseline peripheral neuropathy without acute change.     Physical Exam Updated Vital Signs BP 109/79 (BP Location: Right Arm)    Pulse 72  Temp 97.9 F (36.6 C) (Oral)    Resp 16    LMP 12/21/2015    SpO2 100%   Physical Exam Vitals signs and nursing note reviewed.    Constitutional:      General: She is not in acute distress.    Appearance: She is not ill-appearing or toxic-appearing.  HENT:     Head: Normocephalic and atraumatic.  Cardiovascular:     Pulses:          Dorsalis pedis pulses are 2+ on the right side and 2+ on the left side.       Posterior tibial pulses are 2+ on the right side and 2+ on the left side.  Pulmonary:     Effort: Pulmonary effort is normal.  Musculoskeletal:     Comments: Lower extremities: @ the base of the plantar surface of the right fourth toe there is a 2 cm long laceration that is 5 mm in depth.  No active bleeding, no appreciable foreign body, no appreciable tendon involvement.  There is a callus noted to the medial aspect of the left first toe.  No surrounding erythema/warmth or purulent drainage to this area.  There is some mild erythema with swelling & warmth to the touch to the dorsum of the left second toe.  No palpable fluctuance or gross abscess.  No purulent drainage.  Patient has intact active range of motion throughout including to all digits.  No point/focal bony tenderness..   Skin:    General: Skin is warm and dry.     Capillary Refill: Capillary refill takes less than 2 seconds.  Neurological:     Mental Status: She is alert.     Comments: Alert. Clear speech. 5/5 strength with plantar/dorsiflexion bilaterally.   Psychiatric:        Mood and Affect: Mood normal.        Behavior: Behavior normal.      ED Treatments / Results  Labs (all labs ordered are listed, but only abnormal results are displayed) Labs Reviewed - No data to display  EKG    Radiology Dg Toe 5th Right  Result Date: 04/10/2019 CLINICAL DATA:  Initial evaluation for acute trauma, pain. EXAM: RIGHT FIFTH TOE COMPARISON:  None. FINDINGS: No acute fracture dislocation. Joint spaces maintained. Mild diffuse osteopenia. No visible soft tissue injury. IMPRESSION: No acute osseous abnormality about the right fifth toe. Electronically  Signed   By: Rise MuBenjamin  McClintock M.D.   On: 04/10/2019 22:13    Procedures .Marland Kitchen.Laceration Repair  Date/Time: 04/11/2019 6:38 AM Performed by: Cherly AndersonPetrucelli, Shyne Resch R, PA-C Authorized by: Cherly AndersonPetrucelli, Aniza Shor R, PA-C   Consent:    Consent obtained:  Verbal   Consent given by:  Patient   Risks discussed:  Infection, need for additional repair, nerve damage, poor wound healing, poor cosmetic result, pain, retained foreign body, tendon damage and vascular damage   Alternatives discussed:  No treatment Anesthesia (see MAR for exact dosages):    Anesthesia method:  Nerve block   Block location:  R 4th toe   Block needle gauge:  25 G   Block anesthetic:  Lidocaine 1% w/o epi   Block injection procedure:  Anatomic landmarks identified, anatomic landmarks palpated, introduced needle, negative aspiration for blood and incremental injection   Block outcome:  Anesthesia achieved Laceration details:    Location:  Toe   Toe location:  R fourth toe   Length (cm):  2   Depth (mm):  5 Repair type:    Repair  type:  Simple Pre-procedure details:    Preparation:  Patient was prepped and draped in usual sterile fashion Exploration:    Hemostasis achieved with:  Direct pressure   Wound exploration: wound explored through full range of motion and entire depth of wound probed and visualized     Contaminated: no   Treatment:    Area cleansed with:  Betadine   Amount of cleaning:  Extensive   Irrigation solution:  Sterile water   Irrigation method:  Pressure wash Skin repair:    Repair method:  Sutures   Suture size:  4-0   Suture material:  Chromic gut   Suture technique:  Simple interrupted   Number of sutures:  2 Approximation:    Approximation:  Loose Post-procedure details:    Dressing:  Non-adherent dressing   Patient tolerance of procedure:  Tolerated well, no immediate complications   (including critical care time)  Medications Ordered in ED Medications - No data to  display   Initial Impression / Assessment and Plan / ED Course  I have reviewed the triage vital signs and the nursing notes.  Pertinent labs & imaging results that were available during my care of the patient were reviewed by me and considered in my medical decision making (see chart for details).   Patient presents to the emergency department for evaluation of right fourth/fifth toe injury occurred just prior to emergency department arrival.  Patient has been sitting in the ER waiting room for several hours now and is also started to notice some erythema to her left second toe.  She has baseline peripheral neuropathy which is not acutely changed, no complaints of pain at this time. Regarding her right fourth/fifth toes: There is a 2 cm long 5 mm deep laceration to the plantar surface of the base of the fourth toe.   Injury did occur several hours ago & patient is a diabetic, however wound does appear to be gaping and appears to need loose closure for proper healing.  We discussed the risk/benefits/alternatives, ultimately patient elected for closure.  Pressure irrigation performed with extensive cleaning, no evidence of foreign bodies.  Repair per procedure note above, loose closure.  X-ray of the right fifth toe was ordered per triage and negative for acute abnormality, I reviewed the images which are able to somewhat visualize the right fourth toe, no obvious fracture/dislocation, based on mechanism and good range of motion feel that underlying fracture/dislocation is less likely.  Tetanus updated. Regarding her left second toe: X-ray obtained negative for fracture or dislocation, soft tissue swelling noted, there is some concern for infectious process.  No obvious osteomyelitis on x-ray.  No palpable fluctuance/abscess.  Will cover for cellulitis with doxycycline based on her allergies, this will also aid in prevention of infection to her right fourth toe laceration that was repaired in the ER.   She  will need close follow-up with her primary care provider versus podiatrist for reassessment/wound recheck.  Strict ER return precautions discussed. I discussed results, treatment plan, need for follow-up, and return precautions with the patient. Provided opportunity for questions, patient confirmed understanding and is in agreement with plan.   Findings and plan of care discussed with supervising physician Dr. Jeraldine Loots who evaluated patient & is in agreement.   Final Clinical Impressions(s) / ED Diagnoses   Final diagnoses:  Toe infection  Laceration of lesser toe of right foot without foreign body present or damage to nail, initial encounter    ED Discharge Orders  Ordered    doxycycline (VIBRAMYCIN) 100 MG capsule  2 times daily     04/11/19 0651           Cherly Anderson, PA-C 04/11/19 0654    Gerhard Munch, MD 04/14/19 2013

## 2019-04-11 NOTE — Discharge Instructions (Addendum)
You were seen in the emergency department today for complaints related to your toes.  You were seen in the emergency department today for a laceration. Your laceration was closed with 2 stitches which are absorbable.. Please keep this area clean and dry for the next 24 hours, after 24 hours you may get this area wet, but avoid soaking the area. Keep the area covered as best possible especially when in the sun to help in minimizing scarring.   Your tetanus has been updated   X-rays did not show fracture or dislocation.  Your left toe appears as if it may have been infected, we are placing you on doxycycline, an antibiotic to treat this infection.  We have prescribed you new medication(s) today. Discuss the medications prescribed today with your pharmacist as they can have adverse effects and interactions with your other medicines including over the counter and prescribed medications. Seek medical evaluation if you start to experience new or abnormal symptoms after taking one of these medicines, seek care immediately if you start to experience difficulty breathing, feeling of your throat closing, facial swelling, or rash as these could be indications of a more serious allergic reaction  You like you to follow-up with your primary care provider or your podiatrist within the next 48 hours for reassessment r of your toes.  Return to the ER soon should you start to experience pus type drainage from the wound, redness around the wound, or fevers as this could indicate the area is infected, please return to the ER for any other worsening symptoms or concerns that you may have.

## 2019-04-11 NOTE — ED Notes (Signed)
Patient verbalizes understanding of discharge instructions. Opportunity for questioning and answers were provided. Armband removed by staff, pt discharged from ED home via POV.  

## 2019-04-11 NOTE — ED Notes (Signed)
Pt reports she smashed her toe and lacerated same. Pt reports being diabetic and unable to feel same. Pt reports she is unable to get bleeding stopped.

## 2019-04-26 ENCOUNTER — Other Ambulatory Visit: Payer: Self-pay

## 2019-04-26 ENCOUNTER — Ambulatory Visit (INDEPENDENT_AMBULATORY_CARE_PROVIDER_SITE_OTHER): Payer: 59 | Admitting: Podiatry

## 2019-04-26 VITALS — Temp 97.3°F

## 2019-04-26 DIAGNOSIS — L03032 Cellulitis of left toe: Secondary | ICD-10-CM

## 2019-04-26 DIAGNOSIS — E0843 Diabetes mellitus due to underlying condition with diabetic autonomic (poly)neuropathy: Secondary | ICD-10-CM

## 2019-04-26 DIAGNOSIS — L989 Disorder of the skin and subcutaneous tissue, unspecified: Secondary | ICD-10-CM | POA: Diagnosis not present

## 2019-04-26 DIAGNOSIS — L97522 Non-pressure chronic ulcer of other part of left foot with fat layer exposed: Secondary | ICD-10-CM | POA: Diagnosis not present

## 2019-04-26 NOTE — Progress Notes (Signed)
HPI: 53 year old female presents the office today as a new patient for evaluation of bilateral foot issues.  Patient states that she is diabetic with neuropathy and loss of sensation to her feet.  She has a history of lacerations to her right toes that have healed uneventfully. She also states that recently she went to another physician for evaluation of a infection to the left second toe and she states that her left second toe was cut during the visit.  She is currently taking doxycycline for the cellulitis.  She presents today for follow-up treatment and evaluation as she was dissatisfied with the previous physician who initially treated her  Past Medical History:  Diagnosis Date  . Allergic rhinitis   . Anemia   . Arthritis    spondylolisthesis- lumbar region  . Asthma    PFT 08/12/07: FEV1 2.97 (104%), FEV1% 84, TLC 4.89 (90%), DLCO 84%, +BD  . Blood transfusion    hx of 2002   . Blood transfusion without reported diagnosis    kidney, no issues  . Chronic constipation   . 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 Cough 03/23/13  . Depression   . Diabetes mellitus    pre gastric bypass  . Esophageal dysmotility 03/01/2015  . GERD (gastroesophageal reflux disease)   . H/O hiatal hernia   . Head injury due to trauma    age 50, went under car while sledding, had memory loss at that time  . 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 History of stress test    exercise stress test, ? where it was done,  about 6 yrs. ago,  results- negative   . Hydatidiform mole   . Hyperlipidemia   . Hypertension   . 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 Obesity   . Panic attacks   . Peripheral neuropathy    bilateral feet  . PONV (postoperative nausea and vomiting)   . Psoriasis   . Seasonal allergies   . Status post dilation of esophageal narrowing   . Unspecified essential hypertension   . Visual disturbance      Physical  Exam: General: The patient is alert and oriented x3 in no acute distress.  Dermatology: Skin is warm, dry and supple bilateral lower extremities. Negative for open lesions or macerations.  Hyperkeratotic pre-ulcerative callus lesions noted to the bilateral feet x3.  No open wounds noted, with exception of an ulceration noted to the plantar aspect of the left great toe measuring approximately 0.5 x 0.5 x 0.2 cm.  To the noted ulceration there is no eschar.  There is a moderate amount of slough and necrotic tissue noted.  No malodor noted.  Minimal serous drainage noted.  Periwound integrity is callused.  Vascular: Palpable pedal pulses bilaterally. No edema or erythema noted. Capillary refill within normal limits.  There is some mild erythema to the left second digit that appears to be significantly improved based on a prior picture that the patient showed to me during her visit today.  Neurological: Epicritic and protective threshold diminished bilaterally.   Musculoskeletal Exam: Range of motion within normal limits to all pedal and ankle joints bilateral. Muscle strength 5/5 in all groups bilateral.   Assessment: 1.  Ulcer left great toe secondary to diabetes mellitus 2.  Pre-ulcerative callus lesions bilateral x3 3.  Cellulitis left second toe-improving   Plan of Care:  1. Patient evaluated. 2.  Medically necessary excisional debridement including subcutaneous tissue was performed using a chisel blade and tissue nipper.  Excisional debridement of all necrotic nonviable tissue down to healthy bleeding viable tissue was performed with post debridement measurement same as pre- 3.  Excisional debridement of the hyperkeratotic callus lesions was performed using a tissue nipper.  No bleeding noted during debridement. 4.  Antibiotic ointment a Band-Aid was applied to the left great toe ulceration.  Patient states that she was prescribed antibiotic ointment to apply to the wound.  Continue antibiotic  ointment and a Band-Aid daily 5.  Continue oral antibiotics as prescribed for the cellulitis to the left second toe 6.  The patient admits to walking barefoot frequently.  I explained the patient that walking barefoot being diabetic with neuropathy can be very detrimental to her health and increase the risk of ulceration and subsequent possible amputation.  Recommend good supportive shoes daily 7.  Return to clinic as needed      Felecia ShellingBrent M. Tenesia Escudero, DPM [facility]  Dr. Felecia ShellingBrent M. Marsha Hillman, DPM    [address]                                        [place], [place] [address]                Office [phone]  Fax [phone]

## 2019-04-27 ENCOUNTER — Other Ambulatory Visit: Payer: Self-pay

## 2019-04-27 ENCOUNTER — Encounter (HOSPITAL_COMMUNITY): Payer: Self-pay

## 2019-04-27 ENCOUNTER — Inpatient Hospital Stay (HOSPITAL_COMMUNITY)
Admission: EM | Admit: 2019-04-27 | Discharge: 2019-05-01 | DRG: 603 | Disposition: A | Discharge status: Home / Self Care | Payer: No Typology Code available for payment source | Attending: Internal Medicine | Admitting: Internal Medicine

## 2019-04-27 ENCOUNTER — Emergency Department (HOSPITAL_COMMUNITY): Payer: No Typology Code available for payment source

## 2019-04-27 DIAGNOSIS — M1990 Unspecified osteoarthritis, unspecified site: Secondary | ICD-10-CM | POA: Diagnosis present

## 2019-04-27 DIAGNOSIS — L03032 Cellulitis of left toe: Principal | ICD-10-CM | POA: Diagnosis present

## 2019-04-27 DIAGNOSIS — R3915 Urgency of urination: Secondary | ICD-10-CM | POA: Diagnosis present

## 2019-04-27 DIAGNOSIS — I152 Hypertension secondary to endocrine disorders: Secondary | ICD-10-CM | POA: Diagnosis present

## 2019-04-27 DIAGNOSIS — F329 Major depressive disorder, single episode, unspecified: Secondary | ICD-10-CM | POA: Diagnosis present

## 2019-04-27 DIAGNOSIS — R32 Unspecified urinary incontinence: Secondary | ICD-10-CM | POA: Diagnosis present

## 2019-04-27 DIAGNOSIS — L409 Psoriasis, unspecified: Secondary | ICD-10-CM | POA: Diagnosis present

## 2019-04-27 DIAGNOSIS — Z9884 Bariatric surgery status: Secondary | ICD-10-CM

## 2019-04-27 DIAGNOSIS — Z833 Family history of diabetes mellitus: Secondary | ICD-10-CM

## 2019-04-27 DIAGNOSIS — K5909 Other constipation: Secondary | ICD-10-CM | POA: Diagnosis present

## 2019-04-27 DIAGNOSIS — R7982 Elevated C-reactive protein (CRP): Secondary | ICD-10-CM | POA: Diagnosis present

## 2019-04-27 DIAGNOSIS — F410 Panic disorder [episodic paroxysmal anxiety] without agoraphobia: Secondary | ICD-10-CM | POA: Diagnosis present

## 2019-04-27 DIAGNOSIS — R509 Fever, unspecified: Secondary | ICD-10-CM

## 2019-04-27 DIAGNOSIS — L97529 Non-pressure chronic ulcer of other part of left foot with unspecified severity: Secondary | ICD-10-CM | POA: Diagnosis present

## 2019-04-27 DIAGNOSIS — Z7951 Long term (current) use of inhaled steroids: Secondary | ICD-10-CM

## 2019-04-27 DIAGNOSIS — E1140 Type 2 diabetes mellitus with diabetic neuropathy, unspecified: Secondary | ICD-10-CM | POA: Diagnosis present

## 2019-04-27 DIAGNOSIS — Z79899 Other long term (current) drug therapy: Secondary | ICD-10-CM

## 2019-04-27 DIAGNOSIS — Z79891 Long term (current) use of opiate analgesic: Secondary | ICD-10-CM

## 2019-04-27 DIAGNOSIS — Z880 Allergy status to penicillin: Secondary | ICD-10-CM

## 2019-04-27 DIAGNOSIS — L089 Local infection of the skin and subcutaneous tissue, unspecified: Secondary | ICD-10-CM

## 2019-04-27 DIAGNOSIS — E1169 Type 2 diabetes mellitus with other specified complication: Secondary | ICD-10-CM | POA: Diagnosis present

## 2019-04-27 DIAGNOSIS — Z87442 Personal history of urinary calculi: Secondary | ICD-10-CM

## 2019-04-27 DIAGNOSIS — E11621 Type 2 diabetes mellitus with foot ulcer: Secondary | ICD-10-CM | POA: Diagnosis present

## 2019-04-27 DIAGNOSIS — E785 Hyperlipidemia, unspecified: Secondary | ICD-10-CM | POA: Diagnosis present

## 2019-04-27 DIAGNOSIS — M545 Low back pain: Secondary | ICD-10-CM | POA: Diagnosis present

## 2019-04-27 DIAGNOSIS — Z881 Allergy status to other antibiotic agents status: Secondary | ICD-10-CM

## 2019-04-27 DIAGNOSIS — E1159 Type 2 diabetes mellitus with other circulatory complications: Secondary | ICD-10-CM | POA: Diagnosis present

## 2019-04-27 DIAGNOSIS — L03116 Cellulitis of left lower limb: Secondary | ICD-10-CM | POA: Diagnosis present

## 2019-04-27 DIAGNOSIS — G8929 Other chronic pain: Secondary | ICD-10-CM | POA: Diagnosis present

## 2019-04-27 DIAGNOSIS — J45909 Unspecified asthma, uncomplicated: Secondary | ICD-10-CM | POA: Diagnosis present

## 2019-04-27 DIAGNOSIS — Z87891 Personal history of nicotine dependence: Secondary | ICD-10-CM

## 2019-04-27 DIAGNOSIS — Z8249 Family history of ischemic heart disease and other diseases of the circulatory system: Secondary | ICD-10-CM

## 2019-04-27 DIAGNOSIS — Z6832 Body mass index (BMI) 32.0-32.9, adult: Secondary | ICD-10-CM

## 2019-04-27 DIAGNOSIS — Z882 Allergy status to sulfonamides status: Secondary | ICD-10-CM

## 2019-04-27 DIAGNOSIS — K219 Gastro-esophageal reflux disease without esophagitis: Secondary | ICD-10-CM | POA: Diagnosis present

## 2019-04-27 DIAGNOSIS — E669 Obesity, unspecified: Secondary | ICD-10-CM | POA: Diagnosis present

## 2019-04-27 DIAGNOSIS — Z1159 Encounter for screening for other viral diseases: Secondary | ICD-10-CM

## 2019-04-27 DIAGNOSIS — Z8349 Family history of other endocrine, nutritional and metabolic diseases: Secondary | ICD-10-CM

## 2019-04-27 LAB — CBC WITH DIFFERENTIAL/PLATELET
Abs Immature Granulocytes: 0.02 10*3/uL (ref 0.00–0.07)
Basophils Absolute: 0 10*3/uL (ref 0.0–0.1)
Basophils Relative: 0 %
Eosinophils Absolute: 0 10*3/uL (ref 0.0–0.5)
Eosinophils Relative: 0 %
HCT: 34.8 % — ABNORMAL LOW (ref 36.0–46.0)
Hemoglobin: 11.1 g/dL — ABNORMAL LOW (ref 12.0–15.0)
Immature Granulocytes: 0 %
Lymphocytes Relative: 8 %
Lymphs Abs: 0.6 10*3/uL — ABNORMAL LOW (ref 0.7–4.0)
MCH: 27.3 pg (ref 26.0–34.0)
MCHC: 31.9 g/dL (ref 30.0–36.0)
MCV: 85.7 fL (ref 80.0–100.0)
Monocytes Absolute: 0.4 10*3/uL (ref 0.1–1.0)
Monocytes Relative: 6 %
Neutro Abs: 6.5 10*3/uL (ref 1.7–7.7)
Neutrophils Relative %: 86 %
Platelets: 181 10*3/uL (ref 150–400)
RBC: 4.06 MIL/uL (ref 3.87–5.11)
RDW: 13.8 % (ref 11.5–15.5)
WBC: 7.6 10*3/uL (ref 4.0–10.5)
nRBC: 0 % (ref 0.0–0.2)

## 2019-04-27 LAB — URINALYSIS, ROUTINE W REFLEX MICROSCOPIC
Bilirubin Urine: NEGATIVE
Glucose, UA: NEGATIVE mg/dL
Hgb urine dipstick: NEGATIVE
Ketones, ur: NEGATIVE mg/dL
Leukocytes,Ua: NEGATIVE
Nitrite: NEGATIVE
Protein, ur: NEGATIVE mg/dL
Specific Gravity, Urine: 1.008 (ref 1.005–1.030)
pH: 7 (ref 5.0–8.0)

## 2019-04-27 LAB — SARS CORONAVIRUS 2 BY RT PCR (HOSPITAL ORDER, PERFORMED IN ~~LOC~~ HOSPITAL LAB): SARS Coronavirus 2: NEGATIVE

## 2019-04-27 LAB — COMPREHENSIVE METABOLIC PANEL
ALT: 22 U/L (ref 0–44)
AST: 26 U/L (ref 15–41)
Albumin: 3.6 g/dL (ref 3.5–5.0)
Alkaline Phosphatase: 87 U/L (ref 38–126)
Anion gap: 11 (ref 5–15)
BUN: 22 mg/dL — ABNORMAL HIGH (ref 6–20)
CO2: 23 mmol/L (ref 22–32)
Calcium: 8.9 mg/dL (ref 8.9–10.3)
Chloride: 104 mmol/L (ref 98–111)
Creatinine, Ser: 0.94 mg/dL (ref 0.44–1.00)
GFR calc Af Amer: >60 mL/min (ref >60)
GFR calc non Af Amer: >60 mL/min (ref >60)
Glucose, Bld: 135 mg/dL — ABNORMAL HIGH (ref 70–99)
Potassium: 3.6 mmol/L (ref 3.5–5.1)
Sodium: 138 mmol/L (ref 135–145)
Total Bilirubin: 0.6 mg/dL (ref 0.3–1.2)
Total Protein: 7 g/dL (ref 6.5–8.1)

## 2019-04-27 LAB — CREATININE, SERUM
Creatinine, Ser: 0.76 mg/dL (ref 0.44–1.00)
GFR calc Af Amer: >60 mL/min (ref >60)
GFR calc non Af Amer: >60 mL/min (ref >60)

## 2019-04-27 LAB — MRSA PCR SCREENING: MRSA by PCR: NEGATIVE

## 2019-04-27 LAB — CBC
HCT: 32.6 % — ABNORMAL LOW (ref 36.0–46.0)
Hemoglobin: 10.5 g/dL — ABNORMAL LOW (ref 12.0–15.0)
MCH: 27.6 pg (ref 26.0–34.0)
MCHC: 32.2 g/dL (ref 30.0–36.0)
MCV: 85.6 fL (ref 80.0–100.0)
Platelets: 178 10*3/uL (ref 150–400)
RBC: 3.81 MIL/uL — ABNORMAL LOW (ref 3.87–5.11)
RDW: 14.2 % (ref 11.5–15.5)
WBC: 6.8 10*3/uL (ref 4.0–10.5)
nRBC: 0 % (ref 0.0–0.2)

## 2019-04-27 LAB — I-STAT BETA HCG BLOOD, ED (MC, WL, AP ONLY): I-stat hCG, quantitative: <5 m[IU]/mL (ref <5)

## 2019-04-27 LAB — APTT: aPTT: 30 seconds (ref 24–36)

## 2019-04-27 LAB — LACTIC ACID, PLASMA
Lactic Acid, Venous: 1.5 mmol/L (ref 0.5–1.9)
Lactic Acid, Venous: 1.1 mmol/L (ref 0.5–1.9)

## 2019-04-27 LAB — C-REACTIVE PROTEIN: CRP: 3.2 mg/dL — ABNORMAL HIGH (ref <1.0)

## 2019-04-27 LAB — PROTIME-INR
INR: 1.1 (ref 0.8–1.2)
Prothrombin Time: 13.9 seconds (ref 11.4–15.2)

## 2019-04-27 LAB — GLUCOSE, CAPILLARY: Glucose-Capillary: 91 mg/dL (ref 70–99)

## 2019-04-27 MED ORDER — SODIUM CHLORIDE 0.9 % IV SOLN
INTRAVENOUS | Status: DC
  Administered 2019-04-27: 21:00:00 via INTRAVENOUS

## 2019-04-27 MED ORDER — PREGABALIN 25 MG PO CAPS
150.0000 mg | ORAL_CAPSULE | Freq: Two times a day (BID) | ORAL | Status: DC
  Administered 2019-04-27 – 2019-05-01 (×8): 150 mg via ORAL
  Filled 2019-04-27 (×8): qty 2

## 2019-04-27 MED ORDER — ALBUTEROL SULFATE HFA 108 (90 BASE) MCG/ACT IN AERS: 1.0000 | INHALATION_SPRAY | RESPIRATORY_TRACT | Status: DC | PRN

## 2019-04-27 MED ORDER — ACETAMINOPHEN 650 MG RE SUPP: 650.0000 mg | Freq: Four times a day (QID) | RECTAL | Status: DC | PRN

## 2019-04-27 MED ORDER — AMITRIPTYLINE HCL 50 MG PO TABS
50.0000 mg | ORAL_TABLET | Freq: Every day | ORAL | Status: DC
  Administered 2019-04-27 – 2019-04-30 (×4): 50 mg via ORAL
  Filled 2019-04-27 (×4): qty 1

## 2019-04-27 MED ORDER — OXYCODONE-ACETAMINOPHEN 5-325 MG PO TABS
1.0000 | ORAL_TABLET | Freq: Four times a day (QID) | ORAL | Status: DC | PRN
  Administered 2019-04-27 – 2019-04-28 (×2): 1 via ORAL
  Filled 2019-04-27 (×2): qty 1

## 2019-04-27 MED ORDER — VENLAFAXINE HCL ER 75 MG PO CP24
75.0000 mg | ORAL_CAPSULE | Freq: Every day | ORAL | Status: DC
  Administered 2019-04-28 – 2019-05-01 (×4): 75 mg via ORAL
  Filled 2019-04-27 (×4): qty 1

## 2019-04-27 MED ORDER — ACETAMINOPHEN 325 MG PO TABS
650.0000 mg | ORAL_TABLET | Freq: Four times a day (QID) | ORAL | Status: DC | PRN
  Administered 2019-04-27 – 2019-04-28 (×3): 650 mg via ORAL
  Filled 2019-04-27 (×3): qty 2

## 2019-04-27 MED ORDER — VANCOMYCIN HCL 10 G IV SOLR
1250.0000 mg | INTRAVENOUS | Status: DC
  Administered 2019-04-28 – 2019-04-30 (×3): 1250 mg via INTRAVENOUS
  Filled 2019-04-27 (×4): qty 1250

## 2019-04-27 MED ORDER — MOMETASONE FURO-FORMOTEROL FUM 200-5 MCG/ACT IN AERO
2.0000 | INHALATION_SPRAY | Freq: Two times a day (BID) | RESPIRATORY_TRACT | Status: DC
  Administered 2019-04-28 – 2019-05-01 (×6): 2 via RESPIRATORY_TRACT
  Filled 2019-04-27: qty 8.8

## 2019-04-27 MED ORDER — LOSARTAN POTASSIUM 50 MG PO TABS: 50.0000 mg | ORAL_TABLET | Freq: Every morning | ORAL | Status: DC

## 2019-04-27 MED ORDER — SODIUM CHLORIDE 0.9 % IV BOLUS
1000.0000 mL | Freq: Once | INTRAVENOUS | Status: AC
  Administered 2019-04-27: 1000 mL via INTRAVENOUS

## 2019-04-27 MED ORDER — SODIUM CHLORIDE 0.9 % IV SOLN
1.0000 g | Freq: Three times a day (TID) | INTRAVENOUS | Status: DC
  Administered 2019-04-28 – 2019-05-01 (×11): 1 g via INTRAVENOUS
  Filled 2019-04-27 (×12): qty 1

## 2019-04-27 MED ORDER — ONDANSETRON HCL 4 MG PO TABS: 4.0000 mg | ORAL_TABLET | Freq: Four times a day (QID) | ORAL | Status: DC | PRN

## 2019-04-27 MED ORDER — ONDANSETRON HCL 4 MG/2ML IJ SOLN: 4.0000 mg | Freq: Four times a day (QID) | INTRAMUSCULAR | Status: DC | PRN

## 2019-04-27 MED ORDER — HEPARIN SODIUM (PORCINE) 5000 UNIT/ML IJ SOLN
5000.0000 [IU] | Freq: Three times a day (TID) | INTRAMUSCULAR | Status: DC
  Administered 2019-04-27 – 2019-05-01 (×11): 5000 [IU] via SUBCUTANEOUS
  Filled 2019-04-27 (×11): qty 1

## 2019-04-27 MED ORDER — MONTELUKAST SODIUM 10 MG PO TABS
10.0000 mg | ORAL_TABLET | Freq: Every day | ORAL | Status: DC
  Administered 2019-04-27 – 2019-04-30 (×4): 10 mg via ORAL
  Filled 2019-04-27 (×5): qty 1

## 2019-04-27 MED ORDER — ALBUTEROL SULFATE (2.5 MG/3ML) 0.083% IN NEBU
3.0000 mL | INHALATION_SOLUTION | RESPIRATORY_TRACT | Status: DC | PRN
  Filled 2019-04-27: qty 3

## 2019-04-27 MED ORDER — VERAPAMIL HCL 240 MG (CO) PO TB24: 240.0000 mg | ORAL_TABLET | Freq: Every morning | ORAL | Status: DC

## 2019-04-27 MED ORDER — VANCOMYCIN HCL 10 G IV SOLR
1750.0000 mg | Freq: Once | INTRAVENOUS | Status: AC
  Administered 2019-04-27: 1750 mg via INTRAVENOUS
  Filled 2019-04-27: qty 1750

## 2019-04-27 MED ORDER — VANCOMYCIN HCL IN DEXTROSE 1-5 GM/200ML-% IV SOLN: 1000.0000 mg | Freq: Once | INTRAVENOUS | Status: DC

## 2019-04-27 MED ORDER — PANTOPRAZOLE SODIUM 40 MG PO TBEC
40.0000 mg | DELAYED_RELEASE_TABLET | Freq: Every day | ORAL | Status: DC
  Administered 2019-04-28 – 2019-05-01 (×4): 40 mg via ORAL
  Filled 2019-04-27 (×5): qty 1

## 2019-04-27 MED ORDER — METRONIDAZOLE IN NACL 5-0.79 MG/ML-% IV SOLN
500.0000 mg | Freq: Once | INTRAVENOUS | Status: AC
  Administered 2019-04-27: 500 mg via INTRAVENOUS
  Filled 2019-04-27: qty 100

## 2019-04-27 MED ORDER — ROSUVASTATIN CALCIUM 5 MG PO TABS
5.0000 mg | ORAL_TABLET | Freq: Every morning | ORAL | Status: DC
  Administered 2019-04-28 – 2019-05-01 (×4): 5 mg via ORAL
  Filled 2019-04-27 (×5): qty 1

## 2019-04-27 MED ORDER — SODIUM CHLORIDE 0.9 % IV SOLN
2.0000 g | Freq: Once | INTRAVENOUS | Status: AC
  Administered 2019-04-27: 2 g via INTRAVENOUS
  Filled 2019-04-27: qty 2

## 2019-04-27 MED ORDER — INSULIN ASPART 100 UNIT/ML ~~LOC~~ SOLN: 0.0000 [IU] | Freq: Three times a day (TID) | SUBCUTANEOUS | Status: DC

## 2019-04-27 NOTE — ED Notes (Signed)
ED TO INPATIENT HANDOFF REPORT  ED Nurse Name and Phone #: Zemira Zehring [phone]  S Name/Age/Gender Halina MaidensSusan Morawski 53 y.o. female Room/Bed: 045C/045C  Code Status   Code Status: Prior  Home/SNF/Other Home Patient oriented to: self, place and situation Is this baseline? Yes   Triage Complete: Triage complete  Chief Complaint cp/sepsis  Triage Note Patient BIB by [facility] with complaints of chest pain and body-aches. Patient heart rate 119 and temp: 100.2 on arrival as well. Patient has been treated for MRSA x2 weeks and went to have a toe debridement done a day ago.   Allergies Allergies  Allergen Reactions  . Penicillins Anaphylaxis    Has patient had a PCN reaction causing immediate rash, facial/tongue/throat swelling, SOB or lightheadedness with hypotension: yes Has patient had a PCN reaction causing severe rash involving mucus membranes or skin necrosis: unknown Has patient had a PCN reaction that required hospitalization unknown Has patient had a PCN reaction occurring within the last 10 years: no If all of the above answers are "NO", then may proceed with Cephalosporin use.   . Sulfonamide Derivatives Anaphylaxis  . Clindamycin/Lincomycin Nausea And Vomiting  . Ciprofloxacin Hives    Level of Care/Admitting Diagnosis ED Disposition    ED Disposition Condition Comment   Admit  [facility] [[id number]]  Level of Care: Med-Surg [16]  I expect the patient will be discharged within 24 hours: No (not a candidate for 5C-Observation unit)  Covid Evaluation: Confirmed COVID Negative  Diagnosis: Cellulitis of left foot [[id number]]  Admitting Physician: Charlsie QuestEL, VISHAL R [[id number]]  Attending Physician: Charlsie QuestPATEL, VISHAL R [[id number]]  PT Class (Do Not Modify): Observation [104]  PT Acc Code (Do Not Modify): Observation [10022]       B Medical/Surgery History Past Medical History:  Diagnosis Date  . Allergic rhinitis   . Anemia   . Arthritis     spondylolisthesis- lumbar region  . Asthma    PFT 08/12/07: FEV1 2.97 (104%), FEV1% 84, TLC 4.89 (90%), DLCO 84%, +BD  . Blood transfusion    hx of 2002   . Blood transfusion without reported diagnosis    kidney, no issues  . Chronic constipation   . 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 Cough 03/23/13  . Depression   . Diabetes mellitus    pre gastric bypass  . Esophageal dysmotility 03/01/2015  . GERD (gastroesophageal reflux disease)   . H/O hiatal hernia   . Head injury due to trauma    age 89, went under car while sledding, had memory loss at that time  . 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 History of stress test    exercise stress test, ? where it was done,  about 6 yrs. ago,  results- negative   . Hydatidiform mole   . Hyperlipidemia   . Hypertension   . 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 Obesity   . Panic attacks   . Peripheral neuropathy    bilateral feet  . PONV (postoperative nausea and vomiting)   . Psoriasis   . Seasonal allergies   . Status post dilation of esophageal narrowing   . Unspecified essential hypertension   . Visual disturbance    Past Surgical History:  Procedure Laterality Date  . BOTOX INJECTION N/A 06/07/2015   Procedure: BOTOX INJECTION;  Surgeon: Louis Meckelobert D Kaplan, MD;  [facility];  Service: Endoscopy;  Laterality: N/A;  . COLONOSCOPY WITH PROPOFOL N/A 06/07/2015   Procedure: COLONOSCOPY WITH PROPOFOL;  Surgeon: Louis Meckelobert D Kaplan, MD;  Location: [facility];  Service: Endoscopy;  Laterality: N/A;  . ELBOW FRACTURE SURGERY     left  . ESOPHAGEAL MANOMETRY N/A 04/08/2015   Procedure: ESOPHAGEAL MANOMETRY (EM);  Surgeon: Louis Meckelobert D Kaplan, MD;  Location: [facility]OS[facility];  Service: Endoscopy;  Laterality: N/A;  . ESOPHAGOGASTRODUODENOSCOPY (EGD) WITH PROPOFOL N/A 06/07/2015   Procedure: ESOPHAGOGASTRODUODENOSCOPY (EGD) WITH PROPOFOL;  Surgeon: Louis Meckelobert D Kaplan, MD;   Location: [facility];  Service: Endoscopy;  Laterality: N/A;  . 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 lap band removal     . LAPAROSCOPIC GASTRIC BANDING  02/2006   w/ truncal vagotomy  . LUMBAR LAMINECTOMY/DECOMPRESSION MICRODISCECTOMY Left 11/30/2012   Procedure: MICRO LUMBAR DECOMPRESSION L4-5 ON THE LEFT;  Surgeon: Javier DockerJeffrey C Beane, MD;  Location: [facility];  Service: Orthopedics;  Laterality: Left;  MICRO LUMBAR DECOMPRESSION L4-5 ON THE LEFT  . LUMBAR LAMINECTOMY/DECOMPRESSION MICRODISCECTOMY N/A 03/30/2013   Procedure: REDO MICRO LUMBAR DECOMPRESSION L4-5;  Surgeon: Javier DockerJeffrey C Beane, MD;  Location: [facility];  Service: Orthopedics;  Laterality: N/A;  . MAXIMUM ACCESS (MAS)POSTERIOR LUMBAR INTERBODY FUSION (PLIF) 1 LEVEL N/A 05/23/2014   Procedure: FOR MAXIMUM ACCESS (MAS) POSTERIOR LUMBAR INTERBODY FUSION (PLIF) Lumbar Four-Five;  Surgeon: Tia Alertavid S Jones, MD;  [facility];  Service: Neurosurgery;  Laterality: N/A;  FOR MAXIMUM ACCESS (MAS) POSTERIOR LUMBAR INTERBODY FUSION (PLIF) Lumbar Four-Five  . NEPHRECTOMY  2002   left  . PATENT DUCTUS ARTERIOUS REPAIR    . SHOULDER ARTHROSCOPY     right shoulder     A IV Location/Drains/Wounds Patient Lines/Drains/Airways Status   Active Line/Drains/Airways    Name:   Placement date:   Placement time:   Site:   Days:   Peripheral IV 04/27/19 Anterior;Left Hand   04/27/19    1600    Hand   less than 1   Peripheral IV 04/27/19 Right Forearm   04/27/19    1606    Forearm   less than 1          Intake/Output Last 24 hours  Intake/Output Summary (Last 24 hours) at 04/27/2019 1932 Last data filed at 04/27/2019 1845 Gross per 24 hour  Intake 1800 ml  Output -  Net 1800 ml    Labs/Imaging Results for orders placed or performed during the hospital encounter of 04/27/19 (from the past 48 hour(s))   Lactic acid, plasma     Status: None   Collection Time: 04/27/19  4:22 PM  Result Value Ref Range   Lactic Acid, Venous 1.5 0.5 - 1.9 mmol/L    Comment: Performed at [facility], [address] [place], [place] [address]  Comprehensive metabolic panel     Status: Abnormal   Collection Time: 04/27/19  4:22 PM  Result Value Ref Range   Sodium 138 135 - 145 mmol/L   Potassium 3.6 3.5 - 5.1 mmol/L   Chloride 104 98 - 111 mmol/L   CO2 23 22 - 32 mmol/L   Glucose, Bld 135 (H) 70 - 99 mg/dL   BUN 22 (H) 6 - 20 mg/dL   Creatinine, Ser 4.400.94 0.44 - 1.00 mg/dL   Calcium 8.9 8.9 - 10.210.3 mg/dL   Total Protein 7.0  6.5 - 8.1 g/dL   Albumin 3.6 3.5 - 5.0 g/dL   AST 26 15 - 41 U/L   ALT 22 0 - 44 U/L   Alkaline Phosphatase 87 38 - 126 U/L   Total Bilirubin 0.6 0.3 - 1.2 mg/dL   GFR calc non Af Amer >60 >60 mL/min   GFR calc Af Amer >60 >60 mL/min   Anion gap 11 5 - 15    Comment: Performed at [facility], [address] [place], [place] [address]  CBC WITH DIFFERENTIAL     Status: Abnormal   Collection Time: 04/27/19  4:22 PM  Result Value Ref Range   WBC 7.6 4.0 - 10.5 K/uL   RBC 4.06 3.87 - 5.11 MIL/uL   Hemoglobin 11.1 (L) 12.0 - 15.0 g/dL   HCT 40.934.8 (L) 81.136.0 - 91.446.0 %   MCV 85.7 80.0 - 100.0 fL   MCH 27.3 26.0 - 34.0 pg   MCHC 31.9 30.0 - 36.0 g/dL   RDW 78.213.8 95.611.5 - 21.315.5 %   Platelets 181 150 - 400 K/uL   nRBC 0.0 0.0 - 0.2 %   Neutrophils Relative % 86 %   Neutro Abs 6.5 1.7 - 7.7 K/uL   Lymphocytes Relative 8 %   Lymphs Abs 0.6 (L) 0.7 - 4.0 K/uL   Monocytes Relative 6 %   Monocytes Absolute 0.4 0.1 - 1.0 K/uL   Eosinophils Relative 0 %   Eosinophils Absolute 0.0 0.0 - 0.5 K/uL   Basophils Relative 0 %   Basophils Absolute 0.0 0.0 - 0.1 K/uL   Immature Granulocytes 0 %   Abs Immature Granulocytes 0.02 0.00 - 0.07 K/uL    Comment: Performed at [facility], [address] [place], [place] [address]  APTT     Status: None   Collection Time: 04/27/19   4:22 PM  Result Value Ref Range   aPTT 30 24 - 36 seconds    Comment: Performed at [facility], [address] [place], [place] [address]  Protime-INR     Status: None   Collection Time: 04/27/19  4:22 PM  Result Value Ref Range   Prothrombin Time 13.9 11.4 - 15.2 seconds   INR 1.1 0.8 - 1.2    Comment: (NOTE) INR goal varies based on device and disease states. Performed at [facility], [address] [place], [place] [address]   SARS Coronavirus 2 (CEPHEID- Performed in [facility] hospital lab), Hosp Order     Status: None   Collection Time: 04/27/19  4:24 PM   Specimen: Nasopharyngeal Swab  Result Value Ref Range   SARS Coronavirus 2 NEGATIVE NEGATIVE    Comment: (NOTE) If result is NEGATIVE SARS-CoV-2 target nucleic acids are NOT DETECTED. The SARS-CoV-2 RNA is generally detectable in upper and lower  respiratory specimens during the acute phase of infection. The lowest  concentration of SARS-CoV-2 viral copies this assay can detect is 250  copies / mL. A negative result does not preclude SARS-CoV-2 infection  and should not be used as the sole basis for treatment or other  patient management decisions.  A negative result may occur with  improper specimen collection / handling, submission of specimen other  than nasopharyngeal swab, presence of viral mutation(s) within the  areas targeted by this assay, and inadequate number of viral copies  (<250 copies / mL). A negative result must be combined with clinical  observations, patient history, and epidemiological information. If  result is POSITIVE SARS-CoV-2 target nucleic acids are DETECTED. The SARS-CoV-2 RNA is generally detectable in upper and lower  respiratory specimens dur ing the acute phase of infection.  Positive  results are indicative of active infection with SARS-CoV-2.  Clinical  correlation with patient history and other diagnostic information is  necessary to determine patient infection status.   Positive results do  not rule out bacterial infection or co-infection with other viruses. If result is PRESUMPTIVE POSTIVE SARS-CoV-2 nucleic acids MAY BE PRESENT.   A presumptive positive result was obtained on the submitted specimen  and confirmed on repeat testing.  While 2019 novel coronavirus  (SARS-CoV-2) nucleic acids may be present in the submitted sample  additional confirmatory testing may be necessary for epidemiological  and / or clinical management purposes  to differentiate between  SARS-CoV-2 and other Sarbecovirus currently known to infect humans.  If clinically indicated additional testing with an alternate test  methodology [id number]) is advised. The SARS-CoV-2 RNA is generally  detectable in upper and lower respiratory sp ecimens during the acute  phase of infection. The expected result is Negative. Fact Sheet for Patients:  BoilerBrush.com.cy Fact Sheet for Healthcare Providers: https://pope.com/ This test is not yet approved or cleared by the [place] FDA and has been authorized for detection and/or diagnosis of SARS-CoV-2 by FDA under an Emergency Use Authorization (EUA).  This EUA will remain in effect (meaning this test can be used) for the duration of the COVID-19 declaration under Section 564(b)(1) of the Act, 21 U.S.C. section 360bbb-3(b)(1), unless the authorization is terminated or revoked sooner. Performed at [facility], [address], [place], [place] [address]   I-Stat beta hCG blood, ED     Status: None   Collection Time: 04/27/19  4:52 PM  Result Value Ref Range   I-stat hCG, quantitative <5.0 <5 mIU/mL   Comment 3            Comment:   GEST. AGE      CONC.  (mIU/mL)   <=1 WEEK        5 - 50     2 WEEKS       50 - 500     3 WEEKS       100 - 10,000     4 WEEKS     1,000 - 30,000        FEMALE AND NON-PREGNANT FEMALE:     LESS THAN 5 mIU/mL   Lactic acid, plasma     Status: None    Collection Time: 04/27/19  6:22 PM  Result Value Ref Range   Lactic Acid, Venous 1.1 0.5 - 1.9 mmol/L    Comment: Performed at [facility], [address] [place], [place] [address]   Dg Chest Port 1 View  Result Date: 04/27/2019 CLINICAL DATA:  Chest pain and myalgias. Fever. EXAM: PORTABLE CHEST 1 VIEW COMPARISON:  08/29/2015. FINDINGS: Borderline enlarged cardiac silhouette, accentuated by the portable AP technique and a poor inspiration. Right-sided aortic arch. Clear lungs. Diffuse osteopenia. Stable left upper abdominal surgical clips and surgical clips in the inferior mediastinum. IMPRESSION: No acute abnormality. Electronically Signed   By: Beckie Salts M.D.   On: 04/27/2019 17:14   Dg Foot Complete Left  Result Date: 04/27/2019 CLINICAL DATA:  Left great and 2nd toe ulcers. Clinical concern for osteomyelitis. Diabetes. EXAM: LEFT FOOT - COMPLETE 3+ VIEW COMPARISON:  04/11/2019 FINDINGS: Stable mild diffuse distal soft tissue swelling. No bone destruction,  periosteal reaction or soft tissue gas. Small calcaneal spurs. IMPRESSION: Stable mild distal soft tissue swelling without evidence of underlying osteomyelitis. Electronically Signed   By: Claudie Revering M.D.   On: 04/27/2019 17:17    Pending Labs Unresulted Labs (From admission, onward)    Start     Ordered   04/27/19 1622  Blood Culture (routine x 2)  BLOOD CULTURE X 2,   STAT     04/27/19 1621   04/27/19 1622  Urinalysis, Routine w reflex microscopic  ONCE - STAT,   STAT     04/27/19 1621   04/27/19 1622  Urine culture  ONCE - STAT,   STAT     04/27/19 1621          Vitals/Pain Today's Vitals   04/27/19 1730 04/27/19 1745 04/27/19 1755 04/27/19 1830  BP: (!) 111/59 (!) 110/52  104/61  Pulse: 87 92  85  Resp: 16 16  15   Temp:  99.7 F (37.6 C)    TempSrc:  Oral    SpO2: 97% 98%  97%  Weight:      Height:      PainSc:   0-No pain     Isolation Precautions No active isolations  Medications Medications   aztreonam (AZACTAM) 1 g in sodium chloride 0.9 % 100 mL IVPB (has no administration in time range)  vancomycin (VANCOCIN) 1,250 mg in sodium chloride 0.9 % 250 mL IVPB (has no administration in time range)  aztreonam (AZACTAM) 2 g in sodium chloride 0.9 % 100 mL IVPB (0 g Intravenous Stopped 04/27/19 1825)  metroNIDAZOLE (FLAGYL) IVPB 500 mg (0 mg Intravenous Stopped 04/27/19 1735)  sodium chloride 0.9 % bolus 1,000 mL (0 mLs Intravenous Stopped 04/27/19 1740)  vancomycin (VANCOCIN) 1,750 mg in sodium chloride 0.9 % 500 mL IVPB (0 mg Intravenous Stopped 04/27/19 1845)    Mobility walks with person assist Moderate fall risk   Focused Assessments Cardiac Assessment Handoff:  Cardiac Rhythm: Sinus tachycardia Lab Results  Component Value Date   CKTOTAL 43 03/31/2008   CKMB 0.8 03/31/2008   TROPONINI <0.01        NO INDICATION OF MYOCARDIAL INJURY. 03/31/2008   No results found for: DDIMER Does the Patient currently have chest pain? No     R Recommendations: See Admitting Provider Note  Report given to:   Additional Notes: Currently being treated based on Sepsis protocol. C/o of back pain at this time. Dr. Posey Pronto informed. covid negative.

## 2019-04-27 NOTE — Progress Notes (Signed)
Pharmacy Antibiotic Note  Dominique Evans is a 53 y.o. female admitted on 04/27/2019 with sepsis.  Pharmacy has been consulted for aztreonam and vancomycin dosing. Pt has been treated for MRSA for the last 2 weeks and is s/p toe debridement 04/26/19. Pt has hx of anaphylaxis to PCNs and no hx of cephalosporin use. Tmax 100.2 and WBC 7.6. Renal function is at baseline.  Plan: Aztreonam 2g IV x1 then aztreonam 1g IV q8h Vancomycin 1.75g IV x1 then vancomycin 1.25g IV q24h Monitor clinical progression and vancomycin levels as indicated/needed F/u C&S  Height: 5\' 5"  (165.1 cm) Weight: 193 lb (87.5 kg) IBW/kg (Calculated) : 57  Temp (24hrs), Avg:98.8 F (37.1 C), Min:97.3 F (36.3 C), Max:100.2 F (37.9 C)  Recent Labs  Lab 04/27/19 1622  WBC 7.6  CREATININE 0.94  LATICACIDVEN 1.5    Estimated Creatinine Clearance: 74.7 mL/min (by C-G formula based on SCr of 0.94 mg/dL).    Allergies  Allergen Reactions  . Penicillins Anaphylaxis    Has patient had a PCN reaction causing immediate rash, facial/tongue/throat swelling, SOB or lightheadedness with hypotension: yes Has patient had a PCN reaction causing severe rash involving mucus membranes or skin necrosis: unknown Has patient had a PCN reaction that required hospitalization unknown Has patient had a PCN reaction occurring within the last 10 years: no If all of the above answers are "NO", then may proceed with Cephalosporin use.   . Sulfonamide Derivatives Anaphylaxis  . Clindamycin/Lincomycin Nausea And Vomiting  . Ciprofloxacin Hives    Antimicrobials this admission: Azactam 7/9 >>  Vancomycin 7/9 >> Metronidazole x1 7/9   Dose adjustments this admission: n/a  Microbiology results: pending  Thank you for allowing pharmacy to be a part of this patient's care.  Natale Lay 04/27/2019 5:06 PM

## 2019-04-27 NOTE — H&P (Signed)
History and Physical    Dominique Evans OZH:[id number] DOB: 30-Sep-1966 DOA: 04/27/2019  PCP: Haywood Pao, MD  Patient coming from: Home  I have personally briefly reviewed patient's old medical records in [place]  Chief Complaint: Fevers, left foot pain  HPI: Dominique Evans is a 53 y.o. female with medical history significant for type 2 diabetes with neuropathy, asthma, hypertension, hyperlipidemia, GERD, and chronic back pain who presents to the ED for evaluation of subjective fevers, fatigue, and left toe pain.    She was initially seen in the ED on 04/10/2019 after reportedly injuring her right fourth/fifth toes requiring laceration repair.  She was also noted to have erythema of her left second toe during that visit.  X-ray was negative for fracture or dislocation or obvious osteomyelitis.  She was treated with doxycycline which she has been taking continuously up to now.  She was seen by her podiatrist yesterday on 04/26/2019 and underwent debridement of necrotic tissue and hyperkeratotic callus lesions.  She was recommended to continue oral antibiotics for the cellulitis of her left second toe.  She presents today after waking up and feeling unwell with body aches, fevers, continued pain in her toes, and persistent swelling/erythema of her left second toe.  She reports chronic peripheral neuropathy and chronic back pain both of which are unchanged.  She otherwise denies any chest pain, dyspnea, abdominal pain, diarrhea, constipation.  She does report occasional urinary urgency with incontinence ongoing for a few months but denies dysuria.  ED Course:  Initial vitals showed BP 108/62, pulse 92, RR 16, temp 100.2 Fahrenheit, SPO2 100% on 2 L supplemental O2 via [place].  Labs are notable for WBC 7.6, hemoglobin 11.1, platelets 181,000, Sodium 138, potassium 3.6, BUN 22, creatinine 0.94, serum glucose 135, i-STAT beta-hCG <5.0, lactic acid 1.5.  Blood cultures were obtained and pending.   SARS-CoV-2 test is negative.  Portable chest x-ray was negative for focal consolidation, effusion, or edema.  X-ray of the left foot showed stable mild distal soft tissue swelling without obvious evidence of underlying osteomyelitis.  Patient was given 1 L normal saline and started on IV vancomycin, aztreonam, and Flagyl due to penicillin and clindamycin allergies.  The hospitalist service was consulted to admit for further evaluation and management.   Review of Systems: All systems reviewed and are negative except as documented in history of present illness above.   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 where it was done,  about 6 yrs. 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    Obesity    Panic attacks    Peripheral neuropathy    bilateral feet   PONV (postoperative nausea and vomiting)    Psoriasis    Seasonal allergies    Status post dilation of esophageal narrowing    Unspecified essential hypertension    Visual disturbance     Past Surgical History:  Procedure Laterality Date   BOTOX INJECTION N/A 06/07/2015     Procedure: BOTOX INJECTION;  Surgeon: Inda Castle, MD;  Location: [facility];  Service: Endoscopy;  Laterality: N/A;   COLONOSCOPY WITH PROPOFOL N/A 06/07/2015   Procedure: COLONOSCOPY WITH PROPOFOL;  Surgeon: Inda Castle, MD;  Location: [facility];  Service: Endoscopy;  Laterality: N/A;   ELBOW FRACTURE SURGERY     left   ESOPHAGEAL MANOMETRY N/A 04/08/2015   Procedure: ESOPHAGEAL MANOMETRY (EM);  Surgeon: Inda Castle, MD;  Location: [facility];  Service: Endoscopy;  Laterality: N/A;   ESOPHAGOGASTRODUODENOSCOPY (EGD) WITH PROPOFOL N/A 06/07/2015   Procedure: ESOPHAGOGASTRODUODENOSCOPY (EGD) WITH PROPOFOL;  Surgeon: Inda Castle, MD;  Location: [facility];  Service: Endoscopy;  Laterality: N/A;   GASTRIC ROUX-EN-Y  10/05/2011   Procedure: LAPAROSCOPIC ROUX-EN-Y GASTRIC;  Surgeon: Edward Jolly, MD;  Location: [facility];  Service: General;  Laterality: N/A;  UPPER ENDOSCOPY   lap band removal      LAPAROSCOPIC GASTRIC BANDING  02/2006   w/ truncal vagotomy   LUMBAR LAMINECTOMY/DECOMPRESSION MICRODISCECTOMY Left 11/30/2012   Procedure: MICRO LUMBAR DECOMPRESSION L4-5 ON THE LEFT;  Surgeon: Johnn Hai, MD;  Location: [facility];  Service: Orthopedics;  Laterality: Left;  MICRO LUMBAR DECOMPRESSION L4-5 ON THE LEFT   LUMBAR LAMINECTOMY/DECOMPRESSION MICRODISCECTOMY N/A 03/30/2013   Procedure: REDO MICRO LUMBAR DECOMPRESSION L4-5;  Surgeon: Johnn Hai, MD;  Location: [facility];  Service: Orthopedics;  Laterality: N/A;   MAXIMUM ACCESS (MAS)POSTERIOR LUMBAR INTERBODY FUSION (PLIF) 1 LEVEL N/A 05/23/2014   Procedure: FOR MAXIMUM ACCESS (MAS) POSTERIOR LUMBAR INTERBODY FUSION (PLIF) Lumbar Four-Five;  Surgeon: Eustace Moore, MD;  Location: [facility];  Service: Neurosurgery;  Laterality: N/A;  FOR MAXIMUM ACCESS (MAS) POSTERIOR LUMBAR INTERBODY FUSION (PLIF) Lumbar Four-Five   NEPHRECTOMY  2002   left   PATENT DUCTUS ARTERIOUS REPAIR     SHOULDER ARTHROSCOPY      right shoulder    Social History:  reports that she quit smoking about 36 years ago. Her smoking use included cigarettes. She smoked 2.00 packs per day. She has never used smokeless tobacco. She reports that she does not drink alcohol or use drugs.  Allergies  Allergen Reactions   Penicillins Anaphylaxis    Has patient had a PCN reaction causing immediate rash, facial/tongue/throat swelling, SOB or lightheadedness with hypotension: yes Has patient had a PCN reaction causing severe rash involving mucus membranes or skin necrosis: unknown Has patient had a PCN reaction that required hospitalization unknown Has patient had a PCN reaction occurring within the last 10 years: no If all of the above answers are "NO", then may proceed with Cephalosporin use.    Sulfonamide Derivatives Anaphylaxis   Clindamycin/Lincomycin Nausea And Vomiting   Ciprofloxacin Hives    Family History  Problem Relation Age of Onset   Hypertension Father    Skin cancer Father    Heart disease Father        before age 39   Heart attack  Father    Hyperlipidemia Father    Diabetes Mother    Hypertension Mother    Skin cancer Mother    Breast cancer Mother        Melanoma   Heart disease Mother    Hyperlipidemia Mother    Lung cancer Mother    Brain cancer Mother    Hypertension Sister    Heart disease Sister        before age 53   Heart attack Sister    Hyperlipidemia Sister    Breast cancer Maternal Aunt    Colon cancer Neg Hx    Colon polyps Neg Hx    Esophageal cancer Neg Hx    Gallbladder disease Neg Hx    Stomach cancer Neg Hx      Prior to Admission medications   Medication Sig Start Date End Date Taking? Authorizing Provider  albuterol (ACCUNEB) 1.25 MG/3ML nebulizer solution Take 3 mLs by nebulization every 4 (four) hours as needed for wheezing or shortness of breath.  11/20/14   [provider]  albuterol (PROVENTIL HFA;VENTOLIN HFA) 108 (90 BASE)  MCG/ACT inhaler Inhale 1-2 puffs into the lungs every 4 (four) hours as needed for wheezing or shortness of breath.    [provider]  amitriptyline (ELAVIL) 25 MG tablet Take 50 mg by mouth at bedtime.     [provider]  Apoaequorin (PREVAGEN) 10 MG CAPS Take 10 mg by mouth daily.    [provider]  BAYER CONTOUR TEST test strip PT CHECKS SUGARS TWICE DAILY DX E11.29 09/23/15   [provider]  CALCIUM-VITAMIN D PO Take 1 tablet by mouth daily.     [provider]  dexlansoprazole (DEXILANT) 60 MG capsule Dexilant 60 mg capsule, delayed release  twice daily    [provider]  doxycycline (VIBRAMYCIN) 100 MG capsule Take 1 capsule (100 mg total) by mouth 2 (two) times daily. 04/11/19   Petrucelli, Samantha R, PA-C  ferrous sulfate 325 (65 FE) MG tablet Take 325 mg by mouth daily with breakfast.    [provider]  fexofenadine (ALLEGRA) 180 MG tablet Take 180 mg by mouth daily.    [provider]  Fluticasone-Salmeterol (ADVAIR DISKUS) 250-50 MCG/DOSE AEPB 2 (two) times daily.    [provider]  furosemide (LASIX) 20 MG tablet Take 20 mg by mouth every morning.     [provider]  lansoprazole (PREVACID) 30 MG capsule Take 1 capsule (30 mg total) by mouth 2 (two) times daily before a meal. Wait at least 30 minutes before eating Patient not taking: Reported on 03/01/2019 09/19/18   Mauri Pole, MD  losartan (COZAAR) 50 MG tablet Take 50 mg by mouth every morning.  09/21/11   [provider]  methocarbamol (ROBAXIN) 750 MG tablet Take 750 mg by mouth 3 (three) times daily.    [provider]  methylPREDNISolone (MEDROL DOSEPAK) 4 MG TBPK tablet TAKE AS DIRECTED IN PACKET 03/28/19   [provider]  montelukast (SINGULAIR) 10 MG tablet Take 10 mg by mouth at bedtime.     [provider]  Multiple Vitamins-Minerals (MULTIVITAMIN PO) Take 1 tablet by mouth daily.      [provider]  mupirocin ointment (BACTROBAN) 2 % APPLY TO TOE WOUNDS TWICE DAILY FOR INFECTION 04/12/19   [provider]  oxyCODONE-acetaminophen (PERCOCET/ROXICET) 5-325 MG tablet Take 1 tablet by mouth every 4 (four) hours.     [provider]  polyethylene glycol (MIRALAX)  powder Take 17 g by mouth daily.     [provider]  potassium citrate (UROCIT-K) 10 MEQ (1080 MG) SR tablet Take 10 mEq by mouth 2 (two) times daily.     [provider]  pregabalin (LYRICA) 150 MG capsule Take 150 mg by mouth 2 (two) times daily.    [provider]  PRISTIQ 50 MG 24 hr tablet Take 50 mg by mouth daily. 04/20/15   [provider]  rifampin (RIFADIN) 150 MG capsule  04/23/19   [provider]  rosuvastatin (CRESTOR) 10 MG tablet Take 5 mg by mouth every morning.     [provider]  Secukinumab, 300 MG Dose, (COSENTYX, 300 MG DOSE,) 150 MG/ML SOSY Inject 300 mg into the skin every 30 (thirty) days.    [provider]  triamcinolone cream (KENALOG) 0.5 % triamcinolone acetonide 0.5 % topical cream  APPLY TO AFFECTED AREA 2 TO 3 TIMES A DAY    [provider]  verapamil (COVERA HS) 240 MG (CO) 24 hr tablet Take 240 mg by mouth every morning.     [provider]    Physical Exam: Vitals:   04/27/19 1730 04/27/19 1745 04/27/19 1830 04/27/19 1940  BP: (!) 111/59 (!) 110/52 104/61 (!) 97/56  Pulse: 87 92 85 79  Resp: 16 16 15 18   Temp:  99.7 F (37.6 C)  98.9 F (37.2 C)  TempSrc:  Oral  Oral  SpO2: 97% 98% 97% 97%  Weight:      Height:        Constitutional: Obese woman resting supine in bed, NAD, calm, appears tired Eyes: PERRL, lids and conjunctivae normal ENMT: Mucous membranes are moist. Posterior pharynx clear of any exudate or lesions. Neck: normal, supple, no masses. Respiratory: clear to auscultation bilaterally, no wheezing, no crackles. Normal respiratory effort. No accessory muscle  use.  Cardiovascular: Regular rate and rhythm, 2/6 systolic murmur present. No extremity edema. 2+ pedal pulses. Abdomen: no tenderness, no masses palpated. No hepatosplenomegaly. Bowel sounds positive.  Musculoskeletal: no clubbing / cyanosis. No joint deformity upper and lower extremities. Good ROM, no contractures. Normal muscle tone.  Skin: Second left toe swollen with slight erythema, superficial early ulceration present left medial first toe, no open wounds or discharge. Neurologic: CN 2-12 grossly intact. Sensation diminished in the toes, Strength 5/5 in all 4.  Psychiatric: Normal judgment and insight. Alert and oriented x 3. Normal mood.   Labs on Admission: I have personally reviewed following labs and imaging studies  CBC: Recent Labs  Lab 04/27/19 1622  WBC 7.6  NEUTROABS 6.5  HGB 11.1*  HCT 34.8*  MCV 85.7  PLT 867   Basic Metabolic Panel: Recent Labs  Lab 04/27/19 1622  NA 138  K 3.6  CL 104  CO2 23  GLUCOSE 135*  BUN 22*  CREATININE 0.94  CALCIUM 8.9   GFR: Estimated Creatinine Clearance: 74.7 mL/min (by C-G formula based on SCr of 0.94 mg/dL). Liver Function Tests: Recent Labs  Lab 04/27/19 1622  AST 26  ALT 22  ALKPHOS 87  BILITOT 0.6  PROT 7.0  ALBUMIN 3.6   No results for input(s): LIPASE, AMYLASE in the last 168 hours. No results for input(s): AMMONIA in the last 168 hours. Coagulation Profile: Recent Labs  Lab 04/27/19 1622  INR 1.1   Cardiac Enzymes: No results for input(s): CKTOTAL, CKMB, CKMBINDEX, TROPONINI in the last 168 hours. BNP (last 3 results) No results for input(s): PROBNP in the  last 8760 hours. HbA1C: No results for input(s): HGBA1C in the last 72 hours. CBG: No results for input(s): GLUCAP in the last 168 hours. Lipid Profile: No results for input(s): CHOL, HDL, LDLCALC, TRIG, CHOLHDL, LDLDIRECT in the last 72 hours. Thyroid Function Tests: No results for input(s): TSH, T4TOTAL, FREET4, T3FREE, THYROIDAB in the  last 72 hours. Anemia Panel: No results for input(s): VITAMINB12, FOLATE, FERRITIN, TIBC, IRON, RETICCTPCT in the last 72 hours. Urine analysis:    Component Value Date/Time   COLORURINE YELLOW 09/09/2018 2040   APPEARANCEUR CLEAR 09/09/2018 2040   LABSPEC 1.011 09/09/2018 2040   PHURINE 5.0 09/09/2018 2040   GLUCOSEU NEGATIVE 09/09/2018 2040   HGBUR NEGATIVE 09/09/2018 2040   BILIRUBINUR NEGATIVE 09/09/2018 2040   KETONESUR NEGATIVE 09/09/2018 2040   PROTEINUR NEGATIVE 09/09/2018 2040   NITRITE NEGATIVE 09/09/2018 2040   LEUKOCYTESUR NEGATIVE 09/09/2018 2040    Radiological Exams on Admission: Dg Chest Port 1 View  Result Date: 04/27/2019 CLINICAL DATA:  Chest pain and myalgias. Fever. EXAM: PORTABLE CHEST 1 VIEW COMPARISON:  08/29/2015. FINDINGS: Borderline enlarged cardiac silhouette, accentuated by the portable AP technique and a poor inspiration. Right-sided aortic arch. Clear lungs. Diffuse osteopenia. Stable left upper abdominal surgical clips and surgical clips in the inferior mediastinum. IMPRESSION: No acute abnormality. Electronically Signed   By: Claudie Revering M.D.   On: 04/27/2019 17:14   Dg Foot Complete Left  Result Date: 04/27/2019 CLINICAL DATA:  Left great and 2nd toe ulcers. Clinical concern for osteomyelitis. Diabetes. EXAM: LEFT FOOT - COMPLETE 3+ VIEW COMPARISON:  04/11/2019 FINDINGS: Stable mild diffuse distal soft tissue swelling. No bone destruction, periosteal reaction or soft tissue gas. Small calcaneal spurs. IMPRESSION: Stable mild distal soft tissue swelling without evidence of underlying osteomyelitis. Electronically Signed   By: Claudie Revering M.D.   On: 04/27/2019 17:17    EKG: Independently reviewed. Sinus tachycardia, borderline LAD, rate is faster when compared to prior.  Assessment/Plan Principal Problem:   Cellulitis of left foot Active Problems:   Type 2 diabetes mellitus with foot ulcer ([place])   Hypertension associated with diabetes ([place])    Chronic pain   Hyperlipidemia  Dominique Evans is a 53 y.o. female with medical history significant for type 2 diabetes with neuropathy, asthma, hypertension, hyperlipidemia, GERD, and chronic back pain who is admitted with cellulitis of the left toe failing outpatient therapy.  Cellulitis of left second toe: Has failed outpatient therapy with doxycycline.  No other obvious infectious source at this time. -Continue IV vancomycin and aztreonam given antibiotic allergies -Follow-up blood cultures, obtain urinalysis -Check ESR, CRP -Will obtain MRI of the left toes to assess for osteomyelitis  Type 2 diabetes with neuropathy: -Start sensitive SSI, adjust as needed -Continue home amitriptyline, Lyrica  Asthma: Currently stable. -Continue Advair, Singulair, as needed albuterol  Hypertension: Hold home losartan and verapamil due to soft blood pressure.  We will give gentle IV fluids overnight.  Hyperlipidemia: Continue rosuvastatin.  GERD: Continue PPI.  Chronic back pain: Status post decompressive lumbar laminectomy L4-5 and PLIF L4-5. -Continue home Percocet as needed, Elavil, and Lyrica  Depression: Continue venlafaxine.  DVT prophylaxis: subq heparin Code Status: Full code, confirmed with patient Family Communication: None present on admission Disposition Plan: Pending clinical progress Consults called: None Admission status: Observation   Zada Finders MD [facility]  If 7PM-7AM, please contact night-coverage [email or web address]  04/27/2019, 8:35 PM

## 2019-04-27 NOTE — ED Provider Notes (Signed)
[facility] EMERGENCY DEPARTMENT Provider Note   CSN: [id number] Arrival date & time: 04/27/19  1601    History   Chief Complaint Chief Complaint  Patient presents with  . Chest Pain  . Blood Infection    HPI Dominique Evans is a 53 y.o. female.     53yo F w/ PMH including T2DM, PDA repair, diabetic neuropathy, gastric bypass, chronic back pain who p/w fever and chest pain. Pt has been dealing with wounds on her left foot, was seen in ED on 6/22 and started on doxycycline. She saw Dr. Logan Bores, podiatry, yesterday and he debrided wounds and continued abx. Today, she woke up not feeling well with generalized intermittent chest pain, body aches, fevers, and mild occasional cough. She continues to have pain in her toes. She took percocet this morning for her chronic back pain. She hasn't eaten much today. No vomiting, diarrhea, SOB, urinary symptoms, sick contacts, or recent travel. EMS gave her 1g tylenol in route.  The history is provided by the patient.  Chest Pain   Past Medical History:  Diagnosis Date  . Allergic rhinitis   . Anemia   . Arthritis    spondylolisthesis- lumbar region  . Asthma    PFT 08/12/07: FEV1 2.97 (104%), FEV1% 84, TLC 4.89 (90%), DLCO 84%, +BD  . Blood transfusion    hx of 2002   . Blood transfusion without reported diagnosis    kidney, no issues  . Chronic constipation   . 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 Cough 03/23/13  . Depression   . Diabetes mellitus    pre gastric bypass  . Esophageal dysmotility 03/01/2015  . GERD (gastroesophageal reflux disease)   . H/O hiatal hernia   . Head injury due to trauma    age 6, went under car while sledding, had memory loss at that time  . 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 History of stress test    exercise stress test, ? where it was done,  about 6 yrs. ago,  results- negative   . Hydatidiform mole   . Hyperlipidemia   . Hypertension    . 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 Obesity   . Panic attacks   . Peripheral neuropathy    bilateral feet  . PONV (postoperative nausea and vomiting)   . Psoriasis   . Seasonal allergies   . Status post dilation of esophageal narrowing   . Unspecified essential hypertension   . Visual disturbance     Patient Active Problem List   Diagnosis Date Noted  . Hyperlipidemia 02/16/2019  . Chronic pain 09/24/2018  . Incisional hernia 09/24/2018  . Low back pain 09/24/2018  . Major depressive disorder with single episode 09/24/2018  . 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  . Esophageal dysmotility 03/01/2015  . Colon cancer screening 03/01/2015  . Lateral ventral hernia 03/01/2015  . S/P lumbar spinal fusion 05/23/2014  . Edema 06/28/2013  . 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  . HNP (herniated nucleus pulposus), lumbar 11/30/2012  . THORACIC AORTIC ANEURYSM 10/23/2010  . CONSTIPATION, CHRONIC 03/23/2010  . OBESITY, UNSPECIFIED 10/31/2007  . OBSTRUCTIVE SLEEP APNEA 10/31/2007  . CHRONIC RHINITIS 10/31/2007  . Cough variant asthma 10/31/2007  . GERD 10/31/2007  . DIABETES MELLITUS 10/04/2007  . HYPERTENSION 10/04/2007  . 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 NEPHRECTOMY, HX OF 10/04/2007    Past  Surgical History:  Procedure Laterality Date  . BOTOX INJECTION N/A 06/07/2015   Procedure: BOTOX INJECTION;  Surgeon: Louis Meckelobert D Kaplan, MD;  Location: [facility];  Service: Endoscopy;  Laterality: N/A;  . COLONOSCOPY WITH PROPOFOL N/A 06/07/2015   Procedure: COLONOSCOPY WITH PROPOFOL;  Surgeon: Louis Meckelobert D Kaplan, MD;  Location: [facility];  Service: Endoscopy;  Laterality: N/A;  . ELBOW FRACTURE SURGERY     left  . ESOPHAGEAL MANOMETRY N/A 04/08/2015   Procedure: ESOPHAGEAL MANOMETRY (EM);  Surgeon: Louis Meckelobert D Kaplan, MD;  Location: [facility];  Service: Endoscopy;  Laterality: N/A;  . ESOPHAGOGASTRODUODENOSCOPY (EGD) WITH PROPOFOL N/A 06/07/2015   Procedure:  ESOPHAGOGASTRODUODENOSCOPY (EGD) WITH PROPOFOL;  Surgeon: Louis Meckelobert D Kaplan, MD;  Location: [facility];  Service: Endoscopy;  Laterality: N/A;  . 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 lap band removal     . LAPAROSCOPIC GASTRIC BANDING  02/2006   w/ truncal vagotomy  . LUMBAR LAMINECTOMY/DECOMPRESSION MICRODISCECTOMY Left 11/30/2012   Procedure: MICRO LUMBAR DECOMPRESSION L4-5 ON THE LEFT;  Surgeon: Javier DockerJeffrey C Beane, MD;  Location: [facility];  Service: Orthopedics;  Laterality: Left;  MICRO LUMBAR DECOMPRESSION L4-5 ON THE LEFT  . LUMBAR LAMINECTOMY/DECOMPRESSION MICRODISCECTOMY N/A 03/30/2013   Procedure: REDO MICRO LUMBAR DECOMPRESSION L4-5;  Surgeon: Javier DockerJeffrey C Beane, MD;  Location: [facility];  Service: Orthopedics;  Laterality: N/A;  . MAXIMUM ACCESS (MAS)POSTERIOR LUMBAR INTERBODY FUSION (PLIF) 1 LEVEL N/A 05/23/2014   Procedure: FOR MAXIMUM ACCESS (MAS) POSTERIOR LUMBAR INTERBODY FUSION (PLIF) Lumbar Four-Five;  Surgeon: Tia Alertavid S Jones, MD;  Location: [facility];  Service: Neurosurgery;  Laterality: N/A;  FOR MAXIMUM ACCESS (MAS) POSTERIOR LUMBAR INTERBODY FUSION (PLIF) Lumbar Four-Five  . NEPHRECTOMY  2002   left  . PATENT DUCTUS ARTERIOUS REPAIR    . SHOULDER ARTHROSCOPY     right shoulder     OB History   No obstetric history on file.      Home Medications    Prior to Admission medications   Medication Sig Start Date End Date Taking? Authorizing Provider  albuterol (ACCUNEB) 1.25 MG/3ML nebulizer solution Take 3 mLs by nebulization every 4 (four) hours as needed for wheezing or shortness of breath.  11/20/14   [provider]  albuterol (PROVENTIL HFA;VENTOLIN HFA) 108 (90 BASE) MCG/ACT inhaler Inhale 1-2 puffs into the lungs every 4 (four) hours as needed for wheezing or shortness of breath.    [provider]  amitriptyline  (ELAVIL) 25 MG tablet Take 50 mg by mouth at bedtime.     [provider]  Apoaequorin (PREVAGEN) 10 MG CAPS Take 10 mg by mouth daily.    [provider]  BAYER CONTOUR TEST test strip PT CHECKS SUGARS TWICE DAILY DX E11.29 09/23/15   [provider]  CALCIUM-VITAMIN D PO Take 1 tablet by mouth daily.     [provider]  dexlansoprazole (DEXILANT) 60 MG capsule Dexilant 60 mg capsule, delayed release  twice daily    [provider]  doxycycline (VIBRAMYCIN) 100 MG capsule Take 1 capsule (100 mg total) by mouth 2 (two) times daily. 04/11/19   Petrucelli, Samantha R, PA-C  ferrous sulfate 325 (65 FE) MG tablet Take 325 mg by mouth daily with breakfast.    [provider]  fexofenadine (ALLEGRA) 180 MG tablet Take 180 mg by mouth daily.    [provider]  Fluticasone-Salmeterol (ADVAIR DISKUS) 250-50 MCG/DOSE AEPB 2 (two) times daily.    [provider]  furosemide (LASIX) 20 MG tablet Take 20 mg by mouth every morning.     [provider]  lansoprazole (PREVACID) 30 MG capsule Take 1 capsule (30 mg total) by mouth 2 (two) times daily before a meal. Wait at least 30 minutes before eating Patient not taking: Reported on 03/01/2019 09/19/18   Napoleon Form, MD  losartan (COZAAR) 50 MG tablet Take 50 mg by mouth every morning.  09/21/11   [provider]  methocarbamol (ROBAXIN) 750 MG tablet Take 750 mg by mouth 3 (three) times daily.    [provider]  methylPREDNISolone (MEDROL DOSEPAK) 4 MG TBPK tablet TAKE AS DIRECTED IN PACKET 03/28/19   [provider]  montelukast (SINGULAIR) 10 MG tablet Take 10 mg by mouth at bedtime.     [provider]  Multiple Vitamins-Minerals (MULTIVITAMIN PO) Take 1 tablet by mouth daily.     [provider]  mupirocin ointment (BACTROBAN) 2 % APPLY TO TOE WOUNDS TWICE DAILY FOR INFECTION 04/12/19   [provider]   oxyCODONE-acetaminophen (PERCOCET/ROXICET) 5-325 MG tablet Take 1 tablet by mouth every 4 (four) hours.     [provider]  polyethylene glycol (MIRALAX) powder Take 17 g by mouth daily.     [provider]  potassium citrate (UROCIT-K) 10 MEQ (1080 MG) SR tablet Take 10 mEq by mouth 2 (two) times daily.     [provider]  pregabalin (LYRICA) 150 MG capsule Take 150 mg by mouth 2 (two) times daily.    [provider]  PRISTIQ 50 MG 24 hr tablet Take 50 mg by mouth daily. 04/20/15   [provider]  rifampin (RIFADIN) 150 MG capsule  04/23/19   [provider]  rosuvastatin (CRESTOR) 10 MG tablet Take 5 mg by mouth every morning.     [provider]  Secukinumab, 300 MG Dose, (COSENTYX, 300 MG DOSE,) 150 MG/ML SOSY Inject 300 mg into the skin every 30 (thirty) days.    [provider]  triamcinolone cream (KENALOG) 0.5 % triamcinolone acetonide 0.5 % topical cream  APPLY TO AFFECTED AREA 2 TO 3 TIMES A DAY    [provider]  verapamil (COVERA HS) 240 MG (CO) 24 hr tablet Take 240 mg by mouth every morning.     [provider]    Family History Family History  Problem Relation Age of Onset  . Hypertension Father   . Skin cancer Father   . Heart disease Father        before age 43  . Heart attack Father   . Hyperlipidemia Father   . Diabetes Mother   . Hypertension Mother   . Skin cancer Mother   . Breast cancer Mother        Melanoma  . Heart disease Mother   . Hyperlipidemia Mother   . Lung cancer Mother   . Brain cancer Mother   . Hypertension Sister   . Heart disease Sister        before age 71  . Heart attack Sister   . Hyperlipidemia Sister   . Breast cancer Maternal Aunt   . Colon cancer Neg Hx   . Colon polyps Neg Hx   . Esophageal cancer Neg Hx   . Gallbladder disease Neg Hx   . Stomach cancer Neg Hx     Social History Social History  Tobacco Use  . Smoking status: Former  Smoker    Packs/day: 2.00    Types: Cigarettes    Quit date: 10/19/1982    Years since quitting: 36.5  . Smokeless tobacco: Never Used  Substance Use Topics  . Alcohol use: No    Alcohol/week: 0.0 standard drinks  . Drug use: No     Allergies   Penicillins, Sulfonamide derivatives, Clindamycin/lincomycin, and Ciprofloxacin   Review of Systems Review of Systems  Cardiovascular: Positive for chest pain.   All other systems reviewed and are negative except that which was mentioned in HPI   Physical Exam Updated Vital Signs BP 104/61   Pulse 85   Temp 99.7 F (37.6 C) (Oral)   Resp 15   Ht 5\' 5"  (1.651 m)   Wt 87.5 kg   LMP 12/21/2015   SpO2 97%   BMI 32.12 kg/m   Physical Exam Vitals signs and nursing note reviewed.  Constitutional:      Appearance: She is well-developed.     Comments: Ill appearing but non-toxic  HENT:     Head: Normocephalic and atraumatic.  Eyes:     Conjunctiva/sclera: Conjunctivae normal.  Neck:     Musculoskeletal: Neck supple.  Cardiovascular:     Rate and Rhythm: Regular rhythm. Tachycardia present.     Pulses: Normal pulses.  Pulmonary:     Effort: Pulmonary effort is normal. No respiratory distress.  Chest:     Chest wall: Tenderness present.  Abdominal:     General: Bowel sounds are normal. There is no distension.     Palpations: Abdomen is soft.     Tenderness: There is no abdominal tenderness.  Musculoskeletal:     Comments: Edema, redness, tenderness L 2nd toe; superficial ulceration L great toe without drainage or erythema  Skin:    General: Skin is warm and dry.  Neurological:     Mental Status: She is alert and oriented to person, place, and time.     Comments: Fluent speech  Psychiatric:        Mood and Affect: Mood is anxious.        Judgment: Judgment normal.      ED Treatments / Results  Labs (all labs ordered are listed, but only abnormal results are displayed) Labs Reviewed  COMPREHENSIVE METABOLIC PANEL -  Abnormal; Notable for the following components:      Result Value   Glucose, Bld 135 (*)    BUN 22 (*)    All other components within normal limits  CBC WITH DIFFERENTIAL/PLATELET - Abnormal; Notable for the following components:   Hemoglobin 11.1 (*)    HCT 34.8 (*)    Lymphs Abs 0.6 (*)    All other components within normal limits  SARS CORONAVIRUS 2 (HOSPITAL ORDER, [facility])  CULTURE, BLOOD (ROUTINE X 2)  CULTURE, BLOOD (ROUTINE X 2)  URINE CULTURE  LACTIC ACID, PLASMA  APTT  PROTIME-INR  LACTIC ACID, PLASMA  URINALYSIS, ROUTINE W REFLEX MICROSCOPIC  I-STAT BETA HCG BLOOD, ED ([facility], [facility], [facility] ONLY)    EKG EKG Interpretation  Date/Time:  Thursday April 27 2019 16:03:00 EDT Ventricular Rate:  116 PR Interval:    QRS Duration: 95 QT Interval:  323 QTC Calculation: 449 R Axis:   -24 Text Interpretation:  Sinus tachycardia Borderline left axis deviation Since last tracing rate faster Confirmed by Theotis Burrow [id number]) on 04/27/2019 4:23:07 PM   Radiology Dg Chest Port 1 View  Result Date:  04/27/2019 CLINICAL DATA:  Chest pain and myalgias. Fever. EXAM: PORTABLE CHEST 1 VIEW COMPARISON:  08/29/2015. FINDINGS: Borderline enlarged cardiac silhouette, accentuated by the portable AP technique and a poor inspiration. Right-sided aortic arch. Clear lungs. Diffuse osteopenia. Stable left upper abdominal surgical clips and surgical clips in the inferior mediastinum. IMPRESSION: No acute abnormality. Electronically Signed   By: Beckie SaltsSteven  Reid M.D.   On: 04/27/2019 17:14   Dg Foot Complete Left  Result Date: 04/27/2019 CLINICAL DATA:  Left great and 2nd toe ulcers. Clinical concern for osteomyelitis. Diabetes. EXAM: LEFT FOOT - COMPLETE 3+ VIEW COMPARISON:  04/11/2019 FINDINGS: Stable mild diffuse distal soft tissue swelling. No bone destruction, periosteal reaction or soft tissue gas. Small calcaneal spurs. IMPRESSION: Stable mild distal soft tissue swelling without  evidence of underlying osteomyelitis. Electronically Signed   By: Beckie SaltsSteven  Reid M.D.   On: 04/27/2019 17:17    Procedures .Critical Care Performed by: Laurence SpatesLittle, Anshu Wehner Morgan, MD Authorized by: Laurence SpatesLittle, Kionna Brier Morgan, MD   Critical care provider statement:    Critical care time (minutes):  30   Critical care time was exclusive of:  Separately billable procedures and treating other patients   Critical care was necessary to treat or prevent imminent or life-threatening deterioration of the following conditions:  Sepsis   Critical care was time spent personally by me on the following activities:  Development of treatment plan with patient or surrogate, evaluation of patient's response to treatment, examination of patient, obtaining history from patient or surrogate, ordering and performing treatments and interventions, ordering and review of laboratory studies, ordering and review of radiographic studies, re-evaluation of patient's condition and review of old charts   (including critical care time)  Medications Ordered in ED Medications  aztreonam (AZACTAM) 1 g in sodium chloride 0.9 % 100 mL IVPB (has no administration in time range)  vancomycin (VANCOCIN) 1,250 mg in sodium chloride 0.9 % 250 mL IVPB (has no administration in time range)  aztreonam (AZACTAM) 2 g in sodium chloride 0.9 % 100 mL IVPB (0 g Intravenous Stopped 04/27/19 1825)  metroNIDAZOLE (FLAGYL) IVPB 500 mg (0 mg Intravenous Stopped 04/27/19 1735)  sodium chloride 0.9 % bolus 1,000 mL (0 mLs Intravenous Stopped 04/27/19 1740)  vancomycin (VANCOCIN) 1,750 mg in sodium chloride 0.9 % 500 mL IVPB (1,750 mg Intravenous New Bag/Given 04/27/19 1636)     Initial Impression / Assessment and Plan / ED Course  I have reviewed the triage vital signs and the nursing notes.  Pertinent labs & imaging results that were available during my care of the patient were reviewed by me and considered in my medical decision making (see chart for details).         Patient was ill-appearing but nontoxic on exam, initially 100.2 orally and mildly tachycardic, blood pressure stable.  Differential includes COVID-19, other viral URI, or sepsis with potential source of foot.  Obtain blood and urine cultures, ordered broad-spectrum antibiotics, gave fluid bolus.  Initial lactate normal, CBC reassuring, normal CMP.  COVID-19 test negative.  Chest x-ray clear.  Foot x-ray does not show obvious signs of osteomyelitis.  I am suspicious of worsening infection based on appearance.  Discussed admission with [facility], Dr. Allena KatzPatel, and patient admitted for further treatment.   Halina MaidensSusan Amon was evaluated in Emergency Department on 04/27/2019 for the symptoms described in the history of present illness. She was evaluated in the context of the global COVID-19 pandemic, which necessitated consideration that the patient might be at risk for infection with the  SARS-CoV-2 virus that causes COVID-19. Institutional protocols and algorithms that pertain to the evaluation of patients at risk for COVID-19 are in a state of rapid change based on information released by regulatory bodies including the CDC and federal and state organizations. These policies and algorithms were followed during the patient's care in the ED.   Final Clinical Impressions(s) / ED Diagnoses   Final diagnoses:  Fever, unspecified fever cause  Toe infection    ED Discharge Orders    None       Apoorva Bugay, Ambrose Finland, MD 04/27/19 1851

## 2019-04-27 NOTE — ED Triage Notes (Signed)
Patient BIB by [facility] with complaints of chest pain and body-aches. Patient heart rate 119 and temp: 100.2 on arrival as well. Patient has been treated for MRSA x2 weeks and went to have a toe debridement done a day ago.

## 2019-04-28 ENCOUNTER — Observation Stay (HOSPITAL_COMMUNITY): Payer: No Typology Code available for payment source

## 2019-04-28 DIAGNOSIS — M1990 Unspecified osteoarthritis, unspecified site: Secondary | ICD-10-CM | POA: Diagnosis present

## 2019-04-28 DIAGNOSIS — K219 Gastro-esophageal reflux disease without esophagitis: Secondary | ICD-10-CM | POA: Diagnosis present

## 2019-04-28 DIAGNOSIS — J45909 Unspecified asthma, uncomplicated: Secondary | ICD-10-CM | POA: Diagnosis present

## 2019-04-28 DIAGNOSIS — L03116 Cellulitis of left lower limb: Secondary | ICD-10-CM | POA: Diagnosis present

## 2019-04-28 DIAGNOSIS — E11621 Type 2 diabetes mellitus with foot ulcer: Secondary | ICD-10-CM | POA: Diagnosis present

## 2019-04-28 DIAGNOSIS — G8929 Other chronic pain: Secondary | ICD-10-CM | POA: Diagnosis present

## 2019-04-28 DIAGNOSIS — L03032 Cellulitis of left toe: Secondary | ICD-10-CM | POA: Diagnosis present

## 2019-04-28 DIAGNOSIS — E1169 Type 2 diabetes mellitus with other specified complication: Secondary | ICD-10-CM | POA: Diagnosis present

## 2019-04-28 DIAGNOSIS — L97529 Non-pressure chronic ulcer of other part of left foot with unspecified severity: Secondary | ICD-10-CM | POA: Diagnosis present

## 2019-04-28 DIAGNOSIS — I152 Hypertension secondary to endocrine disorders: Secondary | ICD-10-CM | POA: Diagnosis present

## 2019-04-28 DIAGNOSIS — R3915 Urgency of urination: Secondary | ICD-10-CM | POA: Diagnosis present

## 2019-04-28 DIAGNOSIS — R32 Unspecified urinary incontinence: Secondary | ICD-10-CM | POA: Diagnosis present

## 2019-04-28 DIAGNOSIS — R509 Fever, unspecified: Secondary | ICD-10-CM | POA: Diagnosis present

## 2019-04-28 DIAGNOSIS — M545 Low back pain: Secondary | ICD-10-CM | POA: Diagnosis present

## 2019-04-28 DIAGNOSIS — R7982 Elevated C-reactive protein (CRP): Secondary | ICD-10-CM | POA: Diagnosis present

## 2019-04-28 DIAGNOSIS — F329 Major depressive disorder, single episode, unspecified: Secondary | ICD-10-CM | POA: Diagnosis present

## 2019-04-28 DIAGNOSIS — E1140 Type 2 diabetes mellitus with diabetic neuropathy, unspecified: Secondary | ICD-10-CM | POA: Diagnosis present

## 2019-04-28 DIAGNOSIS — E785 Hyperlipidemia, unspecified: Secondary | ICD-10-CM | POA: Diagnosis present

## 2019-04-28 DIAGNOSIS — Z1159 Encounter for screening for other viral diseases: Secondary | ICD-10-CM | POA: Diagnosis not present

## 2019-04-28 LAB — CBC
HCT: 30.3 % — ABNORMAL LOW (ref 36.0–46.0)
Hemoglobin: 9.6 g/dL — ABNORMAL LOW (ref 12.0–15.0)
MCH: 27.7 pg (ref 26.0–34.0)
MCHC: 31.7 g/dL (ref 30.0–36.0)
MCV: 87.3 fL (ref 80.0–100.0)
Platelets: 140 10*3/uL — ABNORMAL LOW (ref 150–400)
RBC: 3.47 MIL/uL — ABNORMAL LOW (ref 3.87–5.11)
RDW: 14.2 % (ref 11.5–15.5)
WBC: 4.7 10*3/uL (ref 4.0–10.5)
nRBC: 0 % (ref 0.0–0.2)

## 2019-04-28 LAB — URINE CULTURE: Culture: NO GROWTH

## 2019-04-28 LAB — BASIC METABOLIC PANEL
Anion gap: 6 (ref 5–15)
BUN: 13 mg/dL (ref 6–20)
CO2: 20 mmol/L — ABNORMAL LOW (ref 22–32)
Calcium: 7.3 mg/dL — ABNORMAL LOW (ref 8.9–10.3)
Chloride: 115 mmol/L — ABNORMAL HIGH (ref 98–111)
Creatinine, Ser: 0.6 mg/dL (ref 0.44–1.00)
GFR calc Af Amer: >60 mL/min (ref >60)
GFR calc non Af Amer: >60 mL/min (ref >60)
Glucose, Bld: 73 mg/dL (ref 70–99)
Potassium: 3.4 mmol/L — ABNORMAL LOW (ref 3.5–5.1)
Sodium: 141 mmol/L (ref 135–145)

## 2019-04-28 LAB — HIV ANTIBODY (ROUTINE TESTING W REFLEX): HIV Screen 4th Generation wRfx: NONREACTIVE

## 2019-04-28 LAB — GLUCOSE, CAPILLARY
Glucose-Capillary: 79 mg/dL (ref 70–99)
Glucose-Capillary: 86 mg/dL (ref 70–99)
Glucose-Capillary: 61 mg/dL — ABNORMAL LOW (ref 70–99)
Glucose-Capillary: 64 mg/dL — ABNORMAL LOW (ref 70–99)
Glucose-Capillary: 129 mg/dL — ABNORMAL HIGH (ref 70–99)
Glucose-Capillary: 73 mg/dL (ref 70–99)

## 2019-04-28 LAB — SEDIMENTATION RATE: Sed Rate: 13 mm/hr (ref 0–22)

## 2019-04-28 MED ORDER — POTASSIUM CHLORIDE CRYS ER 20 MEQ PO TBCR
40.0000 meq | EXTENDED_RELEASE_TABLET | Freq: Once | ORAL | Status: AC
  Administered 2019-04-28: 40 meq via ORAL
  Filled 2019-04-28: qty 2

## 2019-04-28 MED ORDER — OXYCODONE-ACETAMINOPHEN 5-325 MG PO TABS
1.0000 | ORAL_TABLET | ORAL | Status: DC | PRN
  Administered 2019-04-28 – 2019-05-01 (×16): 1 via ORAL
  Filled 2019-04-28 (×17): qty 1

## 2019-04-28 NOTE — Plan of Care (Signed)
  Problem: Pain Managment: Goal: General experience of comfort will improve Outcome: Progressing   

## 2019-04-28 NOTE — Progress Notes (Signed)
PROGRESS NOTE    Dominique Evans  DXI:[id number] DOB: Jan 29, 1966 DOA: 04/27/2019 PCP: Haywood Pao, MD    Brief Narrative:  Dominique Evans is a 53 y.o. female with medical history significant for type 2 diabetes with neuropathy, asthma, hypertension, hyperlipidemia, GERD, and chronic back pain who presents to the ED for evaluation of subjective fevers, fatigue, and left toe pain.    She was initially seen in the ED on 04/10/2019 after reportedly injuring her right fourth/fifth toes requiring laceration repair.  She was also noted to have erythema of her left second toe during that visit.  X-ray was negative for fracture or dislocation or obvious osteomyelitis.  She was treated with doxycycline which she has been taking continuously up to now.  She was seen by her podiatrist yesterday on 04/26/2019 and underwent debridement of necrotic tissue and hyperkeratotic callus lesions.  She was recommended to continue oral antibiotics for the cellulitis of her left second toe.  She presents today after waking up and feeling unwell with body aches, fevers, continued pain in her toes, and persistent swelling/erythema of her left second toe.  She reports chronic peripheral neuropathy and chronic back pain both of which are unchanged.  She otherwise denies any chest pain, dyspnea, abdominal pain, diarrhea, constipation.  She does report occasional urinary urgency with incontinence ongoing for a few months but denies dysuria.  ED Course: Initial vitals showed BP 108/62, pulse 92, RR 16, temp 100.2 Fahrenheit, SPO2 100% on 2 L supplemental O2 via [place].  Labs are notable for WBC 7.6, hemoglobin 11.1, platelets 181,000, Sodium 138, potassium 3.6, BUN 22, creatinine 0.94, serum glucose 135, i-STAT beta-hCG <5.0, lactic acid 1.5.  Blood cultures were obtained and pending.  SARS-CoV-2 test is negative.  Portable chest x-ray was negative for focal consolidation, effusion, or edema.  X-ray of the left foot  showed stable mild distal soft tissue swelling without obvious evidence of underlying osteomyelitis.  Patient was given 1 L normal saline and started on IV vancomycin, aztreonam, and Flagyl due to penicillin and clindamycin allergies.  The hospitalist service was consulted to admit for further evaluation and management.   Assessment & Plan:   Principal Problem:   Cellulitis of left foot Active Problems:   Type 2 diabetes mellitus with foot ulcer ([place])   Hypertension associated with diabetes ([place])   Chronic pain   Hyperlipidemia   Persistent left second toe cellulitis, failed outpatient antibiotics Has failed outpatient therapy with doxycycline, reports has been on for 2-3 weeks.  Recently seen by her podiatrist status post callus/necrotic wound debridement of her second left toe.  Patient has multiple allergies to penicillins, clindamycin, Cipro that cause anaphylaxis. No other obvious infectious source at this time.  X-ray left foot with mild distal soft tissue swelling without evidence of osteomyelitis.  MRI left toes with findings consistent of cellulitis of the second toe and no evidence of abscess/osteomyelitis.  Afebrile without leukocytosis.  CRP elevated at 3.2 with ESR of 13. --Blood cultures x2: Pending --Continue IV vancomycin and aztreonam given antibiotic allergies  Type 2 diabetes with neuropathy: --sensitive SSI, adjust as needed --Continue home amitriptyline, Lyrica  Asthma: Currently stable. --Continue Advair, Singulair, as needed albuterol  Hypertension: --Continue to hold home losartan and verapamil due to soft blood pressure.   --Continue to monitor blood pressure closely  Hyperlipidemia: --Continue rosuvastatin.  GERD: --Continue PPI.  Chronic back pain: Status post decompressive lumbar laminectomy L4-5 and PLIF L4-5. --Continue home Percocet as needed, Elavil, and Lyrica  Depression: --Continue venlafaxine.   DVT prophylaxis: Heparin Code  Status: Full code Family Communication: None Disposition Plan: Continue inpatient, on IV antibiotics for failed outpatient treatment of left second toe cellulitis with multiple allergies and difficult to manage with oral antibiotics   Consultants:   None  Procedures:   None  Antimicrobials:   Vancomycin 7/9>>  Aztreonam 7/9>>   Subjective: Patient seen and examined at bedside, resting comfortably in bed.  Continues with significant pain and redness to left second toe.  Also upset about her home pain medication regimen not appropriately updated in system.  Continues with chronic low back pain.  No other complaints at this time.  Awaiting MRI of her left lower extremity.  Reports has been on long course of doxycycline outpatient without improvement of her left toe cellulitis.  No other complaints at this time.  Denies headache, no fever/chills/night sweats, no nausea or vomiting/diarrhea, no chest pain, no palpitations, no abdominal pain.  No acute events overnight per nursing staff.  Objective: Vitals:   04/27/19 2110 04/27/19 2322 04/28/19 0430 04/28/19 0924  BP: (!) 97/57 104/62 139/79 (!) 147/82  Pulse: 75 68 76 73  Resp: 18 16 18 18   Temp: 98.9 F (37.2 C) 98 F (36.7 C) 98.6 F (37 C) 98.3 F (36.8 C)  TempSrc: Oral Oral Oral Oral  SpO2: 98% 93% 97% 99%  Weight:      Height:        Intake/Output Summary (Last 24 hours) at 04/28/2019 1254 Last data filed at 04/28/2019 1100 Gross per 24 hour  Intake 2440.99 ml  Output 1000 ml  Net 1440.99 ml   Filed Weights   04/27/19 1619  Weight: 87.5 kg    Examination:  General exam: Appears calm and comfortable  Respiratory system: Clear to auscultation. Respiratory effort normal. Cardiovascular system: S1 & S2 heard, RRR. No JVD, murmurs, rubs, gallops or clicks. No pedal edema. Gastrointestinal system: Abdomen is nondistended, soft and nontender. No organomegaly or masses felt. Normal bowel sounds heard. Central nervous  system: Alert and oriented. No focal neurological deficits. Extremities: Symmetric 5 x 5 power. Skin: No rashes, lesions or ulcers Psychiatry: Judgement and insight appear normal. Mood & affect appropriate.     Data Reviewed: I have personally reviewed following labs and imaging studies  CBC: Recent Labs  Lab 04/27/19 1622 04/27/19 2155 04/28/19 0243  WBC 7.6 6.8 4.7  NEUTROABS 6.5  --   --   HGB 11.1* 10.5* 9.6*  HCT 34.8* 32.6* 30.3*  MCV 85.7 85.6 87.3  PLT 181 178 841*   Basic Metabolic Panel: Recent Labs  Lab 04/27/19 1622 04/27/19 2155 04/28/19 0243  NA 138  --  141  K 3.6  --  3.4*  CL 104  --  115*  CO2 23  --  20*  GLUCOSE 135*  --  73  BUN 22*  --  13  CREATININE 0.94 0.76 0.60  CALCIUM 8.9  --  7.3*   GFR: Estimated Creatinine Clearance: 87.8 mL/min (by C-G formula based on SCr of 0.6 mg/dL). Liver Function Tests: Recent Labs  Lab 04/27/19 1622  AST 26  ALT 22  ALKPHOS 87  BILITOT 0.6  PROT 7.0  ALBUMIN 3.6   No results for input(s): LIPASE, AMYLASE in the last 168 hours. No results for input(s): AMMONIA in the last 168 hours. Coagulation Profile: Recent Labs  Lab 04/27/19 1622  INR 1.1   Cardiac Enzymes: No results for input(s): CKTOTAL, CKMB, CKMBINDEX, TROPONINI in  the last 168 hours. BNP (last 3 results) No results for input(s): PROBNP in the last 8760 hours. HbA1C: No results for input(s): HGBA1C in the last 72 hours. CBG: Recent Labs  Lab 04/27/19 2059 04/28/19 0911 04/28/19 1245  GLUCAP 91 79 86   Lipid Profile: No results for input(s): CHOL, HDL, LDLCALC, TRIG, CHOLHDL, LDLDIRECT in the last 72 hours. Thyroid Function Tests: No results for input(s): TSH, T4TOTAL, FREET4, T3FREE, THYROIDAB in the last 72 hours. Anemia Panel: No results for input(s): VITAMINB12, FOLATE, FERRITIN, TIBC, IRON, RETICCTPCT in the last 72 hours. Sepsis Labs: Recent Labs  Lab 04/27/19 1622 04/27/19 1822  LATICACIDVEN 1.5 1.1    Recent  Results (from the past 240 hour(s))  Blood Culture (routine x 2)     Status: None (Preliminary result)   Collection Time: 04/27/19  4:10 PM   Specimen: BLOOD  Result Value Ref Range Status   Specimen Description BLOOD LEFT ANTECUBITAL  Final   Special Requests   Final    BOTTLES DRAWN AEROBIC AND ANAEROBIC Blood Culture results may not be optimal due to an excessive volume of blood received in culture bottles   Culture   Final    NO GROWTH < 24 HOURS Performed at [facility], [place] [address], [place], [place] [address]    Report Status PENDING  Incomplete  SARS Coronavirus 2 (CEPHEID- Performed in [place] hospital lab), Hosp Order     Status: None   Collection Time: 04/27/19  4:24 PM   Specimen: Nasopharyngeal Swab  Result Value Ref Range Status   SARS Coronavirus 2 NEGATIVE NEGATIVE Final    Comment: (NOTE) If result is NEGATIVE SARS-CoV-2 target nucleic acids are NOT DETECTED. The SARS-CoV-2 RNA is generally detectable in upper and lower  respiratory specimens during the acute phase of infection. The lowest  concentration of SARS-CoV-2 viral copies this assay can detect is 250  copies / mL. A negative result does not preclude SARS-CoV-2 infection  and should not be used as the sole basis for treatment or other  patient management decisions.  A negative result may occur with  improper specimen collection / handling, submission of specimen other  than nasopharyngeal swab, presence of viral mutation(s) within the  areas targeted by this assay, and inadequate number of viral copies  (<250 copies / mL). A negative result must be combined with clinical  observations, patient history, and epidemiological information. If result is POSITIVE SARS-CoV-2 target nucleic acids are DETECTED. The SARS-CoV-2 RNA is generally detectable in upper and lower  respiratory specimens dur ing the acute phase of infection.  Positive  results are indicative of active infection with SARS-CoV-2.   Clinical  correlation with patient history and other diagnostic information is  necessary to determine patient infection status.  Positive results do  not rule out bacterial infection or co-infection with other viruses. If result is PRESUMPTIVE POSTIVE SARS-CoV-2 nucleic acids MAY BE PRESENT.   A presumptive positive result was obtained on the submitted specimen  and confirmed on repeat testing.  While 2019 novel coronavirus  (SARS-CoV-2) nucleic acids may be present in the submitted sample  additional confirmatory testing may be necessary for epidemiological  and / or clinical management purposes  to differentiate between  SARS-CoV-2 and other Sarbecovirus currently known to infect humans.  If clinically indicated additional testing with an alternate test  methodology [phone]) is advised. The SARS-CoV-2 RNA is generally  detectable in upper and lower respiratory sp ecimens during the acute  phase of infection. The expected result is Negative. Fact Sheet for Patients:  StrictlyIdeas.no Fact Sheet for Healthcare Providers: BankingDealers.co.za This test is not yet approved or cleared by the [place] FDA and has been authorized for detection and/or diagnosis of SARS-CoV-2 by FDA under an Emergency Use Authorization (EUA).  This EUA will remain in effect (meaning this test can be used) for the duration of the COVID-19 declaration under Section 564(b)(1) of the Act, 21 U.S.C. section 360bbb-3(b)(1), unless the authorization is terminated or revoked sooner. Performed at [facility], [place] [address] [place], [place] [address]   Blood Culture (routine x 2)     Status: None (Preliminary result)   Collection Time: 04/27/19  4:25 PM   Specimen: BLOOD RIGHT FOREARM  Result Value Ref Range Status   Specimen Description BLOOD RIGHT FOREARM  Final   Special Requests   Final    BOTTLES DRAWN AEROBIC AND ANAEROBIC Blood Culture adequate  volume   Culture   Final    NO GROWTH < 24 HOURS Performed at [facility], [place] [address] [place], [place] [address]    Report Status PENDING  Incomplete  MRSA PCR Screening     Status: None   Collection Time: 04/27/19  9:07 PM   Specimen: Nasopharyngeal  Result Value Ref Range Status   MRSA by PCR NEGATIVE NEGATIVE Final    Comment:        The GeneXpert MRSA Assay (FDA approved for NASAL specimens only), is one component of a comprehensive MRSA colonization surveillance program. It is not intended to diagnose MRSA infection nor to guide or monitor treatment for MRSA infections. Performed at [facility], [place] [address] [place], [place] [address]          Radiology Studies: Mr Dellie Catholic Left Wo Contrast  Result Date: 04/28/2019 CLINICAL DATA:  Infection of the left second toe. Persistent pain, swelling, and erythema. EXAM: MRI OF THE LEFT TOES WITHOUT CONTRAST TECHNIQUE: Multiplanar, multisequence MR imaging of the left toes was performed. No intravenous contrast was administered. COMPARISON:  Radiographs dated 04/27/2019 FINDINGS: Bones/Joint/Cartilage There is no evidence of osteomyelitis or other acute bone abnormality. Minimal arthritic changes at the first MTP joint. No joint effusions. Ligaments Normal. Muscles and Tendons Normal. Soft tissues There is edema in the subcutaneous soft tissues of the second toe consistent with cellulitis. No definable abscess. IMPRESSION: Findings consistent with cellulitis of the second toe. No evidence of abscess or osteomyelitis. Electronically Signed   By: Lorriane Shire M.D.   On: 04/28/2019 09:08   Dg Chest Port 1 View  Result Date: 04/27/2019 CLINICAL DATA:  Chest pain and myalgias. Fever. EXAM: PORTABLE CHEST 1 VIEW COMPARISON:  08/29/2015. FINDINGS: Borderline enlarged cardiac silhouette, accentuated by the portable AP technique and a poor inspiration. Right-sided aortic arch. Clear lungs. Diffuse osteopenia. Stable left upper  abdominal surgical clips and surgical clips in the inferior mediastinum. IMPRESSION: No acute abnormality. Electronically Signed   By: Claudie Revering M.D.   On: 04/27/2019 17:14   Dg Foot Complete Left  Result Date: 04/27/2019 CLINICAL DATA:  Left great and 2nd toe ulcers. Clinical concern for osteomyelitis. Diabetes. EXAM: LEFT FOOT - COMPLETE 3+ VIEW COMPARISON:  04/11/2019 FINDINGS: Stable mild diffuse distal soft tissue swelling. No bone destruction, periosteal reaction or soft tissue gas. Small calcaneal spurs. IMPRESSION: Stable mild distal soft tissue swelling without evidence of underlying osteomyelitis. Electronically Signed   By: Claudie Revering M.D.   On: 04/27/2019  17:17        Scheduled Meds: . amitriptyline  50 mg Oral QHS  . heparin  5,000 Units Subcutaneous Q8H  . insulin aspart  0-9 Units Subcutaneous TID WC  . mometasone-formoterol  2 puff Inhalation BID  . montelukast  10 mg Oral QHS  . pantoprazole  40 mg Oral Daily  . pregabalin  150 mg Oral BID  . rosuvastatin  5 mg Oral q morning - 10a  . venlafaxine XR  75 mg Oral Q breakfast   Continuous Infusions: . aztreonam 1 g (04/28/19 1149)  . vancomycin       LOS: 0 days    Time spent: 36 minutes    Sarahmarie Leavey J [place] DO [facility] Pager [phone]  If 7PM-7AM, please contact night-coverage [email or web address] Password [facility] 04/28/2019, 12:54 PM

## 2019-04-28 NOTE — Progress Notes (Signed)
Hypoglycemic Event  CBG: 61  Treatment: 2 cups apple juice and 1 cup of fruit  Symptoms: asymptomatic  Follow-up CBG: Time:17:45 CBG Result:125  Comments/MD notified: Dr. [place]    Joeph Szatkowski M Markeem Noreen

## 2019-04-29 LAB — MAGNESIUM: Magnesium: 1.9 mg/dL (ref 1.7–2.4)

## 2019-04-29 LAB — HEMOGLOBIN A1C
Hgb A1c MFr Bld: 5.7 % — ABNORMAL HIGH (ref 4.8–5.6)
Mean Plasma Glucose: 117 mg/dL

## 2019-04-29 LAB — CBC
HCT: 33 % — ABNORMAL LOW (ref 36.0–46.0)
Hemoglobin: 10.5 g/dL — ABNORMAL LOW (ref 12.0–15.0)
MCH: 27.8 pg (ref 26.0–34.0)
MCHC: 31.8 g/dL (ref 30.0–36.0)
MCV: 87.3 fL (ref 80.0–100.0)
Platelets: 154 10*3/uL (ref 150–400)
RBC: 3.78 MIL/uL — ABNORMAL LOW (ref 3.87–5.11)
RDW: 14.2 % (ref 11.5–15.5)
WBC: 2.7 10*3/uL — ABNORMAL LOW (ref 4.0–10.5)
nRBC: 0 % (ref 0.0–0.2)

## 2019-04-29 LAB — GLUCOSE, CAPILLARY
Glucose-Capillary: 85 mg/dL (ref 70–99)
Glucose-Capillary: 76 mg/dL (ref 70–99)
Glucose-Capillary: 83 mg/dL (ref 70–99)
Glucose-Capillary: 58 mg/dL — ABNORMAL LOW (ref 70–99)
Glucose-Capillary: 102 mg/dL — ABNORMAL HIGH (ref 70–99)
Glucose-Capillary: 76 mg/dL (ref 70–99)

## 2019-04-29 LAB — BASIC METABOLIC PANEL
Anion gap: 8 (ref 5–15)
BUN: 10 mg/dL (ref 6–20)
CO2: 22 mmol/L (ref 22–32)
Calcium: 8.7 mg/dL — ABNORMAL LOW (ref 8.9–10.3)
Chloride: 109 mmol/L (ref 98–111)
Creatinine, Ser: 0.72 mg/dL (ref 0.44–1.00)
GFR calc Af Amer: >60 mL/min (ref >60)
GFR calc non Af Amer: >60 mL/min (ref >60)
Glucose, Bld: 83 mg/dL (ref 70–99)
Potassium: 3.7 mmol/L (ref 3.5–5.1)
Sodium: 139 mmol/L (ref 135–145)

## 2019-04-29 NOTE — Progress Notes (Signed)
PROGRESS NOTE    Dominique Evans  ZOX:[id number] DOB: [birth year] DOA: 04/27/2019 PCP: Haywood Pao, MD    Brief Narrative:  Dominique Evans is a 53 y.o. female with medical history significant for type 2 diabetes with neuropathy, asthma, hypertension, hyperlipidemia, GERD, and chronic back pain who presents to the ED for evaluation of subjective fevers, fatigue, and left toe pain.    She was initially seen in the ED on 04/10/2019 after reportedly injuring her right fourth/fifth toes requiring laceration repair.  She was also noted to have erythema of her left second toe during that visit.  X-ray was negative for fracture or dislocation or obvious osteomyelitis.  She was treated with doxycycline which she has been taking continuously up to now.  She was seen by her podiatrist yesterday on 04/26/2019 and underwent debridement of necrotic tissue and hyperkeratotic callus lesions.  She was recommended to continue oral antibiotics for the cellulitis of her left second toe.  She presents today after waking up and feeling unwell with body aches, fevers, continued pain in her toes, and persistent swelling/erythema of her left second toe.  She reports chronic peripheral neuropathy and chronic back pain both of which are unchanged.  She otherwise denies any chest pain, dyspnea, abdominal pain, diarrhea, constipation.  She does report occasional urinary urgency with incontinence ongoing for a few months but denies dysuria.  ED Course: Initial vitals showed BP 108/62, pulse 92, RR 16, temp 100.2 Fahrenheit, SPO2 100% on 2 L supplemental O2 via [place].  Labs are notable for WBC 7.6, hemoglobin 11.1, platelets 181,000, Sodium 138, potassium 3.6, BUN 22, creatinine 0.94, serum glucose 135, i-STAT beta-hCG <5.0, lactic acid 1.5.  Blood cultures were obtained and pending.  SARS-CoV-2 test is negative.  Portable chest x-ray was negative for focal consolidation, effusion, or edema.  X-ray of the left foot  showed stable mild distal soft tissue swelling without obvious evidence of underlying osteomyelitis.  Patient was given 1 L normal saline and started on IV vancomycin, aztreonam, and Flagyl due to penicillin and clindamycin allergies.  The hospitalist service was consulted to admit for further evaluation and management.   Assessment & Plan:   Principal Problem:   Cellulitis of left foot Active Problems:   Type 2 diabetes mellitus with foot ulcer ([place])   Hypertension associated with diabetes ([place])   Chronic pain   Hyperlipidemia   Persistent left second toe cellulitis, failed outpatient antibiotics Has failed outpatient therapy with doxycycline, reports has been on for 2-3 weeks.  Recently seen by her podiatrist status post callus/necrotic wound debridement of her second left toe.  Patient has multiple allergies to penicillins, clindamycin, Cipro that cause anaphylaxis. No other obvious infectious source at this time.  X-ray left foot with mild distal soft tissue swelling without evidence of osteomyelitis.  MRI left toes with findings consistent of cellulitis of the second toe and no evidence of abscess/osteomyelitis.  Afebrile without leukocytosis.  CRP elevated at 3.2 with ESR of 13. --Blood cultures x2: NG x 24hrs --Continue IV vancomycin and aztreonam given antibiotic allergies  Type 2 diabetes with neuropathy: Hemoglobin A1c 5.7.  Hypoglycemic event with blood glucose 61 yesterday afternoon, improved with apple juice. --sensitive SSI, continue to monitor CBGs closely --Continue home amitriptyline, Lyrica  Asthma: Currently stable. --Continue Advair, Singulair, as needed albuterol  Hypertension: --Continue to hold home losartan and verapamil due to soft blood pressure.   --Continue to monitor blood pressure closely  Hyperlipidemia: --Continue rosuvastatin.  GERD: --Continue PPI.  Chronic  back pain: Status post decompressive lumbar laminectomy L4-5 and PLIF  L4-5. --Continue home Percocet as needed, Elavil, and Lyrica  Depression: --Continue venlafaxine.   DVT prophylaxis: Heparin Code Status: Full code Family Communication: None Disposition Plan: Continue inpatient, on IV antibiotics for failed outpatient treatment of left second toe cellulitis with multiple allergies and difficult to manage with oral antibiotics   Consultants:   None  Procedures:   None  Antimicrobials:   Vancomycin 7/9>>  Aztreonam 7/9>>   Subjective: Patient seen and examined at bedside, resting comfortably in bed.  Continues with significant pain and redness to left second toe. No other complaints at this time.  Denies headache, no fever/chills/night sweats, no nausea or vomiting/diarrhea, no chest pain, no palpitations, no abdominal pain.  No acute events overnight per nursing staff.  Objective: Vitals:   04/28/19 1944 04/28/19 2009 04/29/19 0404 04/29/19 0821  BP:  135/79 (!) 143/82 (!) 144/75  Pulse:  82 (!) 59 65  Resp:  19 16   Temp:  98.6 F (37 C) 97.6 F (36.4 C) 98.4 F (36.9 C)  TempSrc:  Oral Oral Oral  SpO2: 100% 99% 100% 99%  Weight:      Height:        Intake/Output Summary (Last 24 hours) at 04/29/2019 1208 Last data filed at 04/29/2019 0800 Gross per 24 hour  Intake 820 ml  Output 1 ml  Net 819 ml   Filed Weights   04/27/19 1619  Weight: 87.5 kg    Examination:  General exam: Appears calm and comfortable  Respiratory system: Clear to auscultation. Respiratory effort normal. Cardiovascular system: S1 & S2 heard, RRR. No JVD, murmurs, rubs, gallops or clicks. No pedal edema. Gastrointestinal system: Abdomen is nondistended, soft and nontender. No organomegaly or masses felt. Normal bowel sounds heard. Central nervous system: Alert and oriented. No focal neurological deficits. Extremities: Symmetric 5 x 5 power. Skin: Erythema and edema noted to second toe left foot, no purulent discharge Psychiatry: Judgement and  insight appear normal. Mood & affect appropriate.     Data Reviewed: I have personally reviewed following labs and imaging studies  CBC: Recent Labs  Lab 04/27/19 1622 04/27/19 2155 04/28/19 0243 04/29/19 0236  WBC 7.6 6.8 4.7 2.7*  NEUTROABS 6.5  --   --   --   HGB 11.1* 10.5* 9.6* 10.5*  HCT 34.8* 32.6* 30.3* 33.0*  MCV 85.7 85.6 87.3 87.3  PLT 181 178 140* 696   Basic Metabolic Panel: Recent Labs  Lab 04/27/19 1622 04/27/19 2155 04/28/19 0243 04/29/19 0236  NA 138  --  141 139  K 3.6  --  3.4* 3.7  CL 104  --  115* 109  CO2 23  --  20* 22  GLUCOSE 135*  --  73 83  BUN 22*  --  13 10  CREATININE 0.94 0.76 0.60 0.72  CALCIUM 8.9  --  7.3* 8.7*  MG  --   --   --  1.9   GFR: Estimated Creatinine Clearance: 87.8 mL/min (by C-G formula based on SCr of 0.72 mg/dL). Liver Function Tests: Recent Labs  Lab 04/27/19 1622  AST 26  ALT 22  ALKPHOS 87  BILITOT 0.6  PROT 7.0  ALBUMIN 3.6   No results for input(s): LIPASE, AMYLASE in the last 168 hours. No results for input(s): AMMONIA in the last 168 hours. Coagulation Profile: Recent Labs  Lab 04/27/19 1622  INR 1.1   Cardiac Enzymes: No results for input(s): CKTOTAL, CKMB,  CKMBINDEX, TROPONINI in the last 168 hours. BNP (last 3 results) No results for input(s): PROBNP in the last 8760 hours. HbA1C: Recent Labs    04/27/19 2155  HGBA1C 5.7*   CBG: Recent Labs  Lab 04/28/19 1745 04/28/19 2149 04/29/19 0823 04/29/19 1106 04/29/19 1115  GLUCAP 129* 73 85 76 83   Lipid Profile: No results for input(s): CHOL, HDL, LDLCALC, TRIG, CHOLHDL, LDLDIRECT in the last 72 hours. Thyroid Function Tests: No results for input(s): TSH, T4TOTAL, FREET4, T3FREE, THYROIDAB in the last 72 hours. Anemia Panel: No results for input(s): VITAMINB12, FOLATE, FERRITIN, TIBC, IRON, RETICCTPCT in the last 72 hours. Sepsis Labs: Recent Labs  Lab 04/27/19 1622 04/27/19 1822  LATICACIDVEN 1.5 1.1    Recent Results (from  the past 240 hour(s))  Blood Culture (routine x 2)     Status: None (Preliminary result)   Collection Time: 04/27/19  4:10 PM   Specimen: BLOOD  Result Value Ref Range Status   Specimen Description BLOOD LEFT ANTECUBITAL  Final   Special Requests   Final    BOTTLES DRAWN AEROBIC AND ANAEROBIC Blood Culture results may not be optimal due to an excessive volume of blood received in culture bottles   Culture   Final    NO GROWTH < 24 HOURS Performed at [facility], [place] [address] [place],  [address]    Report Status PENDING  Incomplete  SARS Coronavirus 2 (CEPHEID- Performed in [place] hospital lab), Hosp Order     Status: None   Collection Time: 04/27/19  4:24 PM   Specimen: Nasopharyngeal Swab  Result Value Ref Range Status   SARS Coronavirus 2 NEGATIVE NEGATIVE Final    Comment: (NOTE) If result is NEGATIVE SARS-CoV-2 target nucleic acids are NOT DETECTED. The SARS-CoV-2 RNA is generally detectable in upper and lower  respiratory specimens during the acute phase of infection. The lowest  concentration of SARS-CoV-2 viral copies this assay can detect is 250  copies / mL. A negative result does not preclude SARS-CoV-2 infection  and should not be used as the sole basis for treatment or other  patient management decisions.  A negative result may occur with  improper specimen collection / handling, submission of specimen other  than nasopharyngeal swab, presence of viral mutation(s) within the  areas targeted by this assay, and inadequate number of viral copies  (<250 copies / mL). A negative result must be combined with clinical  observations, patient history, and epidemiological information. If result is POSITIVE SARS-CoV-2 target nucleic acids are DETECTED. The SARS-CoV-2 RNA is generally detectable in upper and lower  respiratory specimens dur ing the acute phase of infection.  Positive  results are indicative of active infection with SARS-CoV-2.  Clinical   correlation with patient history and other diagnostic information is  necessary to determine patient infection status.  Positive results do  not rule out bacterial infection or co-infection with other viruses. If result is PRESUMPTIVE POSTIVE SARS-CoV-2 nucleic acids MAY BE PRESENT.   A presumptive positive result was obtained on the submitted specimen  and confirmed on repeat testing.  While 2019 novel coronavirus  (SARS-CoV-2) nucleic acids may be present in the submitted sample  additional confirmatory testing may be necessary for epidemiological  and / or clinical management purposes  to differentiate between  SARS-CoV-2 and other Sarbecovirus currently known to infect humans.  If clinically indicated additional testing with an alternate test  methodology [id number]) is advised. The SARS-CoV-2 RNA is generally  detectable  in upper and lower respiratory sp ecimens during the acute  phase of infection. The expected result is Negative. Fact Sheet for Patients:  StrictlyIdeas.no Fact Sheet for Healthcare Providers: BankingDealers.co.za This test is not yet approved or cleared by the [place] FDA and has been authorized for detection and/or diagnosis of SARS-CoV-2 by FDA under an Emergency Use Authorization (EUA).  This EUA will remain in effect (meaning this test can be used) for the duration of the COVID-19 declaration under Section 564(b)(1) of the Act, 21 U.S.C. section 360bbb-3(b)(1), unless the authorization is terminated or revoked sooner. Performed at [facility], [place] [address], [place], [place] [address]   Blood Culture (routine x 2)     Status: None (Preliminary result)   Collection Time: 04/27/19  4:25 PM   Specimen: BLOOD RIGHT FOREARM  Result Value Ref Range Status   Specimen Description BLOOD RIGHT FOREARM  Final   Special Requests   Final    BOTTLES DRAWN AEROBIC AND ANAEROBIC Blood Culture adequate volume    Culture   Final    NO GROWTH < 24 HOURS Performed at [facility], [place] [address], [place], [place] [address]    Report Status PENDING  Incomplete  MRSA PCR Screening     Status: None   Collection Time: 04/27/19  9:07 PM   Specimen: Nasopharyngeal  Result Value Ref Range Status   MRSA by PCR NEGATIVE NEGATIVE Final    Comment:        The GeneXpert MRSA Assay (FDA approved for NASAL specimens only), is one component of a comprehensive MRSA colonization surveillance program. It is not intended to diagnose MRSA infection nor to guide or monitor treatment for MRSA infections. Performed at [facility], [place] [address], [place], [place] [address]   Urine culture     Status: None   Collection Time: 04/27/19  9:58 PM   Specimen: In/Out Cath Urine  Result Value Ref Range Status   Specimen Description IN/OUT CATH URINE  Final   Special Requests NONE  Final   Culture   Final    NO GROWTH Performed at [facility], [place] [address], [place], [place] [address]    Report Status 04/28/2019 FINAL  Final         Radiology Studies: Mr Toes Left Wo Contrast  Result Date: 04/28/2019 CLINICAL DATA:  Infection of the left second toe. Persistent pain, swelling, and erythema. EXAM: MRI OF THE LEFT TOES WITHOUT CONTRAST TECHNIQUE: Multiplanar, multisequence MR imaging of the left toes was performed. No intravenous contrast was administered. COMPARISON:  Radiographs dated 04/27/2019 FINDINGS: Bones/Joint/Cartilage There is no evidence of osteomyelitis or other acute bone abnormality. Minimal arthritic changes at the first MTP joint. No joint effusions. Ligaments Normal. Muscles and Tendons Normal. Soft tissues There is edema in the subcutaneous soft tissues of the second toe consistent with cellulitis. No definable abscess. IMPRESSION: Findings consistent with cellulitis of the second toe. No evidence of abscess or osteomyelitis. Electronically Signed   By: Lorriane Shire M.D.   On:  04/28/2019 09:08   Dg Chest Port 1 View  Result Date: 04/27/2019 CLINICAL DATA:  Chest pain and myalgias. Fever. EXAM: PORTABLE CHEST 1 VIEW COMPARISON:  08/29/2015. FINDINGS: Borderline enlarged cardiac silhouette, accentuated by the portable AP technique and a poor inspiration. Right-sided aortic arch. Clear lungs. Diffuse osteopenia. Stable left upper abdominal surgical clips and surgical clips in the inferior mediastinum. IMPRESSION: No acute abnormality. Electronically Signed  By: Claudie Revering M.D.   On: 04/27/2019 17:14   Dg Foot Complete Left  Result Date: 04/27/2019 CLINICAL DATA:  Left great and 2nd toe ulcers. Clinical concern for osteomyelitis. Diabetes. EXAM: LEFT FOOT - COMPLETE 3+ VIEW COMPARISON:  04/11/2019 FINDINGS: Stable mild diffuse distal soft tissue swelling. No bone destruction, periosteal reaction or soft tissue gas. Small calcaneal spurs. IMPRESSION: Stable mild distal soft tissue swelling without evidence of underlying osteomyelitis. Electronically Signed   By: Claudie Revering M.D.   On: 04/27/2019 17:17        Scheduled Meds:  amitriptyline  50 mg Oral QHS   heparin  5,000 Units Subcutaneous Q8H   insulin aspart  0-9 Units Subcutaneous TID WC   mometasone-formoterol  2 puff Inhalation BID   montelukast  10 mg Oral QHS   pantoprazole  40 mg Oral Daily   pregabalin  150 mg Oral BID   rosuvastatin  5 mg Oral q morning - 10a   venlafaxine XR  75 mg Oral Q breakfast   Continuous Infusions:  aztreonam 1 g (04/29/19 1008)   vancomycin 1,250 mg (04/28/19 1627)     LOS: 1 day    Time spent: 36 minutes    Sonu Kruckenberg J [place] DO [facility] Pager [phone]  If 7PM-7AM, please contact night-coverage [email or web address] Password [facility] 04/29/2019, 12:08 PM

## 2019-04-29 NOTE — Progress Notes (Signed)
Patient is alert and oriented, Skin warm and dry, color normal.  No distress noted.  The patient called out to the Nursing desk and reported feeling lightheaded and requested to have her Blood glucose checked- results 86

## 2019-04-29 NOTE — Plan of Care (Signed)
  Problem: Pain Managment: Goal: General experience of comfort will improve Outcome: Progressing   Problem: Safety: Goal: Ability to remain free from injury will improve Outcome: Progressing   Problem: Skin Integrity: Goal: Risk for impaired skin integrity will decrease Outcome: Progressing   

## 2019-04-30 LAB — GLUCOSE, CAPILLARY
Glucose-Capillary: 66 mg/dL — ABNORMAL LOW (ref 70–99)
Glucose-Capillary: 70 mg/dL (ref 70–99)
Glucose-Capillary: 89 mg/dL (ref 70–99)
Glucose-Capillary: 78 mg/dL (ref 70–99)

## 2019-04-30 NOTE — Progress Notes (Signed)
PROGRESS NOTE    Dominique Evans  LTJ:[id number] DOB: 1966/06/15 DOA: 04/27/2019 PCP: Haywood Pao, MD    Brief Narrative:  Dominique Evans is a 52 y.o. female with medical history significant for type 2 diabetes with neuropathy, asthma, hypertension, hyperlipidemia, GERD, and chronic back pain who presents to the ED for evaluation of subjective fevers, fatigue, and left toe pain.    She was initially seen in the ED on 04/10/2019 after reportedly injuring her right fourth/fifth toes requiring laceration repair.  She was also noted to have erythema of her left second toe during that visit.  X-ray was negative for fracture or dislocation or obvious osteomyelitis.  She was treated with doxycycline which she has been taking continuously up to now.  She was seen by her podiatrist yesterday on 04/26/2019 and underwent debridement of necrotic tissue and hyperkeratotic callus lesions.  She was recommended to continue oral antibiotics for the cellulitis of her left second toe.  She presents today after waking up and feeling unwell with body aches, fevers, continued pain in her toes, and persistent swelling/erythema of her left second toe.  She reports chronic peripheral neuropathy and chronic back pain both of which are unchanged.  She otherwise denies any chest pain, dyspnea, abdominal pain, diarrhea, constipation.  She does report occasional urinary urgency with incontinence ongoing for a few months but denies dysuria.  ED Course: Initial vitals showed BP 108/62, pulse 92, RR 16, temp 100.2 Fahrenheit, SPO2 100% on 2 L supplemental O2 via [place].  Labs are notable for WBC 7.6, hemoglobin 11.1, platelets 181,000, Sodium 138, potassium 3.6, BUN 22, creatinine 0.94, serum glucose 135, i-STAT beta-hCG <5.0, lactic acid 1.5.  Blood cultures were obtained and pending.  SARS-CoV-2 test is negative.  Portable chest x-ray was negative for focal consolidation, effusion, or edema.  X-ray of the left foot  showed stable mild distal soft tissue swelling without obvious evidence of underlying osteomyelitis.  Patient was given 1 L normal saline and started on IV vancomycin, aztreonam, and Flagyl due to penicillin and clindamycin allergies.  The hospitalist service was consulted to admit for further evaluation and management.   Assessment & Plan:   Principal Problem:   Cellulitis of left foot Active Problems:   Type 2 diabetes mellitus with foot ulcer ([place])   Hypertension associated with diabetes ([place])   Chronic pain   Hyperlipidemia   Persistent left second toe cellulitis, failed outpatient antibiotics Has failed outpatient therapy with doxycycline, reports has been on for 2-3 weeks.  Recently seen by her podiatrist status post callus/necrotic wound debridement of her second left toe.  Patient has multiple allergies to penicillins, clindamycin, Cipro that cause anaphylaxis. No other obvious infectious source at this time.  X-ray left foot with mild distal soft tissue swelling without evidence of osteomyelitis.  MRI left toes with findings consistent of cellulitis of the second toe and no evidence of abscess/osteomyelitis.  Afebrile without leukocytosis.  CRP elevated at 3.2 with ESR of 13. --Blood cultures x2: NG x 2 days --Continue IV vancomycin and aztreonam given antibiotic allergies  Type 2 diabetes with neuropathy: Hemoglobin A1c 5.7.  Hypoglycemic event with blood glucose 61 on 7/10, improved with apple juice. --continue to monitor CBGs closely --Continue home amitriptyline, Lyrica --regular diet  Asthma: Currently stable. --Continue Advair, Singulair, as needed albuterol  Hypertension: --Continue to hold home losartan and verapamil due to soft blood pressure.   --Continue to monitor blood pressure closely  Hyperlipidemia: --Continue rosuvastatin.  GERD: --Continue PPI.  Chronic back pain: Status post decompressive lumbar laminectomy L4-5 and PLIF L4-5. --Continue  home Percocet as needed, Elavil, and Lyrica  Depression: --Continue venlafaxine.   DVT prophylaxis: Heparin Code Status: Full code Family Communication: None Disposition Plan: Continue inpatient, on IV antibiotics for failed outpatient treatment of left second toe cellulitis with multiple allergies and difficult to manage with oral antibiotics, likely d/c home 7/13   Consultants:   None  Procedures:   None  Antimicrobials:   Vancomycin 7/9>>  Aztreonam 7/9>>   Subjective: Patient seen and examined at bedside, resting comfortably in bed.  Continues with left second toe pain and erythema, redness improved. No other complaints at this time.  Denies headache, no fever/chills/night sweats, no nausea or vomiting/diarrhea, no chest pain, no palpitations, no abdominal pain.  No acute events overnight per nursing staff.  Objective: Vitals:   04/29/19 1937 04/29/19 1956 04/30/19 0326 04/30/19 0802  BP:  137/76 (!) 148/82 (!) 154/80  Pulse:  (!) 55 62 60  Resp:  16 17   Temp:  98.1 F (36.7 C) (!) 97.5 F (36.4 C) 98.4 F (36.9 C)  TempSrc:  Oral Oral Oral  SpO2: 99% 99% 98% 100%  Weight:      Height:        Intake/Output Summary (Last 24 hours) at 04/30/2019 1144 Last data filed at 04/30/2019 0800 Gross per 24 hour  Intake 380 ml  Output --  Net 380 ml   Filed Weights   04/27/19 1619  Weight: 87.5 kg    Examination:  General exam: Appears calm and comfortable  Respiratory system: Clear to auscultation. Respiratory effort normal. Cardiovascular system: S1 & S2 heard, RRR. No JVD, murmurs, rubs, gallops or clicks. No pedal edema. Gastrointestinal system: Abdomen is nondistended, soft and nontender. No organomegaly or masses felt. Normal bowel sounds heard. Central nervous system: Alert and oriented. No focal neurological deficits. Extremities: Symmetric 5 x 5 power. Skin: Erythema and edema noted to second toe left foot, improved; no purulent  discharge Psychiatry: Judgement and insight appear normal. Mood & affect appropriate.     Data Reviewed: I have personally reviewed following labs and imaging studies  CBC: Recent Labs  Lab 04/27/19 1622 04/27/19 2155 04/28/19 0243 04/29/19 0236  WBC 7.6 6.8 4.7 2.7*  NEUTROABS 6.5  --   --   --   HGB 11.1* 10.5* 9.6* 10.5*  HCT 34.8* 32.6* 30.3* 33.0*  MCV 85.7 85.6 87.3 87.3  PLT 181 178 140* 793   Basic Metabolic Panel: Recent Labs  Lab 04/27/19 1622 04/27/19 2155 04/28/19 0243 04/29/19 0236  NA 138  --  141 139  K 3.6  --  3.4* 3.7  CL 104  --  115* 109  CO2 23  --  20* 22  GLUCOSE 135*  --  73 83  BUN 22*  --  13 10  CREATININE 0.94 0.76 0.60 0.72  CALCIUM 8.9  --  7.3* 8.7*  MG  --   --   --  1.9   GFR: Estimated Creatinine Clearance: 87.8 mL/min (by C-G formula based on SCr of 0.72 mg/dL). Liver Function Tests: Recent Labs  Lab 04/27/19 1622  AST 26  ALT 22  ALKPHOS 87  BILITOT 0.6  PROT 7.0  ALBUMIN 3.6   No results for input(s): LIPASE, AMYLASE in the last 168 hours. No results for input(s): AMMONIA in the last 168 hours. Coagulation Profile: Recent Labs  Lab 04/27/19 1622  INR 1.1   Cardiac Enzymes:  No results for input(s): CKTOTAL, CKMB, CKMBINDEX, TROPONINI in the last 168 hours. BNP (last 3 results) No results for input(s): PROBNP in the last 8760 hours. HbA1C: Recent Labs    04/27/19 2155  HGBA1C 5.7*   CBG: Recent Labs  Lab 04/29/19 1115 04/29/19 1702 04/29/19 1840 04/29/19 2044 04/30/19 0803  GLUCAP 83 58* 102* 76 66*   Lipid Profile: No results for input(s): CHOL, HDL, LDLCALC, TRIG, CHOLHDL, LDLDIRECT in the last 72 hours. Thyroid Function Tests: No results for input(s): TSH, T4TOTAL, FREET4, T3FREE, THYROIDAB in the last 72 hours. Anemia Panel: No results for input(s): VITAMINB12, FOLATE, FERRITIN, TIBC, IRON, RETICCTPCT in the last 72 hours. Sepsis Labs: Recent Labs  Lab 04/27/19 1622 04/27/19 1822   LATICACIDVEN 1.5 1.1    Recent Results (from the past 240 hour(s))  Blood Culture (routine x 2)     Status: None (Preliminary result)   Collection Time: 04/27/19  4:10 PM   Specimen: BLOOD  Result Value Ref Range Status   Specimen Description BLOOD LEFT ANTECUBITAL  Final   Special Requests   Final    BOTTLES DRAWN AEROBIC AND ANAEROBIC Blood Culture results may not be optimal due to an excessive volume of blood received in culture bottles   Culture   Final    NO GROWTH 2 DAYS Performed at [facility], [place] [address] [place], [place] [address]    Report Status PENDING  Incomplete  SARS Coronavirus 2 (CEPHEID- Performed in [place] hospital lab), Hosp Order     Status: None   Collection Time: 04/27/19  4:24 PM   Specimen: Nasopharyngeal Swab  Result Value Ref Range Status   SARS Coronavirus 2 NEGATIVE NEGATIVE Final    Comment: (NOTE) If result is NEGATIVE SARS-CoV-2 target nucleic acids are NOT DETECTED. The SARS-CoV-2 RNA is generally detectable in upper and lower  respiratory specimens during the acute phase of infection. The lowest  concentration of SARS-CoV-2 viral copies this assay can detect is 250  copies / mL. A negative result does not preclude SARS-CoV-2 infection  and should not be used as the sole basis for treatment or other  patient management decisions.  A negative result may occur with  improper specimen collection / handling, submission of specimen other  than nasopharyngeal swab, presence of viral mutation(s) within the  areas targeted by this assay, and inadequate number of viral copies  (<250 copies / mL). A negative result must be combined with clinical  observations, patient history, and epidemiological information. If result is POSITIVE SARS-CoV-2 target nucleic acids are DETECTED. The SARS-CoV-2 RNA is generally detectable in upper and lower  respiratory specimens dur ing the acute phase of infection.  Positive  results are indicative of  active infection with SARS-CoV-2.  Clinical  correlation with patient history and other diagnostic information is  necessary to determine patient infection status.  Positive results do  not rule out bacterial infection or co-infection with other viruses. If result is PRESUMPTIVE POSTIVE SARS-CoV-2 nucleic acids MAY BE PRESENT.   A presumptive positive result was obtained on the submitted specimen  and confirmed on repeat testing.  While 2019 novel coronavirus  (SARS-CoV-2) nucleic acids may be present in the submitted sample  additional confirmatory testing may be necessary for epidemiological  and / or clinical management purposes  to differentiate between  SARS-CoV-2 and other Sarbecovirus currently known to infect humans.  If clinically indicated additional testing with an alternate test  methodology [phone]) is advised. The SARS-CoV-2  RNA is generally  detectable in upper and lower respiratory sp ecimens during the acute  phase of infection. The expected result is Negative. Fact Sheet for Patients:  StrictlyIdeas.no Fact Sheet for Healthcare Providers: BankingDealers.co.za This test is not yet approved or cleared by the [place] FDA and has been authorized for detection and/or diagnosis of SARS-CoV-2 by FDA under an Emergency Use Authorization (EUA).  This EUA will remain in effect (meaning this test can be used) for the duration of the COVID-19 declaration under Section 564(b)(1) of the Act, 21 U.S.C. section 360bbb-3(b)(1), unless the authorization is terminated or revoked sooner. Performed at [facility], [place] [address], [place], [place] [address]   Blood Culture (routine x 2)     Status: None (Preliminary result)   Collection Time: 04/27/19  4:25 PM   Specimen: BLOOD RIGHT FOREARM  Result Value Ref Range Status   Specimen Description BLOOD RIGHT FOREARM  Final   Special Requests   Final    BOTTLES DRAWN AEROBIC  AND ANAEROBIC Blood Culture adequate volume   Culture   Final    NO GROWTH 2 DAYS Performed at [facility], [place] [address], [place], [place] [address]    Report Status PENDING  Incomplete  MRSA PCR Screening     Status: None   Collection Time: 04/27/19  9:07 PM   Specimen: Nasopharyngeal  Result Value Ref Range Status   MRSA by PCR NEGATIVE NEGATIVE Final    Comment:        The GeneXpert MRSA Assay (FDA approved for NASAL specimens only), is one component of a comprehensive MRSA colonization surveillance program. It is not intended to diagnose MRSA infection nor to guide or monitor treatment for MRSA infections. Performed at [facility], [place] [address], [place], [place] [address]   Urine culture     Status: None   Collection Time: 04/27/19  9:58 PM   Specimen: In/Out Cath Urine  Result Value Ref Range Status   Specimen Description IN/OUT CATH URINE  Final   Special Requests NONE  Final   Culture   Final    NO GROWTH Performed at [facility], [place] [address] [place], [place] [address]    Report Status 04/28/2019 FINAL  Final         Radiology Studies: No results found.      Scheduled Meds:  amitriptyline  50 mg Oral QHS   heparin  5,000 Units Subcutaneous Q8H   mometasone-formoterol  2 puff Inhalation BID   montelukast  10 mg Oral QHS   pantoprazole  40 mg Oral Daily   pregabalin  150 mg Oral BID   rosuvastatin  5 mg Oral q morning - 10a   venlafaxine XR  75 mg Oral Q breakfast   Continuous Infusions:  aztreonam 1 g (04/30/19 0851)   vancomycin 1,250 mg (04/29/19 1641)     LOS: 2 days    Time spent: 36 minutes    Hassani Sliney J [place] DO [facility] Pager [phone]  If 7PM-7AM, please contact night-coverage [email or web address] Password [facility] 04/30/2019, 11:44 AM

## 2019-04-30 NOTE — Progress Notes (Signed)
Pharmacy Antibiotic Note  Dominique Evans is a 53 y.o. female admitted on 04/27/2019 with sepsis.  Pharmacy has been consulted for aztreonam and vancomycin dosing. Pt has been treated for MRSA for the last 2 weeks and is s/p toe debridement 04/26/19. Pt has hx of anaphylaxis to PCNs and no hx of cephalosporin use. Tmax 100.2 and WBC 7.6. Renal function is at baseline.  Plan: Aztreonam 2g IV x1 then aztreonam 1g IV q8h Vancomycin 1.75g IV x1 then vancomycin 1.25g IV q24h Monitor clinical progression and vancomycin levels as indicated/needed No growth cultures  Height: 5\' 5"  (165.1 cm) Weight: 193 lb (87.5 kg) IBW/kg (Calculated) : 57  Temp (24hrs), Avg:98.2 F (36.8 C), Min:97.5 F (36.4 C), Max:98.7 F (37.1 C)  Recent Labs  Lab 04/27/19 1622 04/27/19 1822 04/27/19 2155 04/28/19 0243 04/29/19 0236  WBC 7.6  --  6.8 4.7 2.7*  CREATININE 0.94  --  0.76 0.60 0.72  LATICACIDVEN 1.5 1.1  --   --   --     Estimated Creatinine Clearance: 87.8 mL/min (by C-G formula based on SCr of 0.72 mg/dL).    Allergies  Allergen Reactions  . Penicillins Anaphylaxis    Has patient had a PCN reaction causing immediate rash, facial/tongue/throat swelling, SOB or lightheadedness with hypotension: yes Has patient had a PCN reaction causing severe rash involving mucus membranes or skin necrosis: unknown Has patient had a PCN reaction that required hospitalization unknown Has patient had a PCN reaction occurring within the last 10 years: no If all of the above answers are "NO", then may proceed with Cephalosporin use.   . Sulfonamide Derivatives Anaphylaxis  . Clindamycin/Lincomycin Nausea And Vomiting  . Ciprofloxacin Hives    Antimicrobials this admission: Azactam 7/9 >>  Vancomycin 7/9 >> Metronidazole x1 7/9   Dose adjustments this admission: n/a  Microbiology results: 7/9 UCx: ng-final (I/O cath) 7/9 MRSA PCR: neg 7/9 BCx: ngtd  Thank you for allowing pharmacy to be a part of this  patient's care.  Minda Ditto PharmD [phone] 04/30/2019 11:12 AM

## 2019-04-30 NOTE — Plan of Care (Signed)

## 2019-04-30 NOTE — Progress Notes (Signed)
Blood glucose via finger stick was 66.  Denies any type of low glucose symptoms.  The patient was given supplement until her food tray is here.

## 2019-05-01 LAB — BLOOD CULTURE ID PANEL (REFLEXED)

## 2019-05-01 LAB — GLUCOSE, CAPILLARY
Glucose-Capillary: 77 mg/dL (ref 70–99)
Glucose-Capillary: 60 mg/dL — ABNORMAL LOW (ref 70–99)
Glucose-Capillary: 82 mg/dL (ref 70–99)

## 2019-05-01 MED ORDER — CEPHALEXIN 500 MG PO CAPS: 500.0000 mg | ORAL_CAPSULE | Freq: Two times a day (BID) | ORAL | 0 refills | Status: AC

## 2019-05-01 NOTE — Plan of Care (Signed)

## 2019-05-01 NOTE — Progress Notes (Signed)
CBG 12:09=60, Pt asymptomatic, given supplement drink CBG 12:21= 82. MD notified.

## 2019-05-01 NOTE — Progress Notes (Signed)
PHARMACY - PHYSICIAN COMMUNICATION CRITICAL VALUE ALERT - BLOOD CULTURE IDENTIFICATION (BCID)  Dominique Evans is an 53 y.o. female who presented to [facility] on 04/27/2019 with a chief complaint of fever and foot pain.  Has been on vancomycin and aztreonam for cellulitis.  Assessment:  After patient was discharged one of her blood cultures turned positive for GNR in an anaerobic bottle.  Patient improving on vancomycin and aztreonam.  Discussed with Dr. [place] - he will monitor her cultures and follow up with her in a few days when micro results are finalized.  Name of physician (or Provider) Contacted: Dr. [place]  Current antibiotics: cephalexin (patient is discharged)  Changes to prescribed antibiotics recommended:  Patient is on recommended antibiotics - No changes needed  Results for orders placed or performed during the hospital encounter of 04/27/19  Blood Culture ID Panel (Reflexed) (Collected: 04/27/2019  4:25 PM)  Result Value Ref Range   Enterococcus species NOT DETECTED NOT DETECTED   Listeria monocytogenes NOT DETECTED NOT DETECTED   Staphylococcus species NOT DETECTED NOT DETECTED   Staphylococcus aureus (BCID) NOT DETECTED NOT DETECTED   Streptococcus species NOT DETECTED NOT DETECTED   Streptococcus agalactiae NOT DETECTED NOT DETECTED   Streptococcus pneumoniae NOT DETECTED NOT DETECTED   Streptococcus pyogenes NOT DETECTED NOT DETECTED   Acinetobacter baumannii NOT DETECTED NOT DETECTED   Enterobacteriaceae species NOT DETECTED NOT DETECTED   Enterobacter cloacae complex NOT DETECTED NOT DETECTED   Escherichia coli NOT DETECTED NOT DETECTED   Klebsiella oxytoca NOT DETECTED NOT DETECTED   Klebsiella pneumoniae NOT DETECTED NOT DETECTED   Proteus species NOT DETECTED NOT DETECTED   Serratia marcescens NOT DETECTED NOT DETECTED   Haemophilus influenzae NOT DETECTED NOT DETECTED   Neisseria meningitidis NOT DETECTED NOT DETECTED   Pseudomonas aeruginosa NOT DETECTED  NOT DETECTED   Candida albicans NOT DETECTED NOT DETECTED   Candida glabrata NOT DETECTED NOT DETECTED   Candida krusei NOT DETECTED NOT DETECTED   Candida parapsilosis NOT DETECTED NOT DETECTED   Candida tropicalis NOT DETECTED NOT DETECTED    Candie Mile 05/01/2019  2:51 PM

## 2019-05-01 NOTE — Discharge Summary (Signed)
Physician Discharge Summary  Dominique Evans EAV:[id number] DOB: 02/01/1966 DOA: 04/27/2019  PCP: Haywood Pao, MD  Admit date: 04/27/2019 Discharge date: 05/01/2019  Admitted From: Home Disposition:  Home  Recommendations for Outpatient Follow-up:  1. Follow up with PCP in 1 week 2. Follow-up with podiatry on 05/03/2019 3. Please obtain BMP/CBC in one week 4. Please follow up on the following pending results:  Home Health: none Equipment/Devices: none  Discharge Condition: CODE STATUS: FULL CODE Diet recommendation: Heart Healthy / Carb Modified   History of Present Illness:  Dominique Evans a 53 y.o.femalewith medical history significant fortype 2 diabetes with neuropathy, asthma, hypertension, hyperlipidemia, GERD, and chronic back pain who presents to the ED forevaluation of subjective fevers, fatigue, and left toe pain.   She was initially seen in the ED on 04/10/2019 after reportedly injuring her right fourth/fifth toes requiring laceration repair. She was also noted to have erythema of her left second toe during that visit. X-ray was negative for fracture or dislocation or obvious osteomyelitis. She was treated with doxycycline which she has been taking continuously up to now. She was seen by her podiatrist yesterday on 04/26/2019 and underwent debridement of necrotic tissue and hyperkeratotic callus lesions. She was recommended to continue oral antibiotics for the cellulitis of her left second toe.  She presents today after waking up and feeling unwell with body aches, fevers, continued pain in her toes, and persistent swelling/erythema of her left second toe. She reports chronic peripheral neuropathy and chronic back pain both of which are unchanged. She otherwise denies any chest pain, dyspnea, abdominal pain, diarrhea, constipation. She does report occasional urinary urgency with incontinence ongoing for a few months but denies dysuria.  ED Course:Initial vitals  showed BP 108/62, pulse 92, RR 16, temp 100.2 Fahrenheit, SPO2 100% on 2 L supplemental O2 via [place].  Labs are notable for WBC 7.6, hemoglobin 11.1, platelets 181,000,Sodium 138, potassium 3.6, BUN 22, creatinine 0.94, serum glucose 135, i-STAT beta-hCG <5.0, lactic acid 1.5.  Blood cultures were obtained and pending. SARS-CoV-2 test is negative.  Portable chest x-ray was negative for focal consolidation, effusion, or edema.  X-ray of the left foot showed stable mild distal soft tissue swelling without obvious evidence of underlying osteomyelitis.  Patient was given 1 L normal saline and started on IV vancomycin, aztreonam, and Flagyl due to penicillin and clindamycin allergies. The hospitalist service was consulted to admit for further evaluation and management.  Hospital course:  Persistent left second toe cellulitis, failed outpatient antibiotics Has failed outpatient therapy with doxycycline, reports has been on for 2-3 weeks.  Recently seen by her podiatrist status post callus/necrotic wound debridement of her second left toe.  Patient has multiple allergies to penicillins, clindamycin, Cipro that cause anaphylaxis.No other obvious infectious source at this time.  X-ray left foot with mild distal soft tissue swelling without evidence of osteomyelitis.  MRI left toes with findings consistent of cellulitis of the second toe and no evidence of abscess/osteomyelitis.  Afebrile without leukocytosis.  CRP elevated at 3.2 with ESR of 13.  Patient was started on IV vancomycin and aztreonam given her antibiotic allergies.  Patient will continue cephalexin 500 mg p.o. twice daily for an additional 10 days following discharge.  Patient to follow-up with her PCP and podiatry in 1 week.  Type 2 diabetes with neuropathy: Hemoglobin A1c 5.7.    Diet controlled. Continue home amitriptyline, Lyrica  Asthma: Currently stable. Continue Advair, Singulair, as needed albuterol  Hypertension: Continue  home losartan and  verapamil  Hyperlipidemia:Continue rosuvastatin.  GERD: Continue PPI  Chronic back pain: Status post decompressive lumbar laminectomy L4-5 and PLIF L4-5. Continue home Percocet as needed, Elavil, and Lyrica  Depression: --Continue venlafaxine.  Discharge Diagnoses:  Principal Problem:   Cellulitis of left foot Active Problems:   Type 2 diabetes mellitus with foot ulcer ([place])   Hypertension associated with diabetes ([place])   Chronic pain   Hyperlipidemia    Discharge Instructions  Discharge Instructions    Call MD for:  difficulty breathing, headache or visual disturbances   Complete by: As directed    Call MD for:  extreme fatigue   Complete by: As directed    Call MD for:  persistant dizziness or light-headedness   Complete by: As directed    Call MD for:  severe uncontrolled pain   Complete by: As directed    Call MD for:  temperature >100.4   Complete by: As directed    Diet - low sodium heart healthy   Complete by: As directed    Increase activity slowly   Complete by: As directed      Allergies as of 05/01/2019      Reactions   Penicillins Anaphylaxis   Has patient had a PCN reaction causing immediate rash, facial/tongue/throat swelling, SOB or lightheadedness with hypotension: yes Has patient had a PCN reaction causing severe rash involving mucus membranes or skin necrosis: unknown Has patient had a PCN reaction that required hospitalization unknown Has patient had a PCN reaction occurring within the last 10 years: no If all of the above answers are "NO", then may proceed with Cephalosporin use.   Sulfonamide Derivatives Anaphylaxis   Clindamycin/lincomycin Nausea And Vomiting   Ciprofloxacin Hives      Medication List    STOP taking these medications   doxycycline 100 MG capsule Commonly known as: VIBRAMYCIN   rifampin 150 MG capsule Commonly known as: RIFADIN     TAKE these medications   Advair Diskus 250-50 MCG/DOSE  Aepb Generic drug: Fluticasone-Salmeterol Inhale 1 puff into the lungs 2 (two) times daily.   albuterol 108 (90 Base) MCG/ACT inhaler Commonly known as: VENTOLIN HFA Inhale 1-2 puffs into the lungs every 4 (four) hours as needed for wheezing or shortness of breath.   amitriptyline 25 MG tablet Commonly known as: ELAVIL Take 50 mg by mouth at bedtime.   Bayer Contour Test test strip Generic drug: glucose blood 1 each by Other route 2 (two) times a day. DX E11.29   Calcium 500+D3 500-400 MG-UNIT Tabs Generic drug: Calcium Carb-Cholecalciferol Take 1 tablet by mouth daily.   cephALEXin 500 MG capsule Commonly known as: KEFLEX Take 1 capsule (500 mg total) by mouth 2 (two) times daily for 10 days.   Cosentyx (300 MG Dose) 150 MG/ML Sosy Generic drug: Secukinumab (300 MG Dose) Inject 300 mg into the skin every 30 (thirty) days.   Dexilant 60 MG capsule Generic drug: dexlansoprazole Take 60 mg by mouth 2 (two) times a day.   ferrous sulfate 325 (65 FE) MG tablet Take 325 mg by mouth daily with breakfast.   fexofenadine 180 MG tablet Commonly known as: ALLEGRA Take 180 mg by mouth daily.   furosemide 20 MG tablet Commonly known as: LASIX Take 20 mg by mouth every morning.   lansoprazole 30 MG capsule Commonly known as: PREVACID Take 1 capsule (30 mg total) by mouth 2 (two) times daily before a meal. Wait at least 30 minutes before eating   losartan 50 MG  tablet Commonly known as: COZAAR Take 50 mg by mouth every morning.   MiraLax 17 GM/SCOOP powder Generic drug: polyethylene glycol powder Take 17 g by mouth daily.   montelukast 10 MG tablet Commonly known as: SINGULAIR Take 10 mg by mouth at bedtime.   MULTIVITAMIN PO Take 1 tablet by mouth daily.   mupirocin ointment 2 % Commonly known as: BACTROBAN Apply 1 application topically See admin instructions. APPLY TO TOE WOUNDS TWICE DAILY FOR INFECTION   oxyCODONE-acetaminophen 5-325 MG tablet Commonly known  as: PERCOCET/ROXICET Take 1 tablet by mouth See admin instructions. Take 1 tablet by mouth four to five times a day   potassium citrate 10 MEQ (1080 MG) SR tablet Commonly known as: UROCIT-K Take 10 mEq by mouth 3 (three) times daily with meals.   pregabalin 150 MG capsule Commonly known as: LYRICA Take 150 mg by mouth 2 (two) times daily.   Prevagen Extra Strength 20 MG Caps Generic drug: Apoaequorin Take 20 mg by mouth daily with breakfast.   Pristiq 50 MG 24 hr tablet Generic drug: desvenlafaxine Take 50 mg by mouth daily.   rosuvastatin 10 MG tablet Commonly known as: CRESTOR Take 5 mg by mouth every morning.   triamcinolone cream 0.5 % Commonly known as: KENALOG Apply 1 application topically See admin instructions. Apply to affected area 2-3 times a day as directed   verapamil 240 MG (CO) 24 hr tablet Commonly known as: COVERA HS Take 240 mg by mouth every morning.      Follow-up Information    [name], Fransico Him, MD. Call in 1 week(s).   Specialty: Internal Medicine Contact information: [place] [address] [phone]        Edrick Kins, DPM Follow up on 05/03/2019.   Specialty: Podiatry Contact information: [address] [place] [address] [phone]          Allergies  Allergen Reactions  . Penicillins Anaphylaxis    Has patient had a PCN reaction causing immediate rash, facial/tongue/throat swelling, SOB or lightheadedness with hypotension: yes Has patient had a PCN reaction causing severe rash involving mucus membranes or skin necrosis: unknown Has patient had a PCN reaction that required hospitalization unknown Has patient had a PCN reaction occurring within the last 10 years: no If all of the above answers are "NO", then may proceed with Cephalosporin use.   . Sulfonamide Derivatives Anaphylaxis  . Clindamycin/Lincomycin Nausea And Vomiting  . Ciprofloxacin Hives     Consultations:  none   Procedures/Studies: Mr Toes Left Wo Contrast  Result Date: 04/28/2019 CLINICAL DATA:  Infection of the left second toe. Persistent pain, swelling, and erythema. EXAM: MRI OF THE LEFT TOES WITHOUT CONTRAST TECHNIQUE: Multiplanar, multisequence MR imaging of the left toes was performed. No intravenous contrast was administered. COMPARISON:  Radiographs dated 04/27/2019 FINDINGS: Bones/Joint/Cartilage There is no evidence of osteomyelitis or other acute bone abnormality. Minimal arthritic changes at the first MTP joint. No joint effusions. Ligaments Normal. Muscles and Tendons Normal. Soft tissues There is edema in the subcutaneous soft tissues of the second toe consistent with cellulitis. No definable abscess. IMPRESSION: Findings consistent with cellulitis of the second toe. No evidence of abscess or osteomyelitis. Electronically Signed   By: Lorriane Shire M.D.   On: 04/28/2019 09:08   Dg Chest Port 1 View  Result Date: 04/27/2019 CLINICAL DATA:  Chest pain and myalgias. Fever. EXAM: PORTABLE CHEST 1 VIEW COMPARISON:  08/29/2015. FINDINGS: Borderline enlarged cardiac silhouette, accentuated by  the portable AP technique and a poor inspiration. Right-sided aortic arch. Clear lungs. Diffuse osteopenia. Stable left upper abdominal surgical clips and surgical clips in the inferior mediastinum. IMPRESSION: No acute abnormality. Electronically Signed   By: Claudie Revering M.D.   On: 04/27/2019 17:14   Dg Foot Complete Left  Result Date: 04/27/2019 CLINICAL DATA:  Left great and 2nd toe ulcers. Clinical concern for osteomyelitis. Diabetes. EXAM: LEFT FOOT - COMPLETE 3+ VIEW COMPARISON:  04/11/2019 FINDINGS: Stable mild diffuse distal soft tissue swelling. No bone destruction, periosteal reaction or soft tissue gas. Small calcaneal spurs. IMPRESSION: Stable mild distal soft tissue swelling without evidence of underlying osteomyelitis. Electronically Signed   By: Claudie Revering M.D.   On:  04/27/2019 17:17   Dg Foot Complete Left  Result Date: 04/11/2019 CLINICAL DATA:  Redness and swelling.  Diabetes.  No injury. EXAM: LEFT FOOT - COMPLETE 3+ VIEW COMPARISON:  No recent prior. FINDINGS: No acute bony or joint abnormality. No evidence of fracture or dislocation. No focal bony erosions. Diffuse soft tissue swelling noted. No radiopaque foreign body. IMPRESSION: Diffuse soft tissue swelling. No radiopaque foreign body. No acute or focal bony abnormality. No bony erosions noted. Electronically Signed   By: Marcello Moores  Register   On: 04/11/2019 06:36   Dg Toe 5th Right  Result Date: 04/10/2019 CLINICAL DATA:  Initial evaluation for acute trauma, pain. EXAM: RIGHT FIFTH TOE COMPARISON:  None. FINDINGS: No acute fracture dislocation. Joint spaces maintained. Mild diffuse osteopenia. No visible soft tissue injury. IMPRESSION: No acute osseous abnormality about the right fifth toe. Electronically Signed   By: Jeannine Boga M.D.   On: 04/10/2019 22:13       Subjective: Patient seen and examined at bedside, no complaints.  Ready for discharge home.  Right second toe erythema much improved.  We will continue antibiotics outpatient.  Follow-up PCP and ENT in 1 week.  Denies headache, no fever/chills/night sweats, no nausea/vomiting/diarrhea, no chest pain, no palpitations, no shortness of breath, no abdominal pain.  No acute events overnight per nursing staff.   Discharge Exam: Vitals:   05/01/19 0506 05/01/19 0807  BP: 125/79   Pulse: 69   Resp:    Temp: 98.3 F (36.8 C)   SpO2: 99% 99%   Vitals:   04/30/19 1936 04/30/19 2004 05/01/19 0506 05/01/19 0807  BP:  114/61 125/79   Pulse:  68 69   Resp:      Temp:  97.7 F (36.5 C) 98.3 F (36.8 C)   TempSrc:  Oral Oral   SpO2: 98% 100% 99% 99%  Weight:      Height:        General: Pt is alert, awake, not in acute distress Cardiovascular: RRR, S1/S2 +, no rubs, no gallops Respiratory: CTA bilaterally, no wheezing, no  rhonchi Abdominal: Soft, NT, ND, bowel sounds + Extremities:Erythema and edema noted to second toe left foot, improved; no purulent discharge    The results of significant diagnostics from this hospitalization (including imaging, microbiology, ancillary and laboratory) are listed below for reference.     Microbiology: Recent Results (from the past 240 hour(s))  Blood Culture (routine x 2)     Status: None (Preliminary result)   Collection Time: 04/27/19  4:10 PM   Specimen: BLOOD  Result Value Ref Range Status   Specimen Description BLOOD LEFT ANTECUBITAL  Final   Special Requests   Final    BOTTLES DRAWN AEROBIC AND ANAEROBIC Blood Culture results may not be optimal due  to an excessive volume of blood received in culture bottles   Culture   Final    NO GROWTH 3 DAYS Performed at [facility], [place] [address], [place], [place] [address]    Report Status PENDING  Incomplete  SARS Coronavirus 2 (CEPHEID- Performed in [place] hospital lab), Hosp Order     Status: None   Collection Time: 04/27/19  4:24 PM   Specimen: Nasopharyngeal Swab  Result Value Ref Range Status   SARS Coronavirus 2 NEGATIVE NEGATIVE Final    Comment: (NOTE) If result is NEGATIVE SARS-CoV-2 target nucleic acids are NOT DETECTED. The SARS-CoV-2 RNA is generally detectable in upper and lower  respiratory specimens during the acute phase of infection. The lowest  concentration of SARS-CoV-2 viral copies this assay can detect is 250  copies / mL. A negative result does not preclude SARS-CoV-2 infection  and should not be used as the sole basis for treatment or other  patient management decisions.  A negative result may occur with  improper specimen collection / handling, submission of specimen other  than nasopharyngeal swab, presence of viral mutation(s) within the  areas targeted by this assay, and inadequate number of viral copies  (<250 copies / mL). A negative result must be combined with clinical   observations, patient history, and epidemiological information. If result is POSITIVE SARS-CoV-2 target nucleic acids are DETECTED. The SARS-CoV-2 RNA is generally detectable in upper and lower  respiratory specimens dur ing the acute phase of infection.  Positive  results are indicative of active infection with SARS-CoV-2.  Clinical  correlation with patient history and other diagnostic information is  necessary to determine patient infection status.  Positive results do  not rule out bacterial infection or co-infection with other viruses. If result is PRESUMPTIVE POSTIVE SARS-CoV-2 nucleic acids MAY BE PRESENT.   A presumptive positive result was obtained on the submitted specimen  and confirmed on repeat testing.  While 2019 novel coronavirus  (SARS-CoV-2) nucleic acids may be present in the submitted sample  additional confirmatory testing may be necessary for epidemiological  and / or clinical management purposes  to differentiate between  SARS-CoV-2 and other Sarbecovirus currently known to infect humans.  If clinically indicated additional testing with an alternate test  methodology [phone]) is advised. The SARS-CoV-2 RNA is generally  detectable in upper and lower respiratory sp ecimens during the acute  phase of infection. The expected result is Negative. Fact Sheet for Patients:  StrictlyIdeas.no Fact Sheet for Healthcare Providers: BankingDealers.co.za This test is not yet approved or cleared by the [place] FDA and has been authorized for detection and/or diagnosis of SARS-CoV-2 by FDA under an Emergency Use Authorization (EUA).  This EUA will remain in effect (meaning this test can be used) for the duration of the COVID-19 declaration under Section 564(b)(1) of the Act, 21 U.S.C. section 360bbb-3(b)(1), unless the authorization is terminated or revoked sooner. Performed at [facility], [place] [address],  [place], [place] [address]   Blood Culture (routine x 2)     Status: None (Preliminary result)   Collection Time: 04/27/19  4:25 PM   Specimen: BLOOD RIGHT FOREARM  Result Value Ref Range Status   Specimen Description BLOOD RIGHT FOREARM  Final   Special Requests   Final    BOTTLES DRAWN AEROBIC AND ANAEROBIC Blood Culture adequate volume   Culture  Setup Time ANAEROBIC BOTTLE ONLY NO ORGANISMS SEEN   Final   Culture   Final  NO GROWTH 3 DAYS Performed at [facility], [place] [address] [place], [place] [address]    Report Status PENDING  Incomplete  MRSA PCR Screening     Status: None   Collection Time: 04/27/19  9:07 PM   Specimen: Nasopharyngeal  Result Value Ref Range Status   MRSA by PCR NEGATIVE NEGATIVE Final    Comment:        The GeneXpert MRSA Assay (FDA approved for NASAL specimens only), is one component of a comprehensive MRSA colonization surveillance program. It is not intended to diagnose MRSA infection nor to guide or monitor treatment for MRSA infections. Performed at [facility], [place] [address] [place], [place] [address]   Urine culture     Status: None   Collection Time: 04/27/19  9:58 PM   Specimen: In/Out Cath Urine  Result Value Ref Range Status   Specimen Description IN/OUT CATH URINE  Final   Special Requests NONE  Final   Culture   Final    NO GROWTH Performed at [facility], [place] [address], [place], [place] [address]    Report Status 04/28/2019 FINAL  Final     Labs: BNP (last 3 results) No results for input(s): BNP in the last 8760 hours. Basic Metabolic Panel: Recent Labs  Lab 04/27/19 1622 04/27/19 2155 04/28/19 0243 04/29/19 0236  NA 138  --  141 139  K 3.6  --  3.4* 3.7  CL 104  --  115* 109  CO2 23  --  20* 22  GLUCOSE 135*  --  73 83  BUN 22*  --  13 10  CREATININE 0.94 0.76 0.60 0.72  CALCIUM 8.9  --  7.3* 8.7*  MG  --   --   --  1.9   Liver Function Tests: Recent Labs  Lab 04/27/19 1622  AST 26   ALT 22  ALKPHOS 87  BILITOT 0.6  PROT 7.0  ALBUMIN 3.6   No results for input(s): LIPASE, AMYLASE in the last 168 hours. No results for input(s): AMMONIA in the last 168 hours. CBC: Recent Labs  Lab 04/27/19 1622 04/27/19 2155 04/28/19 0243 04/29/19 0236  WBC 7.6 6.8 4.7 2.7*  NEUTROABS 6.5  --   --   --   HGB 11.1* 10.5* 9.6* 10.5*  HCT 34.8* 32.6* 30.3* 33.0*  MCV 85.7 85.6 87.3 87.3  PLT 181 178 140* 154   Cardiac Enzymes: No results for input(s): CKTOTAL, CKMB, CKMBINDEX, TROPONINI in the last 168 hours. BNP: Invalid input(s): POCBNP CBG: Recent Labs  Lab 04/30/19 0803 04/30/19 1201 04/30/19 1549 04/30/19 2118 05/01/19 0757  GLUCAP 66* 70 89 78 77   D-Dimer No results for input(s): DDIMER in the last 72 hours. Hgb A1c No results for input(s): HGBA1C in the last 72 hours. Lipid Profile No results for input(s): CHOL, HDL, LDLCALC, TRIG, CHOLHDL, LDLDIRECT in the last 72 hours. Thyroid function studies No results for input(s): TSH, T4TOTAL, T3FREE, THYROIDAB in the last 72 hours.  Invalid input(s): FREET3 Anemia work up No results for input(s): VITAMINB12, FOLATE, FERRITIN, TIBC, IRON, RETICCTPCT in the last 72 hours. Urinalysis    Component Value Date/Time   COLORURINE STRAW (A) 04/27/2019 2158   APPEARANCEUR CLEAR 04/27/2019 2158   LABSPEC 1.008 04/27/2019 2158   PHURINE 7.0 04/27/2019 2158   GLUCOSEU NEGATIVE 04/27/2019 2158   HGBUR NEGATIVE 04/27/2019 2158   BILIRUBINUR NEGATIVE 04/27/2019 2158   KETONESUR NEGATIVE 04/27/2019 2158   PROTEINUR NEGATIVE  04/27/2019 2158   NITRITE NEGATIVE 04/27/2019 2158   LEUKOCYTESUR NEGATIVE 04/27/2019 2158   Sepsis Labs Invalid input(s): PROCALCITONIN,  WBC,  LACTICIDVEN Microbiology Recent Results (from the past 240 hour(s))  Blood Culture (routine x 2)     Status: None (Preliminary result)   Collection Time: 04/27/19  4:10 PM   Specimen: BLOOD  Result Value Ref Range Status   Specimen Description BLOOD  LEFT ANTECUBITAL  Final   Special Requests   Final    BOTTLES DRAWN AEROBIC AND ANAEROBIC Blood Culture results may not be optimal due to an excessive volume of blood received in culture bottles   Culture   Final    NO GROWTH 3 DAYS Performed at [facility], [place] [address], [place], [place] [address]    Report Status PENDING  Incomplete  SARS Coronavirus 2 (CEPHEID- Performed in [place] hospital lab), Hosp Order     Status: None   Collection Time: 04/27/19  4:24 PM   Specimen: Nasopharyngeal Swab  Result Value Ref Range Status   SARS Coronavirus 2 NEGATIVE NEGATIVE Final    Comment: (NOTE) If result is NEGATIVE SARS-CoV-2 target nucleic acids are NOT DETECTED. The SARS-CoV-2 RNA is generally detectable in upper and lower  respiratory specimens during the acute phase of infection. The lowest  concentration of SARS-CoV-2 viral copies this assay can detect is 250  copies / mL. A negative result does not preclude SARS-CoV-2 infection  and should not be used as the sole basis for treatment or other  patient management decisions.  A negative result may occur with  improper specimen collection / handling, submission of specimen other  than nasopharyngeal swab, presence of viral mutation(s) within the  areas targeted by this assay, and inadequate number of viral copies  (<250 copies / mL). A negative result must be combined with clinical  observations, patient history, and epidemiological information. If result is POSITIVE SARS-CoV-2 target nucleic acids are DETECTED. The SARS-CoV-2 RNA is generally detectable in upper and lower  respiratory specimens dur ing the acute phase of infection.  Positive  results are indicative of active infection with SARS-CoV-2.  Clinical  correlation with patient history and other diagnostic information is  necessary to determine patient infection status.  Positive results do  not rule out bacterial infection or co-infection with other viruses. If  result is PRESUMPTIVE POSTIVE SARS-CoV-2 nucleic acids MAY BE PRESENT.   A presumptive positive result was obtained on the submitted specimen  and confirmed on repeat testing.  While 2019 novel coronavirus  (SARS-CoV-2) nucleic acids may be present in the submitted sample  additional confirmatory testing may be necessary for epidemiological  and / or clinical management purposes  to differentiate between  SARS-CoV-2 and other Sarbecovirus currently known to infect humans.  If clinically indicated additional testing with an alternate test  methodology [phone]) is advised. The SARS-CoV-2 RNA is generally  detectable in upper and lower respiratory sp ecimens during the acute  phase of infection. The expected result is Negative. Fact Sheet for Patients:  StrictlyIdeas.no Fact Sheet for Healthcare Providers: BankingDealers.co.za This test is not yet approved or cleared by the [place] FDA and has been authorized for detection and/or diagnosis of SARS-CoV-2 by FDA under an Emergency Use Authorization (EUA).  This EUA will remain in effect (meaning this test can be used) for the duration of the COVID-19 declaration under Section 564(b)(1) of the Act, 21 U.S.C. section 360bbb-3(b)(1), unless the authorization is terminated or revoked sooner. Performed  at [facility], [place] [address], [place], [place] [address]   Blood Culture (routine x 2)     Status: None (Preliminary result)   Collection Time: 04/27/19  4:25 PM   Specimen: BLOOD RIGHT FOREARM  Result Value Ref Range Status   Specimen Description BLOOD RIGHT FOREARM  Final   Special Requests   Final    BOTTLES DRAWN AEROBIC AND ANAEROBIC Blood Culture adequate volume   Culture  Setup Time ANAEROBIC BOTTLE ONLY NO ORGANISMS SEEN   Final   Culture   Final    NO GROWTH 3 DAYS Performed at [facility], [place] [address], [place], [place] [address]    Report Status PENDING   Incomplete  MRSA PCR Screening     Status: None   Collection Time: 04/27/19  9:07 PM   Specimen: Nasopharyngeal  Result Value Ref Range Status   MRSA by PCR NEGATIVE NEGATIVE Final    Comment:        The GeneXpert MRSA Assay (FDA approved for NASAL specimens only), is one component of a comprehensive MRSA colonization surveillance program. It is not intended to diagnose MRSA infection nor to guide or monitor treatment for MRSA infections. Performed at [facility], [place] [address], [place], [place] [address]   Urine culture     Status: None   Collection Time: 04/27/19  9:58 PM   Specimen: In/Out Cath Urine  Result Value Ref Range Status   Specimen Description IN/OUT CATH URINE  Final   Special Requests NONE  Final   Culture   Final    NO GROWTH Performed at [facility], [place] [address] [place],  [address]    Report Status 04/28/2019 FINAL  Final     Time coordinating discharge: Over 30 minutes  SIGNED:   Eric J [place] DO  [facility] 05/01/2019, 9:06 AM

## 2019-05-01 NOTE — Discharge Instructions (Signed)

## 2019-05-01 NOTE — Progress Notes (Signed)
Provided discharge education/instructions, all questions and concerns addressed, Pt not in distress, to discharge home with belongings. 

## 2019-05-02 LAB — CULTURE, BLOOD (ROUTINE X 2)
Culture: NO GROWTH
Special Requests: ADEQUATE

## 2019-05-04 ENCOUNTER — Ambulatory Visit: Payer: 59 | Admitting: Podiatry

## 2019-05-08 ENCOUNTER — Encounter: Payer: Self-pay | Admitting: Podiatry

## 2019-05-08 ENCOUNTER — Ambulatory Visit (INDEPENDENT_AMBULATORY_CARE_PROVIDER_SITE_OTHER): Payer: 59 | Admitting: Podiatry

## 2019-05-08 ENCOUNTER — Other Ambulatory Visit: Payer: Self-pay

## 2019-05-08 VITALS — Temp 97.3°F

## 2019-05-08 DIAGNOSIS — L97522 Non-pressure chronic ulcer of other part of left foot with fat layer exposed: Secondary | ICD-10-CM

## 2019-05-08 DIAGNOSIS — L03032 Cellulitis of left toe: Secondary | ICD-10-CM

## 2019-05-08 DIAGNOSIS — E0843 Diabetes mellitus due to underlying condition with diabetic autonomic (poly)neuropathy: Secondary | ICD-10-CM | POA: Diagnosis not present

## 2019-05-08 DIAGNOSIS — L989 Disorder of the skin and subcutaneous tissue, unspecified: Secondary | ICD-10-CM | POA: Diagnosis not present

## 2019-05-10 NOTE — Progress Notes (Signed)
HPI: 53 year old female presents the office today for follow up evaluation of an ulceration of the left second toe, pre-ulcerative callus lesions of the bilateral feet and recurrent cellulitis. She states she is doing well overall. She states she feels her wound is improving. She has been using Mupirocin ointment daily as well as taking Keflex as directed at hospital discharge. There are no worsening factors noted. Patient is here for further evaluation and treatment.   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 where it was done,  about 6 yrs. 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  Dermatology: Skin is warm, dry and supple bilateral lower extremities. Negative for open lesions or  macerations.  Hyperkeratotic pre-ulcerative callus lesions noted to the bilateral feet x3.  No open wounds noted, with exception of an ulceration noted to the plantar aspect of the left great toe measuring approximately 0.3 x 0.1 x 0.2 cm.  To the noted ulceration there is no eschar.  There is a moderate amount of slough and necrotic tissue noted.  No malodor noted.  Minimal serous drainage noted.  Periwound integrity is callused.  Vascular: Palpable pedal pulses bilaterally. No edema or erythema noted. Capillary refill within normal limits.  There is some mild erythema to the left second digit that appears to be significantly improved based on a prior picture that the patient showed to me during her visit today.  Neurological: Epicritic and protective threshold diminished bilaterally.   Musculoskeletal Exam: Range of motion within normal limits to all pedal and ankle joints bilateral. Muscle strength 5/5 in all groups bilateral.   Assessment: 1. Ulcer left great toe secondary to diabetes mellitus 2. Pre-ulcerative callus lesions bilateral x3 3. Cellulitis left second toe - recurrent    Plan of Care:  1. Patient evaluated. 2. Medically necessary excisional debridement including subcutaneous tissue was performed using a tissue nipper and a chisel blade. Excisional debridement of all the necrotic nonviable tissue down to healthy bleeding viable tissue  was performed with post-debridement measurements same as pre-. 3. The wound was cleansed and dry sterile dressing applied. 4. Continue using Mupirocin ointment daily.  5. Continue taking oral Keflex 500 mg BID x 10 days as prescribed at hospital discharge.  6. Return to clinic in 2 weeks.      Felecia ShellingBrent M. Dreonna Hussein, DPM [facility]  Dr. Felecia ShellingBrent M. Kileigh Ortmann, DPM    [address]                                        [place], [place] [id number]                Office [phone]  Fax [phone]

## 2019-05-22 ENCOUNTER — Ambulatory Visit (INDEPENDENT_AMBULATORY_CARE_PROVIDER_SITE_OTHER): Payer: 59 | Admitting: Podiatry

## 2019-05-22 ENCOUNTER — Encounter: Payer: Self-pay | Admitting: Podiatry

## 2019-05-22 ENCOUNTER — Other Ambulatory Visit: Payer: Self-pay

## 2019-05-22 VITALS — Temp 97.3°F

## 2019-05-22 DIAGNOSIS — S8011XA Contusion of right lower leg, initial encounter: Secondary | ICD-10-CM | POA: Diagnosis not present

## 2019-05-22 DIAGNOSIS — L989 Disorder of the skin and subcutaneous tissue, unspecified: Secondary | ICD-10-CM | POA: Diagnosis not present

## 2019-05-22 DIAGNOSIS — E0843 Diabetes mellitus due to underlying condition with diabetic autonomic (poly)neuropathy: Secondary | ICD-10-CM

## 2019-05-22 DIAGNOSIS — L03032 Cellulitis of left toe: Secondary | ICD-10-CM | POA: Diagnosis not present

## 2019-05-22 DIAGNOSIS — S8012XA Contusion of left lower leg, initial encounter: Secondary | ICD-10-CM

## 2019-05-24 ENCOUNTER — Telehealth: Payer: Self-pay | Admitting: Diagnostic Neuroimaging

## 2019-05-24 NOTE — Telephone Encounter (Signed)
I spoke with son, Dominique Evans and informed him that Dr Leta Baptist had Vit B12 done which was normal. He ordered a PET scan which her insurance denied even after a peer-to- peer review. He also sent a referral to [facility], Dr Ala Bent for a neuropsych testing. I informed Dominique Evans I spoke with her on 03/20/19 and gave her Dr Aquino's information to call and check on referral. He stated she probably threw it away. I gave him Dr Aquino's name, number. He stated he would call them,  verbalized understanding, appreciation.

## 2019-05-24 NOTE — Telephone Encounter (Signed)
Pt has called with her son(that is on DPR) son states they were told there would be more testing done but they have not heard from anyone, please call

## 2019-05-25 NOTE — Progress Notes (Signed)
HPI: 53 year old female presents the office today for follow up evaluation of an ulceration of the left second toe, pre-ulcerative callus lesions of the bilateral feet and recurrent cellulitis. She states she is doing well overall. She does not believe there has been much difference in her symptoms since her previous visit. She has taken the Keflex as directed and has been using the Mupirocin ointment for treatment. Patient is here for further evaluation and treatment.   Past Medical History:  Diagnosis Date  . Allergic rhinitis   . Anemia   . Arthritis    spondylolisthesis- lumbar region  . Asthma    PFT 08/12/07: FEV1 2.97 (104%), FEV1% 84, TLC 4.89 (90%), DLCO 84%, +BD  . Blood transfusion    hx of 2002   . Blood transfusion without reported diagnosis    kidney, no issues  . Chronic constipation   . 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 Cough 03/23/13  . Depression   . Diabetes mellitus    pre gastric bypass  . Esophageal dysmotility 03/01/2015  . GERD (gastroesophageal reflux disease)   . H/O hiatal hernia   . Head injury due to trauma    age 78, went under car while sledding, had memory loss at that time  . 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 History of stress test    exercise stress test, ? where it was done,  about 6 yrs. ago,  results- negative   . Hydatidiform mole   . Hyperlipidemia   . Hypertension   . 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 Obesity   . Panic attacks   . Peripheral neuropathy    bilateral feet  . PONV (postoperative nausea and vomiting)   . Psoriasis   . Seasonal allergies   . Status post dilation of esophageal narrowing   . Unspecified essential hypertension   . Visual disturbance      Physical Exam: General: The patient is alert and oriented x3 in no acute distress.  Dermatology: Wound noted to the left great toe has healed. Complete re-epithelialization has occurred.  No drainage noted.  Hyperkeratotic pre-ulcerative callus lesions noted to the bilateral feet x 3.   Contusions noted to the bilateral lower extremities.  Skin is warm, dry and supple bilateral lower extremities. Negative for open lesions or macerations.   Vascular: Palpable pedal pulses bilaterally. No edema noted. Capillary refill within normal limits.  There is some mild erythema to the left second digit that appears to be significantly improved based on a prior picture that the patient showed to me during her visit today.  Neurological: Epicritic and protective threshold diminished bilaterally.   Musculoskeletal Exam: Range of motion within normal limits to all pedal and ankle joints bilateral. Muscle strength 5/5 in all groups bilateral.   Assessment: 1. Ulcer left great toe secondary to diabetes mellitus - healed  2. Pre-ulcerative callus lesions bilateral x3 3. Cellulitis left second toe - improved  4. Contusions noted to bilateral legs   Plan of Care:  1. Patient evaluated. 2. Excisional debridement of keratotic lesions using a chisel blade was performed without incident. Light dressing applied.  3. Continue using Mupirocin ointment daily.  4. Return to clinic in 4 weeks.      Edrick Kins, DPM [facility]  Dr. Edrick Kins, DPM    [address]  [place], [place] [address]                Office [phone]  Fax [phone]

## 2019-06-01 NOTE — Telephone Encounter (Signed)
Pt son has called back to inform that he just called Dr Raliegh Ip Aquino's office and they never received the referal.  Son was asked to call and ask that another referal be sent over to Dr Raliegh Ip Aquino's office

## 2019-06-06 NOTE — Telephone Encounter (Signed)
I have called [facility] and Left a message Dr Raliegh Ip Aquino'sDr K Aquino's  [phone]

## 2019-06-07 NOTE — Telephone Encounter (Signed)
I have called and spoke to patient's son and relayed to him referral was sent to Dr. Amparo Bristol office they were waiting on opening son will be contacted with next to weeks spoke with Hinton Dyer at [facility].

## 2019-06-09 ENCOUNTER — Emergency Department (HOSPITAL_COMMUNITY): Payer: No Typology Code available for payment source

## 2019-06-09 ENCOUNTER — Inpatient Hospital Stay (HOSPITAL_COMMUNITY)
Admission: EM | Admit: 2019-06-09 | Discharge: 2019-06-11 | DRG: 392 | Disposition: A | Discharge status: Home / Self Care | Payer: No Typology Code available for payment source | Attending: Family Medicine | Admitting: Family Medicine

## 2019-06-09 DIAGNOSIS — R413 Other amnesia: Secondary | ICD-10-CM | POA: Diagnosis present

## 2019-06-09 DIAGNOSIS — Z79899 Other long term (current) drug therapy: Secondary | ICD-10-CM

## 2019-06-09 DIAGNOSIS — R0789 Other chest pain: Secondary | ICD-10-CM | POA: Diagnosis not present

## 2019-06-09 DIAGNOSIS — Z833 Family history of diabetes mellitus: Secondary | ICD-10-CM

## 2019-06-09 DIAGNOSIS — F329 Major depressive disorder, single episode, unspecified: Secondary | ICD-10-CM | POA: Diagnosis present

## 2019-06-09 DIAGNOSIS — K449 Diaphragmatic hernia without obstruction or gangrene: Secondary | ICD-10-CM | POA: Diagnosis present

## 2019-06-09 DIAGNOSIS — K5909 Other constipation: Secondary | ICD-10-CM | POA: Diagnosis present

## 2019-06-09 DIAGNOSIS — R0600 Dyspnea, unspecified: Secondary | ICD-10-CM

## 2019-06-09 DIAGNOSIS — I152 Hypertension secondary to endocrine disorders: Secondary | ICD-10-CM | POA: Diagnosis present

## 2019-06-09 DIAGNOSIS — E1159 Type 2 diabetes mellitus with other circulatory complications: Secondary | ICD-10-CM | POA: Diagnosis present

## 2019-06-09 DIAGNOSIS — Z803 Family history of malignant neoplasm of breast: Secondary | ICD-10-CM

## 2019-06-09 DIAGNOSIS — G8929 Other chronic pain: Secondary | ICD-10-CM | POA: Diagnosis present

## 2019-06-09 DIAGNOSIS — Z8349 Family history of other endocrine, nutritional and metabolic diseases: Secondary | ICD-10-CM

## 2019-06-09 DIAGNOSIS — K222 Esophageal obstruction: Secondary | ICD-10-CM | POA: Diagnosis present

## 2019-06-09 DIAGNOSIS — K228 Other specified diseases of esophagus: Secondary | ICD-10-CM | POA: Diagnosis present

## 2019-06-09 DIAGNOSIS — L97509 Non-pressure chronic ulcer of other part of unspecified foot with unspecified severity: Secondary | ICD-10-CM

## 2019-06-09 DIAGNOSIS — E669 Obesity, unspecified: Secondary | ICD-10-CM | POA: Diagnosis present

## 2019-06-09 DIAGNOSIS — Z981 Arthrodesis status: Secondary | ICD-10-CM

## 2019-06-09 DIAGNOSIS — Z8249 Family history of ischemic heart disease and other diseases of the circulatory system: Secondary | ICD-10-CM

## 2019-06-09 DIAGNOSIS — Z87891 Personal history of nicotine dependence: Secondary | ICD-10-CM

## 2019-06-09 DIAGNOSIS — Z880 Allergy status to penicillin: Secondary | ICD-10-CM

## 2019-06-09 DIAGNOSIS — R112 Nausea with vomiting, unspecified: Secondary | ICD-10-CM | POA: Diagnosis present

## 2019-06-09 DIAGNOSIS — E1169 Type 2 diabetes mellitus with other specified complication: Secondary | ICD-10-CM | POA: Diagnosis not present

## 2019-06-09 DIAGNOSIS — E11621 Type 2 diabetes mellitus with foot ulcer: Secondary | ICD-10-CM | POA: Diagnosis present

## 2019-06-09 DIAGNOSIS — J45909 Unspecified asthma, uncomplicated: Secondary | ICD-10-CM | POA: Diagnosis present

## 2019-06-09 DIAGNOSIS — E785 Hyperlipidemia, unspecified: Secondary | ICD-10-CM | POA: Diagnosis present

## 2019-06-09 DIAGNOSIS — R1314 Dysphagia, pharyngoesophageal phase: Principal | ICD-10-CM | POA: Diagnosis present

## 2019-06-09 DIAGNOSIS — K224 Dyskinesia of esophagus: Secondary | ICD-10-CM | POA: Diagnosis present

## 2019-06-09 DIAGNOSIS — R4189 Other symptoms and signs involving cognitive functions and awareness: Secondary | ICD-10-CM | POA: Diagnosis present

## 2019-06-09 DIAGNOSIS — Z801 Family history of malignant neoplasm of trachea, bronchus and lung: Secondary | ICD-10-CM

## 2019-06-09 DIAGNOSIS — Z6830 Body mass index (BMI) 30.0-30.9, adult: Secondary | ICD-10-CM

## 2019-06-09 DIAGNOSIS — Z882 Allergy status to sulfonamides status: Secondary | ICD-10-CM

## 2019-06-09 DIAGNOSIS — Z20828 Contact with and (suspected) exposure to other viral communicable diseases: Secondary | ICD-10-CM | POA: Diagnosis present

## 2019-06-09 DIAGNOSIS — Z79891 Long term (current) use of opiate analgesic: Secondary | ICD-10-CM

## 2019-06-09 DIAGNOSIS — Z905 Acquired absence of kidney: Secondary | ICD-10-CM

## 2019-06-09 DIAGNOSIS — Z9884 Bariatric surgery status: Secondary | ICD-10-CM

## 2019-06-09 DIAGNOSIS — R1310 Dysphagia, unspecified: Secondary | ICD-10-CM

## 2019-06-09 DIAGNOSIS — E1140 Type 2 diabetes mellitus with diabetic neuropathy, unspecified: Secondary | ICD-10-CM | POA: Diagnosis not present

## 2019-06-09 DIAGNOSIS — K219 Gastro-esophageal reflux disease without esophagitis: Secondary | ICD-10-CM | POA: Diagnosis present

## 2019-06-09 DIAGNOSIS — Z881 Allergy status to other antibiotic agents status: Secondary | ICD-10-CM

## 2019-06-09 DIAGNOSIS — Z87442 Personal history of urinary calculi: Secondary | ICD-10-CM

## 2019-06-09 DIAGNOSIS — K2210 Ulcer of esophagus without bleeding: Secondary | ICD-10-CM | POA: Diagnosis present

## 2019-06-09 DIAGNOSIS — Z7951 Long term (current) use of inhaled steroids: Secondary | ICD-10-CM

## 2019-06-09 DIAGNOSIS — K210 Gastro-esophageal reflux disease with esophagitis: Secondary | ICD-10-CM | POA: Diagnosis present

## 2019-06-09 DIAGNOSIS — Z808 Family history of malignant neoplasm of other organs or systems: Secondary | ICD-10-CM

## 2019-06-09 DIAGNOSIS — G4733 Obstructive sleep apnea (adult) (pediatric): Secondary | ICD-10-CM | POA: Diagnosis present

## 2019-06-09 DIAGNOSIS — R1319 Other dysphagia: Secondary | ICD-10-CM

## 2019-06-09 LAB — CBC WITH DIFFERENTIAL/PLATELET
Abs Immature Granulocytes: 0.01 10*3/uL (ref 0.00–0.07)
Basophils Absolute: 0 10*3/uL (ref 0.0–0.1)
Basophils Relative: 1 %
Eosinophils Absolute: 0.1 10*3/uL (ref 0.0–0.5)
Eosinophils Relative: 1 %
HCT: 42.8 % (ref 36.0–46.0)
Hemoglobin: 13.6 g/dL (ref 12.0–15.0)
Immature Granulocytes: 0 %
Lymphocytes Relative: 27 %
Lymphs Abs: 2.1 10*3/uL (ref 0.7–4.0)
MCH: 27.9 pg (ref 26.0–34.0)
MCHC: 31.8 g/dL (ref 30.0–36.0)
MCV: 87.7 fL (ref 80.0–100.0)
Monocytes Absolute: 0.5 10*3/uL (ref 0.1–1.0)
Monocytes Relative: 7 %
Neutro Abs: 5 10*3/uL (ref 1.7–7.7)
Neutrophils Relative %: 64 %
Platelets: 288 10*3/uL (ref 150–400)
RBC: 4.88 MIL/uL (ref 3.87–5.11)
RDW: 13.8 % (ref 11.5–15.5)
WBC: 7.8 10*3/uL (ref 4.0–10.5)
nRBC: 0 % (ref 0.0–0.2)

## 2019-06-09 LAB — COMPREHENSIVE METABOLIC PANEL
ALT: 20 U/L (ref 0–44)
AST: 24 U/L (ref 15–41)
Albumin: 3.9 g/dL (ref 3.5–5.0)
Alkaline Phosphatase: 93 U/L (ref 38–126)
Anion gap: 11 (ref 5–15)
BUN: 14 mg/dL (ref 6–20)
CO2: 23 mmol/L (ref 22–32)
Calcium: 9.3 mg/dL (ref 8.9–10.3)
Chloride: 107 mmol/L (ref 98–111)
Creatinine, Ser: 0.94 mg/dL (ref 0.44–1.00)
GFR calc Af Amer: >60 mL/min (ref >60)
GFR calc non Af Amer: >60 mL/min (ref >60)
Glucose, Bld: 94 mg/dL (ref 70–99)
Potassium: 4.4 mmol/L (ref 3.5–5.1)
Sodium: 141 mmol/L (ref 135–145)
Total Bilirubin: 0.8 mg/dL (ref 0.3–1.2)
Total Protein: 7.4 g/dL (ref 6.5–8.1)

## 2019-06-09 LAB — URINALYSIS, ROUTINE W REFLEX MICROSCOPIC
Bilirubin Urine: NEGATIVE
Glucose, UA: NEGATIVE mg/dL
Hgb urine dipstick: NEGATIVE
Ketones, ur: 20 mg/dL — AB
Leukocytes,Ua: NEGATIVE
Nitrite: NEGATIVE
Protein, ur: NEGATIVE mg/dL
Specific Gravity, Urine: 1.026 (ref 1.005–1.030)
pH: 7 (ref 5.0–8.0)

## 2019-06-09 LAB — LIPASE, BLOOD: Lipase: 24 U/L (ref 11–51)

## 2019-06-09 LAB — D-DIMER, QUANTITATIVE: D-Dimer, Quant: 0.56 ug/mL-FEU — ABNORMAL HIGH (ref 0.00–0.50)

## 2019-06-09 LAB — SARS CORONAVIRUS 2 BY RT PCR (HOSPITAL ORDER, PERFORMED IN ~~LOC~~ HOSPITAL LAB): SARS Coronavirus 2: NEGATIVE

## 2019-06-09 LAB — TROPONIN I (HIGH SENSITIVITY): Troponin I (High Sensitivity): 5 ng/L (ref <18)

## 2019-06-09 MED ORDER — SODIUM CHLORIDE 0.9 % IV BOLUS
1000.0000 mL | Freq: Once | INTRAVENOUS | Status: AC
  Administered 2019-06-09: 1000 mL via INTRAVENOUS

## 2019-06-09 MED ORDER — MORPHINE SULFATE (PF) 2 MG/ML IV SOLN
2.0000 mg | INTRAVENOUS | Status: DC | PRN
  Administered 2019-06-09 – 2019-06-11 (×8): 2 mg via INTRAVENOUS
  Filled 2019-06-09 (×10): qty 1

## 2019-06-09 MED ORDER — IOHEXOL 300 MG/ML  SOLN: 50.0000 mL | Freq: Once | INTRAMUSCULAR | Status: DC | PRN

## 2019-06-09 MED ORDER — LOSARTAN POTASSIUM 50 MG PO TABS
50.0000 mg | ORAL_TABLET | Freq: Every morning | ORAL | Status: DC
  Filled 2019-06-09 (×2): qty 1

## 2019-06-09 MED ORDER — ALBUTEROL SULFATE HFA 108 (90 BASE) MCG/ACT IN AERS
1.0000 | INHALATION_SPRAY | Freq: Once | RESPIRATORY_TRACT | Status: AC
  Administered 2019-06-09: 2 via RESPIRATORY_TRACT
  Filled 2019-06-09: qty 6.7

## 2019-06-09 MED ORDER — LORAZEPAM 2 MG/ML IJ SOLN: 1.0000 mg | Freq: Once | INTRAMUSCULAR | Status: DC

## 2019-06-09 MED ORDER — PANTOPRAZOLE SODIUM 20 MG PO TBEC: 20.0000 mg | DELAYED_RELEASE_TABLET | Freq: Every day | ORAL | 0 refills | Status: DC

## 2019-06-09 MED ORDER — HYDRALAZINE HCL 20 MG/ML IJ SOLN: 5.0000 mg | Freq: Four times a day (QID) | INTRAMUSCULAR | Status: DC | PRN

## 2019-06-09 MED ORDER — ONDANSETRON HCL 4 MG/2ML IJ SOLN
4.0000 mg | Freq: Once | INTRAMUSCULAR | Status: AC
  Administered 2019-06-09: 4 mg via INTRAVENOUS
  Filled 2019-06-09: qty 2

## 2019-06-09 MED ORDER — HYDROMORPHONE HCL 1 MG/ML IJ SOLN: 1.0000 mg | Freq: Once | INTRAMUSCULAR | Status: DC

## 2019-06-09 MED ORDER — ALUM & MAG HYDROXIDE-SIMETH 200-200-20 MG/5ML PO SUSP
30.0000 mL | Freq: Once | ORAL | Status: AC
  Administered 2019-06-09: 30 mL via ORAL
  Filled 2019-06-09: qty 30

## 2019-06-09 MED ORDER — MORPHINE SULFATE (PF) 4 MG/ML IV SOLN
4.0000 mg | Freq: Once | INTRAVENOUS | Status: AC
  Administered 2019-06-09: 4 mg via INTRAVENOUS
  Filled 2019-06-09: qty 1

## 2019-06-09 MED ORDER — HYDROMORPHONE HCL 1 MG/ML IJ SOLN
0.5000 mg | Freq: Once | INTRAMUSCULAR | Status: AC
  Administered 2019-06-09: 0.5 mg via INTRAVENOUS
  Filled 2019-06-09: qty 1

## 2019-06-09 MED ORDER — LIDOCAINE VISCOUS HCL 2 % MT SOLN
15.0000 mL | Freq: Once | OROMUCOSAL | Status: AC
  Administered 2019-06-09: 15 mL via ORAL
  Filled 2019-06-09: qty 15

## 2019-06-09 MED ORDER — SODIUM CHLORIDE 0.9 % IV SOLN
INTRAVENOUS | Status: DC
  Administered 2019-06-09: 21:00:00 via INTRAVENOUS

## 2019-06-09 MED ORDER — PANTOPRAZOLE SODIUM 40 MG IV SOLR
40.0000 mg | Freq: Once | INTRAVENOUS | Status: AC
  Administered 2019-06-09: 40 mg via INTRAVENOUS
  Filled 2019-06-09: qty 40

## 2019-06-09 MED ORDER — IOHEXOL 350 MG/ML SOLN
75.0000 mL | Freq: Once | INTRAVENOUS | Status: AC | PRN
  Administered 2019-06-09: 75 mL via INTRAVENOUS

## 2019-06-09 MED ORDER — VERAPAMIL HCL ER 240 MG PO TBCR
240.0000 mg | EXTENDED_RELEASE_TABLET | Freq: Every morning | ORAL | Status: DC
  Filled 2019-06-09 (×2): qty 1

## 2019-06-09 MED ORDER — FAMOTIDINE IN NACL 20-0.9 MG/50ML-% IV SOLN
20.0000 mg | Freq: Once | INTRAVENOUS | Status: AC
  Administered 2019-06-09: 20 mg via INTRAVENOUS
  Filled 2019-06-09: qty 50

## 2019-06-09 MED ORDER — ONDANSETRON HCL 4 MG/2ML IJ SOLN
4.0000 mg | Freq: Four times a day (QID) | INTRAMUSCULAR | Status: DC | PRN
  Administered 2019-06-10: 4 mg via INTRAVENOUS
  Filled 2019-06-09: qty 2

## 2019-06-09 MED ORDER — DIPHENHYDRAMINE HCL 50 MG/ML IJ SOLN
25.0000 mg | Freq: Once | INTRAMUSCULAR | Status: AC
  Administered 2019-06-09: 25 mg via INTRAVENOUS
  Filled 2019-06-09: qty 1

## 2019-06-09 MED ORDER — ONDANSETRON HCL 4 MG PO TABS: 4.0000 mg | ORAL_TABLET | Freq: Four times a day (QID) | ORAL | Status: DC | PRN

## 2019-06-09 NOTE — ED Triage Notes (Signed)
Pt reports generalized chest pain that radiates to posterior neck and head with shortness of breath on exertion for about two weeks. Pt states pain was increased this AM upon waking. Pt reports taking oxycodone 5 mg at home at 0715h with no relief. Current pain level is a 10.
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
## 2019-06-09 NOTE — ED Notes (Signed)
Admit doctor at beside.

## 2019-06-09 NOTE — ED Notes (Signed)
Patient returned from CT and states still having pain and requested more pain medication. Provider notified.

## 2019-06-09 NOTE — H&P (Signed)
TRH H&P   Patient Demographics:    Dominique Evans, is a 53 y.o. female  MRN: [id number]   DOB - 10/05/1966  Admit Date - 06/09/2019  Outpatient Primary MD for the patient is [name], Adelfa Kohichard W, MD  Referring MD/NP/PA: PA Tresa EndoKelly  Patient coming from: Home  Chief Complaint  Patient presents with   Chest Pain   Shortness of Breath      HPI:    Dominique Evans  is a 53 y.o. female,  female with medical history significant for type 2 diabetes with neuropathy, asthma, hypertension, hyperlipidemia, GERD, and chronic back pain who presents to the ED with multiple complaints, initially her main concern was chest pain, nausea and vomiting, upon further questioning, her chest pain is related to her nausea and vomiting, reports her nausea and vomiting are progressively worsening over the last week, but significant for last 2 days, ports nonbilious vomiting, nonbloody, has any fever, chills or sick contact reports her chest pain is sharp, only upon vomiting, this symptoms resemble previous episode she had 3 years ago where 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 -In ED troponins were negative, EKG nonacute, CTA chest negative for PE, placed within normal limit, white blood cell, hemoglobin, all within normal limit, I was called to admit for intractable nausea and vomiting    Review of systems:    In addition to the HPI above,  No Fever-chills, No Headache, No changes with Vision or hearing, No problems swallowing food or Liquids, She reports chest pain upon vomiting, no cough or shortness of breath No Abdominal pain, reports nausea and vomiting, bowel movements are regular, No Blood in stool or Urine, No dysuria, No new skin rashes or bruises, No new joints pains-aches,  No new weakness, tingling, numbness in any  extremity, No recent weight gain or loss, No polyuria, polydypsia or polyphagia, No significant Mental Stressors.  A full 10 point Review of Systems was done, except as stated above, all other Review of Systems were negative.   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 where it was done,  about 6 yrs. 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    Obesity    Panic attacks    Peripheral neuropathy    bilateral feet   PONV (postoperative nausea and vomiting)    Psoriasis    Seasonal allergies    Status post dilation of esophageal narrowing    Unspecified essential hypertension    Visual disturbance       Past Surgical History:  Procedure Laterality Date   BOTOX INJECTION N/A 06/07/2015   Procedure: BOTOX INJECTION;  Surgeon: Louis Meckel, MD;  Location: [facility];  Service: Endoscopy;  Laterality: N/A;   COLONOSCOPY WITH PROPOFOL N/A 06/07/2015   Procedure: COLONOSCOPY WITH PROPOFOL;  Surgeon: Louis Meckel, MD;  Location: [facility];  Service: Endoscopy;  Laterality: N/A;    ELBOW FRACTURE SURGERY     left   ESOPHAGEAL MANOMETRY N/A 04/08/2015   Procedure: ESOPHAGEAL MANOMETRY (EM);  Surgeon: Louis Meckel, MD;  Location: [facility];  Service: Endoscopy;  Laterality: N/A;   ESOPHAGOGASTRODUODENOSCOPY (EGD) WITH PROPOFOL N/A 06/07/2015   Procedure: ESOPHAGOGASTRODUODENOSCOPY (EGD) WITH PROPOFOL;  Surgeon: Louis Meckel, MD;  Location: [facility];  Service: Endoscopy;  Laterality: N/A;   GASTRIC ROUX-EN-Y  10/05/2011   Procedure: LAPAROSCOPIC ROUX-EN-Y GASTRIC;  Surgeon: Mariella Saa, MD;  Location: [facility];  Service: General;  Laterality: N/A;  UPPER ENDOSCOPY   lap band removal      LAPAROSCOPIC GASTRIC BANDING  02/2006   w/ truncal vagotomy   LUMBAR LAMINECTOMY/DECOMPRESSION MICRODISCECTOMY Left 11/30/2012   Procedure: MICRO LUMBAR DECOMPRESSION L4-5 ON THE LEFT;  Surgeon: Javier Docker, MD;  Location: [facility];  Service: Orthopedics;  Laterality: Left;  MICRO LUMBAR DECOMPRESSION L4-5 ON THE LEFT   LUMBAR LAMINECTOMY/DECOMPRESSION MICRODISCECTOMY N/A 03/30/2013   Procedure: REDO MICRO LUMBAR DECOMPRESSION L4-5;  Surgeon: Javier Docker, MD;  Location: [facility];  Service: Orthopedics;  Laterality: N/A;   MAXIMUM ACCESS (MAS)POSTERIOR LUMBAR INTERBODY FUSION (PLIF) 1 LEVEL N/A 05/23/2014   Procedure: FOR MAXIMUM ACCESS (MAS) POSTERIOR LUMBAR INTERBODY FUSION (PLIF) Lumbar Four-Five;  Surgeon: Tia Alert, MD;  Location: [facility];  Service: Neurosurgery;  Laterality: N/A;  FOR MAXIMUM ACCESS (MAS) POSTERIOR LUMBAR INTERBODY FUSION (PLIF) Lumbar Four-Five   NEPHRECTOMY  2002   left   PATENT DUCTUS ARTERIOUS REPAIR     SHOULDER ARTHROSCOPY     right shoulder      Social History:     Social History   Tobacco Use   Smoking status: Former Smoker    Packs/day: 2.00    Types: Cigarettes    Quit date: 10/19/1982    Years since quitting: 36.6   Smokeless tobacco: Never Used  Substance Use Topics   Alcohol use: No    Alcohol/week:  0.0 standard drinks      Family History :     Family History  Problem Relation Age of Onset   Hypertension Father    Skin cancer Father    Heart disease Father        before age 68   Heart attack Father  Hyperlipidemia Father    Diabetes Mother    Hypertension Mother    Skin cancer Mother    Breast cancer Mother        Melanoma   Heart disease Mother    Hyperlipidemia Mother    Lung cancer Mother    Brain cancer Mother    Hypertension Sister    Heart disease Sister        before age 74   Heart attack Sister    Hyperlipidemia Sister    Breast cancer Maternal Aunt    Colon cancer Neg Hx    Colon polyps Neg Hx    Esophageal cancer Neg Hx    Gallbladder disease Neg Hx    Stomach cancer Neg Hx      Home Medications:   Prior to Admission medications   Medication Sig Start Date End Date Taking? Authorizing Provider  albuterol (PROVENTIL HFA;VENTOLIN HFA) 108 (90 BASE) MCG/ACT inhaler Inhale 1-2 puffs into the lungs every 4 (four) hours as needed for wheezing or shortness of breath.   Yes [provider]  amitriptyline (ELAVIL) 25 MG tablet Take 50 mg by mouth at bedtime.    Yes [provider]  Apoaequorin (PREVAGEN EXTRA STRENGTH) 20 MG CAPS Take 20 mg by mouth daily with breakfast.   Yes [provider]  Calcium Carb-Cholecalciferol (CALCIUM 500+D3) 500-400 MG-UNIT TABS Take 1 tablet by mouth daily.   Yes [provider]  desvenlafaxine (PRISTIQ) 50 MG 24 hr tablet Take 50 mg by mouth daily.   Yes [provider]  dexlansoprazole (DEXILANT) 60 MG capsule Take 60 mg by mouth 2 (two) times daily.   Yes [provider]  ferrous sulfate 325 (65 FE) MG tablet Take 325 mg by mouth daily with breakfast.   Yes [provider]  fexofenadine (ALLEGRA) 180 MG tablet Take 180 mg by mouth daily.   Yes [provider]  Fluticasone-Salmeterol (ADVAIR DISKUS) 250-50 MCG/DOSE AEPB Inhale 1 puff  into the lungs 2 (two) times daily.    Yes [provider]  furosemide (LASIX) 20 MG tablet Take 20 mg by mouth every morning.    Yes [provider]  losartan (COZAAR) 50 MG tablet Take 50 mg by mouth every morning.  09/21/11  Yes [provider]  montelukast (SINGULAIR) 10 MG tablet Take 10 mg by mouth at bedtime.    Yes [provider]  Multiple Vitamins-Minerals (MULTIVITAMIN PO) Take 1 tablet by mouth daily.    Yes [provider]  oxyCODONE-acetaminophen (PERCOCET/ROXICET) 5-325 MG tablet Take 1 tablet by mouth See admin instructions. Take 1 tablet by mouth four to five times a day   Yes [provider]  polyethylene glycol (MIRALAX) powder Take 17 g by mouth daily.    Yes [provider]  potassium citrate (UROCIT-K) 10 MEQ (1080 MG) SR tablet Take 10 mEq by mouth 3 (three) times daily with meals.    Yes [provider]  pregabalin (LYRICA) 150 MG capsule Take 150 mg by mouth 2 (two) times daily.   Yes [provider]  PRISTIQ 50 MG 24 hr tablet Take 50 mg by mouth daily. 04/20/15  Yes [provider]  rosuvastatin (CRESTOR) 10 MG tablet Take 5 mg by mouth every morning.    Yes [provider]  Secukinumab, 300 MG Dose, (COSENTYX, 300 MG DOSE,) 150 MG/ML SOSY Inject 300 mg into the skin every 30 (thirty) days.   Yes [provider]  verapamil (COVERA HS)  240 MG (CO) 24 hr tablet Take 240 mg by mouth every morning.    Yes [provider]  BAYER CONTOUR TEST test strip 1 each by Other route 2 (two) times a day. DX E11.29 09/23/15   [provider]  lansoprazole (PREVACID) 30 MG capsule Take 1 capsule (30 mg total) by mouth 2 (two) times daily before a meal. Wait at least 30 minutes before eating Patient not taking: Reported on 06/09/2019 09/19/18   Mauri Pole, MD  pantoprazole (PROTONIX) 20 MG tablet Take 1 tablet (20 mg total) by mouth daily. 06/09/19 06/09/19   Albrizze, Harley Hallmark, PA-C     Allergies:     Allergies  Allergen Reactions   Penicillins Anaphylaxis    Has patient had a PCN reaction causing immediate rash, facial/tongue/throat swelling, SOB or lightheadedness with hypotension: yes Has patient had a PCN reaction causing severe rash involving mucus membranes or skin necrosis: unknown Has patient had a PCN reaction that required hospitalization unknown Has patient had a PCN reaction occurring within the last 10 years: no If all of the above answers are "NO", then may proceed with Cephalosporin use.    Sulfonamide Derivatives Anaphylaxis   Clindamycin/Lincomycin Nausea And Vomiting   Ciprofloxacin Hives     Physical Exam:   Vitals  Blood pressure (!) 145/89, pulse 87, temperature 98.6 F (37 C), temperature source Oral, resp. rate 16, last menstrual period 12/21/2015, SpO2 97 %.   1. General well developed  lying in bed in NAD,   2. Normal affect and insight, Not Suicidal or Homicidal, Awake Alert, Oriented X 3.  3. No F.N deficits, ALL C.Nerves Intact, Strength 5/5 all 4 extremities, Sensation intact all 4 extremities, Plantars down going.  4. Ears and Eyes appear Normal, Conjunctivae clear, PERRLA. Moist Oral Mucosa.  5. Supple Neck, No JVD, No cervical lymphadenopathy appriciated, No Carotid Bruits.  6. Symmetrical Chest wall movement, Good air movement bilaterally, CTAB.  7. RRR, No Gallops, Rubs or Murmurs, No Parasternal Heave.  8. Positive Bowel Sounds, Abdomen Soft, No tenderness, No organomegaly appriciated,No rebound -guarding or rigidity.  9.  No Cyanosis, Normal Skin Turgor, No Skin Rash or Bruise.  10. Good muscle tone,  joints appear normal , no effusions, Normal ROM.  11. No Palpable Lymph Nodes in Neck or Axillae    Data Review:    CBC Recent Labs  Lab 06/09/19 1045  WBC 7.8  HGB 13.6  HCT 42.8  PLT 288  MCV 87.7  MCH 27.9  MCHC 31.8  RDW 13.8  LYMPHSABS 2.1  MONOABS 0.5  EOSABS  0.1  BASOSABS 0.0   ------------------------------------------------------------------------------------------------------------------  Chemistries  Recent Labs  Lab 06/09/19 1146  NA 141  K 4.4  CL 107  CO2 23  GLUCOSE 94  BUN 14  CREATININE 0.94  CALCIUM 9.3  AST 24  ALT 20  ALKPHOS 93  BILITOT 0.8   ------------------------------------------------------------------------------------------------------------------ CrCl cannot be calculated (Unknown ideal weight.). ------------------------------------------------------------------------------------------------------------------ No results for input(s): TSH, T4TOTAL, T3FREE, THYROIDAB in the last 72 hours.  Invalid input(s): FREET3  Coagulation profile No results for input(s): INR, PROTIME in the last 168 hours. ------------------------------------------------------------------------------------------------------------------- Recent Labs    06/09/19 1205  DDIMER 0.56*   -------------------------------------------------------------------------------------------------------------------  Cardiac Enzymes No results for input(s): CKMB, TROPONINI, MYOGLOBIN in the last 168 hours.  Invalid input(s): CK ------------------------------------------------------------------------------------------------------------------ No results found for: BNP   ---------------------------------------------------------------------------------------------------------------  Urinalysis    Component Value Date/Time   COLORURINE STRAW (A) 06/09/2019 1735   APPEARANCEUR  CLEAR 06/09/2019 1735   LABSPEC 1.026 06/09/2019 1735   PHURINE 7.0 06/09/2019 1735   GLUCOSEU NEGATIVE 06/09/2019 1735   HGBUR NEGATIVE 06/09/2019 1735   BILIRUBINUR NEGATIVE 06/09/2019 1735   KETONESUR 20 (A) 06/09/2019 1735   PROTEINUR NEGATIVE 06/09/2019 1735   NITRITE NEGATIVE 06/09/2019 1735   LEUKOCYTESUR NEGATIVE 06/09/2019 1735     ----------------------------------------------------------------------------------------------------------------   Imaging Results:    Ct Angio Chest Pe W And/or Wo Contrast  Result Date: 06/09/2019 CLINICAL DATA:  PE suspected, intermediate probability, positive D-dimer EXAM: CT ANGIOGRAPHY CHEST WITH CONTRAST TECHNIQUE: Multidetector CT imaging of the chest was performed using the standard protocol during bolus administration of intravenous contrast. Multiplanar CT image reconstructions and MIPs were obtained to evaluate the vascular anatomy. CONTRAST:  75mL OMNIPAQUE IOHEXOL 350 MG/ML SOLN COMPARISON:  CT PA Feb 22, 2018 FINDINGS: Cardiovascular: Satisfactory opacification of the pulmonary arteries to the segmental level. No evidence of pulmonary embolism. Normal heart size. No pericardial effusion. Right-sided aortic arch, normal caliber. 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 Trachea is unremarkable. Thyroid and thoracic inlet are free of significant abnormality. Lungs/Pleura: No consolidation, features of edema, pneumothorax, or effusion. Calcified granuloma in the right middle lobe. Upper Abdomen: No acute abnormalities present in the visualized portions of the upper abdomen. Gastrojejunostomy anastomosis related to Roux-en-Y, as above. Musculoskeletal: Stable angulation at the thoracolumbar junction. No acute osseous abnormality or suspicious osseous lesion. Review of the MIP images confirms the above findings. IMPRESSION: No evidence of pulmonary embolism to the segmental level. 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 Right-sided aortic arch. Electronically Signed    By: Kreg ShropshirePrice  DeHay M.D.   On: 06/09/2019 15:17   Dg Chest Portable 1 View  Result Date: 06/09/2019 CLINICAL DATA:  Chest pain. EXAM: PORTABLE CHEST 1 VIEW COMPARISON:  Radiograph April 27, 2019. FINDINGS: The heart size and mediastinal contours are within normal limits. Both lungs are clear. No pneumothorax or pleural effusion is noted. The visualized skeletal structures are unremarkable. IMPRESSION: No active disease. Electronically Signed   By: Lupita RaiderJames  Green Jr M.D.   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 -We will obtain esophagus barium study evaluate for any stricture in the esophagus, discussed with radiology, they will try to evaluate for gastroduodenal junction as well, to be done with water-soluble contrast as patient will have EGD in a.m.Marland Kitchen. -GI consulted by ED, plan for endoscopy in a.m., they requested patient to be n.p.o. today as well, to avoid any food accumulation complicating procedure. -We will keep on IV fluids, n.p.o., PRN nausea and pain meds.  Type 2 diabetes mellitus -We will continue  with insulin sliding scale.  Chest pain -Nontypical, clearly related to vomiting, negative cardiac enzymes, EKG nonacute, CTA chest with no PE.  Asthma -Stable, no wheezing, continue with Advair, Singulair and albuterol as needed  Hypertension -Continue with losartan and  verapamil  Hyperlipidemia -resume statin after procedure tomorrow  GERD -Continue with PPI  Chronic back pain: - Status post decompressive lumbar laminectomy L4-5 and PLIF L4-5. -Resume meds after procedure tomorrow  Depression: -Resume meds tomorrow after procedure   DVT Prophylaxis  SCDs   AM Labs Ordered, also please review Full Orders  Family Communication: Admission, patients condition and plan of care including tests being ordered have been discussed with the patient who indicate understanding and agree with the plan and Code Status.  Code Status full  Likely DC to  home  Condition GUARDED    Consults called: GI dr Loreta AveMann called by ED  Admission status: observation,   Time spent in minutes : 60 minutes   Huey Bienenstockawood Aury Scollard M.D on 06/09/2019 at 6:52 PM  Between 7am to 7pm - Pager - [phone]. After 7pm go to [email or web address] - password [email or web address]  [facility] - Office  [phone]

## 2019-06-09 NOTE — ED Notes (Signed)
Patient ambulated to the bathroom and returned steady gait. Patient heart rate increased 110-140 provider notified.

## 2019-06-09 NOTE — ED Notes (Signed)
Attempted report 

## 2019-06-09 NOTE — ED Notes (Signed)
Transported to radiology 

## 2019-06-09 NOTE — Discharge Instructions (Addendum)
Subjective:     Nausea, Adult Nausea is feeling sick to your stomach or feeling that you are about to throw up (vomit). Feeling sick to your stomach is usually not serious, but it may be an early sign of a more serious medical problem. As you feel sicker to your stomach, you may throw up. If you throw up, or if you are not able to drink enough fluids, there is a risk that you may lose too much water in your body (get dehydrated). If you lose too much water in your body, you may:  Feel tired.  Feel thirsty.  Have a dry mouth.  Have cracked lips.  Go pee (urinate) less often. Older adults and people who have other diseases or a weak body defense system (immune system) have a higher risk of losing too much water in the body. The main goals of treating this condition are:  To relieve your nausea.  To ensure your nausea occurs less often.  To prevent throwing up and losing too much fluid. Follow these instructions at home: Watch your symptoms for any changes. Tell your doctor about them. Follow these instructions as told by your doctor. Eating and drinking      Take an ORS (oral rehydration solution). This is a drink that is sold at pharmacies and stores.  Drink clear fluids in small amounts as you are able. These include: ? Water. ? Ice chips. ? Fruit juice that has water added (diluted fruit juice). ? Low-calorie sports drinks.  Eat bland, easy-to-digest foods in small amounts as you are able, such as: ? Bananas. ? Applesauce. ? Rice. ? Low-fat (lean) meats. ? Toast. ? Crackers.  Avoid drinking fluids that have a lot of sugar or caffeine in them. This includes energy drinks, sports drinks, and soda.  Avoid alcohol.  Avoid spicy or fatty foods. General instructions  Take over-the-counter and prescription medicines only as told by your doctor.  Rest at home while you get better.  Drink enough fluid to keep your pee (urine) pale yellow.  Take slow and deep  breaths when you feel sick to your stomach.  Avoid food or things that have strong smells.  Wash your hands often with soap and water. If you cannot use soap and water, use hand sanitizer.  Make sure that all people in your home wash their hands well and often.  Keep all follow-up visits as told by your doctor. This is important. Contact a doctor if:  You feel sicker to your stomach.  You feel sick to your stomach for more than 2 days.  You throw up.  You are not able to drink fluids without throwing up.  You have new symptoms.  You have a fever.  You have a headache.  You have muscle cramps.  You have a rash.  You have pain while peeing.  You feel light-headed or dizzy. Get help right away if:  You have pain in your chest, neck, arm, or jaw.  You feel very weak or you pass out (faint).  You have throw up that is bright red or looks like coffee grounds.  You have bloody or black poop (stools) or poop that looks like tar.  You have a very bad headache, a stiff neck, or both.  You have very bad pain, cramping, or bloating in your belly (abdomen).  You have trouble breathing or you are breathing very quickly.  Your heart is beating very quickly.  Your skin feels cold and clammy.  You feel confused.  You have signs of losing too much water in your body, such as: ? Dark pee, very little pee, or no pee. ? Cracked lips. ? Dry mouth. ? Sunken eyes. ? Sleepiness. ? Weakness. These symptoms may be an emergency. Do not wait to see if the symptoms will go away. Get medical help right away. Call your local emergency services (911 in the [place] Do not drive yourself to the hospital. Summary  Nausea is feeling sick to your stomach or feeling that you are about to throw up (vomit).  If you throw up, or if you are not able to drink enough fluids, there is a risk that you may lose too much water in your body (get dehydrated).  Eat and drink what your doctor tells  you. Take over-the-counter and prescription medicines only as told by your doctor.  Contact a doctor right away if your symptoms get worse or you have new symptoms.  Keep all follow-up visits as told by your doctor. This is important. This information is not intended to replace advice given to you by your health care provider. Make sure you discuss any questions you have with your health care provider. Document Released: 09/24/2011 Document Revised: 03/15/2018 Document Reviewed: 03/15/2018 Elsevier Patient Education  2020 [employer]

## 2019-06-09 NOTE — ED Provider Notes (Signed)
[facility] EMERGENCY DEPARTMENT Provider Note   CSN: [id number] Arrival date & time: 06/09/19  4098     History   Chief Complaint Chief Complaint  Patient presents with   Chest Pain   Shortness of Breath    HPI Dominique Evans is a 53 y.o. female with past medical history of anemia, arthritis, depression, GERD, hiatal hernia, gastric bypass, hyperlipidemia, peripheral neuropathy presents emergency department today with chief complaint of shortness of breath x2 weeks.  Patient states it has progressively worsened since onset.  She states it has been constant.  It is worse with exertion.  She is also reporting right-sided chest pain that radiates to posterior neck.  She describes chest pain as sharp.  She states pain is worse when taking a deep breath. She is also reporting nausea and non bloody, non bilious emesis. She has had 2 episodes in the last 24 hours.  Denies any sick contacts.  Also denies fever, chills, abdominal pain, urinary symptoms, diarrhea.  Denies contact with anyone positive for COVID-19.   Chart review shows patient had stress test 3 years ago within normal LVEF.   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  where it was done,  about 6 yrs. 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    Obesity    Panic attacks    Peripheral neuropathy    bilateral feet   PONV (postoperative nausea and vomiting)    Psoriasis    Seasonal allergies    Status post dilation of esophageal narrowing    Unspecified essential hypertension    Visual disturbance     Patient Active Problem List   Diagnosis Date Noted   Cellulitis of left foot 04/27/2019   Hyperlipidemia 02/16/2019   Chronic pain 09/24/2018   Incisional hernia 09/24/2018   Low back pain 09/24/2018   Major depressive disorder with single episode 09/24/2018   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   Esophageal dysmotility 03/01/2015   Colon cancer screening 03/01/2015   Lateral ventral hernia 03/01/2015   S/P lumbar spinal fusion 05/23/2014   Edema 06/28/2013   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   HNP (herniated nucleus pulposus), lumbar 11/30/2012   THORACIC AORTIC ANEURYSM 10/23/2010   CONSTIPATION, CHRONIC 03/23/2010   OBESITY, UNSPECIFIED 10/31/2007   OBSTRUCTIVE SLEEP APNEA 10/31/2007   CHRONIC RHINITIS 10/31/2007   Cough variant asthma 10/31/2007   GERD 10/31/2007   Type  2 diabetes mellitus with foot ulcer (HCC) 10/04/2007   Hypertension associated with diabetes (HCC) 10/04/2007   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   NEPHRECTOMY, HX OF 10/04/2007    Past Surgical History:  Procedure Laterality Date   BOTOX INJECTION N/A 06/07/2015   Procedure: BOTOX INJECTION;  Surgeon: Louis Meckelobert D Kaplan, MD;  Location: [facility];  Service: Endoscopy;  Laterality: N/A;   COLONOSCOPY WITH PROPOFOL N/A 06/07/2015   Procedure: COLONOSCOPY WITH PROPOFOL;  Surgeon: Louis Meckelobert D Kaplan, MD;  Location: [facility];  Service: Endoscopy;  Laterality: N/A;   ELBOW FRACTURE SURGERY     left   ESOPHAGEAL MANOMETRY N/A  04/08/2015   Procedure: ESOPHAGEAL MANOMETRY (EM);  Surgeon: Louis Meckelobert D Kaplan, MD;  Location: [facility];  Service: Endoscopy;  Laterality: N/A;   ESOPHAGOGASTRODUODENOSCOPY (EGD) WITH PROPOFOL N/A 06/07/2015   Procedure: ESOPHAGOGASTRODUODENOSCOPY (EGD) WITH PROPOFOL;  Surgeon: Louis Meckelobert D Kaplan, MD;  Location: [facility];  Service: Endoscopy;  Laterality: N/A;   GASTRIC ROUX-EN-Y  10/05/2011   Procedure: LAPAROSCOPIC ROUX-EN-Y GASTRIC;  Surgeon: Mariella SaaBenjamin T Hoxworth, MD;  Location: [facility];  Service: General;  Laterality: N/A;  UPPER ENDOSCOPY   lap band removal      LAPAROSCOPIC GASTRIC BANDING  02/2006   w/ truncal vagotomy   LUMBAR LAMINECTOMY/DECOMPRESSION MICRODISCECTOMY Left 11/30/2012   Procedure: MICRO LUMBAR DECOMPRESSION L4-5 ON THE LEFT;  Surgeon: Javier DockerJeffrey C Beane, MD;  Location: [facility];  Service: Orthopedics;  Laterality: Left;  MICRO LUMBAR DECOMPRESSION L4-5 ON THE LEFT   LUMBAR LAMINECTOMY/DECOMPRESSION MICRODISCECTOMY N/A 03/30/2013   Procedure: REDO MICRO LUMBAR DECOMPRESSION L4-5;  Surgeon: Javier DockerJeffrey C Beane, MD;  Location: [facility];  Service: Orthopedics;  Laterality: N/A;   MAXIMUM ACCESS (MAS)POSTERIOR LUMBAR INTERBODY FUSION (PLIF) 1 LEVEL N/A 05/23/2014   Procedure: FOR MAXIMUM ACCESS (MAS) POSTERIOR LUMBAR INTERBODY FUSION (PLIF) Lumbar Four-Five;  Surgeon: Tia Alertavid S Jones, MD;  Location: [facility];  Service: Neurosurgery;  Laterality: N/A;  FOR MAXIMUM ACCESS (MAS) POSTERIOR LUMBAR INTERBODY FUSION (PLIF) Lumbar Four-Five   NEPHRECTOMY  2002   left   PATENT DUCTUS ARTERIOUS REPAIR     SHOULDER ARTHROSCOPY     right shoulder     OB History   No obstetric history on file.      Home Medications    Prior to Admission medications   Medication Sig Start Date End Date Taking? Authorizing Provider  albuterol (PROVENTIL HFA;VENTOLIN HFA) 108 (90 BASE) MCG/ACT inhaler Inhale 1-2 puffs into the lungs every 4 (four) hours as needed for wheezing or shortness of breath.    Yes [provider]  amitriptyline (ELAVIL) 25 MG tablet Take 50 mg by mouth at bedtime.    Yes [provider]  Apoaequorin (PREVAGEN EXTRA STRENGTH) 20 MG CAPS Take 20 mg by mouth daily with breakfast.   Yes [provider]  Calcium Carb-Cholecalciferol (CALCIUM 500+D3) 500-400 MG-UNIT TABS Take 1 tablet by mouth daily.   Yes [provider]  desvenlafaxine (PRISTIQ) 50 MG 24 hr tablet Take 50 mg by mouth daily.   Yes [provider]  dexlansoprazole (DEXILANT) 60 MG capsule Take 60 mg by mouth 2 (two) times daily.   Yes [provider]  ferrous sulfate 325 (65 FE) MG tablet Take 325 mg by mouth daily with breakfast.   Yes [provider]  fexofenadine (ALLEGRA) 180 MG tablet Take 180 mg by mouth daily.   Yes [provider]  Fluticasone-Salmeterol (ADVAIR DISKUS) 250-50 MCG/DOSE AEPB Inhale 1 puff into the lungs 2 (two) times  daily.    Yes [provider]  furosemide (LASIX) 20 MG tablet Take 20 mg by mouth every morning.    Yes [provider]  losartan (COZAAR) 50 MG tablet Take 50 mg by mouth every morning.  09/21/11  Yes [provider]  montelukast (SINGULAIR) 10 MG tablet Take 10 mg by mouth at bedtime.    Yes [provider]  Multiple Vitamins-Minerals (MULTIVITAMIN PO) Take 1 tablet by mouth daily.    Yes [provider]  oxyCODONE-acetaminophen (PERCOCET/ROXICET) 5-325 MG tablet Take 1 tablet by mouth See admin instructions. Take 1 tablet by mouth four to five times a day   Yes [provider]  polyethylene glycol (MIRALAX) powder Take 17 g by mouth daily.    Yes [provider]  potassium citrate (UROCIT-K) 10 MEQ (1080 MG) SR tablet Take 10 mEq by mouth 3 (three) times daily with meals.    Yes [provider]  pregabalin (LYRICA) 150 MG capsule Take 150 mg by mouth 2 (two) times daily.   Yes [provider]  PRISTIQ 50 MG 24 hr tablet  Take 50 mg by mouth daily. 04/20/15  Yes [provider]  rosuvastatin (CRESTOR) 10 MG tablet Take 5 mg by mouth every morning.    Yes [provider]  Secukinumab, 300 MG Dose, (COSENTYX, 300 MG DOSE,) 150 MG/ML [facility] Inject 300 mg into the skin every 30 (thirty) days.   Yes [provider]  verapamil (COVERA HS) 240 MG (CO) 24 hr tablet Take 240 mg by mouth every morning.    Yes [provider]  BAYER CONTOUR TEST test strip 1 each by Other route 2 (two) times a day. DX E11.29 09/23/15   [provider]  lansoprazole (PREVACID) 30 MG capsule Take 1 capsule (30 mg total) by mouth 2 (two) times daily before a meal. Wait at least 30 minutes before eating Patient not taking: Reported on 06/09/2019 09/19/18   Mauri Pole, MD  pantoprazole (PROTONIX) 20 MG tablet Take 1 tablet (20 mg total) by mouth daily. 06/09/19 06/09/19  Aundreya Souffrant, Harley Hallmark, PA-C    Family History Family History  Problem Relation Age of Onset   Hypertension Father    Skin cancer Father    Heart disease Father        before age 52   Heart attack Father    Hyperlipidemia Father    Diabetes Mother    Hypertension Mother    Skin cancer Mother    Breast cancer Mother        Melanoma   Heart disease Mother    Hyperlipidemia Mother    Lung cancer Mother    Brain cancer Mother    Hypertension Sister    Heart disease Sister        before age 62   Heart attack Sister    Hyperlipidemia Sister    Breast cancer Maternal Aunt    Colon cancer Neg Hx    Colon polyps Neg Hx    Esophageal cancer Neg Hx    Gallbladder disease Neg Hx    Stomach cancer Neg Hx     Social History Social History   Tobacco Use   Smoking status: Former Smoker    Packs/day: 2.00    Types: Cigarettes    Quit date: 10/19/1982    Years since quitting: 36.6   Smokeless tobacco: Never Used  Substance Use Topics   Alcohol use: No    Alcohol/week: 0.0 standard drinks  Drug  use: No     Allergies   Penicillins, Sulfonamide derivatives, Clindamycin/lincomycin, and Ciprofloxacin   Review of Systems Review of Systems  Constitutional: Negative for chills and fever.  HENT: Negative for congestion, ear discharge, ear pain, sinus pressure, sinus pain and sore throat.   Eyes: Negative for pain and redness.  Respiratory: Positive for shortness of breath. Negative for cough.   Cardiovascular: Positive for chest pain.  Gastrointestinal: Positive for nausea and vomiting. Negative for abdominal pain, constipation and diarrhea.  Genitourinary: Negative for dysuria and hematuria.  Musculoskeletal: Positive for neck pain. Negative for back pain.  Skin: Negative for wound.  Neurological: Negative for weakness, numbness and headaches.     Physical Exam Updated Vital Signs BP (!) 159/86 (BP Location: Right Arm)    Pulse 80    Temp 98.4 F (36.9 C) (Oral)    Resp 15    LMP 12/21/2015    SpO2 99%   Physical Exam Vitals signs and nursing note reviewed.  Constitutional:      Appearance: She is not ill-appearing, toxic-appearing or diaphoretic.     Comments: Patient appears uncomfortable, in no acute distress.  HENT:     Head: Normocephalic and atraumatic.     Right Ear: Tympanic membrane and external ear normal.     Left Ear: Tympanic membrane and external ear normal.     Nose: Nose normal.     Mouth/Throat:     Mouth: Mucous membranes are dry.     Pharynx: Oropharynx is clear.  Eyes:     General: No scleral icterus.       Right eye: No discharge.        Left eye: No discharge.     Extraocular Movements: Extraocular movements intact.     Conjunctiva/sclera: Conjunctivae normal.     Pupils: Pupils are equal, round, and reactive to light.  Neck:     Musculoskeletal: Normal range of motion. No muscular tenderness.     Vascular: No JVD.  Cardiovascular:     Rate and Rhythm: Normal rate and regular rhythm.     Pulses: Normal pulses.          Radial pulses are  2+ on the right side and 2+ on the left side.     Heart sounds: Normal heart sounds.  Pulmonary:     Comments: Pt is speaking in full sentences. Symmetric chest rise. Lungs clear to auscultation in all fields. SpO2 is 99% on room air. Chest:     Chest wall: Tenderness present.  Abdominal:     Comments: Abdomen is soft, non-distended, and non-tender in all quadrants. No rigidity, no guarding. No peritoneal signs.  Musculoskeletal: Normal range of motion.     Comments: Homans sign absent bilaterally, no lower extremity edema, no palpable cords, compartments are soft  Skin:    General: Skin is warm and dry.     Capillary Refill: Capillary refill takes less than 2 seconds.  Neurological:     Mental Status: She is oriented to person, place, and time.     GCS: GCS eye subscore is 4. GCS verbal subscore is 5. GCS motor subscore is 6.     Comments: Fluent speech, no facial droop.  Psychiatric:        Behavior: Behavior normal.      ED Treatments / Results  Labs (all labs ordered are listed, but only abnormal results are displayed) Labs Reviewed  D-DIMER, QUANTITATIVE (NOT AT [facility]) - Abnormal; Notable for the  following components:      Result Value   D-Dimer, Quant 0.56 (*)    All other components within normal limits  SARS CORONAVIRUS 2 (HOSPITAL ORDER, PERFORMED IN [facility])  CBC WITH DIFFERENTIAL/PLATELET  COMPREHENSIVE METABOLIC PANEL  LIPASE, BLOOD  URINALYSIS, ROUTINE W REFLEX MICROSCOPIC  RAPID URINE DRUG SCREEN, HOSP PERFORMED  TROPONIN I (HIGH SENSITIVITY)  TROPONIN I (HIGH SENSITIVITY)  TROPONIN I (HIGH SENSITIVITY)    EKG EKG Interpretation  Date/Time:  Friday June 09 2019 10:03:27 EDT Ventricular Rate:  84 PR Interval:    QRS Duration: 97 QT Interval:  394 QTC Calculation: 466 R Axis:   111 Text Interpretation:  Sinus rhythm Rightward axis Confirmed by Cathren LaineSteinl, Kevin ([id number]) on 06/09/2019 10:15:26 AM   Radiology Ct Angio Chest Pe W And/or Wo  Contrast  Result Date: 06/09/2019 CLINICAL DATA:  PE suspected, intermediate probability, positive D-dimer EXAM: CT ANGIOGRAPHY CHEST WITH CONTRAST TECHNIQUE: Multidetector CT imaging of the chest was performed using the standard protocol during bolus administration of intravenous contrast. Multiplanar CT image reconstructions and MIPs were obtained to evaluate the vascular anatomy. CONTRAST:  75mL OMNIPAQUE IOHEXOL 350 MG/ML SOLN COMPARISON:  CT PA Feb 22, 2018 FINDINGS: Cardiovascular: Satisfactory opacification of the pulmonary arteries to the segmental level. No evidence of pulmonary embolism. Normal heart size. No pericardial effusion. Right-sided aortic arch, normal caliber. 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 Trachea is unremarkable. Thyroid and thoracic inlet are free of significant abnormality. Lungs/Pleura: No consolidation, features of edema, pneumothorax, or effusion. Calcified granuloma in the right middle lobe. Upper Abdomen: No acute abnormalities present in the visualized portions of the upper abdomen. Gastrojejunostomy anastomosis related to Roux-en-Y, as above. Musculoskeletal: Stable angulation at the thoracolumbar junction. No acute osseous abnormality or suspicious osseous lesion. Review of the MIP images confirms the above findings. IMPRESSION: No evidence of pulmonary embolism to the segmental level. 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 Right-sided aortic arch. Electronically Signed   By: Kreg ShropshirePrice  DeHay M.D.   On: 06/09/2019 15:17   Dg Chest Portable 1 View  Result Date: 06/09/2019 CLINICAL DATA:  Chest pain. EXAM: PORTABLE CHEST 1 VIEW COMPARISON:   Radiograph April 27, 2019. FINDINGS: The heart size and mediastinal contours are within normal limits. Both lungs are clear. No pneumothorax or pleural effusion is noted. The visualized skeletal structures are unremarkable. IMPRESSION: No active disease. Electronically Signed   By: Lupita RaiderJames  Green Jr M.D.   On: 06/09/2019 11:04    Procedures Procedures (including critical care time)  Medications Ordered in ED Medications  LORazepam (ATIVAN) injection 1 mg (has no administration in time range)  sodium chloride 0.9 % bolus 1,000 mL (0 mLs Intravenous Stopped 06/09/19 1348)  ondansetron (ZOFRAN) injection 4 mg (4 mg Intravenous Given 06/09/19 1057)  albuterol (VENTOLIN HFA) 108 (90 Base) MCG/ACT inhaler 1-2 puff (2 puffs Inhalation Given 06/09/19 1127)  morphine 4 MG/ML injection 4 mg (4 mg Intravenous Given 06/09/19 1057)  famotidine (PEPCID) IVPB 20 mg premix (0 mg Intravenous Stopped 06/09/19 1200)  diphenhydrAMINE (BENADRYL) injection 25 mg (25 mg Intravenous Given 06/09/19 1124)  alum & mag hydroxide-simeth (MAALOX/MYLANTA) 200-200-20 MG/5ML suspension 30 mL (30 mLs Oral Given 06/09/19 1202)    And  lidocaine (XYLOCAINE) 2 %  viscous mouth solution 15 mL (15 mLs Oral Given 06/09/19 1202)  HYDROmorphone (DILAUDID) injection 0.5 mg (0.5 mg Intravenous Given 06/09/19 1345)  iohexol (OMNIPAQUE) 350 MG/ML injection 75 mL (75 mLs Intravenous Contrast Given 06/09/19 1507)  HYDROmorphone (DILAUDID) injection 0.5 mg (0.5 mg Intravenous Given 06/09/19 1617)     Initial Impression / Assessment and Plan / ED Course  I have reviewed the triage vital signs and the nursing notes.  Pertinent labs & imaging results that were available during my care of the patient were reviewed by me and considered in my medical decision making (see chart for details).  Patient presents to the ED with complaints of shortness of breath and chest pain. Patient nontoxic appearing, in no apparent distress, vitals WNL. On exam lungs are  clear to auscultation in all fields. She has mild tenderness to right chest wall. No abdominal tenderness appreciated. Will evaluate with labs, EKG, chest xray. Pt given morphine for pain. She had localized rash on right arm around IV site. I immediately evaluated the pt, airway is intact, no difficulty breathing. Benadryl and pepcid given. On reassessment rash improved, airway remains intact. Pt has no known allergy to morphine.  Labs reviewed and grossly unremarkable. No leukocytosis, no anemia, no significant electrolyte derangements. LFTs, renal function, and lipase WNL. Urinalysis without obvious infection. Troponin is negative and EKG without ischemic changes, doubt ACS etiology as pain has been present x2 weeks. Pain seems pleuritic and pt is overall poor historian so D-dimer ordered and is slightly elevated at .56. CTA chest is negative for PE. Chest xray viewed by me is also negative for infiltrate, doubt pneumonia.  Pt reports minimal pain provement after GI cocktail and Dilaudid. Patient unable to tolerate PO intake.  When p.o. challenging patient states pain significantly worsens.  Her heart rate increased to 140-150. This is shared encounter with ED attending Dr. Denton Lank. He has personally seen and  evaluated the patient.  Patient care transferred to K. Gekas PA-C at the end of my shift. Patient presentation, ED course, and plan of care discussed with review of all pertinent labs and imaging. Please see her note for further details regarding further ED course and disposition. Possible admission and CT abdomen pelvis.    This note was prepared using Dragon voice recognition software and may include unintentional dictation errors due to the inherent limitations of voice recognition software.    Final Clinical Impressions(s) / ED Diagnoses   Final diagnoses:  Atypical chest pain  Shortness of breath    ED Discharge Orders         Ordered    pantoprazole (PROTONIX) 20 MG tablet  Daily,    Status:  Discontinued     06/09/19 1556           Terralyn Matsumura, Caroleen Hamman, PA-C 06/09/19 1715    Cathren Laine, MD 06/10/19 [phone]

## 2019-06-09 NOTE — ED Notes (Signed)
Patient states when attempting oral challenge patient chest pain improved with pain medication 4/10 pressure and while drinking increased to 6/10 pressure.

## 2019-06-09 NOTE — ED Notes (Signed)
Pt experienced localized reaction with redness and warmth at right peripheral IV site following morphine IV administration. Provider examined site. Applied ice, admin benadryl. Redness and warmth decreased, no pain at site.

## 2019-06-09 NOTE — ED Notes (Signed)
Pt ambulated to restroom with steady gait Tolerated well 

## 2019-06-09 NOTE — ED Provider Notes (Signed)
53 year old female with 2 weeks of chest pain and vomiting after PO intake. She has a hx of chronic GERD and gastric bypass roux-en-y surgery. She has seen GI before due to dysphagia issues. 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 She reports significant weight loss over the past 2 weeks. She has been unable to take her meds and can only do small sips of water. CTA of chest was obtained to r/o PE and this was normal. It did show some esophagitis. I re-examined the patient's abdomen and it was benign but she is having persistent symptoms. I discussed with the hospitalist and they are requesting a barium swallow study. I talked to Dr. Tery Sanfilippo who will perform this tonight. I also talked to Dr. Collene Mares with [facility] and she is requesting pt be made NPO and she will do a EGD in the morning. [facility] to admit.   Recardo Evangelist, PA-C 06/09/19 1924    Virgel Manifold, MD 06/12/19 [phone]

## 2019-06-09 NOTE — ED Notes (Signed)
Provider at bedside

## 2019-06-10 DIAGNOSIS — K228 Other specified diseases of esophagus: Secondary | ICD-10-CM | POA: Diagnosis present

## 2019-06-10 DIAGNOSIS — F329 Major depressive disorder, single episode, unspecified: Secondary | ICD-10-CM | POA: Diagnosis present

## 2019-06-10 DIAGNOSIS — Z20828 Contact with and (suspected) exposure to other viral communicable diseases: Secondary | ICD-10-CM | POA: Diagnosis present

## 2019-06-10 DIAGNOSIS — E1140 Type 2 diabetes mellitus with diabetic neuropathy, unspecified: Secondary | ICD-10-CM | POA: Diagnosis not present

## 2019-06-10 DIAGNOSIS — I152 Hypertension secondary to endocrine disorders: Secondary | ICD-10-CM | POA: Diagnosis present

## 2019-06-10 DIAGNOSIS — K224 Dyskinesia of esophagus: Secondary | ICD-10-CM | POA: Diagnosis present

## 2019-06-10 DIAGNOSIS — Z6830 Body mass index (BMI) 30.0-30.9, adult: Secondary | ICD-10-CM | POA: Diagnosis not present

## 2019-06-10 DIAGNOSIS — R1319 Other dysphagia: Secondary | ICD-10-CM

## 2019-06-10 DIAGNOSIS — E785 Hyperlipidemia, unspecified: Secondary | ICD-10-CM | POA: Diagnosis present

## 2019-06-10 DIAGNOSIS — K449 Diaphragmatic hernia without obstruction or gangrene: Secondary | ICD-10-CM | POA: Diagnosis present

## 2019-06-10 DIAGNOSIS — K210 Gastro-esophageal reflux disease with esophagitis: Secondary | ICD-10-CM | POA: Diagnosis present

## 2019-06-10 DIAGNOSIS — K2210 Ulcer of esophagus without bleeding: Secondary | ICD-10-CM | POA: Diagnosis present

## 2019-06-10 DIAGNOSIS — K219 Gastro-esophageal reflux disease without esophagitis: Secondary | ICD-10-CM

## 2019-06-10 DIAGNOSIS — K222 Esophageal obstruction: Secondary | ICD-10-CM | POA: Diagnosis present

## 2019-06-10 DIAGNOSIS — J45909 Unspecified asthma, uncomplicated: Secondary | ICD-10-CM | POA: Diagnosis present

## 2019-06-10 DIAGNOSIS — R4189 Other symptoms and signs involving cognitive functions and awareness: Secondary | ICD-10-CM | POA: Diagnosis present

## 2019-06-10 DIAGNOSIS — E1169 Type 2 diabetes mellitus with other specified complication: Secondary | ICD-10-CM | POA: Diagnosis not present

## 2019-06-10 DIAGNOSIS — G4733 Obstructive sleep apnea (adult) (pediatric): Secondary | ICD-10-CM | POA: Diagnosis present

## 2019-06-10 DIAGNOSIS — R1314 Dysphagia, pharyngoesophageal phase: Secondary | ICD-10-CM | POA: Diagnosis present

## 2019-06-10 DIAGNOSIS — R0789 Other chest pain: Secondary | ICD-10-CM | POA: Diagnosis present

## 2019-06-10 DIAGNOSIS — G8929 Other chronic pain: Secondary | ICD-10-CM | POA: Diagnosis present

## 2019-06-10 DIAGNOSIS — R1310 Dysphagia, unspecified: Secondary | ICD-10-CM

## 2019-06-10 HISTORY — DX: Other dysphagia: R13.19

## 2019-06-10 LAB — BASIC METABOLIC PANEL
Anion gap: 11 (ref 5–15)
BUN: 11 mg/dL (ref 6–20)
CO2: 23 mmol/L (ref 22–32)
Calcium: 9.1 mg/dL (ref 8.9–10.3)
Chloride: 107 mmol/L (ref 98–111)
Creatinine, Ser: 0.83 mg/dL (ref 0.44–1.00)
GFR calc Af Amer: >60 mL/min (ref >60)
GFR calc non Af Amer: >60 mL/min (ref >60)
Glucose, Bld: 81 mg/dL (ref 70–99)
Potassium: 3.8 mmol/L (ref 3.5–5.1)
Sodium: 141 mmol/L (ref 135–145)

## 2019-06-10 LAB — GLUCOSE, CAPILLARY
Glucose-Capillary: 104 mg/dL — ABNORMAL HIGH (ref 70–99)
Glucose-Capillary: 69 mg/dL — ABNORMAL LOW (ref 70–99)
Glucose-Capillary: 83 mg/dL (ref 70–99)
Glucose-Capillary: 88 mg/dL (ref 70–99)
Glucose-Capillary: 96 mg/dL (ref 70–99)

## 2019-06-10 LAB — CBC
HCT: 36 % (ref 36.0–46.0)
Hemoglobin: 11.6 g/dL — ABNORMAL LOW (ref 12.0–15.0)
MCH: 28 pg (ref 26.0–34.0)
MCHC: 32.2 g/dL (ref 30.0–36.0)
MCV: 86.7 fL (ref 80.0–100.0)
Platelets: 243 10*3/uL (ref 150–400)
RBC: 4.15 MIL/uL (ref 3.87–5.11)
RDW: 13.7 % (ref 11.5–15.5)
WBC: 6.7 10*3/uL (ref 4.0–10.5)
nRBC: 0 % (ref 0.0–0.2)

## 2019-06-10 LAB — RAPID URINE DRUG SCREEN, HOSP PERFORMED
Amphetamines: NOT DETECTED
Barbiturates: NOT DETECTED
Benzodiazepines: NOT DETECTED
Cocaine: NOT DETECTED
Opiates: POSITIVE — AB
Tetrahydrocannabinol: NOT DETECTED

## 2019-06-10 LAB — TROPONIN I (HIGH SENSITIVITY): Troponin I (High Sensitivity): 82 ng/L — ABNORMAL HIGH (ref <18)

## 2019-06-10 MED ORDER — DEXTROSE-NACL 5-0.9 % IV SOLN
INTRAVENOUS | Status: DC
  Administered 2019-06-10 – 2019-06-11 (×3): via INTRAVENOUS

## 2019-06-10 MED ORDER — ACETAMINOPHEN 10 MG/ML IV SOLN
1000.0000 mg | Freq: Four times a day (QID) | INTRAVENOUS | Status: AC
  Administered 2019-06-10 – 2019-06-11 (×4): 1000 mg via INTRAVENOUS
  Filled 2019-06-10 (×4): qty 100

## 2019-06-10 MED ORDER — DEXTROSE 50 % IV SOLN
INTRAVENOUS | Status: AC
  Administered 2019-06-10: 25 mL
  Filled 2019-06-10: qty 50

## 2019-06-10 MED ORDER — ACETAMINOPHEN 325 MG PO TABS
650.0000 mg | ORAL_TABLET | Freq: Four times a day (QID) | ORAL | Status: DC | PRN
  Administered 2019-06-10: 650 mg via ORAL
  Filled 2019-06-10 (×2): qty 2

## 2019-06-10 MED ORDER — INSULIN ASPART 100 UNIT/ML ~~LOC~~ SOLN: 0.0000 [IU] | SUBCUTANEOUS | Status: DC

## 2019-06-10 NOTE — Progress Notes (Signed)
PROGRESS NOTE    Dominique Evans  ZOX:[id number] DOB: 01/19/1966 DOA: 06/09/2019 PCP: Gaspar Garbeisovec, Richard W, MD   Brief Narrative:  Dominique Evans, femalewith medical history significant fortype 2 diabetes with neuropathy, asthma, hypertension, hyperlipidemia, GERD, and chronic back pain who presented to the ED with multiple complaints, initially her main concern was chest pain, nausea and vomiting, upon further questioning, her chest pain was seemed to be related to her nausea and vomiting which are progressively worsening over the last week, but significant for last 2 days, reports nonbilious vomiting, nonbloody. 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 -In ED troponins were negative, EKG nonacute, CTA chest negative for PE, placed within normal limit, white blood cell, hemoglobin, all within normal limit GI was called and ED physician spoke to Dr. Loreta AveMann from [facility] who recommended admission for EGD in the morning and keep patient n.p.o.  Hospital service was then consulted to admit the patient.  Assessment & Plan:   Active Problems:   Type 2 diabetes mellitus with foot ulcer (HCC)   OBSTRUCTIVE SLEEP APNEA   Hypertension associated with diabetes (HCC)   GERD   H/O gastric bypass   Esophageal dysmotility   Hyperlipidemia   Intractable nausea and vomiting   Intractable nausea and vomiting: Improved.  No nausea or vomiting.  Likely secondary to esophageal stenosis/stricture.  Barium swallow also shows that.  GI on board.  She is going to have EGD at some point in time today.  Time unknown.  Type 2 diabetes mellitus: Last hemoglobin A1c in July 2020 was 5.9.  Will start on SSI.  Chest pain: This was atypical and likely secondary to stenosis/stricture.  This is resolved.  Troponins negative.  EKG negative.  Asthma: Stable.  Continue current  medications.  Essential hypertension: Controlled.  Continue current medications.  Hyperlipidemia: Continue statin.  GERD: Continue PPI.  DVT prophylaxis: SCD Code Status: Full code Family Communication: None present.  Plan of care discussed with patient and she verbalized understanding. Disposition Plan: TBD.  Pending GI evaluation  Consultants:   GI  Procedures:   None  Antimicrobials:   None   Subjective: Seen and examined.  She feels better.  Only complaint she has is occipital headache.  Objective: Vitals:   06/09/19 1900 06/09/19 2029 06/10/19 0421 06/10/19 0955  BP: (!) 167/81 (!) 166/76 131/76 121/64  Pulse: 95 96 77 87  Resp:  18 17   Temp:  99.3 F (37.4 C) 98.2 F (36.8 C)   TempSrc:  Oral Oral   SpO2: 99% 100% 97% 100%    Intake/Output Summary (Last 24 hours) at 06/10/2019 1034 Last data filed at 06/10/2019 0900 Gross per 24 hour  Intake 616.29 ml  Output --  Net 616.29 ml   There were no vitals filed for this visit.  Examination:  General exam: Appears calm and comfortable  Respiratory system: Clear to auscultation. Respiratory effort normal. Cardiovascular system: S1 & S2 heard, RRR. No JVD, murmurs, rubs, gallops or clicks. No pedal edema. Gastrointestinal system: Abdomen is nondistended, soft and nontender. No organomegaly or masses felt. Normal bowel sounds heard. Central nervous system: Alert and oriented. No focal neurological deficits. Extremities: Symmetric 5 x 5 power. Skin: No rashes, lesions or ulcers Psychiatry: Judgement and insight appear normal. Mood & affect appropriate.    Data Reviewed: I have personally reviewed following labs and imaging  studies  CBC: Recent Labs  Lab 06/09/19 1045 06/10/19 0258  WBC 7.8 6.7  NEUTROABS 5.0  --   HGB 13.6 11.6*  HCT 42.8 36.0  MCV 87.7 86.7  PLT 288 243   Basic Metabolic Panel: Recent Labs  Lab 06/09/19 1146 06/10/19 0258  NA 141 141  K 4.4 3.8  CL 107 107  CO2 23 23   GLUCOSE 94 81  BUN 14 11  CREATININE 0.94 0.83  CALCIUM 9.3 9.1   GFR: CrCl cannot be calculated (Unknown ideal weight.). Liver Function Tests: Recent Labs  Lab 06/09/19 1146  AST 24  ALT 20  ALKPHOS 93  BILITOT 0.8  PROT 7.4  ALBUMIN 3.9   Recent Labs  Lab 06/09/19 1146  LIPASE 24   No results for input(s): AMMONIA in the last 168 hours. Coagulation Profile: No results for input(s): INR, PROTIME in the last 168 hours. Cardiac Enzymes: No results for input(s): CKTOTAL, CKMB, CKMBINDEX, TROPONINI in the last 168 hours. BNP (last 3 results) No results for input(s): PROBNP in the last 8760 hours. HbA1C: No results for input(s): HGBA1C in the last 72 hours. CBG: No results for input(s): GLUCAP in the last 168 hours. Lipid Profile: No results for input(s): CHOL, HDL, LDLCALC, TRIG, CHOLHDL, LDLDIRECT in the last 72 hours. Thyroid Function Tests: No results for input(s): TSH, T4TOTAL, FREET4, T3FREE, THYROIDAB in the last 72 hours. Anemia Panel: No results for input(s): VITAMINB12, FOLATE, FERRITIN, TIBC, IRON, RETICCTPCT in the last 72 hours. Sepsis Labs: No results for input(s): PROCALCITON, LATICACIDVEN in the last 168 hours.  Recent Results (from the past 240 hour(s))  SARS Coronavirus 2 [facility](Hospital order, Performed in [facility] lab) Nasopharyngeal Nasopharyngeal Swab     Status: None   Collection Time: 06/09/19  5:45 PM   Specimen: Nasopharyngeal Swab  Result Value Ref Range Status   SARS Coronavirus 2 NEGATIVE NEGATIVE Final    Comment: (NOTE) If result is NEGATIVE SARS-CoV-2 target nucleic acids are NOT DETECTED. The SARS-CoV-2 RNA is generally detectable in upper and lower  respiratory specimens during the acute phase of infection. The lowest  concentration of SARS-CoV-2 viral copies this assay can detect is 250  copies / mL. A negative result does not preclude SARS-CoV-2 infection  and should not be used as the sole basis for treatment or other   patient management decisions.  A negative result may occur with  improper specimen collection / handling, submission of specimen other  than nasopharyngeal swab, presence of viral mutation(s) within the  areas targeted by this assay, and inadequate number of viral copies  (<250 copies / mL). A negative result must be combined with clinical  observations, patient history, and epidemiological information. If result is POSITIVE SARS-CoV-2 target nucleic acids are DETECTED. The SARS-CoV-2 RNA is generally detectable in upper and lower  respiratory specimens dur ing the acute phase of infection.  Positive  results are indicative of active infection with SARS-CoV-2.  Clinical  correlation with patient history and other diagnostic information is  necessary to determine patient infection status.  Positive results do  not rule out bacterial infection or co-infection with other viruses. If result is PRESUMPTIVE POSTIVE SARS-CoV-2 nucleic acids MAY BE PRESENT.   A presumptive positive result was obtained on the submitted specimen  and confirmed on repeat testing.  While 2019 novel coronavirus  (SARS-CoV-2) nucleic acids may be present in the submitted sample  additional confirmatory testing may be necessary for epidemiological  and / or clinical  management purposes  to differentiate between  SARS-CoV-2 and other Sarbecovirus currently known to infect humans.  If clinically indicated additional testing with an alternate test  methodology [phone] is advised. The SARS-CoV-2 RNA is generally  detectable in upper and lower respiratory sp ecimens during the acute  phase of infection. The expected result is Negative. Fact Sheet for Patients:  BoilerBrush.com.cyhttps://www.fda.gov/media/136312/download Fact Sheet for Healthcare Providers: https://pope.com/https://www.fda.gov/media/136313/download This test is not yet approved or cleared by the [place] FDA and has been authorized for detection and/or diagnosis of SARS-CoV-2  by FDA under an Emergency Use Authorization (EUA).  This EUA will remain in effect (meaning this test can be used) for the duration of the COVID-19 declaration under Section 564(b)(1) of the Act, 21 U.S.C. section 360bbb-3(b)(1), unless the authorization is terminated or revoked sooner. Performed at [facility], [address] [place], [place] [address]       Radiology Studies: Ct Angio Chest Pe W And/or Wo Contrast  Result Date: 06/09/2019 CLINICAL DATA:  PE suspected, intermediate probability, positive D-dimer EXAM: CT ANGIOGRAPHY CHEST WITH CONTRAST TECHNIQUE: Multidetector CT imaging of the chest was performed using the standard protocol during bolus administration of intravenous contrast. Multiplanar CT image reconstructions and MIPs were obtained to evaluate the vascular anatomy. CONTRAST:  75mL OMNIPAQUE IOHEXOL 350 MG/ML SOLN COMPARISON:  CT PA Feb 22, 2018 FINDINGS: Cardiovascular: Satisfactory opacification of the pulmonary arteries to the segmental level. No evidence of pulmonary embolism. Normal heart size. No pericardial effusion. Right-sided aortic arch, normal caliber. 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 Trachea is unremarkable. Thyroid and thoracic inlet are free of significant abnormality. Lungs/Pleura: No consolidation, features of edema, pneumothorax, or effusion. Calcified granuloma in the right middle lobe. Upper Abdomen: No acute abnormalities present in the visualized portions of the upper abdomen. Gastrojejunostomy anastomosis related to Roux-en-Y, as above. Musculoskeletal: Stable angulation at the thoracolumbar junction. No acute osseous abnormality or suspicious osseous lesion. Review of the MIP images confirms the above findings. IMPRESSION: No evidence of pulmonary embolism to the segmental  level. 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 Right-sided aortic arch. Electronically Signed   By: Kreg ShropshirePrice  DeHay M.D.   On: 06/09/2019 15:17   Dg Chest Portable 1 View  Result Date: 06/09/2019 CLINICAL DATA:  Chest pain. EXAM: PORTABLE CHEST 1 VIEW COMPARISON:  Radiograph April 27, 2019. FINDINGS: The heart size and mediastinal contours are within normal limits. Both lungs are clear. No pneumothorax or pleural effusion is noted. The visualized skeletal structures are unremarkable. IMPRESSION: No active disease. Electronically Signed   By: Lupita RaiderJames  Green Jr M.D.   On: 06/09/2019 11:04   Dg Esophagus W Single Cm (sol Or Thin Ba)  Result Date: 06/09/2019 CLINICAL DATA:  Painful swallowing.  Status post gastric bypass. EXAM: ESOPHOGRAM/BARIUM SWALLOW TECHNIQUE: Single contrast examination was performed using water-soluble contrast material. FLUOROSCOPY TIME:  Fluoroscopy Time:  3 minutes and 12 seconds. Radiation Exposure Index (if provided by the fluoroscopic device): 103 mGy Number of Acquired Spot Images: COMPARISON:  CTA chest from earlier today. FINDINGS: Limited study in that the patient experienced marked pain with swallowing which limited ability to obtain esophageal distention. As seen on CT earlier today, the patient has a small hiatal hernia which probably contains  a portion of the gastroenteric anastomosis. No evidence for a gross flow limiting esophageal stricture although there is some narrowing at the esophagogastric junction. Flow of contrast through the gastrojejunostomy was sluggish and there is some apparent narrowing/stricturing at the anastomosis. Flow of contrast in the Roux limb is very sluggish and no substantial peristalsis was visualized. After extended observation, minimal transit of contrast in the Roux limb was observed.  IMPRESSION: 1. Patient experienced rather marked pain with swallowing. 2. Small hiatal hernia containing the gastric remnant and probably a component of the gastroenteric anastomosis. Contrast passage through the anastomosis is sluggish and the anastomosis appears narrowed. Little peristalsis in the Roux limb was observed although it is nondilated. Electronically Signed   By: Misty Stanley M.D.   On: 06/09/2019 20:14    Scheduled Meds:  losartan  50 mg Oral q morning - 10a   verapamil  240 mg Oral q morning - 10a   Continuous Infusions:  sodium chloride 75 mL/hr at 06/10/19 0500     LOS: 0 days   Time spent: 34 minutes   Darliss Cheney, MD [facility] Pager [phone]  If 7PM-7AM, please contact night-coverage [email or web address] Password [facility] 06/10/2019, 10:34 AM

## 2019-06-10 NOTE — Consult Note (Addendum)
CROSS COVER [facility] Reason for Consult: Nausea and vomiting for 2 weeks. Referring Physician: THP  Jaydn Fincher is an 53 y.o. female.  HPI: [name] is a 54 year old white female with multiple medical problems listed below.  She was admitted through the emergency room to [facility] last night with a 2-week history of nausea and vomiting.  Her history significant for gastric Roux-en-Y bypass surgery done in 2007.  She claims she weighed 312 at her highest weight and lost on 283 after a Roux-en-Y surgery she was evaluated by Dr. Erskine Emery 2016 for dysphasia when she was found to have a significantly dilated esophagus and a patent gastrojejunostomy.. She developed recurrent symptoms in 2017 when she had an EGD done by Dr. 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  A large bezoar was also removed by a Roth net from the gastric remnant.  She claims she was doing well till about 2 weeks ago when she started developing recurrent symptoms with nausea vomiting and chest discomfort. She repeatedly tells me she has had problems with her memory and is unable to give me details on history and room does not remember dates correctly. She gives a history of having and had a head injury after a fall and developing problems with memory since then. There is no history of melena hematochezia.  She has a chronic history of chronic constipation and acid reflux for which she is on PPIs. She has been doing fairly well from a GI standpoint since her endoscopy in 2017 till 2 weeks ago.  Patient had a barium swallow done after on admission was found to have marked pain with swallowing and a small gastric remnant with sluggish passage of contrast to the gastroenteric anastomosis the Roux-en-Y limb was not dilated. CTA done to evaluate chest pain showed no evidence of pulmonary embolism a large patulous esophagus was noted  with ?esophagitis.  Past Medical History:  Diagnosis Date  . Allergic rhinitis   . Anemia   . Arthritis    spondylolisthesis- lumbar region  . Asthma    PFT 08/12/07: FEV1 2.97 (104%), FEV1% 84, TLC 4.89 (90%), DLCO 84%, +BD  . Blood transfusion    hx of 2002   . Blood transfusion without reported diagnosis    kidney, no issues  . Chronic constipation   . 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 Cough 03/23/13  . Depression   . Diabetes mellitus    pre gastric bypass  . Esophageal dysmotility 03/01/2015  . GERD (gastroesophageal reflux disease)   . H/O hiatal hernia   . Head injury due to trauma    age 34, went under car while sledding, had memory loss at that time  . 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 History of stress test    exercise stress test, ? where it was done,  about 6 yrs. ago,  results- negative   . Hydatidiform mole   . Hyperlipidemia   . Hypertension   . 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 Obesity   . Panic attacks   . Peripheral neuropathy    bilateral feet  . PONV (postoperative nausea and vomiting)   . Psoriasis   . Seasonal allergies   . Status post dilation of esophageal narrowing   .  Unspecified essential hypertension   . Visual disturbance    Past Surgical History:  Procedure Laterality Date  . BOTOX INJECTION N/A 06/07/2015   Procedure: BOTOX INJECTION;  Surgeon: Louis Meckelobert D Kaplan, MD;  Location: [facility];  Service: Endoscopy;  Laterality: N/A;  . COLONOSCOPY WITH PROPOFOL N/A 06/07/2015   Procedure: COLONOSCOPY WITH PROPOFOL;  Surgeon: Louis Meckelobert D Kaplan, MD;  Location: [facility];  Service: Endoscopy;  Laterality: N/A;  . ELBOW FRACTURE SURGERY     left  . ESOPHAGEAL MANOMETRY N/A 04/08/2015   Procedure: ESOPHAGEAL MANOMETRY (EM);  Surgeon: Louis Meckelobert D Kaplan, MD;  Location: [facility];  Service: Endoscopy;  Laterality: N/A;  . ESOPHAGOGASTRODUODENOSCOPY (EGD) WITH PROPOFOL  N/A 06/07/2015   Procedure: ESOPHAGOGASTRODUODENOSCOPY (EGD) WITH PROPOFOL;  Surgeon: Louis Meckelobert D Kaplan, MD;  Location: [facility];  Service: Endoscopy;  Laterality: N/A;  . 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 lap band removal     . LAPAROSCOPIC GASTRIC BANDING  02/2006   w/ truncal vagotomy  . LUMBAR LAMINECTOMY/DECOMPRESSION MICRODISCECTOMY Left 11/30/2012   Procedure: MICRO LUMBAR DECOMPRESSION L4-5 ON THE LEFT;  Surgeon: Javier DockerJeffrey C Beane, MD;  Location: [facility];  Service: Orthopedics;  Laterality: Left;  MICRO LUMBAR DECOMPRESSION L4-5 ON THE LEFT  . LUMBAR LAMINECTOMY/DECOMPRESSION MICRODISCECTOMY N/A 03/30/2013   Procedure: REDO MICRO LUMBAR DECOMPRESSION L4-5;  Surgeon: Javier DockerJeffrey C Beane, MD;  Location: [facility];  Service: Orthopedics;  Laterality: N/A;  . MAXIMUM ACCESS (MAS)POSTERIOR LUMBAR INTERBODY FUSION (PLIF) 1 LEVEL N/A 05/23/2014   Procedure: FOR MAXIMUM ACCESS (MAS) POSTERIOR LUMBAR INTERBODY FUSION (PLIF) Lumbar Four-Five;  Surgeon: Tia Alertavid S Jones, MD;  [facility];  Service: Neurosurgery;  Laterality: N/A;  FOR MAXIMUM ACCESS (MAS) POSTERIOR LUMBAR INTERBODY FUSION (PLIF) Lumbar Four-Five  . NEPHRECTOMY  2002   left  . PATENT DUCTUS ARTERIOUS REPAIR    . SHOULDER ARTHROSCOPY     right shoulder   Family History  Problem Relation Age of Onset  . Hypertension Father   . Skin cancer Father   . Heart disease Father        before age 55  . Heart attack Father   . Hyperlipidemia Father   . Diabetes Mother   . Hypertension Mother   . Skin cancer Mother   . Breast cancer Mother        Melanoma  . Heart disease Mother   . Hyperlipidemia Mother   . Lung cancer Mother   . Brain cancer Mother   . Hypertension Sister   . Heart disease Sister        before age 55  . Heart attack Sister   . Hyperlipidemia Sister   . Breast  cancer Maternal Aunt   . Colon cancer Neg Hx   . Colon polyps Neg Hx   . Esophageal cancer Neg Hx   . Gallbladder disease Neg Hx   . Stomach cancer Neg Hx    Social History:  reports that she quit smoking about 36 years ago. Her smoking use included cigarettes. She smoked 2.00 packs per day. She has never used smokeless tobacco. She reports that she does not drink alcohol or use drugs.  Allergies:  Allergies  Allergen Reactions  . Penicillins Anaphylaxis    Has patient had a PCN reaction causing immediate rash, facial/tongue/throat swelling, SOB or lightheadedness with hypotension: yes Has patient had a PCN  reaction causing severe rash involving mucus membranes or skin necrosis: unknown Has patient had a PCN reaction that required hospitalization unknown Has patient had a PCN reaction occurring within the last 10 years: no If all of the above answers are "NO", then may proceed with Cephalosporin use.   . Sulfonamide Derivatives Anaphylaxis  . Clindamycin/Lincomycin Nausea And Vomiting  . Ciprofloxacin Hives   Medications: I have reviewed the patient's current medications.  Results for orders placed or performed during the hospital encounter of 06/09/19 (from the past 48 hour(s))  CBC with Differential     Status: None   Collection Time: 06/09/19 10:45 AM  Result Value Ref Range   WBC 7.8 4.0 - 10.5 K/uL   RBC 4.88 3.87 - 5.11 MIL/uL   Hemoglobin 13.6 12.0 - 15.0 g/dL   HCT 16.1 09.6 - 04.5 %   MCV 87.7 80.0 - 100.0 fL   MCH 27.9 26.0 - 34.0 pg   MCHC 31.8 30.0 - 36.0 g/dL   RDW 40.9 81.1 - 91.4 %   Platelets 288 150 - 400 K/uL   nRBC 0.0 0.0 - 0.2 %   Neutrophils Relative % 64 %   Neutro Abs 5.0 1.7 - 7.7 K/uL   Lymphocytes Relative 27 %   Lymphs Abs 2.1 0.7 - 4.0 K/uL   Monocytes Relative 7 %   Monocytes Absolute 0.5 0.1 - 1.0 K/uL   Eosinophils Relative 1 %   Eosinophils Absolute 0.1 0.0 - 0.5 K/uL   Basophils Relative 1 %   Basophils Absolute 0.0 0.0 - 0.1 K/uL    Immature Granulocytes 0 %   Abs Immature Granulocytes 0.01 0.00 - 0.07 K/uL    Comment: Performed at [facility], [address] [place], [place] [address]  Comprehensive metabolic panel     Status: None   Collection Time: 06/09/19 11:46 AM  Result Value Ref Range   Sodium 141 135 - 145 mmol/L   Potassium 4.4 3.5 - 5.1 mmol/L   Chloride 107 98 - 111 mmol/L   CO2 23 22 - 32 mmol/L   Glucose, Bld 94 70 - 99 mg/dL   BUN 14 6 - 20 mg/dL   Creatinine, Ser 6.21 0.44 - 1.00 mg/dL   Calcium 9.3 8.9 - 30.8 mg/dL   Total Protein 7.4 6.5 - 8.1 g/dL   Albumin 3.9 3.5 - 5.0 g/dL   AST 24 15 - 41 U/L   ALT 20 0 - 44 U/L   Alkaline Phosphatase 93 38 - 126 U/L   Total Bilirubin 0.8 0.3 - 1.2 mg/dL   GFR calc non Af Amer >60 >60 mL/min   GFR calc Af Amer >60 >60 mL/min   Anion gap 11 5 - 15    Comment: Performed at [facility], [address], [place], [place] [address]  Lipase, blood     Status: None   Collection Time: 06/09/19 11:46 AM  Result Value Ref Range   Lipase 24 11 - 51 U/L    Comment: Performed at [facility], [address], [place], [place] [address]  Troponin I (High Sensitivity)     Status: None   Collection Time: 06/09/19 11:46 AM  Result Value Ref Range   Troponin I (High Sensitivity) 5 <18 ng/L    Comment: (NOTE) Elevated high sensitivity troponin I (hsTnI) values and significant  changes across serial measurements may suggest ACS but many other  chronic and acute conditions are known to elevate hsTnI  results.  Refer to the "Links" section for chest pain algorithms and additional  guidance. Performed at [facility], [address] [place], [place] [address]   D-dimer, quantitative (not at [facility])     Status: Abnormal   Collection Time: 06/09/19 12:05 PM  Result Value Ref Range   D-Dimer, Quant 0.56 (H) 0.00 - 0.50 ug/mL-FEU    Comment: (NOTE) At the manufacturer cut-off of 0.50 ug/mL FEU, this assay has been documented to exclude PE with a  sensitivity and negative predictive value of 97 to 99%.  At this time, this assay has not been approved by the FDA to exclude DVT/VTE. Results should be correlated with clinical presentation. Performed at [facility], [address] [place], [place] [address]   Urinalysis, Routine w reflex microscopic     Status: Abnormal   Collection Time: 06/09/19  5:35 PM  Result Value Ref Range   Color, Urine STRAW (A) YELLOW   APPearance CLEAR CLEAR   Specific Gravity, Urine 1.026 1.005 - 1.030   pH 7.0 5.0 - 8.0   Glucose, UA NEGATIVE NEGATIVE mg/dL   Hgb urine dipstick NEGATIVE NEGATIVE   Bilirubin Urine NEGATIVE NEGATIVE   Ketones, ur 20 (A) NEGATIVE mg/dL   Protein, ur NEGATIVE NEGATIVE mg/dL   Nitrite NEGATIVE NEGATIVE   Leukocytes,Ua NEGATIVE NEGATIVE    Comment: Performed at [facility], [address] [place], [place] [address]  SARS Coronavirus 2 [facility](Hospital order, Performed in [facility] hospital lab) Nasopharyngeal Nasopharyngeal Swab     Status: None   Collection Time: 06/09/19  5:45 PM   Specimen: Nasopharyngeal Swab  Result Value Ref Range   SARS Coronavirus 2 NEGATIVE NEGATIVE    Comment: (NOTE) If result is NEGATIVE SARS-CoV-2 target nucleic acids are NOT DETECTED. The SARS-CoV-2 RNA is generally detectable in upper and lower  respiratory specimens during the acute phase of infection. The lowest  concentration of SARS-CoV-2 viral copies this assay can detect is 250  copies / mL. A negative result does not preclude SARS-CoV-2 infection  and should not be used as the sole basis for treatment or other  patient management decisions.  A negative result may occur with  improper specimen collection / handling, submission of specimen other  than nasopharyngeal swab, presence of viral mutation(s) within the  areas targeted by this assay, and inadequate number of viral copies  (<250 copies / mL). A negative result must be combined with clinical  observations, patient  history, and epidemiological information. If result is POSITIVE SARS-CoV-2 target nucleic acids are DETECTED. The SARS-CoV-2 RNA is generally detectable in upper and lower  respiratory specimens dur ing the acute phase of infection.  Positive  results are indicative of active infection with SARS-CoV-2.  Clinical  correlation with patient history and other diagnostic information is  necessary to determine patient infection status.  Positive results do  not rule out bacterial infection or co-infection with other viruses. If result is PRESUMPTIVE POSTIVE SARS-CoV-2 nucleic acids MAY BE PRESENT.   A presumptive positive result was obtained on the submitted specimen  and confirmed on repeat testing.  While 2019 novel coronavirus  (SARS-CoV-2) nucleic acids may be present in the submitted sample  additional confirmatory testing may be necessary for epidemiological  and / or clinical management purposes  to differentiate between  SARS-CoV-2 and other Sarbecovirus currently known to infect humans.  If clinically indicated additional testing with an alternate test  methodology [phone]) is advised. The SARS-CoV-2  RNA is generally  detectable in upper and lower respiratory sp ecimens during the acute  phase of infection. The expected result is Negative. Fact Sheet for Patients:  BoilerBrush.com.cyhttps://www.fda.gov/media/136312/download Fact Sheet for Healthcare Providers: https://pope.com/https://www.fda.gov/media/136313/download This test is not yet approved or cleared by the [place] FDA and has been authorized for detection and/or diagnosis of SARS-CoV-2 by FDA under an Emergency Use Authorization (EUA).  This EUA will remain in effect (meaning this test can be used) for the duration of the COVID-19 declaration under Section 564(b)(1) of the Act, 21 U.S.C. section 360bbb-3(b)(1), unless the authorization is terminated or revoked sooner. Performed at [facility], [address] [place], [place] [address]    Troponin I (High Sensitivity)     Status: Abnormal   Collection Time: 06/09/19  6:00 PM  Result Value Ref Range   Troponin I (High Sensitivity) 82 (H) <18 ng/L    Comment: CRITICAL RESULT CALLED TO, READ BACK BY AND VERIFIED WITH: S CRUZ RN AT 1938 ON [id number] BY K FORSYTH DELTA CHECK NOTED (NOTE) Elevated high sensitivity troponin I (hsTnI) values and significant  changes across serial measurements may suggest ACS but many other  chronic and acute conditions are known to elevate hsTnI results.  Refer to the Links section for chest pain algorithms and additional  guidance. Performed at [facility], [address] [place], [place] [address]   Basic metabolic panel     Status: None   Collection Time: 06/10/19  2:58 AM  Result Value Ref Range   Sodium 141 135 - 145 mmol/L   Potassium 3.8 3.5 - 5.1 mmol/L   Chloride 107 98 - 111 mmol/L   CO2 23 22 - 32 mmol/L   Glucose, Bld 81 70 - 99 mg/dL   BUN 11 6 - 20 mg/dL   Creatinine, Ser 5.620.83 0.44 - 1.00 mg/dL   Calcium 9.1 8.9 - 13.010.3 mg/dL   GFR calc non Af Amer >60 >60 mL/min   GFR calc Af Amer >60 >60 mL/min   Anion gap 11 5 - 15    Comment: Performed at [facility], [address] [place], [place] [address]  CBC     Status: Abnormal   Collection Time: 06/10/19  2:58 AM  Result Value Ref Range   WBC 6.7 4.0 - 10.5 K/uL   RBC 4.15 3.87 - 5.11 MIL/uL   Hemoglobin 11.6 (L) 12.0 - 15.0 g/dL   HCT 46.936.0 62.936.0 - 52.846.0 %   MCV 86.7 80.0 - 100.0 fL   MCH 28.0 26.0 - 34.0 pg   MCHC 32.2 30.0 - 36.0 g/dL   RDW 41.313.7 24.411.5 - 01.015.5 %   Platelets 243 150 - 400 K/uL   nRBC 0.0 0.0 - 0.2 %    Comment: Performed at [facility], [address] [place], [place] [address]    Ct Angio Chest Pe W And/or Wo Contrast  Result Date: 06/09/2019 CLINICAL DATA:  PE suspected, intermediate probability, positive D-dimer EXAM: CT ANGIOGRAPHY CHEST WITH CONTRAST TECHNIQUE: Multidetector CT imaging of the chest was performed using the  standard protocol during bolus administration of intravenous contrast. Multiplanar CT image reconstructions and MIPs were obtained to evaluate the vascular anatomy. CONTRAST:  75mL OMNIPAQUE IOHEXOL 350 MG/ML SOLN COMPARISON:  CT PA Feb 22, 2018 FINDINGS: Cardiovascular: Satisfactory opacification of the pulmonary arteries to the segmental level. No evidence of pulmonary embolism. Normal heart size. No pericardial effusion. Right-sided aortic arch, normal caliber. Mediastinum/Nodes:  There is a thickened and patulous thoracic esophagus with a moderate hiatal hernia and postsurgical changes seen at the diaphragmatic hiatus likely related to prior Roux-en-Y gastric bypass. Trachea is unremarkable. Thyroid and thoracic inlet are free of significant abnormality. Lungs/Pleura: No consolidation, features of edema, pneumothorax, or effusion. Calcified granuloma in the right middle lobe. Upper Abdomen: No acute abnormalities present in the visualized portions of the upper abdomen. Gastrojejunostomy anastomosis related to Roux-en-Y, as above. Musculoskeletal: Stable angulation at the thoracolumbar junction. No acute osseous abnormality or suspicious osseous lesion. Review of the MIP images confirms the above findings. IMPRESSION: No evidence of pulmonary embolism to the segmental level. 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 Right-sided aortic arch. Electronically Signed   By: Kreg Shropshire M.D.   On: 06/09/2019 15:17   Dg Chest Portable 1 View  Result Date: 06/09/2019 CLINICAL DATA:  Chest pain. EXAM: PORTABLE CHEST 1 VIEW COMPARISON:  Radiograph April 27, 2019. FINDINGS: The heart size and mediastinal contours are within normal limits. Both lungs are clear. No pneumothorax or pleural effusion is noted. The visualized skeletal structures are unremarkable.  IMPRESSION: No active disease. Electronically Signed   By: Lupita Raider M.D.   On: 06/09/2019 11:04   Dg Esophagus W Single Cm (sol Or Thin Ba)  Result Date: 06/09/2019 CLINICAL DATA:  Painful swallowing.  Status post gastric bypass. EXAM: ESOPHOGRAM/BARIUM SWALLOW TECHNIQUE: Single contrast examination was performed using water-soluble contrast material. FLUOROSCOPY TIME:  Fluoroscopy Time:  3 minutes and 12 seconds. Radiation Exposure Index (if provided by the fluoroscopic device): 103 mGy Number of Acquired Spot Images: COMPARISON:  CTA chest from earlier today. FINDINGS: Limited study in that the patient experienced marked pain with swallowing which limited ability to obtain esophageal distention. As seen on CT earlier today, the patient has a small hiatal hernia which probably contains a portion of the gastroenteric anastomosis. No evidence for a gross flow limiting esophageal stricture although there is some narrowing at the esophagogastric junction. Flow of contrast through the gastrojejunostomy was sluggish and there is some apparent narrowing/stricturing at the anastomosis. Flow of contrast in the Roux limb is very sluggish and no substantial peristalsis was visualized. After extended observation, minimal transit of contrast in the Roux limb was observed. IMPRESSION: 1. Patient experienced rather marked pain with swallowing. 2. Small hiatal hernia containing the gastric remnant and probably a component of the gastroenteric anastomosis. Contrast passage through the anastomosis is sluggish and the anastomosis appears narrowed. Little peristalsis in the Roux limb was observed although it is nondilated. Electronically Signed   By: Kennith Center M.D.   On: 06/09/2019 20:14   ROS Blood pressure 131/76, pulse 77, temperature 98.2 F (36.8 C), temperature source Oral, resp. rate 17, last menstrual period 12/21/2015, SpO2 97 %. Physical Exam  Constitutional: She is oriented to person, place, and time.  She appears well-developed and well-nourished.  HENT:  Head: Normocephalic and atraumatic.  Eyes: Pupils are equal, round, and reactive to light. Conjunctivae and EOM are normal.  Neck: No JVD present. No tracheal deviation present. No thyromegaly present.  Cardiovascular: Normal rate and regular rhythm.  Respiratory: Effort normal and breath sounds normal.  GI: Soft. Bowel sounds are normal. She exhibits no distension. There is no abdominal tenderness. There is no rebound and no guarding.  Musculoskeletal: Normal range of motion.  Lymphadenopathy:    She has no  cervical adenopathy.  Neurological: She is alert and oriented to person, place, and time.  Skin: Skin is warm and dry.  Psychiatric: She has a normal mood and affect. Her behavior is normal.   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  An EGD is planned for tomorrow for possible dilation. 2) GERD on PPI's/esophageal dilation on previous 2 endoscopies with dysmotility diagnosed on esophageal manometry.  I was unable to find the manometry report in epic. 3) Chronic constipation. 4) Hypertension/Hyperlipidemia/congenital heart disease s/p surgery . 5) allergic rhinitis/asthma/cough. 6) Status post head injury with memory problems 3)  Zareena Willis 06/10/2019, 8:12 AM

## 2019-06-10 NOTE — Progress Notes (Addendum)
Called [facility] to follow up on patient's plans for endoscopy today. On call assistant states they will reach our to Dr. Collene Mares. Awaiting call back..   1038 Dr. Collene Mares at the bedside. States patient will go tomorrow 8/23 for endoscopy. She can have sips and ice chips.

## 2019-06-10 NOTE — Progress Notes (Signed)
Wasted Morphine 1 ML with Davina Poke, Therapist, sports.

## 2019-06-11 ENCOUNTER — Encounter (HOSPITAL_COMMUNITY)
Admission: EM | Disposition: A | Discharge status: Home / Self Care | Payer: Self-pay | Source: Home / Self Care | Attending: Family Medicine

## 2019-06-11 ENCOUNTER — Encounter (HOSPITAL_COMMUNITY): Payer: Self-pay | Admitting: *Deleted

## 2019-06-11 ENCOUNTER — Inpatient Hospital Stay (HOSPITAL_COMMUNITY): Payer: No Typology Code available for payment source | Admitting: Anesthesiology

## 2019-06-11 DIAGNOSIS — R1310 Dysphagia, unspecified: Secondary | ICD-10-CM

## 2019-06-11 HISTORY — PX: ESOPHAGOGASTRODUODENOSCOPY (EGD) WITH PROPOFOL: SHX5813

## 2019-06-11 HISTORY — PX: BIOPSY: SHX5522

## 2019-06-11 LAB — GLUCOSE, CAPILLARY
Glucose-Capillary: 78 mg/dL (ref 70–99)
Glucose-Capillary: 73 mg/dL (ref 70–99)
Glucose-Capillary: 88 mg/dL (ref 70–99)

## 2019-06-11 LAB — BASIC METABOLIC PANEL
Anion gap: 7 (ref 5–15)
BUN: 13 mg/dL (ref 6–20)
CO2: 24 mmol/L (ref 22–32)
Calcium: 9 mg/dL (ref 8.9–10.3)
Chloride: 109 mmol/L (ref 98–111)
Creatinine, Ser: 0.69 mg/dL (ref 0.44–1.00)
GFR calc Af Amer: >60 mL/min (ref >60)
GFR calc non Af Amer: >60 mL/min (ref >60)
Glucose, Bld: 105 mg/dL — ABNORMAL HIGH (ref 70–99)
Potassium: 3.6 mmol/L (ref 3.5–5.1)
Sodium: 140 mmol/L (ref 135–145)

## 2019-06-11 SURGERY — ESOPHAGOGASTRODUODENOSCOPY (EGD) WITH PROPOFOL
Anesthesia: Monitor Anesthesia Care | Laterality: Left

## 2019-06-11 MED ORDER — PROPOFOL 10 MG/ML IV BOLUS
INTRAVENOUS | Status: DC | PRN
  Administered 2019-06-11 (×2): 10 mg via INTRAVENOUS
  Administered 2019-06-11 (×4): 20 mg via INTRAVENOUS

## 2019-06-11 MED ORDER — KETOROLAC TROMETHAMINE 30 MG/ML IJ SOLN
30.0000 mg | Freq: Once | INTRAMUSCULAR | Status: AC
  Administered 2019-06-11: 30 mg via INTRAVENOUS
  Filled 2019-06-11: qty 1

## 2019-06-11 MED ORDER — HYDRALAZINE HCL 20 MG/ML IJ SOLN
5.0000 mg | Freq: Once | INTRAMUSCULAR | Status: AC
  Administered 2019-06-11: 5 mg via INTRAVENOUS

## 2019-06-11 MED ORDER — PROPOFOL 500 MG/50ML IV EMUL
INTRAVENOUS | Status: DC | PRN
  Administered 2019-06-11: 100 ug/kg/min via INTRAVENOUS

## 2019-06-11 MED ORDER — HYDRALAZINE HCL 20 MG/ML IJ SOLN
INTRAMUSCULAR | Status: AC
  Filled 2019-06-11: qty 1

## 2019-06-11 MED ORDER — LACTATED RINGERS IV SOLN
INTRAVENOUS | Status: DC | PRN
  Administered 2019-06-11: 14:00:00 via INTRAVENOUS

## 2019-06-11 SURGICAL SUPPLY — 15 items

## 2019-06-11 NOTE — Anesthesia Postprocedure Evaluation (Signed)
Anesthesia Post Note  Patient: Jenafer Winterton  Procedure(s) Performed: ESOPHAGOGASTRODUODENOSCOPY (EGD) WITH PROPOFOL (Left ) BIOPSY     Patient location during evaluation: Endoscopy Anesthesia Type: MAC Level of consciousness: awake and alert, patient cooperative and oriented Pain management: pain level controlled Vital Signs Assessment: post-procedure vital signs reviewed and stable Respiratory status: spontaneous breathing, nonlabored ventilation and respiratory function stable Cardiovascular status: blood pressure returned to baseline and stable Postop Assessment: no apparent nausea or vomiting Anesthetic complications: no    Last Vitals:  Vitals:   06/11/19 1518 06/11/19 1531  BP: (!) 186/90 (!) 175/100  Pulse: 81 94  Resp: (!) 8 18  Temp:  36.9 C  SpO2: 100% 100%    Last Pain:  Vitals:   06/11/19 1531  TempSrc: Oral  PainSc:                  Nasteho Glantz,E. Jasiya Markie

## 2019-06-11 NOTE — Progress Notes (Signed)
PROGRESS NOTE    Dominique Evans  CVE:[id number] DOB: 1965-11-23 DOA: 06/09/2019 PCP: Haywood Pao, MD   Brief Narrative:  Dominique Evans  is a 53 y.o. female, femalewith medical history significant fortype 2 diabetes with neuropathy, asthma, hypertension, hyperlipidemia, GERD, and chronic back pain who presented to the ED with multiple complaints, initially her main concern was chest pain, nausea and vomiting, upon further questioning, her chest pain was seemed to be related to her nausea and vomiting which are progressively worsening over the last week, but significant for last 2 days, reports nonbilious vomiting, nonbloody. 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 -In ED troponins were negative, EKG nonacute, CTA chest negative for PE, placed within normal limit, white blood cell, hemoglobin, all within normal limit GI was called and ED physician spoke to Dr. Collene Mares from [facility] who recommended admission for EGD.  Patient was then admitted under hospitalist service.  She underwent EGD on 06/11/2019 where she had another entire esophageal dilatation and she had some irregular ulcerations throughout esophagus.  Biopsies were taken.  Assessment & Plan:   Active Problems:   Type 2 diabetes mellitus with foot ulcer (HCC)   OBSTRUCTIVE SLEEP APNEA   Hypertension associated with diabetes ([place])   GERD   H/O gastric bypass   Esophageal dysmotility   Hyperlipidemia   Intractable nausea and vomiting   Esophageal dysphagia   Intractable nausea and vomiting/esophageal stricture: Improved.  Status post EGD and another esophageal dilatation.  She also had several ulcerations.  Continue PPI.  Start on clears and then advance diet as tolerated.  Patient is adamant on discharge however we do not have enough hours left for the day today that we will be able to achieve full liquid diet.   Patient has some cognitive deficit, it is challenging for her to understand what we recommend.  Discussed with patient's son about keeping her overnight.  He is in agreement.  Cognitive impairment: Spoke to patient's son.  Based on what he told me, this seems to be her baseline.  This is going on for about a year.  She has seen a neurologist as an outpatient and has been referred to a specialist neurologist and she is waiting for the appointment.  Son confirmed that this is her baseline.  Type 2 diabetes mellitus: Last hemoglobin A1c in July 2020 was 5.9.  Continue SSI.  Chest pain: This was atypical and likely secondary to stenosis/stricture.  This is resolved.  Troponins negative.  EKG negative.  Asthma: Stable.  Continue current medications.  Essential hypertension: Controlled.  Continue current medications.  Hyperlipidemia: Continue statin.  GERD: Continue PPI.  DVT prophylaxis: SCD Code Status: Full code Family Communication: Spoke to her son Linton Rump over the phone.  Discussed in length about patient.  He verbalized understanding.  He is in agreement for the patient to stay overnight. Disposition Plan: TBD.  Pending GI evaluation  Consultants:   GI  Procedures:   EGD with esophageal dilatation on 06/11/2019  Antimicrobials:   None   Subjective: Patient seen and examined.  Has no complaints.  Nursing staff reported that patient seems to have some cognitive impairment to the point that she sometimes cannot understand simple instructions.  Concerns raised by GI as well.  Objective: Vitals:   06/11/19 1455 06/11/19 1505 06/11/19 1510 06/11/19 1515  BP: (!) 199/96 (!) 196/96  (!) 188/82  Pulse: 73  76 77 86  Resp: 11 11 14 13   Temp:      TempSrc:      SpO2: 100% 100%  100%  Weight:      Height:        Intake/Output Summary (Last 24 hours) at 06/11/2019 1532 Last data filed at 06/11/2019 1426 Gross per 24 hour  Intake 200 ml  Output --  Net 200 ml   Filed Weights    06/11/19 1408  Weight: 83 kg    Examination:  General exam: Appears calm and comfortable  Respiratory system: Clear to auscultation. Respiratory effort normal. Cardiovascular system: S1 & S2 heard, RRR. No JVD, murmurs, rubs, gallops or clicks. No pedal edema. Gastrointestinal system: Abdomen is nondistended, soft and nontender. No organomegaly or masses felt. Normal bowel sounds heard. Central nervous system: Alert and oriented. No focal neurological deficits. Extremities: Symmetric 5 x 5 power. Skin: No rashes, lesions or ulcers Psychiatry: Judgement and insight appear poor, mood & affect appropriate.    Data Reviewed: I have personally reviewed following labs and imaging studies  CBC: Recent Labs  Lab 06/09/19 1045 06/10/19 0258  WBC 7.8 6.7  NEUTROABS 5.0  --   HGB 13.6 11.6*  HCT 42.8 36.0  MCV 87.7 86.7  PLT 288 243   Basic Metabolic Panel: Recent Labs  Lab 06/09/19 1146 06/10/19 0258 06/11/19 0328  NA 141 141 140  K 4.4 3.8 3.6  CL 107 107 109  CO2 23 23 24   GLUCOSE 94 81 105*  BUN 14 11 13   CREATININE 0.94 0.83 0.69  CALCIUM 9.3 9.1 9.0   GFR: Estimated Creatinine Clearance: 85.5 mL/min (by C-G formula based on SCr of 0.69 mg/dL). Liver Function Tests: Recent Labs  Lab 06/09/19 1146  AST 24  ALT 20  ALKPHOS 93  BILITOT 0.8  PROT 7.4  ALBUMIN 3.9   Recent Labs  Lab 06/09/19 1146  LIPASE 24   No results for input(s): AMMONIA in the last 168 hours. Coagulation Profile: No results for input(s): INR, PROTIME in the last 168 hours. Cardiac Enzymes: No results for input(s): CKTOTAL, CKMB, CKMBINDEX, TROPONINI in the last 168 hours. BNP (last 3 results) No results for input(s): PROBNP in the last 8760 hours. HbA1C: No results for input(s): HGBA1C in the last 72 hours. CBG: Recent Labs  Lab 06/10/19 2034 06/10/19 2330 06/11/19 0424 06/11/19 0815 06/11/19 1136  GLUCAP 88 83 78 73 88   Lipid Profile: No results for input(s): CHOL, HDL,  LDLCALC, TRIG, CHOLHDL, LDLDIRECT in the last 72 hours. Thyroid Function Tests: No results for input(s): TSH, T4TOTAL, FREET4, T3FREE, THYROIDAB in the last 72 hours. Anemia Panel: No results for input(s): VITAMINB12, FOLATE, FERRITIN, TIBC, IRON, RETICCTPCT in the last 72 hours. Sepsis Labs: No results for input(s): PROCALCITON, LATICACIDVEN in the last 168 hours.  Recent Results (from the past 240 hour(s))  SARS Coronavirus 2 [facility](Hospital order, Performed in [facility] lab) Nasopharyngeal Nasopharyngeal Swab     Status: None   Collection Time: 06/09/19  5:45 PM   Specimen: Nasopharyngeal Swab  Result Value Ref Range Status   SARS Coronavirus 2 NEGATIVE NEGATIVE Final    Comment: (NOTE) If result is NEGATIVE SARS-CoV-2 target nucleic acids are NOT DETECTED. The SARS-CoV-2 RNA is generally detectable in upper and lower  respiratory specimens during the acute phase of infection. The lowest  concentration of SARS-CoV-2 viral copies this assay can detect is 250  copies / mL. A negative result does not preclude SARS-CoV-2  infection  and should not be used as the sole basis for treatment or other  patient management decisions.  A negative result may occur with  improper specimen collection / handling, submission of specimen other  than nasopharyngeal swab, presence of viral mutation(s) within the  areas targeted by this assay, and inadequate number of viral copies  (<250 copies / mL). A negative result must be combined with clinical  observations, patient history, and epidemiological information. If result is POSITIVE SARS-CoV-2 target nucleic acids are DETECTED. The SARS-CoV-2 RNA is generally detectable in upper and lower  respiratory specimens dur ing the acute phase of infection.  Positive  results are indicative of active infection with SARS-CoV-2.  Clinical  correlation with patient history and other diagnostic information is  necessary to determine patient infection  status.  Positive results do  not rule out bacterial infection or co-infection with other viruses. If result is PRESUMPTIVE POSTIVE SARS-CoV-2 nucleic acids MAY BE PRESENT.   A presumptive positive result was obtained on the submitted specimen  and confirmed on repeat testing.  While 2019 novel coronavirus  (SARS-CoV-2) nucleic acids may be present in the submitted sample  additional confirmatory testing may be necessary for epidemiological  and / or clinical management purposes  to differentiate between  SARS-CoV-2 and other Sarbecovirus currently known to infect humans.  If clinically indicated additional testing with an alternate test  methodology [phone]([id number]) is advised. The SARS-CoV-2 RNA is generally  detectable in upper and lower respiratory sp ecimens during the acute  phase of infection. The expected result is Negative. Fact Sheet for Patients:  BoilerBrush.com.cyhttps://www.fda.gov/media/136312/download Fact Sheet for Healthcare Providers: https://pope.com/https://www.fda.gov/media/136313/download This test is not yet approved or cleared by the [place] FDA and has been authorized for detection and/or diagnosis of SARS-CoV-2 by FDA under an Emergency Use Authorization (EUA).  This EUA will remain in effect (meaning this test can be used) for the duration of the COVID-19 declaration under Section 564(b)(1) of the Act, 21 U.S.C. section 360bbb-3(b)(1), unless the authorization is terminated or revoked sooner. Performed at [facility], [address] [place], [place] [address]       Radiology Studies: Dg Esophagus W Single Cm (sol Or Thin Ba)  Result Date: 06/09/2019 CLINICAL DATA:  Painful swallowing.  Status post gastric bypass. EXAM: ESOPHOGRAM/BARIUM SWALLOW TECHNIQUE: Single contrast examination was performed using water-soluble contrast material. FLUOROSCOPY TIME:  Fluoroscopy Time:  3 minutes and 12 seconds. Radiation Exposure Index (if provided by the fluoroscopic device): 103 mGy Number of  Acquired Spot Images: COMPARISON:  CTA chest from earlier today. FINDINGS: Limited study in that the patient experienced marked pain with swallowing which limited ability to obtain esophageal distention. As seen on CT earlier today, the patient has a small hiatal hernia which probably contains a portion of the gastroenteric anastomosis. No evidence for a gross flow limiting esophageal stricture although there is some narrowing at the esophagogastric junction. Flow of contrast through the gastrojejunostomy was sluggish and there is some apparent narrowing/stricturing at the anastomosis. Flow of contrast in the Roux limb is very sluggish and no substantial peristalsis was visualized. After extended observation, minimal transit of contrast in the Roux limb was observed. IMPRESSION: 1. Patient experienced rather marked pain with swallowing. 2. Small hiatal hernia containing the gastric remnant and probably a component of the gastroenteric anastomosis. Contrast passage through the anastomosis is sluggish and the anastomosis appears narrowed. Little peristalsis in the Roux limb was observed although it is nondilated. Electronically Signed  By: Kennith CenterEric  Mansell M.D.   On: 06/09/2019 20:14    Scheduled Meds:  [MAR Hold] insulin aspart  0-9 Units Subcutaneous Q4H   [MAR Hold] losartan  50 mg Oral q morning - 10a   [MAR Hold] verapamil  240 mg Oral q morning - 10a   Continuous Infusions:  dextrose 5 % and 0.9% NaCl 125 mL/hr at 06/11/19 1009     LOS: 1 day   Time spent: 35 minutes   Hughie Clossavi Rosali Augello, MD [facility] Pager [phone]  If 7PM-7AM, please contact night-coverage [email or web address] Password [email or web address] 06/11/2019, 3:32 PM

## 2019-06-11 NOTE — Op Note (Signed)
[facility] Patient Name: Dominique Evans Procedure Date : 06/11/2019 MRN: [id number] Attending MD: Juanita Craver , MD Date of Birth: 07-Dec-1965 CSN: [id number] Age: 53 Admit Type: Inpatient Procedure:                EGD with esophageal biopsies. Indications:              Dysphagia, Nausea with vomiting, Non-cardiac chest                            pain. Providers:                Juanita Craver, MD, Elmer Ramp. Tilden Dome, RN, William Dalton, Technician, Clearnce Sorrel, CRNA Referring MD:             Mauri Pole, MD Medicines:                Monitored Anesthesia Care Complications:            No immediate complications. Estimated Blood Loss:     Estimated blood loss was minimal. Procedure:                Pre-Anesthesia Assessment: - Prior to the                            procedure, a history and physical was performed,                            and patient medications and allergies were                            reviewed. The patient's tolerance of previous                            anesthesia was also reviewed. The risks and                            benefits of the procedure and the sedation options                            and risks were discussed with the patient. All                            questions were answered, and informed consent was                            obtained. Prior Anticoagulants: The patient has                            taken no previous anticoagulant or antiplatelet                            agents. ASA Grade Assessment: II - A patient with  mild systemic disease. After reviewing the risks                            and benefits, the patient was deemed in                            satisfactory condition to undergo the procedure.                            After obtaining informed consent, the endoscope was                            passed under direct vision. Throughout the                procedure, the patient's blood pressure, pulse, and                            oxygen saturations were monitored continuously. The                            [id number]-H190 ([id number]) Olympus gastroscope was                            introduced through the mouth, and advanced to the                            jejunum. The EGD was accomplished without                            difficulty. The patient tolerated the procedure                            well. Scope In: Scope Out: Findings:      The lumen of the esophagus was moderately dilated; there were multiple       large ulcers in the esophagus that were biopsied for pathology; the       esophageal lumen was otherwise widely patent; the Z-line appeared       healthy.      There was a small gastric pouch noted with erythematous mucosa.      Evidence of a Roux-en-Y gastrojejunostomy was found. The gastrojejunal       anastomosis appeared healthy with a suture visible at the anastamosis;       this was traversed without difficulty; the pouch-to-jejunum limb was       characterized by healthy appearing mucosa upto 90 cm. 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                           -  Small gastric pouch with eruthematous mucosa;                            retroflexion noted done.                           - Roux-en-Y gastrojejunostomy with healthy                            appearing gastrojejunal anastomosis. Moderate Sedation:      MAC used. Recommendation:           - Clear liquid diet today.                           - Continue present medications.                           - Await pathology results. Procedure Code(s):        --- Professional ---                           [phone], Esophagogastroduodenoscopy, flexible,                             transoral; with biopsy, single or multiple Diagnosis Code(s):        --- Professional ---                           R13.10, Dysphagia, unspecified                           R11.2, Nausea with vomiting, unspecified                           R07.89, Other chest pain CPT copyright 2019 American Medical Association. All rights reserved. The codes documented in this report are preliminary and upon coder review may  be revised to meet current compliance requirements. Juanita Craver, MD Juanita Craver, MD 06/11/2019 2:43:31 PM This report has been signed electronically. Number of Addenda: 0

## 2019-06-11 NOTE — Progress Notes (Signed)
Pt discharged home.

## 2019-06-11 NOTE — Transfer of Care (Signed)
Immediate Anesthesia Transfer of Care Note  Patient: Dominique Evans  Procedure(s) Performed: ESOPHAGOGASTRODUODENOSCOPY (EGD) WITH PROPOFOL (Left ) BIOPSY  Patient Location: Endoscopy Unit  Anesthesia Type:MAC  Level of Consciousness: awake, alert  and oriented  Airway & Oxygen Therapy: Patient Spontanous Breathing  Post-op Assessment: Report given to RN and Post -op Vital signs reviewed and stable  Post vital signs: Reviewed and stable  Last Vitals:  Vitals Value Taken Time  BP 154/127 06/11/19 1433  Temp    Pulse 80 06/11/19 1434  Resp 19 06/11/19 1434  SpO2 99 % 06/11/19 1434  Vitals shown include unvalidated device data.  Last Pain:  Vitals:   06/11/19 1356  TempSrc:   PainSc: 0-No pain      Patients Stated Pain Goal: 0 ([id number])  Complications: No apparent anesthesia complications

## 2019-06-11 NOTE — Discharge Summary (Signed)
Physician Discharge Summary  Dominique Evans NFA:[id number] DOB: 11/16/1965 DOA: 06/09/2019  PCP: Gaspar Garbeisovec, Richard W, MD  Admit date: 06/09/2019 Discharge date: 06/11/2019  Admitted From: Home Disposition: Home  Recommendations for Outpatient Follow-up:  1. Follow up with PCP in 1-2 weeks 2. Follow with GI per their recommendations 3. Please obtain BMP/CBC in one week 4. Please follow up on the following pending results:  Home Health: None Equipment/Devices: None  Discharge Condition: Stable CODE STATUS: Full Diet recommendation: Liquid diet  Subjective: Patient seen and examined prior to EGD and then after EGD.  Even prior to EGD, she told me that she wants to go home after EGD.  After EGD, GI recommended that she can go home after she tolerates full liquid diet.  She did tolerate a little bit of liquid diet.  Brief/Interim Summary: Dominique Evans is a 53 y.o. female, female with medical history significant for type 2 diabetes with neuropathy, asthma, hypertension, hyperlipidemia, GERD, and chronic back pain who presented to the ED with multiple complaints, initially her main concern was chest pain, nausea and vomiting, upon further questioning, her chest pain was seemed to be related to her nausea and vomiting which are progressively worsening over the last week, but significant for last 2 days, reports nonbilious vomiting, nonbloody. 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  -In ED troponins were negative, EKG nonacute, CTA chest negative for PE, placed within normal limit, white blood cell, hemoglobin, all within normal limit GI was called and ED physician spoke to Dr. Loreta AveMann from [facility] who recommended admission for EGD. Patient was then admitted under hospitalist service. She underwent EGD on 06/11/2019 where she had another entire esophageal dilatation and she had some  irregular ulcerations throughout esophagus. Biopsies were taken.  After EGD, patient was very adamant on leaving and insisted to discharge her.  We wanted her to at least try liquids so we can see she is not having any further nausea or vomiting.  She tried few sips of liquid and did well.  I recommended her to stay overnight so that we can advance her diet so that she does not have to come back to the ER in case she has nausea and vomiting.  She stated very adamant and actually threatened to leave AMA if I would not discharge her.  I called her son Samuel BoucheLucas once again today.  He was not able to convince her either.  We are discharging her.  She also verbalized understanding that she may be at risk of having nausea and vomiting and return to the ER.  She is taking the responsibility.   Discharge Diagnoses:  Active Problems:   Type 2 diabetes mellitus with foot ulcer (HCC)   OBSTRUCTIVE SLEEP APNEA   Hypertension associated with diabetes (HCC)   GERD   H/O gastric bypass   Esophageal dysmotility   Hyperlipidemia   Intractable nausea and vomiting   Esophageal dysphagia    Discharge Instructions  Discharge Instructions    Discharge patient   Complete by: As directed    Discharge disposition: 01-Home or Self Care   Discharge patient date: 06/11/2019     Allergies as of 06/11/2019      Reactions   Penicillins Anaphylaxis   Has patient had a PCN reaction causing immediate rash, facial/tongue/throat swelling, SOB or lightheadedness with hypotension: yes Has patient had a PCN reaction causing severe rash involving mucus  membranes or skin necrosis: unknown Has patient had a PCN reaction that required hospitalization unknown Has patient had a PCN reaction occurring within the last 10 years: no If all of the above answers are "NO", then may proceed with Cephalosporin use.   Sulfonamide Derivatives Anaphylaxis   Clindamycin/lincomycin Nausea And Vomiting   Ciprofloxacin Hives      Medication  List    TAKE these medications   Advair Diskus 250-50 MCG/DOSE Aepb Generic drug: Fluticasone-Salmeterol Inhale 1 puff into the lungs 2 (two) times daily.   albuterol 108 (90 Base) MCG/ACT inhaler Commonly known as: VENTOLIN HFA Inhale 1-2 puffs into the lungs every 4 (four) hours as needed for wheezing or shortness of breath.   amitriptyline 25 MG tablet Commonly known as: ELAVIL Take 50 mg by mouth at bedtime.   Bayer Contour Test test strip Generic drug: glucose blood 1 each by Other route 2 (two) times a day. DX E11.29   Calcium 500+D3 500-400 MG-UNIT Tabs Generic drug: Calcium Carb-Cholecalciferol Take 1 tablet by mouth daily.   Cosentyx (300 MG Dose) 150 MG/ML Sosy Generic drug: Secukinumab (300 MG Dose) Inject 300 mg into the skin every 30 (thirty) days.   Dexilant 60 MG capsule Generic drug: dexlansoprazole Take 60 mg by mouth 2 (two) times daily.   ferrous sulfate 325 (65 FE) MG tablet Take 325 mg by mouth daily with breakfast.   fexofenadine 180 MG tablet Commonly known as: ALLEGRA Take 180 mg by mouth daily.   furosemide 20 MG tablet Commonly known as: LASIX Take 20 mg by mouth every morning.   lansoprazole 30 MG capsule Commonly known as: PREVACID Take 1 capsule (30 mg total) by mouth 2 (two) times daily before a meal. Wait at least 30 minutes before eating   losartan 50 MG tablet Commonly known as: COZAAR Take 50 mg by mouth every morning.   MiraLax 17 GM/SCOOP powder Generic drug: polyethylene glycol powder Take 17 g by mouth daily.   montelukast 10 MG tablet Commonly known as: SINGULAIR Take 10 mg by mouth at bedtime.   MULTIVITAMIN PO Take 1 tablet by mouth daily.   oxyCODONE-acetaminophen 5-325 MG tablet Commonly known as: PERCOCET/ROXICET Take 1 tablet by mouth See admin instructions. Take 1 tablet by mouth four to five times a day   potassium citrate 10 MEQ (1080 MG) SR tablet Commonly known as: UROCIT-K Take 10 mEq by mouth 3  (three) times daily with meals.   pregabalin 150 MG capsule Commonly known as: LYRICA Take 150 mg by mouth 2 (two) times daily.   Prevagen Extra Strength 20 MG Caps Generic drug: Apoaequorin Take 20 mg by mouth daily with breakfast.   Pristiq 50 MG 24 hr tablet Generic drug: desvenlafaxine Take 50 mg by mouth daily.   Pristiq 50 MG 24 hr tablet Generic drug: desvenlafaxine Take 50 mg by mouth daily.   rosuvastatin 10 MG tablet Commonly known as: CRESTOR Take 5 mg by mouth every morning.   verapamil 240 MG (CO) 24 hr tablet Commonly known as: COVERA HS Take 240 mg by mouth every morning.      Follow-up Information    [facility] EMERGENCY DEPARTMENT.   Specialty: Emergency Medicine Why: return to the ER for new or worsening symptoms Contact information: [id number] [place] [address] [phone]       [name], [name], MD.   Specialty: Internal Medicine Why: Follow up with PCP in 2-5 days Contact information: [address]  [address] [place] [address] [phone]        [name], Adelfa Kohichard W, MD Follow up in 1 week(s).   Specialty: Internal Medicine Contact information: [address] [place] [address] [phone]          Allergies  Allergen Reactions  . Penicillins Anaphylaxis    Has patient had a PCN reaction causing immediate rash, facial/tongue/throat swelling, SOB or lightheadedness with hypotension: yes Has patient had a PCN reaction causing severe rash involving mucus membranes or skin necrosis: unknown Has patient had a PCN reaction that required hospitalization unknown Has patient had a PCN reaction occurring within the last 10 years: no If all of the above answers are "NO", then may proceed with Cephalosporin use.   . Sulfonamide Derivatives Anaphylaxis  . Clindamycin/Lincomycin Nausea And Vomiting  . Ciprofloxacin Hives    Consultations: GI   Procedures/Studies: Ct  Angio Chest Pe W And/or Wo Contrast  Result Date: 06/09/2019 CLINICAL DATA:  PE suspected, intermediate probability, positive D-dimer EXAM: CT ANGIOGRAPHY CHEST WITH CONTRAST TECHNIQUE: Multidetector CT imaging of the chest was performed using the standard protocol during bolus administration of intravenous contrast. Multiplanar CT image reconstructions and MIPs were obtained to evaluate the vascular anatomy. CONTRAST:  75mL OMNIPAQUE IOHEXOL 350 MG/ML SOLN COMPARISON:  CT PA Feb 22, 2018 FINDINGS: Cardiovascular: Satisfactory opacification of the pulmonary arteries to the segmental level. No evidence of pulmonary embolism. Normal heart size. No pericardial effusion. Right-sided aortic arch, normal caliber. 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 Trachea is unremarkable. Thyroid and thoracic inlet are free of significant abnormality. Lungs/Pleura: No consolidation, features of edema, pneumothorax, or effusion. Calcified granuloma in the right middle lobe. Upper Abdomen: No acute abnormalities present in the visualized portions of the upper abdomen. Gastrojejunostomy anastomosis related to Roux-en-Y, as above. Musculoskeletal: Stable angulation at the thoracolumbar junction. No acute osseous abnormality or suspicious osseous lesion. Review of the MIP images confirms the above findings. IMPRESSION: No evidence of pulmonary embolism to the segmental level. 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 Right-sided aortic arch. Electronically Signed   By: Kreg ShropshirePrice  DeHay M.D.   On: 06/09/2019 15:17   Dg Chest Portable 1 View  Result Date: 06/09/2019 CLINICAL DATA:  Chest pain. EXAM: PORTABLE  CHEST 1 VIEW COMPARISON:  Radiograph April 27, 2019. FINDINGS: The heart size and mediastinal contours are within normal limits. Both lungs are clear. No pneumothorax or pleural effusion is noted. The visualized skeletal structures are unremarkable. IMPRESSION: No active disease. Electronically Signed   By: Lupita RaiderJames  Green Jr M.D.   On: 06/09/2019 11:04   Dg Esophagus W Single Cm (sol Or Thin Ba)  Result Date: 06/09/2019 CLINICAL DATA:  Painful swallowing.  Status post gastric bypass. EXAM: ESOPHOGRAM/BARIUM SWALLOW TECHNIQUE: Single contrast examination was performed using water-soluble contrast material. FLUOROSCOPY TIME:  Fluoroscopy Time:  3 minutes and 12 seconds. Radiation Exposure Index (if provided by the fluoroscopic device): 103 mGy Number of Acquired Spot Images: COMPARISON:  CTA chest from earlier today. FINDINGS: Limited study in that the patient experienced marked pain with swallowing which limited ability to obtain esophageal distention. As seen on CT earlier today, the patient has a small hiatal hernia which probably contains a portion of the gastroenteric anastomosis. No evidence for a  gross flow limiting esophageal stricture although there is some narrowing at the esophagogastric junction. Flow of contrast through the gastrojejunostomy was sluggish and there is some apparent narrowing/stricturing at the anastomosis. Flow of contrast in the Roux limb is very sluggish and no substantial peristalsis was visualized. After extended observation, minimal transit of contrast in the Roux limb was observed. IMPRESSION: 1. Patient experienced rather marked pain with swallowing. 2. Small hiatal hernia containing the gastric remnant and probably a component of the gastroenteric anastomosis. Contrast passage through the anastomosis is sluggish and the anastomosis appears narrowed. Little peristalsis in the Roux limb was observed although it is nondilated. Electronically Signed   By: Kennith Center M.D.   On:  06/09/2019 20:14      Discharge Exam: Vitals:   06/11/19 1518 06/11/19 1531  BP: (!) 186/90 (!) 175/100  Pulse: 81 94  Resp: (!) 8 18  Temp:  98.4 F (36.9 C)  SpO2: 100% 100%   Vitals:   06/11/19 1510 06/11/19 1515 06/11/19 1518 06/11/19 1531  BP:  (!) 188/82 (!) 186/90 (!) 175/100  Pulse: 77 86 81 94  Resp: 14 13 (!) 8 18  Temp:    98.4 F (36.9 C)  TempSrc:    Oral  SpO2:  100% 100% 100%  Weight:      Height:        General: Pt is alert, awake, not in acute distress Cardiovascular: RRR, S1/S2 +, no rubs, no gallops Respiratory: CTA bilaterally, no wheezing, no rhonchi Abdominal: Soft, NT, ND, bowel sounds + Extremities: no edema, no cyanosis    The results of significant diagnostics from this hospitalization (including imaging, microbiology, ancillary and laboratory) are listed below for reference.     Microbiology: Recent Results (from the past 240 hour(s))  SARS Coronavirus 2 [facility] order, Performed in [facility] hospital lab) Nasopharyngeal Nasopharyngeal Swab     Status: None   Collection Time: 06/09/19  5:45 PM   Specimen: Nasopharyngeal Swab  Result Value Ref Range Status   SARS Coronavirus 2 NEGATIVE NEGATIVE Final    Comment: (NOTE) If result is NEGATIVE SARS-CoV-2 target nucleic acids are NOT DETECTED. The SARS-CoV-2 RNA is generally detectable in upper and lower  respiratory specimens during the acute phase of infection. The lowest  concentration of SARS-CoV-2 viral copies this assay can detect is 250  copies / mL. A negative result does not preclude SARS-CoV-2 infection  and should not be used as the sole basis for treatment or other  patient management decisions.  A negative result may occur with  improper specimen collection / handling, submission of specimen other  than nasopharyngeal swab, presence of viral mutation(s) within the  areas targeted by this assay, and inadequate number of viral copies  (<250 copies / mL). A negative result  must be combined with clinical  observations, patient history, and epidemiological information. If result is POSITIVE SARS-CoV-2 target nucleic acids are DETECTED. The SARS-CoV-2 RNA is generally detectable in upper and lower  respiratory specimens dur ing the acute phase of infection.  Positive  results are indicative of active infection with SARS-CoV-2.  Clinical  correlation with patient history and other diagnostic information is  necessary to determine patient infection status.  Positive results do  not rule out bacterial infection or co-infection with other viruses. If result is PRESUMPTIVE POSTIVE SARS-CoV-2 nucleic acids MAY BE PRESENT.   A presumptive positive result was obtained on the submitted specimen  and confirmed on repeat testing.  While 2019 novel  coronavirus  (SARS-CoV-2) nucleic acids may be present in the submitted sample  additional confirmatory testing may be necessary for epidemiological  and / or clinical management purposes  to differentiate between  SARS-CoV-2 and other Sarbecovirus currently known to infect humans.  If clinically indicated additional testing with an alternate test  methodology [phone]([id number]) is advised. The SARS-CoV-2 RNA is generally  detectable in upper and lower respiratory sp ecimens during the acute  phase of infection. The expected result is Negative. Fact Sheet for Patients:  BoilerBrush.com.cyhttps://www.fda.gov/media/136312/download Fact Sheet for Healthcare Providers: https://pope.com/https://www.fda.gov/media/136313/download This test is not yet approved or cleared by the [place] FDA and has been authorized for detection and/or diagnosis of SARS-CoV-2 by FDA under an Emergency Use Authorization (EUA).  This EUA will remain in effect (meaning this test can be used) for the duration of the COVID-19 declaration under Section 564(b)(1) of the Act, 21 U.S.C. section 360bbb-3(b)(1), unless the authorization is terminated or revoked sooner. Performed at [facility], [address] [place], [place] [address]      Labs: BNP (last 3 results) No results for input(s): BNP in the last 8760 hours. Basic Metabolic Panel: Recent Labs  Lab 06/09/19 1146 06/10/19 0258 06/11/19 0328  NA 141 141 140  K 4.4 3.8 3.6  CL 107 107 109  CO2 23 23 24   GLUCOSE 94 81 105*  BUN 14 11 13   CREATININE 0.94 0.83 0.69  CALCIUM 9.3 9.1 9.0   Liver Function Tests: Recent Labs  Lab 06/09/19 1146  AST 24  ALT 20  ALKPHOS 93  BILITOT 0.8  PROT 7.4  ALBUMIN 3.9   Recent Labs  Lab 06/09/19 1146  LIPASE 24   No results for input(s): AMMONIA in the last 168 hours. CBC: Recent Labs  Lab 06/09/19 1045 06/10/19 0258  WBC 7.8 6.7  NEUTROABS 5.0  --   HGB 13.6 11.6*  HCT 42.8 36.0  MCV 87.7 86.7  PLT 288 243   Cardiac Enzymes: No results for input(s): CKTOTAL, CKMB, CKMBINDEX, TROPONINI in the last 168 hours. BNP: Invalid input(s): POCBNP CBG: Recent Labs  Lab 06/10/19 2034 06/10/19 2330 06/11/19 0424 06/11/19 0815 06/11/19 1136  GLUCAP 88 83 78 73 88   D-Dimer Recent Labs    06/09/19 1205  DDIMER 0.56*   Hgb A1c No results for input(s): HGBA1C in the last 72 hours. Lipid Profile No results for input(s): CHOL, HDL, LDLCALC, TRIG, CHOLHDL, LDLDIRECT in the last 72 hours. Thyroid function studies No results for input(s): TSH, T4TOTAL, T3FREE, THYROIDAB in the last 72 hours.  Invalid input(s): FREET3 Anemia work up No results for input(s): VITAMINB12, FOLATE, FERRITIN, TIBC, IRON, RETICCTPCT in the last 72 hours. Urinalysis    Component Value Date/Time   COLORURINE STRAW (A) 06/09/2019 1735   APPEARANCEUR CLEAR 06/09/2019 1735   LABSPEC 1.026 06/09/2019 1735   PHURINE 7.0 06/09/2019 1735   GLUCOSEU NEGATIVE 06/09/2019 1735   HGBUR NEGATIVE 06/09/2019 1735   BILIRUBINUR NEGATIVE 06/09/2019 1735   KETONESUR 20 (A) 06/09/2019 1735   PROTEINUR NEGATIVE 06/09/2019 1735   NITRITE NEGATIVE 06/09/2019 1735   LEUKOCYTESUR  NEGATIVE 06/09/2019 1735   Sepsis Labs Invalid input(s): PROCALCITONIN,  WBC,  LACTICIDVEN Microbiology Recent Results (from the past 240 hour(s))  SARS Coronavirus 2 [facility](Hospital order, Performed in [facility] hospital lab) Nasopharyngeal Nasopharyngeal Swab     Status: None   Collection Time: 06/09/19  5:45 PM   Specimen: Nasopharyngeal Swab  Result Value Ref Range Status  SARS Coronavirus 2 NEGATIVE NEGATIVE Final    Comment: (NOTE) If result is NEGATIVE SARS-CoV-2 target nucleic acids are NOT DETECTED. The SARS-CoV-2 RNA is generally detectable in upper and lower  respiratory specimens during the acute phase of infection. The lowest  concentration of SARS-CoV-2 viral copies this assay can detect is 250  copies / mL. A negative result does not preclude SARS-CoV-2 infection  and should not be used as the sole basis for treatment or other  patient management decisions.  A negative result may occur with  improper specimen collection / handling, submission of specimen other  than nasopharyngeal swab, presence of viral mutation(s) within the  areas targeted by this assay, and inadequate number of viral copies  (<250 copies / mL). A negative result must be combined with clinical  observations, patient history, and epidemiological information. If result is POSITIVE SARS-CoV-2 target nucleic acids are DETECTED. The SARS-CoV-2 RNA is generally detectable in upper and lower  respiratory specimens dur ing the acute phase of infection.  Positive  results are indicative of active infection with SARS-CoV-2.  Clinical  correlation with patient history and other diagnostic information is  necessary to determine patient infection status.  Positive results do  not rule out bacterial infection or co-infection with other viruses. If result is PRESUMPTIVE POSTIVE SARS-CoV-2 nucleic acids MAY BE PRESENT.   A presumptive positive result was obtained on the submitted specimen  and confirmed on repeat  testing.  While 2019 novel coronavirus  (SARS-CoV-2) nucleic acids may be present in the submitted sample  additional confirmatory testing may be necessary for epidemiological  and / or clinical management purposes  to differentiate between  SARS-CoV-2 and other Sarbecovirus currently known to infect humans.  If clinically indicated additional testing with an alternate test  methodology [phone]) is advised. The SARS-CoV-2 RNA is generally  detectable in upper and lower respiratory sp ecimens during the acute  phase of infection. The expected result is Negative. Fact Sheet for Patients:  StrictlyIdeas.no Fact Sheet for Healthcare Providers: BankingDealers.co.za This test is not yet approved or cleared by the [place] FDA and has been authorized for detection and/or diagnosis of SARS-CoV-2 by FDA under an Emergency Use Authorization (EUA).  This EUA will remain in effect (meaning this test can be used) for the duration of the COVID-19 declaration under Section 564(b)(1) of the Act, 21 U.S.C. section 360bbb-3(b)(1), unless the authorization is terminated or revoked sooner. Performed at [facility], [place] [address] [place], [place] [address]      Time coordinating discharge: Over 30 minutes  SIGNED:   Darliss Cheney, MD  [facility] 06/11/2019, 4:07 PM Pager [phone]  If 7PM-7AM, please contact night-coverage [email or web address] Password [email or web address]

## 2019-06-11 NOTE — Progress Notes (Signed)
Pt insists on not drinking clear liquids and says that she cannot tolerate clears. Wants to be discharged and threatens AMA discharge

## 2019-06-11 NOTE — Anesthesia Preprocedure Evaluation (Addendum)
Anesthesia Evaluation  Patient identified by MRN, date of birth, ID band Patient awake    Reviewed: Allergy & Precautions, NPO status , Patient's Chart, lab work & pertinent test results  History of Anesthesia Complications (+) PONV  Airway Mallampati: II  TM Distance: >3 FB Neck ROM: Full    Dental  (+) Dental Advisory Given   Pulmonary COPD,  COPD inhaler, former smoker (quit 1984),  06/09/2019 SARS coronavirus NEG   breath sounds clear to auscultation       Cardiovascular hypertension, Pt. on medications (-) angina Rhythm:Regular Rate:Normal  S/p PDA repair R aortic arch '16 stress: EF is normal (55-65%). Nuclear stress EF: 62%, no ST segment deviation noted during stress. The study is normal.   Neuro/Psych PSYCHIATRIC DISORDERS (memory issues) Anxiety Depression    GI/Hepatic Neg liver ROS, hiatal hernia, GERD  Medicated and Controlled,S/p gastric bypass   Endo/Other  diabetes (no longer needs medication)  Renal/GU stones     Musculoskeletal   Abdominal   Peds  Hematology negative hematology ROS (+)   Anesthesia Other Findings   Reproductive/Obstetrics                            Anesthesia Physical Anesthesia Plan  ASA: III  Anesthesia Plan: MAC   Post-op Pain Management:    Induction:   PONV Risk Score and Plan: 3 and Ondansetron and Treatment may vary due to age or medical condition  Airway Management Planned: Natural Airway and Nasal Cannula  Additional Equipment:   Intra-op Plan:   Post-operative Plan:   Informed Consent: I have reviewed the patients History and Physical, chart, labs and discussed the procedure including the risks, benefits and alternatives for the proposed anesthesia with the patient or authorized representative who has indicated his/her understanding and acceptance.     Dental advisory given  Plan Discussed with: CRNA and Surgeon  Anesthesia  Plan Comments:        Anesthesia Quick Evaluation

## 2019-06-11 NOTE — Progress Notes (Signed)
Pt bp running 200/90. Order for hydralazine iv per Dr. Glennon Mac  Pt states she wants to take her pain medication for her back that she takes at home.

## 2019-06-12 ENCOUNTER — Encounter (HOSPITAL_COMMUNITY): Payer: Self-pay | Admitting: Gastroenterology

## 2019-06-19 ENCOUNTER — Encounter: Payer: Self-pay | Admitting: Podiatry

## 2019-06-19 ENCOUNTER — Other Ambulatory Visit: Payer: Self-pay

## 2019-06-19 ENCOUNTER — Ambulatory Visit (INDEPENDENT_AMBULATORY_CARE_PROVIDER_SITE_OTHER): Payer: 59 | Admitting: Podiatry

## 2019-06-19 DIAGNOSIS — L03032 Cellulitis of left toe: Secondary | ICD-10-CM

## 2019-06-19 DIAGNOSIS — L989 Disorder of the skin and subcutaneous tissue, unspecified: Secondary | ICD-10-CM

## 2019-06-19 DIAGNOSIS — E0843 Diabetes mellitus due to underlying condition with diabetic autonomic (poly)neuropathy: Secondary | ICD-10-CM

## 2019-06-21 NOTE — Progress Notes (Signed)
HPI: 53 year old female presents to the office today for follow up evaluation of an ulceration of the left second toe, pre-ulcerative callus lesions of the bilateral feet and recurrent cellulitis. She states she is doing well. She reports the wound has healed and is not causing her any issues. She states the redness and swelling she was experiencing to the left second toe has resolved. She states her calluses are becoming painful again and likely need to be trimmed again. Walking and bearing weight increases the pain. She has been applying Mupirocin cream to the wound which has helped alleviate the symptoms. Patient is here for further evaluation and treatment.    Past Medical History:  Diagnosis Date  . Allergic rhinitis   . Anemia   . Arthritis    spondylolisthesis- lumbar region  . Asthma    PFT 08/12/07: FEV1 2.97 (104%), FEV1% 84, TLC 4.89 (90%), DLCO 84%, +BD  . Blood transfusion    hx of 2002   . Blood transfusion without reported diagnosis    kidney, no issues  . Chronic constipation   . 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 Cough 03/23/13  . Depression   . Diabetes mellitus    pre gastric bypass  . Esophageal dysmotility 03/01/2015  . GERD (gastroesophageal reflux disease)   . H/O hiatal hernia   . Head injury due to trauma    age 36, went under car while sledding, had memory loss at that time  . 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 History of stress test    exercise stress test, ? where it was done,  about 6 yrs. ago,  results- negative   . Hydatidiform mole   . Hyperlipidemia   . Hypertension   . 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 Obesity   . Panic attacks   . Peripheral neuropathy    bilateral feet  . PONV (postoperative nausea and vomiting)   . Psoriasis   . Seasonal allergies   . Status post dilation of esophageal narrowing   . Unspecified essential hypertension   . Visual disturbance       Physical Exam: General: The patient is alert and oriented x3 in no acute distress.  Dermatology: Wound noted to the left great toe has healed. Complete re-epithelialization has occurred. No drainage noted.  Hyperkeratotic pre-ulcerative callus lesions noted to the bilateral feet x 3.     Skin is warm, dry and supple bilateral lower extremities. Negative for open lesions or macerations.   Vascular: Palpable pedal pulses bilaterally. No edema or erythema noted. Capillary refill within normal limits.  Neurological: Epicritic and protective threshold diminished bilaterally.   Musculoskeletal Exam: Range of motion within normal limits to all pedal and ankle joints bilateral. Muscle strength 5/5 in all groups bilateral.   Assessment: 1. Ulcer left great toe secondary to diabetes mellitus - healed  2. Pre-ulcerative callus lesions bilateral x3 3. Cellulitis left second toe - healed  Plan of Care:  1. Patient evaluated. 2. Excisional debridement of keratotic lesions using a chisel blade was performed without incident. Light dressing applied.  3. Recommended good shoe gear.  4. Return to clinic in 3 months for routine care.      Edrick Kins, DPM [facility]  Dr. Edrick Kins, DPM    [address]  [place], [place] [address]                Office [phone]  Fax [phone]

## 2019-06-22 ENCOUNTER — Encounter: Payer: Self-pay | Admitting: Nurse Practitioner

## 2019-06-22 ENCOUNTER — Ambulatory Visit (INDEPENDENT_AMBULATORY_CARE_PROVIDER_SITE_OTHER): Payer: 59 | Admitting: Nurse Practitioner

## 2019-06-22 ENCOUNTER — Telehealth: Payer: Self-pay | Admitting: *Deleted

## 2019-06-22 VITALS — BP 92/58 | HR 85 | Temp 98.4°F | Ht 66.0 in | Wt 190.0 lb

## 2019-06-22 DIAGNOSIS — K224 Dyskinesia of esophagus: Secondary | ICD-10-CM

## 2019-06-22 DIAGNOSIS — K2210 Ulcer of esophagus without bleeding: Secondary | ICD-10-CM | POA: Diagnosis not present

## 2019-06-22 NOTE — Progress Notes (Signed)
Chief Complaint: hospital follow up     IMPRESSION and PLAN:    1. 53 yo female with GERD / esophageal dysmotility and evidence of EGJ outflow obstruction.  Admitted late August with chest pain / dysphagia / nausea / vomiting.  Inpatient EGD by Dr. [name] revealed a moderately dilated esophagus with multiple large ulcers.  Path compatible with nonspecific, severe acute esophagitis.  No viral components.  -Patient is doing much better since hospital discharge 06/11/2019.  No further chest pain, she can eat and drink. Her GERD medications were not changed, she remains on Dexilant twice daily. -Antireflux measures reviewed -Continue twice daily Dexilant.  I did not add Carafate as she is already on multiple medications and is no longer having any chest discomfort -I will talk with Dr. Lavon Paganini to see if she would like a follow-up EGD in a few weeks to document healing of the severe ulcerations.    2. Colon cancer screening. Due for colonoscopy in 2021 , last colonoscopy in 2016 was limited by poor prep  HPI:     Patient is a 53 yo female with PMH significant for Roux-en Y, HTN, asthma, DM, depression, and obesity. She is known to Dr. 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 Her symptoms have historically consisted of chest pain and choking on both solids and liquids. She had been doing well since 2017 from a GI standpoint until just a few weeks ago when she developed recurrent chest pain.  This was associated with some shortness of breath.  Her symptoms persisted and then culminated in inability to eat or drink without regurgitation/vomiting.  In the ED her troponins were negative, no acute findings on EKG.  CT Angie of the chest negative for PE.  Patient was seen by Dr. Charna Elizabeth who is covering for our practice for the weekend.  She performed an EGD with findings of moderately dilated esophageal lumen with multiple  large ulcers.  There was erythematous mucosa of the small gastric pouch.  The anastomosis were healthy appearing.  Esophageal biopsies compatible with nonspecific severe esophagitis, no viral component.   Prior to admission patient was taking Dexilant twice daily.  She was discharged home on 06/11/19 on the same.  She feels much better, can eat and drink.  No further chest pain.  Data Reviewed:  06/10/19 hgb 11.6, CBC o/w nl BMET nl  06/09/19 CT angio chest -Roux-en-Y with a moderate superimposed hiatal hernia and a thickened and patulous distal esophagus  EGD 06/11/19  Dilation in the entire esophagus s/p gastric Rou-en-Y-multiple irregular ulceraions noted throughout the esophagus-biopsies done. - Small gastric pouch with eruthematous mucosa; retroflexion noted done. - Roux-en-Y gastrojejunostomy with healthy appearing gastrojejunal anastomosis  Esoph path - severe acute non-specific esophagitis. 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 where it was done,  about 6 yrs. ago,  results- negative    Hydatidiform mole    Hyperlipidemia    Hypertension      Nephrolithiasis    left kidney removed due to  calcification    Obesity    Panic attacks    Peripheral neuropathy    bilateral feet   PONV (postoperative nausea and vomiting)    Psoriasis    Seasonal allergies    Status post dilation of esophageal narrowing    Unspecified essential hypertension    Visual disturbance     Patient's surgical history, family medical history, social history, medications and allergies were all reviewed in Epic   Serum creatinine: 0.69 mg/dL 78/29/5608/23/20 [id number] Estimated creatinine clearance: 85.5 mL/min  Current Outpatient Medications  Medication Sig Dispense Refill   albuterol (PROVENTIL HFA;VENTOLIN HFA) 108 (90 BASE) MCG/ACT inhaler Inhale 1-2 puffs into the lungs every 4 (four) hours as needed for wheezing or shortness of breath.     amitriptyline (ELAVIL) 25 MG tablet Take 50 mg by mouth at bedtime.      Apoaequorin (PREVAGEN EXTRA STRENGTH) 20 MG CAPS Take 20 mg by mouth daily with breakfast.     BAYER CONTOUR TEST test strip 1 each by Other route 2 (two) times a day. DX E11.29  5   Calcium Carb-Cholecalciferol (CALCIUM 500+D3) 500-400 MG-UNIT TABS Take 1 tablet by mouth daily.     desvenlafaxine (PRISTIQ) 50 MG 24 hr tablet Take 50 mg by mouth daily.     dexlansoprazole (DEXILANT) 60 MG capsule Take 60 mg by mouth 2 (two) times daily.     ferrous sulfate 325 (65 FE) MG tablet Take 325 mg by mouth daily with breakfast.     fexofenadine (ALLEGRA) 180 MG tablet Take 180 mg by mouth daily.     Fluticasone-Salmeterol (ADVAIR DISKUS) 250-50 MCG/DOSE AEPB Inhale 1 puff into the lungs 2 (two) times daily.      furosemide (LASIX) 20 MG tablet Take 20 mg by mouth every morning.      lansoprazole (PREVACID) 30 MG capsule Take 1 capsule (30 mg total) by mouth 2 (two) times daily before a meal. Wait at least 30 minutes before eating 180 capsule 3   losartan (COZAAR) 50 MG tablet Take 50 mg by mouth every morning.      montelukast  (SINGULAIR) 10 MG tablet Take 10 mg by mouth at bedtime.      Multiple Vitamins-Minerals (MULTIVITAMIN PO) Take 1 tablet by mouth daily.      oxyCODONE-acetaminophen (PERCOCET/ROXICET) 5-325 MG tablet Take 1 tablet by mouth See admin instructions. Take 1 tablet by mouth four to five times a day     polyethylene glycol (MIRALAX) powder Take 17 g by mouth daily.      potassium citrate (UROCIT-K) 10 MEQ (1080 MG) SR tablet Take 10 mEq by mouth 3 (three) times daily with meals.      pregabalin (LYRICA) 150 MG capsule Take 150 mg by mouth 2 (two) times daily.     PRISTIQ 50 MG 24  hr tablet Take 50 mg by mouth daily.  3   rosuvastatin (CRESTOR) 10 MG tablet Take 5 mg by mouth every morning.      Secukinumab, 300 MG Dose, (COSENTYX, 300 MG DOSE,) 150 MG/ML SOSY Inject 300 mg into the skin every 30 (thirty) days.     verapamil (COVERA HS) 240 MG (CO) 24 hr tablet Take 240 mg by mouth every morning.      No current facility-administered medications for this visit.     Physical Exam:     LMP 12/21/2015   GENERAL:  Pleasant female in NAD PSYCH: : Cooperative, normal affect EENT:  conjunctiva pink, mucous membranes moist, neck supple without masses CARDIAC:  RRR, no murmur heard, no peripheral edema PULM: Normal respiratory effort, lungs CTA bilaterally, no wheezing ABDOMEN:  Nondistended, soft, nontender. No obvious masses, no hepatomegaly,  normal bowel sounds SKIN:  turgor, no lesions seen Musculoskeletal:  Normal muscle tone, normal strength NEURO: Alert and oriented x 3, no focal neurologic deficits   Tye Savoy , NP 06/22/2019, 8:25 AM

## 2019-06-22 NOTE — Telephone Encounter (Signed)
Called patient to schedule an appointment for a New Pt appointment per Dr Silverio Decamp  Could not leave a message to schedule  Voice mail not set up      ===View-only below this line=== ----- Message ----- From: Mauri Pole, MD Sent: 06/14/2019   4:29 PM EDT To: Oda Kilts, CMA  I believe this is the correct patient.  Dr. Collene Mares misspelled her name.  Please bring her in for office follow-up visit next available appointment after recent hospitalization.

## 2019-06-22 NOTE — Patient Instructions (Signed)
If you are age 55 or older, your body mass index should be between 23-30. Your Body mass index is 30.67 kg/m. If this is out of the aforementioned range listed, please consider follow up with your Primary Care Provider.  If you are age 19 or younger, your body mass index should be between 19-25. Your Body mass index is 30.67 kg/m. If this is out of the aformentioned range listed, please consider follow up with your Primary Care Provider.   Continue Dexilant twice daily.  Will call if Dr. Silverio Decamp want a follow up EGD.  Thank you for choosing me and [facility].   Tye Savoy, NP

## 2019-07-11 NOTE — Telephone Encounter (Signed)
Patient seen Dominique Evans on 9/3

## 2019-07-27 ENCOUNTER — Encounter: Payer: Self-pay | Admitting: Psychology

## 2019-07-31 ENCOUNTER — Ambulatory Visit (INDEPENDENT_AMBULATORY_CARE_PROVIDER_SITE_OTHER): Payer: 59

## 2019-07-31 ENCOUNTER — Other Ambulatory Visit: Payer: Self-pay

## 2019-07-31 ENCOUNTER — Ambulatory Visit (INDEPENDENT_AMBULATORY_CARE_PROVIDER_SITE_OTHER): Payer: 59 | Admitting: Podiatry

## 2019-07-31 DIAGNOSIS — M79672 Pain in left foot: Secondary | ICD-10-CM

## 2019-07-31 DIAGNOSIS — L989 Disorder of the skin and subcutaneous tissue, unspecified: Secondary | ICD-10-CM

## 2019-07-31 DIAGNOSIS — S93609A Unspecified sprain of unspecified foot, initial encounter: Secondary | ICD-10-CM | POA: Diagnosis not present

## 2019-07-31 DIAGNOSIS — M79671 Pain in right foot: Secondary | ICD-10-CM

## 2019-07-31 DIAGNOSIS — S93601A Unspecified sprain of right foot, initial encounter: Secondary | ICD-10-CM | POA: Diagnosis not present

## 2019-08-01 ENCOUNTER — Other Ambulatory Visit: Payer: Self-pay | Admitting: Podiatry

## 2019-08-01 DIAGNOSIS — S93601A Unspecified sprain of right foot, initial encounter: Secondary | ICD-10-CM

## 2019-08-04 NOTE — Progress Notes (Signed)
Subjective: 53 y.o. female with PMHx of diabetes mellitus presenting today with a chief complaint of bilateral foot pain that began about two weeks ago secondary to a fall. She was seen at urgent care and had negative X-Rays. She has been trying to rest the feet for treatment. Patient is here for further evaluation and treatment.   Past Medical History:  Diagnosis Date  . Allergic rhinitis   . Anemia   . Arthritis    spondylolisthesis- lumbar region  . Asthma    PFT 08/12/07: FEV1 2.97 (104%), FEV1% 84, TLC 4.89 (90%), DLCO 84%, +BD  . Blood transfusion    hx of 2002   . Blood transfusion without reported diagnosis    kidney, no issues  . Chronic constipation   . 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 Cough 03/23/13  . Depression   . Diabetes mellitus    pre gastric bypass  . Esophageal dysmotility 03/01/2015  . GERD (gastroesophageal reflux disease)   . H/O hiatal hernia   . Head injury due to trauma    age 55, went under car while sledding, had memory loss at that time  . 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 History of stress test    exercise stress test, ? where it was done,  about 6 yrs. ago,  results- negative   . Hydatidiform mole   . Hyperlipidemia   . Hypertension   . 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 Obesity   . Panic attacks   . Peripheral neuropathy    bilateral feet  . PONV (postoperative nausea and vomiting)   . Psoriasis   . Seasonal allergies   . Status post dilation of esophageal narrowing   . Unspecified essential hypertension   . Visual disturbance      Objective:  Physical Exam General: Alert and oriented x3 in no acute distress  Dermatology: Hyperkeratotic lesion(s) present on the left great toe. Pain on palpation with a central nucleated core noted.  Skin is warm, dry and supple bilateral lower extremities. Negative for open lesions or macerations.  Vascular: Palpable  pedal pulses bilaterally. No edema or erythema noted. Capillary refill within normal limits.  Neurological: Epicritic and protective threshold diminished bilaterally.   Musculoskeletal Exam: Pain on palpation at the keratotic lesion(s) noted. Range of motion within normal limits bilateral. Muscle strength 5/5 in all groups bilateral.  Radiographic Exam:  Normal osseous mineralization. Joint spaces preserved. No fracture/dislocation/boney destruction.    Assessment: #1 Diabetes mellitus w/ polyperipheral neuropathy #2 Pre-ulcerative callus lesion noted to the left great toe #3 foot sprain bilateral - improving   Plan of Care:  #1 Patient evaluated #2 Excisional debridement of keratotic lesion(s) using a chisel blade was performed without incident.  #3 Dressed area with light dressing. #4 recommended good shoe gear.  #5 Patient is to return to the clinic PRN.    Edrick Kins, DPM [facility]  Dr. Edrick Kins, DPM    [address]                                        [place], [place] [address]                Office [phone]  Fax [phone]  [phone]     

## 2019-08-07 NOTE — Progress Notes (Signed)
Reviewed and agree with documentation and assessment and plan. K. Veena Thelma Viana , MD   

## 2019-08-27 ENCOUNTER — Encounter (HOSPITAL_COMMUNITY): Payer: Self-pay

## 2019-08-27 ENCOUNTER — Emergency Department (HOSPITAL_COMMUNITY)
Admission: EM | Admit: 2019-08-27 | Discharge: 2019-08-27 | Disposition: A | Discharge status: Home / Self Care | Payer: No Typology Code available for payment source | Attending: Emergency Medicine | Admitting: Emergency Medicine

## 2019-08-27 ENCOUNTER — Emergency Department (HOSPITAL_COMMUNITY): Payer: No Typology Code available for payment source

## 2019-08-27 ENCOUNTER — Other Ambulatory Visit: Payer: Self-pay

## 2019-08-27 DIAGNOSIS — W01198A Fall on same level from slipping, tripping and stumbling with subsequent striking against other object, initial encounter: Secondary | ICD-10-CM | POA: Diagnosis not present

## 2019-08-27 DIAGNOSIS — S0512XA Contusion of eyeball and orbital tissues, left eye, initial encounter: Secondary | ICD-10-CM | POA: Diagnosis not present

## 2019-08-27 DIAGNOSIS — I10 Essential (primary) hypertension: Secondary | ICD-10-CM | POA: Diagnosis not present

## 2019-08-27 DIAGNOSIS — Y929 Unspecified place or not applicable: Secondary | ICD-10-CM | POA: Diagnosis not present

## 2019-08-27 DIAGNOSIS — E119 Type 2 diabetes mellitus without complications: Secondary | ICD-10-CM | POA: Insufficient documentation

## 2019-08-27 DIAGNOSIS — Z87891 Personal history of nicotine dependence: Secondary | ICD-10-CM | POA: Diagnosis not present

## 2019-08-27 DIAGNOSIS — S060X1A Concussion with loss of consciousness of 30 minutes or less, initial encounter: Secondary | ICD-10-CM | POA: Diagnosis not present

## 2019-08-27 DIAGNOSIS — Z79899 Other long term (current) drug therapy: Secondary | ICD-10-CM | POA: Diagnosis not present

## 2019-08-27 DIAGNOSIS — Y939 Activity, unspecified: Secondary | ICD-10-CM | POA: Diagnosis not present

## 2019-08-27 DIAGNOSIS — S0990XA Unspecified injury of head, initial encounter: Secondary | ICD-10-CM | POA: Diagnosis present

## 2019-08-27 DIAGNOSIS — Y999 Unspecified external cause status: Secondary | ICD-10-CM | POA: Insufficient documentation

## 2019-08-27 DIAGNOSIS — J45909 Unspecified asthma, uncomplicated: Secondary | ICD-10-CM | POA: Insufficient documentation

## 2019-08-27 NOTE — Discharge Instructions (Signed)
Get help right away if: °You have severe or worsening headaches. °You have weakness or numbness in any part of your body. °You are confused. °Your coordination gets worse. °You vomit repeatedly. °You are sleepier than normal. °Your speech is slurred. °You cannot recognize people or places. °You have a seizure. °It is difficult to wake you up. °You have unusual behavior changes. °You have changes in your vision. °You lose consciousness. °

## 2019-08-27 NOTE — ED Notes (Signed)
Patient to return to ER if symptoms worsen. Patient and family verbalized understanding of education.

## 2019-08-27 NOTE — ED Triage Notes (Signed)
Patient states she fell when standing up from bed and hit her head on a trash can. Pt reports not remembering anything except her son helping her up and telling her what happen. A 1-inch hematoma is noted over the left eye below the eyebrow. Pt denies pain.

## 2019-08-27 NOTE — ED Provider Notes (Addendum)
[facility] EMERGENCY DEPARTMENT Provider Note   CSN: [id number] Arrival date & time: 08/27/19  2979     History   Chief Complaint No chief complaint on file.   HPI Dominique Evans is a 53 y.o. female who had a mechanical fall this morning. She hit her head on the a trash can. She is amnestic to the event. She was helped by her son who lives with her back to her bed and is amnestic to the event. She does remember falling and hitting her head. She remembers waking up. Her other son who is at bedside brought him in. She has no current neurologic complaints. She denies photophobia or vision changes.      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 where it was done,  about 6 yrs. 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    Obesity    Panic attacks    Peripheral neuropathy    bilateral feet   PONV (postoperative nausea and vomiting)    Psoriasis    Seasonal allergies    Status post dilation of esophageal narrowing    Unspecified essential hypertension    Visual disturbance     Patient Active Problem  List   Diagnosis Date Noted   Esophageal dysphagia 06/10/2019   Intractable nausea and vomiting 06/09/2019   Cellulitis of left foot 04/27/2019   Hyperlipidemia 02/16/2019   Chronic pain 09/24/2018   Incisional hernia 09/24/2018   Low back pain 09/24/2018   Major depressive disorder with single episode 09/24/2018   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   Esophageal dysmotility 03/01/2015   Colon cancer screening 03/01/2015   Lateral ventral hernia 03/01/2015   S/P lumbar spinal fusion 05/23/2014   Edema 06/28/2013   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   HNP (herniated nucleus pulposus), lumbar 11/30/2012   THORACIC AORTIC ANEURYSM 10/23/2010   CONSTIPATION, CHRONIC 03/23/2010   OBESITY, UNSPECIFIED 10/31/2007   OBSTRUCTIVE SLEEP APNEA 10/31/2007   CHRONIC RHINITIS 10/31/2007   Cough variant asthma 10/31/2007   GERD 10/31/2007   Type 2 diabetes mellitus with foot ulcer ([place]) 10/04/2007   Hypertension associated with diabetes ([place]) 10/04/2007   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   NEPHRECTOMY, HX OF 10/04/2007    Past Surgical History:  Procedure Laterality Date   BIOPSY  06/11/2019   Procedure: BIOPSY;  Surgeon: Juanita Craver, MD;  Location: [facility] ENDOSCOPY;  Service: Endoscopy;;  BOTOX INJECTION N/A 06/07/2015   Procedure: BOTOX INJECTION;  Surgeon: Louis Meckel, MD;  Location: [facility] ENDOSCOPY;  Service: Endoscopy;  Laterality: N/A;   COLONOSCOPY WITH PROPOFOL N/A 06/07/2015   Procedure: COLONOSCOPY WITH PROPOFOL;  Surgeon: Louis Meckel, MD;  Location: [facility] ENDOSCOPY;  Service: Endoscopy;  Laterality: N/A;   ELBOW FRACTURE SURGERY     left   ESOPHAGEAL MANOMETRY N/A 04/08/2015   Procedure: ESOPHAGEAL MANOMETRY (EM);  Surgeon: Louis Meckel, MD;  Location: [facility] ENDOSCOPY;  Service: Endoscopy;  Laterality: N/A;   ESOPHAGOGASTRODUODENOSCOPY (EGD) WITH PROPOFOL N/A 06/07/2015   Procedure: ESOPHAGOGASTRODUODENOSCOPY (EGD) WITH PROPOFOL;  Surgeon: Louis Meckel, MD;  Location: [facility] ENDOSCOPY;  Service: Endoscopy;  Laterality: N/A;   ESOPHAGOGASTRODUODENOSCOPY (EGD) WITH PROPOFOL Left 06/11/2019   Procedure: ESOPHAGOGASTRODUODENOSCOPY (EGD) WITH PROPOFOL;  Surgeon: Charna Elizabeth, MD;  Location: [facility] ENDOSCOPY;  Service: Endoscopy;  Laterality: Left;   GASTRIC ROUX-EN-Y  10/05/2011   Procedure: LAPAROSCOPIC ROUX-EN-Y GASTRIC;  Surgeon: Mariella Saa, MD;  Location: [facility] ORS;  Service: General;  Laterality: N/A;  UPPER ENDOSCOPY   lap band removal      LAPAROSCOPIC GASTRIC BANDING  02/2006   w/ truncal vagotomy   LUMBAR LAMINECTOMY/DECOMPRESSION MICRODISCECTOMY Left 11/30/2012   Procedure: MICRO LUMBAR DECOMPRESSION L4-5 ON THE LEFT;  Surgeon: Javier Docker, MD;  Location: [facility] ORS;  Service: Orthopedics;  Laterality: Left;  MICRO LUMBAR DECOMPRESSION L4-5 ON THE LEFT   LUMBAR LAMINECTOMY/DECOMPRESSION MICRODISCECTOMY N/A 03/30/2013   Procedure: REDO MICRO LUMBAR DECOMPRESSION L4-5;  Surgeon: Javier Docker, MD;  Location: [facility] ORS;  Service: Orthopedics;  Laterality: N/A;   MAXIMUM ACCESS (MAS)POSTERIOR LUMBAR INTERBODY FUSION (PLIF) 1 LEVEL N/A 05/23/2014   Procedure: FOR MAXIMUM ACCESS (MAS) POSTERIOR LUMBAR INTERBODY FUSION (PLIF) Lumbar Four-Five;  Surgeon: Tia Alert, MD;  Location: [facility] NEURO ORS;  Service: Neurosurgery;  Laterality: N/A;  FOR MAXIMUM ACCESS (MAS) POSTERIOR LUMBAR INTERBODY FUSION (PLIF) Lumbar Four-Five   NEPHRECTOMY  2002   left   PATENT DUCTUS ARTERIOUS REPAIR     SHOULDER ARTHROSCOPY     right shoulder     OB History   No obstetric history on file.      Home Medications    Prior to Admission medications   Medication Sig Start Date End Date Taking? Authorizing Provider  albuterol (PROVENTIL HFA;VENTOLIN HFA) 108 (90 BASE) MCG/ACT inhaler Inhale 1-2 puffs into the lungs every 4 (four) hours as needed for wheezing or shortness of breath.   Yes [provider]  amitriptyline (ELAVIL) 25 MG tablet Take  50 mg by mouth at bedtime.    Yes [provider]  Calcium Carb-Cholecalciferol (CALCIUM 500+D3) 500-400 MG-UNIT TABS Take 1 tablet by mouth daily.   Yes [provider]  dexlansoprazole (DEXILANT) 60 MG capsule Take 60 mg by mouth 2 (two) times daily.   Yes [provider]  ferrous sulfate 325 (65 FE) MG tablet Take 325 mg by mouth daily with breakfast.   Yes [provider]  fexofenadine (ALLEGRA) 180 MG tablet Take 180 mg by mouth daily.   Yes [provider]  Fluticasone-Salmeterol (ADVAIR DISKUS) 250-50 MCG/DOSE AEPB Inhale 1 puff into the lungs 2 (two) times daily.    Yes [provider]  furosemide (LASIX) 20 MG tablet Take 20 mg by mouth every morning.    Yes [provider]  losartan (COZAAR) 50 MG tablet Take 50 mg by mouth every morning.  09/21/11  Yes [provider]  montelukast (SINGULAIR)  10 MG tablet Take 10 mg by mouth at bedtime.    Yes [provider]  Multiple Vitamins-Minerals (MULTIVITAMIN PO) Take 1 tablet by mouth daily.    Yes [provider]  oxyCODONE-acetaminophen (PERCOCET/ROXICET) 5-325 MG tablet Take 1 tablet by mouth See admin instructions. Take 1 tablet by mouth four to five times a day   Yes [provider]  polyethylene glycol (MIRALAX) powder Take 17 g by mouth daily.    Yes [provider]  potassium citrate (UROCIT-K) 10 MEQ (1080 MG) SR tablet Take 10 mEq by mouth 3 (three) times daily with meals.    Yes [provider]  pregabalin (LYRICA) 150 MG capsule Take 150 mg by mouth 2 (two) times daily.   Yes [provider]  PRISTIQ 50 MG 24 hr tablet Take 50 mg by mouth daily. 04/20/15  Yes [provider]  rosuvastatin (CRESTOR) 10 MG tablet Take 5 mg by mouth every morning.    Yes [provider]  Secukinumab, 300 MG Dose, (COSENTYX, 300 MG DOSE,) 150 MG/ML SOSY Inject 300 mg into the skin every 30 (thirty) days.   Yes  [provider]  verapamil (COVERA HS) 240 MG (CO) 24 hr tablet Take 240 mg by mouth every morning.    Yes [provider]  pantoprazole (PROTONIX) 20 MG tablet Take 1 tablet (20 mg total) by mouth daily. 06/09/19 06/09/19  Albrizze, Caroleen HammanKaitlyn E, PA-C    Family History Family History  Problem Relation Age of Onset   Hypertension Father    Skin cancer Father    Heart disease Father        before age 55   Heart attack Father    Hyperlipidemia Father    Diabetes Mother    Hypertension Mother    Skin cancer Mother    Breast cancer Mother        Melanoma   Heart disease Mother    Hyperlipidemia Mother    Lung cancer Mother    Brain cancer Mother    Hypertension Sister    Heart disease Sister        before age 560   Heart attack Sister    Hyperlipidemia Sister    Breast cancer Maternal Aunt    Colon cancer Neg Hx    Colon polyps Neg Hx    Esophageal cancer Neg Hx    Gallbladder disease Neg Hx    Stomach cancer Neg Hx     Social History Social History   Tobacco Use   Smoking status: Former Smoker    Packs/day: 2.00    Types: Cigarettes    Quit date: 10/19/1982    Years since quitting: 36.8   Smokeless tobacco: Never Used  Substance Use Topics   Alcohol use: No    Alcohol/week: 0.0 standard drinks   Drug use: No     Allergies   Penicillins, Sulfonamide derivatives, Clindamycin/lincomycin, and Ciprofloxacin   Review of Systems Review of Systems  Ten systems reviewed and are negative for acute change, except as noted in the HPI.   Physical Exam Updated Vital Signs BP 125/69    Pulse 68    Temp 98.1 F (36.7 C) (Oral)    Resp 13    Ht 5\' 6"  (1.676 m)    Wt 85.3 kg    LMP 12/21/2015    SpO2 100%    BMI 30.34 kg/m   Physical Exam Vitals signs and nursing note reviewed.  Constitutional:  General: She is not in acute distress.    Appearance: She is well-developed. She is not diaphoretic.  HENT:     Head:  Normocephalic and atraumatic.  Eyes:     General: Vision grossly intact. Gaze aligned appropriately. No visual field deficit or scleral icterus.    Extraocular Movements: Extraocular movements intact.     Right eye: Normal extraocular motion.     Left eye: Normal extraocular motion.     Conjunctiva/sclera: Conjunctivae normal.     Right eye: Right conjunctiva is not injected. No chemosis, exudate or hemorrhage.    Left eye: Left conjunctiva is not injected. No chemosis, exudate or hemorrhage.    Pupils: Pupils are equal, round, and reactive to light.     Slit lamp exam:    Left eye: No hyphema or photophobia.      Comments: Contusion to the left eyelid.   No pain with eye movement  Neck:     Musculoskeletal: Normal range of motion.  Cardiovascular:     Rate and Rhythm: Normal rate and regular rhythm.     Heart sounds: Normal heart sounds. No murmur. No friction rub. No gallop.   Pulmonary:     Effort: Pulmonary effort is normal. No respiratory distress.     Breath sounds: Normal breath sounds.  Abdominal:     General: Bowel sounds are normal. There is no distension.     Palpations: Abdomen is soft. There is no mass.     Tenderness: There is no abdominal tenderness. There is no guarding.  Skin:    General: Skin is warm and dry.  Neurological:     Mental Status: She is alert and oriented to person, place, and time.     GCS: GCS eye subscore is 4. GCS verbal subscore is 5. GCS motor subscore is 6.     Cranial Nerves: Cranial nerves are intact.     Sensory: Sensation is intact.     Motor: Motor function is intact.     Coordination: Coordination is intact.     Gait: Gait is intact.     Deep Tendon Reflexes: Reflexes are normal and symmetric.  Psychiatric:        Behavior: Behavior normal.      ED Treatments / Results  Labs (all labs ordered are listed, but only abnormal results are displayed) Labs Reviewed - No data to display  EKG None  Radiology Ct Head Wo  Contrast  Result Date: 08/27/2019 CLINICAL DATA:  Head trauma, minor, GCS>=13 low clinical risk, initial exam. Trauma. EXAM: CT HEAD WITHOUT CONTRAST CT CERVICAL SPINE WITHOUT CONTRAST TECHNIQUE: Multidetector CT imaging of the head and cervical spine was performed following the standard protocol without intravenous contrast. Multiplanar CT image reconstructions of the cervical spine were also generated. COMPARISON:  Brain MRI 03/01/2019 FINDINGS: CT HEAD FINDINGS Brain: No evidence of acute intracranial hemorrhage. No demarcated cortical infarction. No evidence of intracranial mass. No midline shift or extra-axial fluid collection. Vascular: No hyperdense vessel. Skull: No calvarial fracture or suspicious osseous lesion. Sinuses/Orbits: Small left periorbital hematoma. No significant paranasal sinus disease or mastoid effusion at the imaged levels CT CERVICAL SPINE FINDINGS Alignment: Cervical levocurvature. No significant spondylolisthesis. Skull base and vertebrae: The basion-dental and atlanto-dental intervals are maintained.No evidence of acute fracture to the cervical spine. Soft tissues and spinal canal: No prevertebral fluid or swelling. No visible canal hematoma. Disc levels: No significant bony spinal canal or neural foraminal narrowing at any level. Upper chest: No consolidation within  the imaged lung apices. No visible pneumothorax. Incidentally noted right-sided aortic arch. IMPRESSION: CT head: No evidence of acute intracranial abnormality CT cervical spine: 1. No evidence of acute fracture to the cervical spine. 2. Incidentally noted right-sided aortic arch. Electronically Signed   By: Jackey Loge DO   On: 08/27/2019 10:14   Ct Cervical Spine Wo Contrast  Result Date: 08/27/2019 CLINICAL DATA:  Head trauma, minor, GCS>=13 low clinical risk, initial exam. Trauma. EXAM: CT HEAD WITHOUT CONTRAST CT CERVICAL SPINE WITHOUT CONTRAST TECHNIQUE: Multidetector CT imaging of the head and cervical spine  was performed following the standard protocol without intravenous contrast. Multiplanar CT image reconstructions of the cervical spine were also generated. COMPARISON:  Brain MRI 03/01/2019 FINDINGS: CT HEAD FINDINGS Brain: No evidence of acute intracranial hemorrhage. No demarcated cortical infarction. No evidence of intracranial mass. No midline shift or extra-axial fluid collection. Vascular: No hyperdense vessel. Skull: No calvarial fracture or suspicious osseous lesion. Sinuses/Orbits: Small left periorbital hematoma. No significant paranasal sinus disease or mastoid effusion at the imaged levels CT CERVICAL SPINE FINDINGS Alignment: Cervical levocurvature. No significant spondylolisthesis. Skull base and vertebrae: The basion-dental and atlanto-dental intervals are maintained.No evidence of acute fracture to the cervical spine. Soft tissues and spinal canal: No prevertebral fluid or swelling. No visible canal hematoma. Disc levels: No significant bony spinal canal or neural foraminal narrowing at any level. Upper chest: No consolidation within the imaged lung apices. No visible pneumothorax. Incidentally noted right-sided aortic arch. IMPRESSION: CT head: No evidence of acute intracranial abnormality CT cervical spine: 1. No evidence of acute fracture to the cervical spine. 2. Incidentally noted right-sided aortic arch. Electronically Signed   By: Jackey Loge DO   On: 08/27/2019 10:14    Procedures Procedures (including critical care time)  Medications Ordered in ED Medications - No data to display   Initial Impression / Assessment and Plan / ED Course  I have reviewed the triage vital signs and the nursing notes.  Pertinent labs & imaging results that were available during my care of the patient were reviewed by me and considered in my medical decision making (see chart for details).        Patient with mechanical fall today.  I personally reviewed the patient's head CT and CT C-spine  showed no evidence of intracranial abnormality or fracture of the C-spine on my interpretation.  Evaluation shows no neurologic deficits.  She is some mild bruising to left upper eye without laceration.  EOMs intact.  Vision intact.  No photophobia.  Patient appears appropriate for discharge at this time.  Given her concussion precautions.  Gust return precautions and home supportive care.  Final Clinical Impressions(s) / ED Diagnoses   Final diagnoses:  None    ED Discharge Orders    None       Arthor Captain, PA-C 08/27/19 1126    Arthor Captain, PA-C 08/27/19 1509    Gwyneth Sprout, MD 08/27/19 [phone]

## 2019-08-28 ENCOUNTER — Telehealth: Payer: Self-pay | Admitting: Diagnostic Neuroimaging

## 2019-08-28 NOTE — Telephone Encounter (Signed)
Pt son(on DPR) has called to report a recent fall, hospital states pt has a concussion.  Pt son is asking for a call from RN to discuss. Pt son is asking that pt be called @ [phone]

## 2019-08-28 NOTE — Telephone Encounter (Addendum)
Spoke with son, Linton Rump on [place] and asked if he has called her PCP for FU. He stated she has virtual visit with Dr Osborne Casco 08/30/19. I advised son that her CT scans were okay per ED dr and no neurological symptoms noted. I advised him after her VV with Dr Osborne Casco, if PCP feels Dr Leta Baptist needs to see her, he can refer and appointment can be scheduled. I asked if he has her ED AVS; he stated he does. I reviewed the list of symptoms on AVS that warrant immediate return to ED, and he acknowledged. He stated he was "trying to cover all his bases". I advised him I'll let Dr Leta Baptist know and if any further instructions, I'll call him back. He verbalized understanding, appreciation.

## 2019-08-29 NOTE — Telephone Encounter (Signed)
I called Dominique Evans and patient is scheduled in Jan. 5 th with Dr. Vella Redhead . Patient is not seeing  [name], MD .  Patient will come back to [name]

## 2019-08-31 ENCOUNTER — Telehealth: Payer: Self-pay | Admitting: Diagnostic Neuroimaging

## 2019-08-31 NOTE — Telephone Encounter (Signed)
We did end up receiving an urgent referral on pt for recent fall from [facility] at [facility]. I wanted to see if you had a place we could work pt in? If you need the referral to review, just let me know.  Thank you

## 2019-08-31 NOTE — Telephone Encounter (Signed)
Nikki scheduled patient's appointment with her son, Linton Rump, on  09/11/19.

## 2019-09-03 ENCOUNTER — Other Ambulatory Visit: Payer: Self-pay | Admitting: Gastroenterology

## 2019-09-11 ENCOUNTER — Other Ambulatory Visit: Payer: Self-pay

## 2019-09-11 ENCOUNTER — Ambulatory Visit (INDEPENDENT_AMBULATORY_CARE_PROVIDER_SITE_OTHER): Payer: 59 | Admitting: Diagnostic Neuroimaging

## 2019-09-11 ENCOUNTER — Encounter: Payer: Self-pay | Admitting: Diagnostic Neuroimaging

## 2019-09-11 VITALS — BP 132/81 | HR 65 | Temp 97.7°F | Ht 65.0 in | Wt 198.0 lb

## 2019-09-11 DIAGNOSIS — R413 Other amnesia: Secondary | ICD-10-CM | POA: Diagnosis not present
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
## 2019-09-22 ENCOUNTER — Encounter: Payer: Self-pay | Admitting: Podiatry

## 2019-09-22 ENCOUNTER — Ambulatory Visit (INDEPENDENT_AMBULATORY_CARE_PROVIDER_SITE_OTHER): Payer: 59 | Admitting: Podiatry

## 2019-09-22 ENCOUNTER — Other Ambulatory Visit: Payer: Self-pay

## 2019-09-22 DIAGNOSIS — B351 Tinea unguium: Secondary | ICD-10-CM | POA: Diagnosis not present

## 2019-09-22 DIAGNOSIS — E1140 Type 2 diabetes mellitus with diabetic neuropathy, unspecified: Secondary | ICD-10-CM | POA: Insufficient documentation

## 2019-09-22 DIAGNOSIS — M79674 Pain in right toe(s): Secondary | ICD-10-CM

## 2019-09-22 DIAGNOSIS — M79675 Pain in left toe(s): Secondary | ICD-10-CM | POA: Diagnosis not present

## 2019-09-22 DIAGNOSIS — L84 Corns and callosities: Secondary | ICD-10-CM | POA: Insufficient documentation

## 2019-09-22 HISTORY — DX: Type 2 diabetes mellitus with diabetic neuropathy, unspecified: E11.40

## 2019-09-22 NOTE — Progress Notes (Signed)
This patient presents to the office for nail care.  She was referrede by Dr.  Amalia Hailey.  She says she has history of foot injury left foot.  She says the nails are painful walking and wearing shoes.  She says her feet are numb with minimal feeling. She presents to the office for preventative foot care services.  General Appearance  Alert, conversant and in no acute stress.  Vascular  Dorsalis pedis and posterior tibial  pulses are palpable  bilaterally.  Capillary return is within normal limits  bilaterally. Temperature is within normal limits  bilaterally.  Neurologic  Senn-Weinstein monofilament wire test absent   bilaterally. Muscle power within normal limits bilaterally.  Nails Thick disfigured discolored nails with subungual debris  from hallux to fifth toes bilaterally. No evidence of bacterial infection or drainage bilaterally.  Orthopedic  No limitations of motion  feet .  No crepitus or effusions noted.  No bony pathology or digital deformities noted. Mild hallux varus left foot.  Skin  normotropic skin with no porokeratosis noted bilaterally.  No signs of infections or ulcers noted.  Pre-ulcerous callus left hallux.  Onychomycosis  B/L  Debride nails  X 10.  RTC 3 months.   Gardiner Barefoot DPM

## 2019-09-29 ENCOUNTER — Telehealth: Payer: Self-pay | Admitting: Diagnostic Neuroimaging

## 2019-09-29 NOTE — Telephone Encounter (Signed)
Pt called stating that she has had a headache for days and can not get rid of it. She is wanting to be advised on what she can do.

## 2019-10-02 NOTE — Telephone Encounter (Signed)
Spoke with patient who stated PCP advised she call here to discuss headache due to her concussion. She is taking ibuprofen 400 mg three x daily. I advised she alternate with Tylenol OTC  2 tabs every 4- 6 hours; try for a few days and call us back. I advised she stay well hydrated to help avoid headaches as well. I advised her that headaches from concussions can take up to a few months to resolve in some cases. She agreed to plan,  verbalized understanding, appreciation.

## 2019-10-24 ENCOUNTER — Other Ambulatory Visit: Payer: Self-pay

## 2019-10-24 ENCOUNTER — Ambulatory Visit (INDEPENDENT_AMBULATORY_CARE_PROVIDER_SITE_OTHER): Payer: No Typology Code available for payment source | Admitting: Psychology

## 2019-10-24 ENCOUNTER — Ambulatory Visit: Payer: No Typology Code available for payment source | Admitting: Psychology

## 2019-10-24 ENCOUNTER — Encounter: Payer: Self-pay | Admitting: Psychology

## 2019-10-24 DIAGNOSIS — G3184 Mild cognitive impairment, so stated: Secondary | ICD-10-CM

## 2019-10-24 DIAGNOSIS — R413 Other amnesia: Secondary | ICD-10-CM

## 2019-10-24 NOTE — Progress Notes (Signed)
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 none to slight range, while symptoms of depression were within normal limits. A questionnaire assessing the presence of PTSD symptoms was below threshold for an active diagnosis. A screening instrument assessing recent sleep quality suggested the presence of minimal sleep dysfunction.  Tables of Scores:   Note: This summary of test scores accompanies the interpretive report and should not be considered in isolation without reference to the appropriate sections in the text. Descriptors are based on appropriate normative data and may be adjusted based on clinical judgment. The terms "impaired" and "within normal limits (WNL)" are used when a more specific level of functioning cannot be determined.       Effort Testing:   DESCRIPTOR       Test of Memory Malingering (TOMM): --- --- Below Expectation    Trial 1 --- --- Below Expectation    Trial 2 --- --- Below Expectation    Retention --- --- Below Expectation  Dot Counting Test: --- --- Below Expectation  RBANS Effort Index: --- --- Within Expectation  WAIS-IV Reliable Digit Span: --- --- Within Expectation       Orientation:      Raw Score Percentile   NAB Orientation, Form 1 27/29 --- ---       Cognitive Screening:          RBANS, Form A: Standard Score/ Scaled Score Percentile   Total Score 57  <1 Exceptionally Low  Immediate Memory 49 <1 Exceptionally Low    List Learning 2 <1 Exceptionally Low    Story Memory 2 <1 Exceptionally Low  Visuospatial/Constructional 100 50 Average    Figure Copy 11 63 Average    Line Orientation 16/20 26-50 Average  Language 40 <1 Exceptionally Low    Picture Naming 1/10 <2 Exceptionally Low    Semantic Fluency 1 <1 Exceptionally Low  Attention 100 50 Average    Digit Span 14 91 Above Average  Coding 6 9 Below Average  Delayed Memory 44 <1 Exceptionally Low    List Recall 0/10 <2 Exceptionally Low    List Recognition 14/20 <2 Exceptionally Low    Story Recall 3 1 Exceptionally Low    Story Recognition 7/12 5-7 Well Below Average    Figure Recall 1 <1 Exceptionally Low    Figure Recognition 0/8 1 Exceptionally Low       Intellectual Functioning:           Standard Score Percentile   Test of Premorbid Functioning: 70 2 Well Below Average       Memory:          Attention/Executive Function:          Trail Making Test (TMT): Raw Score (T Score) Percentile     Part A 57 secs.,  0 errors (30) 2 Well Below Average    Part B 81 secs.,  0 errors (44) 27 Average        Scaled Score Percentile   WAIS-IV Coding: 7 16 Below Average        Scaled Score Percentile   WAIS-IV Digit Span: 9 37 Average    Forward 9 37 Average    Backward 12 75 Above Average    Sequencing 7 16 Below Average            D-KEFS Color-Word Interference Test: Raw Score (Scaled Score) Percentile     Color Naming 62 secs. (1) <1 Exceptionally Low    Word Reading 33 secs. (4) 2 Well Below Average    Inhibition 129 secs. (1) <1 Exceptionally Low      Total Errors 6 errors (3) 1 Exceptionally Low    Inhibition/Switching 122 secs. (2) <1 Exceptionally Low      Total Errors 5 errors (7) 16 Below Average       D-KEFS Verbal Fluency Test: Raw Score (Scaled Score) Percentile     Letter Total Correct 13 (3) 1 Exceptionally Low    Category Total Correct 11 (1) <1  Exceptionally Low    Category Switching Total Correct 4 (1) <1 Exceptionally Low    Category Switching Accuracy 3 (1) <1 Exceptionally Low      Total Set Loss Errors 1 (11) 63 Average      Total Repetition Errors 2 (11) 63 Average       D-KEFS 20 Questions Test: Scaled Score Percentile     Total Weighted Achievement Score Discontinued --- Impaired    Initial Abstraction Score --- --- ---       NAB Executive Functions Module, Form 1: T Score Percentile     Judgment 19 <1 Exceptionally Low       Language:          NAB Language Module, Form 1: T Score Percentile     Auditory Comprehension 19 <1 Exceptionally Low    Naming 0/31 (19) <1 Exceptionally Low       Visuospatial/Visuoconstruction:           Scaled Score Percentile   WAIS-IV Matrix Reasoning: 11 63 Average       Mood and Personality:      Raw Score Percentile   Beck Depression Inventory - II: 12 --- Within Normal Limits  PROMIS Anxiety Questionnaire: 11 --- None to Slight  PTSD Checklist for DSM-5: 16 --- Below Threshold       Additional Questionnaires:      Raw Score Percentile   PROMIS Sleep Disturbance Questionnaire: 14 --- None  to Slight   Informed Consent and Coding/Compliance:   Ms. Disbrow was provided with a verbal description of the nature and purpose of the present neuropsychological evaluation. Also reviewed were the foreseeable risks and/or discomforts and benefits of the procedure, limits of confidentiality, and mandatory reporting requirements of this provider. The patient was given the opportunity to ask questions and receive answers about the evaluation. Oral consent to participate was provided by the patient.   This evaluation was conducted by Newman Nickels, Ph.D., licensed clinical neuropsychologist. Ms. Strohm completed a 30-minute clinical interview, billed as one unit [phone], and 195 minutes of cognitive testing, billed as one unit [phone] and six additional units [phone]. Psychometrist Wallace Keller,  B.S., assisted Dr. Milbert Coulter with test administration and scoring procedures. As a separate and discrete service, Dr. Milbert Coulter spent a total of 180 minutes in interpretation and report writing, billed as one unit 96132 and two units 96133.

## 2019-10-24 NOTE — Progress Notes (Signed)
   Psychometrician Note   Dominique Evans completed 180 minutes of neuropsychological testing with technician, Wallace Keller, B.S., under the supervision of Dr. Newman Nickels, Ph.D., licensed psychologist. The patient did not appear overtly distressed by the testing session, per behavioral observation or via self-report to the technician. Rest breaks were offered.    In considering the patient's current level of functioning, level of presumed impairment, nature of symptoms, emotional and behavioral responses during the interview, level of literacy, and observed level of motivation/effort, a battery of tests was selected and communicated to the psychometrician.   Communication between the psychologist and technician was ongoing throughout the testing session and changes were made as deemed necessary based on patient performance on testing, technician observations and additional pertinent factors such as those listed above.   Dominique Evans will return within approximately two weeks for an interactive feedback session with Dr. Milbert Coulter at which time his test performances, clinical impressions, and treatment recommendations will be reviewed in detail. The patient understands she can contact our office should she require our assistance before this time.  180 minutes were spent face-to-face with patient administering standardized tests and 15 minutes were spent scoring (technician). [CPT [id number], [id number]]  This note reflects time spent with the psychometrician and does not include test scores or any clinical interpretations made by Dr. Milbert Coulter. The full report will follow in a separate note.

## 2019-10-25 ENCOUNTER — Encounter: Payer: Self-pay | Admitting: Psychology

## 2019-10-25 DIAGNOSIS — G3184 Mild cognitive impairment, so stated: Secondary | ICD-10-CM

## 2019-10-25 HISTORY — DX: Mild cognitive impairment of uncertain or unknown etiology: G31.84

## 2019-10-31 ENCOUNTER — Ambulatory Visit (INDEPENDENT_AMBULATORY_CARE_PROVIDER_SITE_OTHER): Payer: No Typology Code available for payment source | Admitting: Psychology

## 2019-10-31 ENCOUNTER — Other Ambulatory Visit: Payer: Self-pay

## 2019-10-31 ENCOUNTER — Encounter: Payer: Self-pay | Admitting: Psychology

## 2019-10-31 DIAGNOSIS — G3184 Mild cognitive impairment, so stated: Secondary | ICD-10-CM | POA: Diagnosis not present

## 2019-10-31 NOTE — Progress Notes (Signed)
   Neuropsychology Feedback Session Eligha Bridegroom. [facility] Department of Neurology  Reason for Referral:   Nayomi Tabron a 54 y.o. Caucasian female referred by Joycelyn Schmid, M.D.,to characterize hercurrent cognitive functioning and assist with diagnostic clarity and treatment planning in the context of subjective memory decline, recent history of a concussion, prior history of a more significant head injury, several medical comorbidities, and possible psychiatric contributions.  Feedback:   Ms. Elwell completed a comprehensive neuropsychological evaluation on 10/24/2019. Please refer to that encounter for the full report and recommendations. Briefly, scores across stand-alone and embedded performance validity measures were variable. As such, lower scores across the current evaluation should be interpreted with caution as they potentially underestimate Ms. Harr's current cognitive functioning. If taken at face value, Ms. Burrill's pattern of performance is suggestive of largely diffuse cognitive impairment with notable deficits surrounding semantic knowledge as evidenced by profound difficulties with confrontation naming, verbal comprehension, and visual agnosia. Given evidence for cognitive dysfunction, coupled with Ms. Kimberley's report of intact ADLs, she meets criteria for a mild neurocognitive disorder (formerly "mild cognitive impairment"). The etiology of her deficits is unclear. 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 However, neuroimaging did not suggest cerebral atrophy in the frontal or temporal lobes (atrophy is commonly seen in the inferolateral temporal cortex, left greater than right, in semantic dementia cases). As such, she does not appear to exhibit anatomical correlates for this condition. Alzheimer's disease cannot be ruled out; however, despite amnestic performance across some memory tasks,  she exhibited significant difficulties learning novel information initially to the extent that she likely never learned this information in the first place. As such, the presence of rapid forgetting is difficult to identify. Of note, scores across mood-related questionnaires did not suggest the presence of clinically significant levels of anxiety or depression, nor raise concerns for PTSD.  Ms. Klawitter was unaccompanied during the current telephone call. Content of the current session focused on the results of her neuropsychological evaluation. Ms. Rennie was given the opportunity to ask questions and her questions were answered. She was also encouraged to reach out should additional questions arise. A copy of her report was provided/mailed at the conclusion of the visit.      Between 30 and 60 minutes were spent preparing for and conducting the current feedback session with Ms. Dana Allan, billed as one unit [phone].

## 2019-11-01 ENCOUNTER — Telehealth: Payer: Self-pay | Admitting: Diagnostic Neuroimaging

## 2019-11-01 NOTE — Telephone Encounter (Signed)
Spoke with patient and informed her Dr Marjory Lies is able to read Dr Tammy Sours notes in her EMR. She stated Dr Milbert Coulter told her to follow up with Dr Marjory Lies. I advised will inform Dr Marjory Lies to review Dr Tammy Sours note and call her with his recommendations. Patient verbalized understanding, appreciation.

## 2019-11-01 NOTE — Telephone Encounter (Signed)
Pt called wanting to know if Dr. Milbert Coulter whom she has seen on the 5th of Jan has sent over the records of that day so that she can follow up. Please advise.

## 2019-11-01 NOTE — Telephone Encounter (Signed)
I reviewed notes from Dr. Milbert Coulter. Monitor symptoms and follow recommendations for brain health activities and supportive care. Follow up with PCP. -VRP

## 2019-11-01 NOTE — Telephone Encounter (Signed)
Spoke with patient and advised her Dr Marjory Lies reviewed Dr Tammy Sours notes. Advised her of Dr Richrd Humbles recommendations. Patient verbalized understanding, appreciation.

## 2019-12-13 ENCOUNTER — Telehealth: Payer: Self-pay | Admitting: Diagnostic Neuroimaging

## 2019-12-13 NOTE — Telephone Encounter (Signed)
Pt has called asking for a letter from Dr Marjory Lies stating that she should no longer take calls on her job due to her having difficulty remembering certain words and he voice as well, please call.

## 2019-12-14 NOTE — Telephone Encounter (Signed)
Called patient and informed her Dr Marjory Lies advised she FU with her PCP, pain management, and psychiatry or psychology. He would be glad to refer to psychiatry if she would like.  He advised that from a neurological standpoint he has no further recommendations.  Advised she call her PCP and request a letter for her work. She verbalized understanding, appreciation.

## 2019-12-15 ENCOUNTER — Other Ambulatory Visit: Payer: Self-pay

## 2019-12-15 ENCOUNTER — Ambulatory Visit (INDEPENDENT_AMBULATORY_CARE_PROVIDER_SITE_OTHER): Payer: 59 | Admitting: Podiatry

## 2019-12-15 ENCOUNTER — Other Ambulatory Visit: Payer: Self-pay | Admitting: Internal Medicine

## 2019-12-15 ENCOUNTER — Encounter: Payer: Self-pay | Admitting: Podiatry

## 2019-12-15 DIAGNOSIS — B351 Tinea unguium: Secondary | ICD-10-CM

## 2019-12-15 DIAGNOSIS — E0843 Diabetes mellitus due to underlying condition with diabetic autonomic (poly)neuropathy: Secondary | ICD-10-CM

## 2019-12-15 DIAGNOSIS — M79674 Pain in right toe(s): Secondary | ICD-10-CM

## 2019-12-15 DIAGNOSIS — M79675 Pain in left toe(s): Secondary | ICD-10-CM | POA: Diagnosis not present

## 2019-12-15 DIAGNOSIS — L989 Disorder of the skin and subcutaneous tissue, unspecified: Secondary | ICD-10-CM

## 2019-12-15 DIAGNOSIS — Z1231 Encounter for screening mammogram for malignant neoplasm of breast: Secondary | ICD-10-CM

## 2019-12-15 DIAGNOSIS — L84 Corns and callosities: Secondary | ICD-10-CM

## 2019-12-15 NOTE — Progress Notes (Signed)
This patient presents to the office for nail care.  She was referred by Dr.  Logan Bores.  She says she has history of foot injury left foot.  She says the nails are painful walking and wearing shoes.  She says her feet are numb with minimal feeling. She presents to the office for preventative foot care services.  General Appearance  Alert, conversant and in no acute stress.  Vascular  Dorsalis pedis and posterior tibial  pulses are palpable  bilaterally.  Capillary return is within normal limits  bilaterally. Temperature is within normal limits  bilaterally.  Neurologic  Senn-Weinstein monofilament wire test absent   bilaterally. Muscle power within normal limits bilaterally.  Nails Thick disfigured discolored nails with subungual debris  from hallux to fifth toes bilaterally. No evidence of bacterial infection or drainage bilaterally.  Orthopedic  No limitations of motion  feet .  No crepitus or effusions noted.  No bony pathology or digital deformities noted. Mild hallux varus left foot.  Skin  normotropic skin with no porokeratosis noted bilaterally.  No signs of infections or ulcers noted.  Pre-ulcerous callus left hallux.  Onychomycosis  B/L  Debride nails  X 10.  RTC 3 months.   Helane Gunther DPM

## 2020-01-15 ENCOUNTER — Encounter: Payer: Self-pay | Admitting: Licensed Clinical Social Worker

## 2020-01-15 ENCOUNTER — Ambulatory Visit (INDEPENDENT_AMBULATORY_CARE_PROVIDER_SITE_OTHER): Payer: No Typology Code available for payment source | Admitting: Licensed Clinical Social Worker

## 2020-01-15 ENCOUNTER — Other Ambulatory Visit: Payer: Self-pay

## 2020-01-15 DIAGNOSIS — F0280 Dementia in other diseases classified elsewhere without behavioral disturbance: Secondary | ICD-10-CM | POA: Diagnosis not present

## 2020-01-15 DIAGNOSIS — F329 Major depressive disorder, single episode, unspecified: Secondary | ICD-10-CM

## 2020-01-15 DIAGNOSIS — F0393 Unspecified dementia, unspecified severity, with mood disturbance: Secondary | ICD-10-CM

## 2020-01-15 NOTE — Progress Notes (Signed)
Virtual Visit via Telephone Note  I connected with Dominique Evans on 01/15/20 at  2:00 PM EDT by telephone and verified that I am speaking with the correct person using two identifiers.   I discussed the limitations, risks, security and privacy concerns of performing an evaluation and management service by telephone and the availability of in person appointments. I also discussed with the patient that there may be a patient responsible charge related to this service. The patient expressed understanding and agreed to proceed.  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  (See scanned document in Chart Review)  CCA Part Two A  Intake/Chief Complaint:  CCA Intake With Chief Complaint CCA Part Two Date: 01/15/20 CCA Part Two Time: 47 Chief Complaint/Presenting Problem: Pt presents as a 54 year old, Caucasian female for assessment. Pt was referred by neurologist and is seeking treatment for memory loss possibly triggered by bereavement/trauma. Pt reported "I had multiple accidents in which I hit my head. I don't remember things, not even my own kids. It has been about one year since this occurred. My memory is going away".  Pt denied feeling depressed and anxious. Pt reported "I try to be positive" and continues to enjoy working. One of patient's sons has become her medical power of attorney and explained reason for referral to therapy as patient struggled to identify need or desire for therapy. Patients Currently Reported Symptoms/Problems: Memory Loss, Confusion, Work Related Issues, Bereavement, Cognitive Decline Collateral Involvement: Pt's son was present for some of the assessment to clarify reason for therapy referral. Individual's Strengths: Pt reported "doing well at work". Individual's  Preferences: Pt reported she has received therapy in the past and it has "always been helpful". Individual's Abilities: Pt continues to work from home [employer] and is supported by her adult children. Type of Services Patient Feels Are Needed: Individual Therapy and Medication Management Initial Clinical Notes/Concerns: N/A  Mental Health Symptoms Depression:  Depression: Difficulty Concentrating, Fatigue  Mania:  Mania: N/A  Anxiety:   Anxiety: N/A  Psychosis:  Psychosis: N/A  Trauma:  Trauma: N/A  Obsessions:  Obsessions: N/A  Compulsions:  Compulsions: N/A  Inattention:  Inattention: Disorganized, Forgetful  Hyperactivity/Impulsivity:  Hyperactivity/Impulsivity: N/A  Oppositional/Defiant Behaviors:  Oppositional/Defiant Behaviors: N/A  Borderline Personality:  Emotional Irregularity: N/A  Other Mood/Personality Symptoms:  Other Mood/Personality Symtpoms: N/A   Mental Status Exam Appearance and self-care  Stature:  Stature: Average  Weight:  Weight: Average weight  Clothing:  Clothing: Casual  Grooming:  Grooming: Normal  Cosmetic use:  Cosmetic Use: None  Posture/gait:  Posture/Gait: Normal  Motor activity:  Motor Activity: Not Remarkable  Sensorium  Attention:  Attention: Confused  Concentration:  Concentration: Scattered, Focuses on irrelevancies  Orientation:  Orientation: Object, Person, Place, Time  Recall/memory:  Recall/Memory: Defective in short-term  Affect and Mood  Affect:  Affect: Appropriate  Mood:  Mood: Anxious  Relating  Eye contact:  Eye Contact: Normal  Facial expression:  Facial Expression: Responsive  Attitude toward examiner:  Attitude Toward Examiner: Cooperative  Thought and Language  Speech flow: Speech Flow: Normal  Thought content:  Thought Content: Appropriate to mood and circumstances  Preoccupation:  Preoccupations: (N/A)  Hallucinations:  Hallucinations: (N/A)  Organization:  Company secretary of Knowledge:   Fund of Knowledge: Impoverished by:  (Comment)(cognitive decline)  Intelligence:  Intelligence: Needs investigation  Abstraction:  Abstraction: Concrete  Judgement:  Judgement: Poor  Reality Testing:  Reality Testing: Adequate  Insight:  Insight: Poor  Decision Making:  Decision Making: Confused  Social Functioning  Social Maturity:  Social Maturity: Responsible  Social Judgement:  Social Judgement: Normal  Stress  Stressors:  Stressors: Work, Grief/losses(Memory loss)  Coping Ability:  Coping Ability: Engineer, agricultural Deficits:     Supports:      Family and Psychosocial History: Family history Marital status: Single Are you sexually active?: No Does patient have children?: Yes How many children?: 3 How is patient's relationship with their children?: Pt reported she currently lives with one of her adult son's who is also her medical power of attorney and visits with her other adult sons often.  Childhood History:  Childhood History By whom was/is the patient raised?: Both parents Description of patient's relationship with caregiver when they were a child: Pt reported "very positive". Patient's description of current relationship with people who raised him/her: Parents are deceased. 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 Did You Receive Any Psychiatric Treatment/Services While in the Military?: No Are There Guns or Other Weapons in Your Home?: No  Education: Education Last Grade Completed: 12 Did You Graduate From [employer]?: Yes Did You Attend College?: Yes What Type of College Degree Do you Have?: Pt reported "I don't remember". Did You Attend Graduate School?: No Did You Have An Individualized Education Program (IIEP): No Did You Have Any Difficulty At School?: No  Religion: Religion/Spirituality Are You A Religious Person?: Yes What is Your Religious Affiliation?: Muslim  Leisure/Recreation: Leisure / Recreation Leisure and Hobbies: Pt reported "walking".  Exercise/Diet: Exercise/Diet Do You Exercise?: Yes What Type of Exercise Do You Do?: Run/Walk How Many Times a Week Do You Exercise?: Daily(2-3 miles per day) Have You Gained or Lost A Significant Amount of Weight in the Past Six Months?: No Do You Follow a Special Diet?: No Do You Have Any Trouble Sleeping?: No  CCA Part Two C  Alcohol/Drug Use: Alcohol / Drug Use Pain Medications: N/A Prescriptions: N/A Over the Counter: N/A History of alcohol / drug use?: No history of alcohol / drug abuse Longest period of sobriety (when/how long): N/A Negative Consequences of Use: (N/A) Withdrawal Symptoms: (N/A)                      CCA Part Three  Substance use Disorder (SUD) Substance Use Disorder (SUD)  Checklist Symptoms of Substance Use: (N/A)  Social Function:  Social Functioning Social Maturity: Responsible Social Judgement: Normal  Stress:  Stress Stressors: Work, Grief/losses(Memory  loss) Coping Ability: Resilient Patient Takes Medications The Way The Doctor Instructed?: Yes Priority Risk: Low Acuity  Risk Assessment-  Self-Harm Potential: Risk Assessment For Self-Harm Potential Thoughts of Self-Harm: No current thoughts Method: No plan Availability of Means: No access/NA Additional Information for Self-Harm Potential: (N/A) Additional Comments for Self-Harm Potential: N/A  Risk Assessment -Dangerous to Others Potential: Risk Assessment For Dangerous to Others Potential Method: No Plan Availability of Means: No access or NA Intent: Vague intent or NA Notification Required: No need or identified person Additional Information for Danger to Others Potential: (N/A) Additional Comments for Danger to Others Potential: N/A  DSM5 Diagnoses: Patient Active Problem List   Diagnosis Date Noted  . Mild neurocognitive disorder, unclear etiology 10/25/2019  . Pain due to onychomycosis of toenails of both feet 09/22/2019  . Diabetic neuropathy (HCC) 09/22/2019  . Pre-ulcerative corn or callous 09/22/2019  . Esophageal dysphagia 06/10/2019  . Intractable nausea and vomiting 06/09/2019  . Cellulitis of left foot 04/27/2019  . Hyperlipidemia 02/16/2019  . Chronic pain 09/24/2018  . Incisional hernia 09/24/2018  . Low back pain 09/24/2018  . Major depressive disorder with single episode 09/24/2018  . 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  . Esophageal dysmotility 03/01/2015  . Lateral ventral hernia 03/01/2015  . S/P lumbar spinal fusion 05/23/2014  . Edema 06/28/2013  . 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 HNP (herniated nucleus pulposus), lumbar 11/30/2012  . Thoracic aortic aneurysm 10/23/2010  . Constipation, chronic 03/23/2010  . Obesity, unspecified 10/31/2007  . Obstructive sleep apnea 10/31/2007  . Chronic rhinitis 10/31/2007  . Cough variant asthma 10/31/2007  . GERD (gastroesophageal reflux disease) 10/31/2007  . Type 2 diabetes mellitus with foot ulcer (HCC) 10/04/2007  . Hypertension associated with diabetes (HCC) 10/04/2007  . 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/04/2007     Patient Centered Plan: Patient is on the following Treatment Plan(s):  Depression  Recommendations for Services/Supports/Treatments: Recommendations for Services/Supports/Treatments Recommendations For Services/Supports/Treatments: Individual Therapy, Medication Management  Treatment Plan Summary: Long Term Goal: Develop the ability to recognize, accept, and cope with feelings of depression Short Term Goals: Marland Kitchen Verbally identify, if possible, the source of depressed mood . Discuss the nature of the relationship with the deceased significant other, reminiscing about a time spent together . Begin to experience sadness in session while discussing the disappointment related to the loss or pain from the past . Learn and implement personal skills for managing stress, solving daily problems, and resolving conflicts effectively . Show evidence of daily care for personal grooming and hygiene with minimal reminders from others  Follow Up Instructions: I discussed the assessment and treatment plan with the patient. The patient was provided an opportunity to ask questions and all were answered. The patient agreed with the plan and demonstrated an understanding of the instructions.   The patient was advised to call back or seek an in-person evaluation if the symptoms worsen or if the condition fails to improve as anticipated.  I provided 60 minutes of non-face-to-face time during this encounter.   Floree Zuniga Arnette Felts, LCSW, LCAS

## 2020-01-23 ENCOUNTER — Emergency Department (HOSPITAL_COMMUNITY): Payer: No Typology Code available for payment source

## 2020-01-23 ENCOUNTER — Inpatient Hospital Stay (HOSPITAL_COMMUNITY)
Admission: EM | Admit: 2020-01-23 | Discharge: 2020-01-26 | DRG: 603 | Disposition: A | Discharge status: Home / Self Care | Payer: No Typology Code available for payment source | Attending: Internal Medicine | Admitting: Internal Medicine

## 2020-01-23 ENCOUNTER — Encounter (HOSPITAL_COMMUNITY): Payer: Self-pay

## 2020-01-23 DIAGNOSIS — I152 Hypertension secondary to endocrine disorders: Secondary | ICD-10-CM | POA: Diagnosis present

## 2020-01-23 DIAGNOSIS — J45909 Unspecified asthma, uncomplicated: Secondary | ICD-10-CM | POA: Diagnosis present

## 2020-01-23 DIAGNOSIS — G3184 Mild cognitive impairment, so stated: Secondary | ICD-10-CM | POA: Diagnosis present

## 2020-01-23 DIAGNOSIS — Z888 Allergy status to other drugs, medicaments and biological substances status: Secondary | ICD-10-CM

## 2020-01-23 DIAGNOSIS — Z9884 Bariatric surgery status: Secondary | ICD-10-CM

## 2020-01-23 DIAGNOSIS — R509 Fever, unspecified: Secondary | ICD-10-CM | POA: Diagnosis not present

## 2020-01-23 DIAGNOSIS — Z87442 Personal history of urinary calculi: Secondary | ICD-10-CM

## 2020-01-23 DIAGNOSIS — K5909 Other constipation: Secondary | ICD-10-CM | POA: Diagnosis present

## 2020-01-23 DIAGNOSIS — R6510 Systemic inflammatory response syndrome (SIRS) of non-infectious origin without acute organ dysfunction: Secondary | ICD-10-CM | POA: Diagnosis present

## 2020-01-23 DIAGNOSIS — E119 Type 2 diabetes mellitus without complications: Secondary | ICD-10-CM

## 2020-01-23 DIAGNOSIS — R000 Tachycardia, unspecified: Secondary | ICD-10-CM | POA: Diagnosis present

## 2020-01-23 DIAGNOSIS — Z881 Allergy status to other antibiotic agents status: Secondary | ICD-10-CM

## 2020-01-23 DIAGNOSIS — F410 Panic disorder [episodic paroxysmal anxiety] without agoraphobia: Secondary | ICD-10-CM | POA: Diagnosis present

## 2020-01-23 DIAGNOSIS — M4310 Spondylolisthesis, site unspecified: Secondary | ICD-10-CM | POA: Diagnosis present

## 2020-01-23 DIAGNOSIS — Z8249 Family history of ischemic heart disease and other diseases of the circulatory system: Secondary | ICD-10-CM

## 2020-01-23 DIAGNOSIS — D849 Immunodeficiency, unspecified: Secondary | ICD-10-CM | POA: Diagnosis present

## 2020-01-23 DIAGNOSIS — G4733 Obstructive sleep apnea (adult) (pediatric): Secondary | ICD-10-CM | POA: Diagnosis present

## 2020-01-23 DIAGNOSIS — Z905 Acquired absence of kidney: Secondary | ICD-10-CM

## 2020-01-23 DIAGNOSIS — Z20822 Contact with and (suspected) exposure to covid-19: Secondary | ICD-10-CM | POA: Diagnosis present

## 2020-01-23 DIAGNOSIS — Z83438 Family history of other disorder of lipoprotein metabolism and other lipidemia: Secondary | ICD-10-CM

## 2020-01-23 DIAGNOSIS — I10 Essential (primary) hypertension: Secondary | ICD-10-CM

## 2020-01-23 DIAGNOSIS — A419 Sepsis, unspecified organism: Secondary | ICD-10-CM

## 2020-01-23 DIAGNOSIS — E1140 Type 2 diabetes mellitus with diabetic neuropathy, unspecified: Secondary | ICD-10-CM | POA: Diagnosis present

## 2020-01-23 DIAGNOSIS — E1169 Type 2 diabetes mellitus with other specified complication: Secondary | ICD-10-CM | POA: Diagnosis present

## 2020-01-23 DIAGNOSIS — L03211 Cellulitis of face: Secondary | ICD-10-CM | POA: Diagnosis not present

## 2020-01-23 DIAGNOSIS — K219 Gastro-esophageal reflux disease without esophagitis: Secondary | ICD-10-CM | POA: Diagnosis present

## 2020-01-23 DIAGNOSIS — Z79899 Other long term (current) drug therapy: Secondary | ICD-10-CM

## 2020-01-23 DIAGNOSIS — E785 Hyperlipidemia, unspecified: Secondary | ICD-10-CM

## 2020-01-23 DIAGNOSIS — Z801 Family history of malignant neoplasm of trachea, bronchus and lung: Secondary | ICD-10-CM

## 2020-01-23 DIAGNOSIS — Z803 Family history of malignant neoplasm of breast: Secondary | ICD-10-CM

## 2020-01-23 DIAGNOSIS — B349 Viral infection, unspecified: Secondary | ICD-10-CM | POA: Diagnosis present

## 2020-01-23 DIAGNOSIS — E860 Dehydration: Secondary | ICD-10-CM | POA: Diagnosis not present

## 2020-01-23 DIAGNOSIS — Z7951 Long term (current) use of inhaled steroids: Secondary | ICD-10-CM

## 2020-01-23 DIAGNOSIS — Z23 Encounter for immunization: Secondary | ICD-10-CM

## 2020-01-23 DIAGNOSIS — E1159 Type 2 diabetes mellitus with other circulatory complications: Secondary | ICD-10-CM

## 2020-01-23 DIAGNOSIS — G8929 Other chronic pain: Secondary | ICD-10-CM | POA: Diagnosis present

## 2020-01-23 DIAGNOSIS — Z87891 Personal history of nicotine dependence: Secondary | ICD-10-CM

## 2020-01-23 DIAGNOSIS — Z882 Allergy status to sulfonamides status: Secondary | ICD-10-CM

## 2020-01-23 DIAGNOSIS — Z808 Family history of malignant neoplasm of other organs or systems: Secondary | ICD-10-CM

## 2020-01-23 DIAGNOSIS — L409 Psoriasis, unspecified: Secondary | ICD-10-CM | POA: Diagnosis present

## 2020-01-23 DIAGNOSIS — M791 Myalgia, unspecified site: Secondary | ICD-10-CM

## 2020-01-23 DIAGNOSIS — Z79891 Long term (current) use of opiate analgesic: Secondary | ICD-10-CM

## 2020-01-23 DIAGNOSIS — Z833 Family history of diabetes mellitus: Secondary | ICD-10-CM

## 2020-01-23 LAB — COMPREHENSIVE METABOLIC PANEL
ALT: 16 U/L (ref 0–44)
AST: 25 U/L (ref 15–41)
Albumin: 3.8 g/dL (ref 3.5–5.0)
Alkaline Phosphatase: 92 U/L (ref 38–126)
Anion gap: 11 (ref 5–15)
BUN: 16 mg/dL (ref 6–20)
CO2: 26 mmol/L (ref 22–32)
Calcium: 8.9 mg/dL (ref 8.9–10.3)
Chloride: 101 mmol/L (ref 98–111)
Creatinine, Ser: 1.12 mg/dL — ABNORMAL HIGH (ref 0.44–1.00)
GFR calc Af Amer: >60 mL/min (ref >60)
GFR calc non Af Amer: 55 mL/min — ABNORMAL LOW (ref >60)
Glucose, Bld: 105 mg/dL — ABNORMAL HIGH (ref 70–99)
Potassium: 4 mmol/L (ref 3.5–5.1)
Sodium: 138 mmol/L (ref 135–145)
Total Bilirubin: 0.6 mg/dL (ref 0.3–1.2)
Total Protein: 7.1 g/dL (ref 6.5–8.1)

## 2020-01-23 LAB — CBC WITH DIFFERENTIAL/PLATELET
Abs Immature Granulocytes: 0.02 10*3/uL (ref 0.00–0.07)
Basophils Absolute: 0 10*3/uL (ref 0.0–0.1)
Basophils Relative: 0 %
Eosinophils Absolute: 0 10*3/uL (ref 0.0–0.5)
Eosinophils Relative: 0 %
HCT: 37.8 % (ref 36.0–46.0)
Hemoglobin: 11.8 g/dL — ABNORMAL LOW (ref 12.0–15.0)
Immature Granulocytes: 0 %
Lymphocytes Relative: 16 %
Lymphs Abs: 1.2 10*3/uL (ref 0.7–4.0)
MCH: 28.2 pg (ref 26.0–34.0)
MCHC: 31.2 g/dL (ref 30.0–36.0)
MCV: 90.4 fL (ref 80.0–100.0)
Monocytes Absolute: 0.6 10*3/uL (ref 0.1–1.0)
Monocytes Relative: 8 %
Neutro Abs: 5.4 10*3/uL (ref 1.7–7.7)
Neutrophils Relative %: 76 %
Platelets: 174 10*3/uL (ref 150–400)
RBC: 4.18 MIL/uL (ref 3.87–5.11)
RDW: 12.9 % (ref 11.5–15.5)
WBC: 7.2 10*3/uL (ref 4.0–10.5)
nRBC: 0 % (ref 0.0–0.2)

## 2020-01-23 LAB — URINALYSIS, ROUTINE W REFLEX MICROSCOPIC
Bilirubin Urine: NEGATIVE
Glucose, UA: NEGATIVE mg/dL
Hgb urine dipstick: NEGATIVE
Ketones, ur: NEGATIVE mg/dL
Leukocytes,Ua: NEGATIVE
Nitrite: NEGATIVE
Protein, ur: NEGATIVE mg/dL
Specific Gravity, Urine: 1.008 (ref 1.005–1.030)
pH: 7 (ref 5.0–8.0)

## 2020-01-23 LAB — LACTIC ACID, PLASMA
Lactic Acid, Venous: 0.9 mmol/L (ref 0.5–1.9)
Lactic Acid, Venous: 1.1 mmol/L (ref 0.5–1.9)

## 2020-01-23 LAB — LIPASE, BLOOD: Lipase: 20 U/L (ref 11–51)

## 2020-01-23 LAB — CBG MONITORING, ED: Glucose-Capillary: 125 mg/dL — ABNORMAL HIGH (ref 70–99)

## 2020-01-23 LAB — TROPONIN I (HIGH SENSITIVITY)
Troponin I (High Sensitivity): 6 ng/L (ref <18)
Troponin I (High Sensitivity): 5 ng/L (ref <18)

## 2020-01-23 LAB — POC SARS CORONAVIRUS 2 AG -  ED: SARS Coronavirus 2 Ag: NEGATIVE

## 2020-01-23 LAB — SARS CORONAVIRUS 2 (TAT 6-24 HRS): SARS Coronavirus 2: NEGATIVE

## 2020-01-23 LAB — CK: Total CK: 141 U/L (ref 38–234)

## 2020-01-23 MED ORDER — VANCOMYCIN HCL 1750 MG/350ML IV SOLN
1750.0000 mg | Freq: Once | INTRAVENOUS | Status: AC
  Administered 2020-01-24: 1750 mg via INTRAVENOUS
  Filled 2020-01-23: qty 350

## 2020-01-23 MED ORDER — SODIUM CHLORIDE 0.9 % IV SOLN
2.0000 g | Freq: Once | INTRAVENOUS | Status: AC
  Administered 2020-01-23: 2 g via INTRAVENOUS
  Filled 2020-01-23: qty 2

## 2020-01-23 MED ORDER — SODIUM CHLORIDE 0.9 % IV BOLUS
1000.0000 mL | Freq: Once | INTRAVENOUS | Status: AC
  Administered 2020-01-23: 1000 mL via INTRAVENOUS

## 2020-01-23 MED ORDER — ACETAMINOPHEN 500 MG PO TABS
1000.0000 mg | ORAL_TABLET | Freq: Once | ORAL | Status: AC
  Administered 2020-01-23: 19:00:00 1000 mg via ORAL
  Filled 2020-01-23: qty 2

## 2020-01-23 MED ORDER — FENTANYL CITRATE (PF) 100 MCG/2ML IJ SOLN
12.5000 ug | Freq: Once | INTRAMUSCULAR | Status: AC
  Administered 2020-01-23: 12.5 ug via INTRAVENOUS
  Filled 2020-01-23: qty 2

## 2020-01-23 MED ORDER — IBUPROFEN 400 MG PO TABS
400.0000 mg | ORAL_TABLET | Freq: Once | ORAL | Status: AC | PRN
  Administered 2020-01-23: 400 mg via ORAL
  Filled 2020-01-23: qty 1

## 2020-01-23 MED ORDER — FENTANYL CITRATE (PF) 100 MCG/2ML IJ SOLN
25.0000 ug | Freq: Once | INTRAMUSCULAR | Status: AC
  Administered 2020-01-23: 25 ug via INTRAVENOUS
  Filled 2020-01-23: qty 2

## 2020-01-23 NOTE — ED Notes (Signed)
Martie Lee (daughter in law) [phone] here to pick up patient,

## 2020-01-23 NOTE — ED Provider Notes (Signed)
[facility] EMERGENCY DEPARTMENT Provider Note   CSN: [id number] Arrival date & time: 01/23/20  0524     History Chief Complaint  Patient presents with  . Fever    Dominique Evans is a 55 y.o. female.  The history is provided by the patient and medical records. No language interpreter was used.  Fever Temp source:  Subjective Severity:  Mild Duration:  1 day Timing:  Constant Progression:  Waxing and waning Chronicity:  New Relieved by:  Nothing Worsened by:  Nothing Ineffective treatments:  None tried Associated symptoms: chest pain, chills, cough and myalgias   Associated symptoms: no confusion, no congestion, no diarrhea, no dysuria, no headaches, no nausea, no rash, no rhinorrhea, no somnolence, no sore throat and no vomiting        Past Medical History:  Diagnosis Date  . Allergic rhinitis   . Anemia   . Arthritis    spondylolisthesis- lumbar region  . Asthma    PFT 08/12/07: FEV1 2.97 (104%), FEV1% 84, TLC 4.89 (90%), DLCO 84%, +BD  . Blood transfusion    hx of 2002   . Chronic constipation   . Chronic pain 09/24/2018  . Chronic rhinitis 10/31/2007   Qualifier: Diagnosis of  By: Craige Cotta MD, Vineet    . 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 Diabetic neuropathy (HCC) 09/22/2019  . Esophageal dysmotility 03/01/2015  . Esophageal dysphagia 06/10/2019  . GERD (gastroesophageal reflux disease)   . Head injury due to trauma    age 78, went under car while sledding, had memory loss at that time  . 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 History of stress test    exercise stress test, ? where it was done,  about 6 yrs. ago,  results- negative   . HNP (herniated nucleus pulposus), lumbar 11/30/2012  . 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 Hx of hiatal hernia   . Hydatidiform mole   . Hyperlipidemia   . Hypertension associated with diabetes (HCC) 10/04/2007   Qualifier: Diagnosis of  By: Craige Cotta MD, Vineet    .  Lateral ventral hernia 03/01/2015  . Low back pain 09/24/2018  . Major depressive disorder with single episode 09/24/2018  . Mild neurocognitive disorder, unclear etiology 10/25/2019  . 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 Obesity   . Obstructive sleep apnea 10/31/2007   Qualifier: Diagnosis of  By: Craige Cotta MD, Vineet    . Panic attacks   . Peripheral neuropathy    bilateral feet  . PONV (postoperative nausea and vomiting)   . Psoriasis   . Seasonal allergies   . Status post dilation of esophageal narrowing   . Type 2 diabetes mellitus with foot ulcer (HCC) 10/04/2007   Qualifier: Diagnosis of  By: Craige Cotta MD, Vineet    . Unspecified essential hypertension   . Visual disturbance     Patient Active Problem List   Diagnosis Date Noted  . Mild neurocognitive disorder, unclear etiology 10/25/2019  . Pain due to onychomycosis of toenails of both feet 09/22/2019  . Diabetic neuropathy (HCC) 09/22/2019  . Pre-ulcerative corn or callous 09/22/2019  . Esophageal dysphagia 06/10/2019  . Intractable nausea and vomiting 06/09/2019  . Cellulitis of left foot 04/27/2019  . Hyperlipidemia 02/16/2019  . Chronic pain 09/24/2018  . Incisional hernia 09/24/2018  . Low back pain 09/24/2018  . Major depressive disorder with single  episode 09/24/2018  . 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  . Esophageal dysmotility 03/01/2015  . Lateral ventral hernia 03/01/2015  . S/P lumbar spinal fusion 05/23/2014  . Edema 06/28/2013  . 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 HNP (herniated nucleus pulposus), lumbar 11/30/2012  . Thoracic aortic aneurysm 10/23/2010  . Constipation, chronic 03/23/2010  . Obesity, unspecified 10/31/2007  . Obstructive sleep apnea 10/31/2007  . Chronic rhinitis 10/31/2007  . Cough variant asthma 10/31/2007  . GERD (gastroesophageal reflux disease) 10/31/2007  . Type 2 diabetes mellitus with foot ulcer (HCC) 10/04/2007  . Hypertension associated with  diabetes (HCC) 10/04/2007  . 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/04/2007    Past Surgical History:  Procedure Laterality Date  . BIOPSY  06/11/2019   Procedure: BIOPSY;  Surgeon: Charna Elizabeth, MD;  Location: [facility] ENDOSCOPY;  Service: Endoscopy;;  . BOTOX INJECTION N/A 06/07/2015   Procedure: BOTOX INJECTION;  Surgeon: Louis Meckel, MD;  Location: [facility] ENDOSCOPY;  Service: Endoscopy;  Laterality: N/A;  . COLONOSCOPY WITH PROPOFOL N/A 06/07/2015   Procedure: COLONOSCOPY WITH PROPOFOL;  Surgeon: Louis Meckel, MD;  Location: [facility] ENDOSCOPY;  Service: Endoscopy;  Laterality: N/A;  . ELBOW FRACTURE SURGERY     left  . ESOPHAGEAL MANOMETRY N/A 04/08/2015   Procedure: ESOPHAGEAL MANOMETRY (EM);  Surgeon: Louis Meckel, MD;  Location: [facility] ENDOSCOPY;  Service: Endoscopy;  Laterality: N/A;  . ESOPHAGOGASTRODUODENOSCOPY (EGD) WITH PROPOFOL N/A 06/07/2015   Procedure: ESOPHAGOGASTRODUODENOSCOPY (EGD) WITH PROPOFOL;  Surgeon: Louis Meckel, MD;  Location: [facility] ENDOSCOPY;  Service: Endoscopy;  Laterality: N/A;  . ESOPHAGOGASTRODUODENOSCOPY (EGD) WITH PROPOFOL Left 06/11/2019   Procedure: ESOPHAGOGASTRODUODENOSCOPY (EGD) WITH PROPOFOL;  Surgeon: Charna Elizabeth, MD;  Location: [facility] ENDOSCOPY;  Service: Endoscopy;  Laterality: Left;  Marland Kitchen GASTRIC ROUX-EN-Y  10/05/2011   Procedure: LAPAROSCOPIC ROUX-EN-Y GASTRIC;  Surgeon: Mariella Saa, MD;  Location: [facility] ORS;  Service: General;  Laterality: N/A;  UPPER ENDOSCOPY  . lap band removal     . LAPAROSCOPIC GASTRIC BANDING  02/2006   w/ truncal vagotomy  . LUMBAR LAMINECTOMY/DECOMPRESSION MICRODISCECTOMY Left 11/30/2012   Procedure: MICRO LUMBAR DECOMPRESSION L4-5 ON THE LEFT;  Surgeon: Javier Docker, MD;  Location: [facility] ORS;  Service: Orthopedics;  Laterality: Left;  MICRO LUMBAR DECOMPRESSION L4-5 ON THE LEFT  . LUMBAR LAMINECTOMY/DECOMPRESSION MICRODISCECTOMY N/A 03/30/2013   Procedure: REDO MICRO LUMBAR DECOMPRESSION L4-5;  Surgeon: Javier Docker,  MD;  Location: [facility] ORS;  Service: Orthopedics;  Laterality: N/A;  . MAXIMUM ACCESS (MAS)POSTERIOR LUMBAR INTERBODY FUSION (PLIF) 1 LEVEL N/A 05/23/2014   Procedure: FOR MAXIMUM ACCESS (MAS) POSTERIOR LUMBAR INTERBODY FUSION (PLIF) Lumbar Four-Five;  Surgeon: Tia Alert, MD;  Location: [facility] NEURO ORS;  Service: Neurosurgery;  Laterality: N/A;  FOR MAXIMUM ACCESS (MAS) POSTERIOR LUMBAR INTERBODY FUSION (PLIF) Lumbar Four-Five  . NEPHRECTOMY  2002   left  . PATENT DUCTUS ARTERIOUS REPAIR    . SHOULDER ARTHROSCOPY     right shoulder     OB History   No obstetric history on file.     Family History  Problem Relation Age of Onset  . Hypertension Father   . Skin cancer Father   . Heart disease Father        before age 71  . Heart attack Father   . Hyperlipidemia Father   . Diabetes Mother   . Hypertension Mother   . Skin cancer Mother   . Breast cancer Mother        Melanoma  .  Heart disease Mother   . Hyperlipidemia Mother   . Lung cancer Mother   . Brain cancer Mother   . Hypertension Sister   . Heart disease Sister        before age 56  . Heart attack Sister   . Hyperlipidemia Sister   . Breast cancer Maternal Aunt   . Colon cancer Neg Hx   . Colon polyps Neg Hx   . Esophageal cancer Neg Hx   . Gallbladder disease Neg Hx   . Stomach cancer Neg Hx     Social History   Tobacco Use  . Smoking status: Former Smoker    Packs/day: 2.00    Types: Cigarettes    Quit date: 10/19/1982    Years since quitting: 37.2  . Smokeless tobacco: Never Used  Substance Use Topics  . Alcohol use: No    Alcohol/week: 0.0 standard drinks  . Drug use: No    Home Medications Prior to Admission medications   Medication Sig Start Date End Date Taking? Authorizing Provider  albuterol (PROVENTIL HFA;VENTOLIN HFA) 108 (90 BASE) MCG/ACT inhaler Inhale 1-2 puffs into the lungs every 4 (four) hours as needed for wheezing or shortness of breath.    [provider]  amitriptyline (ELAVIL)  25 MG tablet Take 50 mg by mouth at bedtime.     [provider]  Calcium Carb-Cholecalciferol (CALCIUM 500+D3) 500-400 MG-UNIT TABS Take 1 tablet by mouth daily.    [provider]  dexlansoprazole (DEXILANT) 60 MG capsule Take 60 mg by mouth 2 (two) times daily.    [provider]  ferrous sulfate 325 (65 FE) MG tablet Take 325 mg by mouth daily with breakfast.    [provider]  fexofenadine (ALLEGRA) 180 MG tablet Take 180 mg by mouth daily.    [provider]  Fluticasone-Salmeterol (ADVAIR DISKUS) 250-50 MCG/DOSE AEPB Inhale 1 puff into the lungs 2 (two) times daily.     [provider]  furosemide (LASIX) 20 MG tablet Take 20 mg by mouth every morning.     [provider]  lansoprazole (PREVACID) 30 MG capsule TAKE 1 CAPSULE BY MOUTH TWICE A DAY BEFORE A MEAL WAIT AT LEAST 30 MINUTES BEFORE EATING 09/04/19   Napoleon FormNandigam, Kavitha V, MD  losartan (COZAAR) 50 MG tablet Take 50 mg by mouth every morning.  09/21/11   [provider]  montelukast (SINGULAIR) 10 MG tablet Take 10 mg by mouth at bedtime.     [provider]  Multiple Vitamins-Minerals (MULTIVITAMIN PO) Take 1 tablet by mouth daily.     [provider]  oxyCODONE-acetaminophen (PERCOCET/ROXICET) 5-325 MG tablet Take 1 tablet by mouth See admin instructions. Take 1 tablet by mouth four to five times a day    [provider]  polyethylene glycol (MIRALAX) powder Take 17 g by mouth daily.     [provider]  potassium citrate (UROCIT-K) 10 MEQ (1080 MG) SR tablet Take 10 mEq by mouth 3 (three) times daily with meals.     [provider]  pregabalin (LYRICA) 150 MG capsule Take 150 mg by mouth 2 (two) times daily.    [provider]  PRISTIQ 50 MG 24 hr tablet Take 50 mg by mouth daily. 04/20/15   [provider]  rosuvastatin (CRESTOR) 10 MG tablet Take 5 mg by mouth every morning.     [provider]   Secukinumab, 300 MG Dose, (COSENTYX, 300 MG DOSE,) 150 MG/ML SOSY Inject 300  mg into the skin every 30 (thirty) days.    [provider]  verapamil (COVERA HS) 240 MG (CO) 24 hr tablet Take 240 mg by mouth every morning.     [provider]  pantoprazole (PROTONIX) 20 MG tablet Take 1 tablet (20 mg total) by mouth daily. 06/09/19 06/09/19  Albrizze, Kaitlyn E, PA-C    Allergies    Penicillins, Sulfonamide derivatives, Clindamycin/lincomycin, and Ciprofloxacin  Review of Systems   Review of Systems  Constitutional: Positive for chills, fatigue and fever. Negative for diaphoresis.  HENT: Negative for congestion, mouth sores, nosebleeds, rhinorrhea and sore throat.   Eyes: Negative for visual disturbance.  Respiratory: Positive for cough. Negative for chest tightness, shortness of breath, wheezing and stridor.   Cardiovascular: Positive for chest pain. Negative for palpitations and leg swelling.  Gastrointestinal: Negative for abdominal pain, constipation, diarrhea, nausea and vomiting.  Genitourinary: Positive for decreased urine volume. Negative for dysuria and flank pain.  Musculoskeletal: Positive for myalgias. Negative for back pain, neck pain and neck stiffness.  Skin: Negative for rash and wound.  Neurological: Negative for dizziness, seizures, facial asymmetry, weakness, light-headedness, numbness and headaches.  Psychiatric/Behavioral: Negative for agitation and confusion.  All other systems reviewed and are negative.   Physical Exam Updated Vital Signs BP (!) 110/97 (BP Location: Right Arm)   Pulse (!) 101   Temp (!) 100.4 F (38 C) (Oral)   Resp 20   LMP 12/21/2015   SpO2 100%   Physical Exam Vitals and nursing note reviewed.  Constitutional:      General: She is not in acute distress.    Appearance: She is well-developed. She is not ill-appearing, toxic-appearing or diaphoretic.  HENT:     Head: No laceration.      Right Ear: External ear normal.       Left Ear: External ear normal.     Nose: Nasal tenderness present. No nasal deformity, septal deviation, laceration, congestion or rhinorrhea.     Right Nostril: No foreign body, epistaxis or septal hematoma.     Left Nostril: No foreign body, epistaxis or septal hematoma.     Mouth/Throat:     Mouth: Mucous membranes are moist.     Pharynx: No oropharyngeal exudate or posterior oropharyngeal erythema.  Eyes:     Extraocular Movements: Extraocular movements intact.     Conjunctiva/sclera: Conjunctivae normal.     Pupils: Pupils are equal, round, and reactive to light.  Cardiovascular:     Rate and Rhythm: Normal rate.     Pulses: Normal pulses.     Heart sounds: No murmur. No gallop.   Pulmonary:     Effort: No respiratory distress.     Breath sounds: No stridor. No wheezing, rhonchi or rales.  Chest:     Chest wall: No tenderness.  Abdominal:     General: There is no distension.     Tenderness: There is no abdominal tenderness. There is no right CVA tenderness, left CVA tenderness, guarding or rebound.  Musculoskeletal:        General: No tenderness.     Cervical back: Normal range of motion and neck supple.     Right lower leg: No edema.     Left lower leg: No edema.  Skin:    General: Skin is warm.     Coloration: Skin is not pale.     Findings: No erythema or rash.  Neurological:     General: No focal deficit present.  Mental Status: She is alert and oriented to person, place, and time.     Motor: No abnormal muscle tone.     Coordination: Coordination normal.     Deep Tendon Reflexes: Reflexes are normal and symmetric.  Psychiatric:        Mood and Affect: Mood normal.     ED Results / Procedures / Treatments   Labs (all labs ordered are listed, but only abnormal results are displayed) Labs Reviewed  CBC WITH DIFFERENTIAL/PLATELET - Abnormal; Notable for the following components:      Result Value   Hemoglobin 11.8 (*)    All other components within  normal limits  COMPREHENSIVE METABOLIC PANEL - Abnormal; Notable for the following components:   Glucose, Bld 105 (*)    Creatinine, Ser 1.12 (*)    GFR calc non Af Amer 55 (*)    All other components within normal limits  SARS CORONAVIRUS 2 (TAT 6-24 HRS)  CULTURE, BLOOD (ROUTINE X 2)  CULTURE, BLOOD (ROUTINE X 2)  URINE CULTURE  LACTIC ACID, PLASMA  LACTIC ACID, PLASMA  LIPASE, BLOOD  CK  URINALYSIS, ROUTINE W REFLEX MICROSCOPIC  POC SARS CORONAVIRUS 2 AG -  ED  TROPONIN I (HIGH SENSITIVITY)  TROPONIN I (HIGH SENSITIVITY)    EKG EKG Interpretation  Date/Time:  Tuesday January 23 2020 11:52:38 EDT Ventricular Rate:  83 PR Interval:    QRS Duration: 104 QT Interval:  386 QTC Calculation: 454 R Axis:   -5 Text Interpretation: Sinus rhythm When compared to prior, no signifiant changes seen. No STEMI Confirmed by Antony Blackbird [phone]) on 01/23/2020 1:34:03 PM   Radiology DG Chest Portable 1 View  Result Date: 01/23/2020 CLINICAL DATA:  Productive cough. EXAM: PORTABLE CHEST 1 VIEW COMPARISON:  Chest x-ray 06/09/2019.  CT chest 06/09/2019. FINDINGS: Right-sided aortic arch again noted. Heart size and pulmonary vascularity normal. Lungs are clear. No pleural effusion or pneumothorax. Surgical clips noted over the lower chest and upper abdomen. IMPRESSION: No acute cardiopulmonary disease. Electronically Signed   By: Marcello Moores  Register   On: 01/23/2020 11:18    Procedures Procedures (including critical care time)  Medications Ordered in ED Medications  sodium chloride 0.9 % bolus 1,000 mL (has no administration in time range)  ibuprofen (ADVIL) tablet 400 mg (400 mg Oral Given 01/23/20 0820)    ED Course  I have reviewed the triage vital signs and the nursing notes.  Pertinent labs & imaging results that were available during my care of the patient were reviewed by me and considered in my medical decision making (see chart for details).    MDM Rules/Calculators/A&P                       Tannia Contino is a 54 y.o. female with a past medical history significant for hypertension, diabetes, GERD, prior nephrectomy, thoracic aortic aneurysm, gastric bypass surgery, hyperlipidemia, mild neurocognitive disorder, depression, and obesity who presents with 2 separate concerns of hitting her nose last night on a cabinet with mild pain as well as a 1 day history of fatigue, malaise, diffuse myalgias and aches, fever, chills, productive cough, and decreased urination.  She reports no nausea, vomiting, constipation, or diarrhea.  She denies any known coronavirus contacts but that is her concern.  She reports no new headache, neck pain, neck stiffness.  She reports some mild pain in her nose that has improved.  She does report some chest tightness and chest discomfort with her cough but  reports it comes and goes.  Does not go to her abdomen and is not sharp and tearing.  Blood pressure not elevated on arrival.  She reports chronic occasional rash but no changes there recently.  She denies any tick exposures.  She denies other complaints.  On exam, patient has mild tenderness on the nose.  No nasal septal hematoma or deviation seen.  No tenderness in the sinuses.  Normal extraocular movements and pupil exam.  Symmetric smile with clear speech.  Normal sensation on the face.  Normal strength and sensation in extremities.  Normal finger-nose-finger testing bilaterally.  Neck is nontender.  Lungs were clear and chest was nontender.  Abdomen was nontender.  Legs nontender nonedematous.  Patient had some mild muscle tenderness diffusely.  Clinically suspect viral infection contributing to her symptoms.  Rapid Covid was negative so we will send a PCR test.  Will get labs and rehydrate patient.  We will get CK given the myalgias all over to look for rhabdo.  We will get a chest x-ray with the productive cough with a phlegm.  Will get EKG, chest x-ray, and labs to look for cardiac etiology of her chest  discomfort however I have low suspicion for an aortic problem with the nonelevated blood pressure and her lack of any sharp or tearing pains.  Anticipate reassessment after work-up.  Work-up returned and was overall reassuring.  Her chest x-ray did not show pneumonia.  No evidence of sepsis with normal lactic acid x2.  Troponin negative x2.  Normal CK.  Creatinine slightly elevated at 1.1, may be with dehydration.  Patient was reassessed and her heart rate had increased to around 120.  Patient says she is feeling dehydrated.  We will give 1 L of fluids and see if her heart rate improves.  Patient wanted to take one of her home Percocet to help with her chronic pains.  Given the otherwise reassuring work-up, this was felt to be reasonable.  Patient will take her own pain pill and will be given fluids.  Suspect a viral infection that may or may not be coronavirus.  The PCR test will likely not return tonight however if patient has improvement in her heart rate and she is feeling better, anticipate discharge home with PCP follow-up.  Patient is agreeable to this plan and care transferred to oncoming team while awaiting reassessment of heart rate after fluids.  Anticipate discharge home shortly.    Final Clinical Impression(s) / ED Diagnoses Final diagnoses:  Fever, unspecified fever cause  Myalgia  Tachycardia  Dehydration    Clinical Impression: 1. Fever, unspecified fever cause   2. Myalgia   3. Tachycardia   4. Dehydration     Disposition: Care transferred to oncoming team while awaiting reassessment of heart rate after fluids.  Anticipate discharge home shortly.   This note was prepared with assistance of Conservation officer, historic buildings. Occasional wrong-word or sound-a-like substitutions may have occurred due to the inherent limitations of voice recognition software.      Chonda Baney, Canary Brim, MD 01/23/20 [phone]

## 2020-01-23 NOTE — ED Provider Notes (Addendum)
Care of the patient assumed at signout.  On signout the patient is tachycardic, came in febrile, with coronavirus-like illness. Update: Patient continues to complain of headache, and in spite of initial ibuprofen and now Tylenol continues to have fever, is tachycardic.  Update: On repeat exam patient continues to complain of headache, and now when asked move her head, neck, she complains of pain in the back of her neck.  Though she has no other focal neurologic deficiencies, with consideration of fever, tachycardia, SIRS, possibly meningitis, patient will have LP.  On reviewing patient's MRI from 2 years ago, and discussing with our neurology colleagues, the patient does have a history of prior surgery, but indication for attempt in the ED is clear.  I discussed this with the patient at length, risks, benefits.  We also discussed the possible difficulty of obtaining a sample given her surgical history.  .Lumbar Puncture  Date/Time: 01/23/2020 10:45 PM Performed by: Gerhard Munch, MD Authorized by: Gerhard Munch, MD   Consent:    Consent obtained:  Verbal   Consent given by:  Patient   Risks discussed:  Headache, infection, nerve damage, pain and repeat procedure   Alternatives discussed:  No treatment Universal protocol:    Procedure explained and questions answered to patient or proxy's satisfaction: yes     Relevant documents present and verified: yes     Test results available and properly labeled: yes     Imaging studies available: yes     Required blood products, implants, devices, and special equipment available: yes     Immediately prior to procedure a time out was called: yes     Site/side marked: yes     Patient identity confirmed:  Verbally with patient Pre-procedure details:    Procedure purpose:  Diagnostic   Preparation: Patient was prepped and draped in usual sterile fashion   Sedation:    Sedation type:  Anxiolysis Anesthesia (see MAR for exact dosages):     Anesthesia method:  Local infiltration   Local anesthetic:  Lidocaine 1% w/o epi Procedure details:    Lumbar space:  L3-L4 interspace   Patient position:  Sitting   Needle gauge:  18   Needle type:  Diamond point   Needle length (in):  2.5   Ultrasound guidance: no     Number of attempts:  1 Post-procedure:    Puncture site:  Adhesive bandage applied   Patient tolerance of procedure:  Tolerated well, no immediate complications Comments:     There was substantial resistance throughout the procedure, though no complications.  This is consistent with the patient's prior imaging studies demonstrating postsurgical changes.  10:46 PM Patient tolerated lumbar puncture attempt without complication.  She remains febrile, tachycardic.  Empiric therapy for meningitis has been started.  With 2 - Covid tests, but with persistent concern for SIRS, the patient will require admission for further monitoring, management.  Patient may need IR consult for LP in the AM.    Gerhard Munch, MD 01/23/20 [id number]    Gerhard Munch, MD 01/23/20 2312

## 2020-01-23 NOTE — ED Triage Notes (Signed)
Pt reports that yesterday she hit her nose on a cabinet but she did not fall, today she has had generalized body aches and fevers, denies sick contacts.

## 2020-01-23 NOTE — H&P (Signed)
Dominique Evans YQM:[id number] DOB: Nov 20, 1965 DOA: 01/23/2020     PCP: Haywood Pao, MD   Outpatient Specialists:  [facility]  [place]   Patient arrived to ER on 01/23/20 at 0524  Patient coming from: home Lives   With family    Chief Complaint:   Chief Complaint  Patient presents with  . Fever    HPI: Dominique Evans is a 54 y.o. female with medical history significant of anemia, asthma, constipation, chronic pain, diabetes mellitus HLD developmental delay, depression, history of gastric bypass, sleep apnea, GERD, history of nephrectomy    Presented with generalized body aches fevers headache, productive cough decrease in urination malaise fatigue muscle aches no associated nausea vomiting no constipation no diarrhea She has had her nose yesterday and has been hurting her bed but has improved Reports mild shortness of breath a cough that comes and goes Patient reports she was cleaning the bathroom yesterday and when she stood up she brushed her nose against the cabinet it was not a very big blow but since then her face started to swell and became very painful Her right ear has started to swell as well  Patient has been using Humira for psoriasis She denies any exposure to Covid positive patients. She lives with her son who works from home and his wife who is a Marine scientist but has been vaccinated for Covid patient herself have not been vaccinated against Covid yet but planning on it   Infectious risk factors:  Reports fever, shortness of breath, dry cough, chest pain,  symptoms,     Body aches, severe fatigue     Has NOt been vaccinated against COVID     in house  PCR testing   NEGATIVE  Lab Results  Component Value Date   [place] 01/23/2020   [place] 06/09/2019   [place] NEGATIVE 04/27/2019    Regarding pertinent Chronic problems:     Hyperlipidemia -  on statins Crestor   HTN on Cozaar, verapamil         Asthma -well  controlled on  home inhalers     OSA -not on CPAP    While in ER: Initiated sepsis protocol LP attempted but could not obtain Started on broad-spectrum antibiotics Covid negative Noted to be tachycardic up to 120 given 1 L normal saline which has improved heart rate But patient had persistent fever and neck pain   Hospitalist was called for admission for SIRS  The following Work up has been ordered so far:  Orders Placed This Encounter  Procedures  . Lumbar Puncture  . SARS CORONAVIRUS 2 (TAT 6-24 HRS) Nasopharyngeal Nasopharyngeal Swab  . Blood culture (routine x 2)  . Urine culture  . Body fluid culture  . Gram stain  . DG Chest Portable 1 View  . CBC with Differential  . Comprehensive metabolic panel  . Lactic acid, plasma  . Lipase, blood  . CK  . Urinalysis, Routine w reflex microscopic  . Cell count + diff,  w/ cryst-synvl fld  . Glucose, Body Fluid Other  . Influenza panel by PCR (type A & B)  . Cardiac monitoring  . Lumbar puncture tray to bedside  . Consult to neurology  ALL PATIENTS BEING ADMITTED/HAVING PROCEDURES NEED COVID-19 SCREENING  . Consult to hospitalist  ALL PATIENTS BEING ADMITTED/HAVING PROCEDURES NEED COVID-19 SCREENING  . Droplet precaution  . POC SARS Coronavirus 2 Ag-ED - Nasal Swab (BD Veritor Kit)  . CBG monitoring, ED  . ED  EKG  . EKG 12-Lead     Following Medications were ordered in ER: Medications  aztreonam (AZACTAM) 2 g in sodium chloride 0.9 % 100 mL IVPB (2 g Intravenous New Bag/Given 01/23/20 2319)  vancomycin (VANCOREADY) IVPB 1750 mg/350 mL (has no administration in time range)  ibuprofen (ADVIL) tablet 400 mg (400 mg Oral Given 01/23/20 0820)  sodium chloride 0.9 % bolus 1,000 mL (0 mLs Intravenous Stopped 01/23/20 1841)  acetaminophen (TYLENOL) tablet 1,000 mg (1,000 mg Oral Given 01/23/20 1902)  fentaNYL (SUBLIMAZE) injection 12.5 mcg (12.5 mcg Intravenous Given 01/23/20 2147)  fentaNYL (SUBLIMAZE) injection 25 mcg (25 mcg Intravenous Given 01/23/20  2312)        Consult Orders  (From admission, onward)         Start     Ordered   01/23/20 2244  Consult to hospitalist  ALL PATIENTS BEING ADMITTED/HAVING PROCEDURES NEED COVID-19 SCREENING PAGED [facility]  Once    Comments: ALL PATIENTS BEING ADMITTED/HAVING PROCEDURES NEED COVID-19 SCREENING  Provider:  (Not yet assigned)  Question Answer Comment  Place call to: [facility]   Reason for Consult Admit      01/23/20 2243          Significant initial  Findings: Abnormal Labs Reviewed  CBC WITH DIFFERENTIAL/PLATELET - Abnormal; Notable for the following components:      Result Value   Hemoglobin 11.8 (*)    All other components within normal limits  COMPREHENSIVE METABOLIC PANEL - Abnormal; Notable for the following components:   Glucose, Bld 105 (*)    Creatinine, Ser 1.12 (*)    GFR calc non Af Amer 55 (*)    All other components within normal limits  CBG MONITORING, ED - Abnormal; Notable for the following components:   Glucose-Capillary 125 (*)    All other components within normal limits    Otherwise labs showing:     Recent Labs  Lab 01/23/20 1100  NA 138  K 4.0  CO2 26  GLUCOSE 105*  BUN 16  CREATININE 1.12*  CALCIUM 8.9    Cr  Up from baseline see below Lab Results  Component Value Date   CREATININE 1.12 (H) 01/23/2020   CREATININE 0.69 06/11/2019   CREATININE 0.83 06/10/2019    Recent Labs  Lab 01/23/20 1100  AST 25  ALT 16  ALKPHOS 92  BILITOT 0.6  PROT 7.1  ALBUMIN 3.8   Lab Results  Component Value Date   CALCIUM 8.9 01/23/2020      WBC      Component Value Date/Time   WBC 7.2 01/23/2020 1100   ANC    Component Value Date/Time   NEUTROABS 5.4 01/23/2020 1100      Plt: Lab Results  Component Value Date   PLT 174 01/23/2020     Lactic Acid, Venous    Component Value Date/Time   LATICACIDVEN 1.1 01/23/2020 1206   Procalcitonin   Ordered   COVID-19 Labs  No results for input(s): DDIMER, FERRITIN,  LDH, CRP in the last 72 hours.  Lab Results  Component Value Date   SARSCOV2NAA NEGATIVE 01/23/2020   SARSCOV2NAA NEGATIVE 06/09/2019   [place] NEGATIVE 04/27/2019     HG/HCT  Stable,     Component Value Date/Time   HGB 11.8 (L) 01/23/2020 1100   HCT 37.8 01/23/2020 1100    Recent Labs  Lab 01/23/20 1100  LIPASE 20      Troponin 5 Cardiac Panel (last 3 results) Recent Labs  01/23/20 1100  CKTOTAL 141       ECG: Ordered Personally reviewed by me showing: HR : 83 Rhythm:  NSR,    no evidence of ischemic changes QTC 454    DM  labs:  HbA1C: Recent Labs    04/27/19 2155  HGBA1C 5.7*     CBG (last 3)  Recent Labs    01/23/20 2144  GLUCAP 125*     UA   no evidence of UTI      Urine analysis:    Component Value Date/Time   COLORURINE YELLOW 01/23/2020 1206   APPEARANCEUR CLEAR 01/23/2020 1206   LABSPEC 1.008 01/23/2020 1206   PHURINE 7.0 01/23/2020 1206   GLUCOSEU NEGATIVE 01/23/2020 1206   HGBUR NEGATIVE 01/23/2020 1206   BILIRUBINUR NEGATIVE 01/23/2020 1206   [place] 01/23/2020 1206   PROTEINUR NEGATIVE 01/23/2020 1206   NITRITE NEGATIVE 01/23/2020 1206   LEUKOCYTESUR NEGATIVE 01/23/2020 1206    Ordered    CXR -  NON acute    ED Triage Vitals  Enc Vitals Group     BP 01/23/20 0528 130/87     Pulse Rate 01/23/20 0528 (!) 118     Resp 01/23/20 0528 16     Temp 01/23/20 0528 (!) 100.9 F (38.3 C)     Temp Source 01/23/20 0528 Oral     SpO2 01/23/20 0528 98 %     Weight 01/23/20 2200 198 lb (89.8 kg)     Height 01/23/20 2200 5' 5"  (1.651 m)     Head Circumference --      Peak Flow --      Pain Score 01/23/20 0541 9     Pain Loc --      Pain Edu? --      Excl. in [place]? --   TMAX(24)@       Latest  Blood pressure 140/85, pulse 82, temperature 100.2 F (37.9 C), temperature source Oral, resp. rate 14, height 5' 5"  (1.651 m), weight 89.8 kg, last menstrual period 12/21/2015, SpO2 96 %.     Review of Systems:     Pertinent positives include:   Fevers, chills, fatigue, non-productive cough  Constitutional:  No weight loss, night sweats weight loss  HEENT:  No headaches, Difficulty swallowing,Tooth/dental problems,Sore throat,  No sneezing, itching, ear ache, nasal congestion, post nasal drip,  Cardio-vascular:  No chest pain, Orthopnea, PND, anasarca, dizziness, palpitations.no Bilateral lower extremity swelling  GI:  No heartburn, indigestion, abdominal pain, nausea, vomiting, diarrhea, change in bowel habits, loss of appetite, melena, blood in stool, hematemesis Resp:  no shortness of breath at rest. No dyspnea on exertion, No excess mucus, no productive cough, No , No coughing up of blood.No change in color of mucus.No wheezing. Skin:  no rash or lesions. No jaundice GU:  no dysuria, change in color of urine, no urgency or frequency. No straining to urinate.  No flank pain.  Musculoskeletal:  No joint pain or no joint swelling. No decreased range of motion. No back pain.  Psych:  No change in mood or affect. No depression or anxiety. No memory loss.  Neuro: no localizing neurological complaints, no tingling, no weakness, no double vision, no gait abnormality, no slurred speech, no confusion  All systems reviewed and apart from [place] all are negative  Past Medical History:   Past Medical History:  Diagnosis Date  . Allergic rhinitis   . Anemia   . Arthritis    spondylolisthesis- lumbar region  . Asthma  PFT 08/12/07: FEV1 2.97 (104%), FEV1% 84, TLC 4.89 (90%), DLCO 84%, +BD  . Blood transfusion    hx of 2002   . Chronic constipation   . Chronic pain 09/24/2018  . Chronic rhinitis 10/31/2007   Qualifier: Diagnosis of  By: Halford Chessman MD, [place]    . 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 Diabetic neuropathy ([place]) 09/22/2019  . Esophageal dysmotility 03/01/2015  . Esophageal dysphagia 06/10/2019  . GERD (gastroesophageal reflux disease)   . Head  injury due to trauma    age 59, went under car while sledding, had memory loss at that time  . 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 History of stress test    exercise stress test, ? where it was done,  about 6 yrs. ago,  results- negative   . HNP (herniated nucleus pulposus), lumbar 11/30/2012  . 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 Hx of hiatal hernia   . Hydatidiform mole   . Hyperlipidemia   . Hypertension associated with diabetes ([place]) 10/04/2007   Qualifier: Diagnosis of  By: Halford Chessman MD, Vineet    . Lateral ventral hernia 03/01/2015  . Low back pain 09/24/2018  . Major depressive disorder with single episode 09/24/2018  . Mild neurocognitive disorder, unclear etiology 10/25/2019  . 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 Obesity   . Obstructive sleep apnea 10/31/2007   Qualifier: Diagnosis of  By: Halford Chessman MD, Vine[place]    . Panic attacks   . Peripheral neuropathy    bilateral feet  . PONV (postoperative nausea and vomiting)   . Psoriasis   . Seasonal allergies   . Status post dilation of esophageal narrowing   . Type 2 diabetes mellitus with foot ulcer ([place]) 10/04/2007   Qualifier: Diagnosis of  By: Halford Chessman MD, [place]    . Unspecified essential hypertension   . Visual disturbance       Past Surgical History:  Procedure Laterality Date  . BIOPSY  06/11/2019   Procedure: BIOPSY;  Surgeon: Juanita Craver, MD;  Location: [facility] ENDOSCOPY;  Service: Endoscopy;;  . BOTOX INJECTION N/A 06/07/2015   Procedure: BOTOX INJECTION;  Surgeon: Inda Castle, MD;  Location: [facility] ENDOSCOPY;  Service: Endoscopy;  Laterality: N/A;  . COLONOSCOPY WITH PROPOFOL N/A 06/07/2015   Procedure: COLONOSCOPY WITH PROPOFOL;  Surgeon: Inda Castle, MD;  Location: [facility] ENDOSCOPY;  Service: Endoscopy;  Laterality: N/A;  . ELBOW FRACTURE SURGERY     left  . ESOPHAGEAL MANOMETRY N/A 04/08/2015   Procedure: ESOPHAGEAL MANOMETRY (EM);  Surgeon: Inda Castle, MD;  Location: [facility] ENDOSCOPY;  Service:  Endoscopy;  Laterality: N/A;  . ESOPHAGOGASTRODUODENOSCOPY (EGD) WITH PROPOFOL N/A 06/07/2015   Procedure: ESOPHAGOGASTRODUODENOSCOPY (EGD) WITH PROPOFOL;  Surgeon: Inda Castle, MD;  Location: [facility] ENDOSCOPY;  Service: Endoscopy;  Laterality: N/A;  . ESOPHAGOGASTRODUODENOSCOPY (EGD) WITH PROPOFOL Left 06/11/2019   Procedure: ESOPHAGOGASTRODUODENOSCOPY (EGD) WITH PROPOFOL;  Surgeon: Juanita Craver, MD;  Location[facility] ENDOSCOPY;  Service: Endoscopy;  Laterality: Left;  Marland Kitchen GASTRIC ROUX-EN-Y  10/05/2011   Procedure: LAPAROSCOPIC ROUX-EN-Y GASTRIC;  Surgeon: Edward Jolly, MD;  Location: [facility] ORS;  Service: General;  Laterality: N/A;  UPPER ENDOSCOPY  . lap band removal     . LAPAROSCOPIC GASTRIC BANDING  02/2006   w/ truncal vagotomy  . LUMBAR LAMINECTOMY/DECOMPRESSION MICRODISCECTOMY Left 11/30/2012   Procedure: MICRO LUMBAR DECOMPRESSION L4-5 ON THE LEFT;  Surgeon: Dellis Filbert  Windy Kalata, MD;  Location: [facility] ORS;  Service: Orthopedics;  Laterality: Left;  MICRO LUMBAR DECOMPRESSION L4-5 ON THE LEFT  . LUMBAR LAMINECTOMY/DECOMPRESSION MICRODISCECTOMY N/A 03/30/2013   Procedure: REDO MICRO LUMBAR DECOMPRESSION L4-5;  Surgeon: Johnn Hai, MD;  Location: [facility] ORS;  Service: Orthopedics;  Laterality: N/A;  . MAXIMUM ACCESS (MAS)POSTERIOR LUMBAR INTERBODY FUSION (PLIF) 1 LEVEL N/A 05/23/2014   Procedure: FOR MAXIMUM ACCESS (MAS) POSTERIOR LUMBAR INTERBODY FUSION (PLIF) Lumbar Four-Five;  Surgeon: Eustace Moore, MD;  Location: [facility] NEURO ORS;  Service: Neurosurgery;  Laterality: N/A;  FOR MAXIMUM ACCESS (MAS) POSTERIOR LUMBAR INTERBODY FUSION (PLIF) Lumbar Four-Five  . NEPHRECTOMY  2002   left  . PATENT DUCTUS ARTERIOUS REPAIR    . SHOULDER ARTHROSCOPY     right shoulder    Social History:  Ambulatory  Independently      reports that she quit smoking about 37 years ago. Her smoking use included cigarettes. She smoked 2.00 packs per day. She has never used smokeless tobacco. She reports that she does not drink  alcohol or use drugs.  Family History:   Family History  Problem Relation Age of Onset  . Hypertension Father   . Skin cancer Father   . Heart disease Father        before age 56  . Heart attack Father   . Hyperlipidemia Father   . Diabetes Mother   . Hypertension Mother   . Skin cancer Mother   . Breast cancer Mother        Melanoma  . Heart disease Mother   . Hyperlipidemia Mother   . Lung cancer Mother   . Brain cancer Mother   . Hypertension Sister   . Heart disease Sister        before age 30  . Heart attack Sister   . Hyperlipidemia Sister   . Breast cancer Maternal Aunt   . Colon cancer Neg Hx   . Colon polyps Neg Hx   . Esophageal cancer Neg Hx   . Gallbladder disease Neg Hx   . Stomach cancer Neg Hx     Allergies: Allergies  Allergen Reactions  . Penicillins Anaphylaxis    Has patient had a PCN reaction causing immediate rash, facial/tongue/throat swelling, SOB or lightheadedness with hypotension: yes Has patient had a PCN reaction causing severe rash involving mucus membranes or skin necrosis: unknown Has patient had a PCN reaction that required hospitalization unknown Has patient had a PCN reaction occurring within the last 10 years: no If all of the above answers are "NO", then may proceed with Cephalosporin use.   . Sulfonamide Derivatives Anaphylaxis  . Clindamycin/Lincomycin Nausea And Vomiting  . Ciprofloxacin Hives     Prior to Admission medications   Medication Sig Start Date End Date Taking? Authorizing Provider  albuterol (PROVENTIL HFA;VENTOLIN HFA) 108 (90 BASE) MCG/ACT inhaler Inhale 1-2 puffs into the lungs every 4 (four) hours as needed for wheezing or shortness of breath.   Yes [provider]  amitriptyline (ELAVIL) 25 MG tablet Take 50 mg by mouth at bedtime.    Yes [provider]  Calcium Carb-Cholecalciferol (CALCIUM 500+D3) 500-400 MG-UNIT TABS Take 1 tablet by mouth daily.   Yes [provider]  Ferrous  Sulfate (IRON PO) Take 1 tablet by mouth daily. 1 tablet daily. [facility] OTC iron   Yes [provider]  fexofenadine (ALLEGRA) 180 MG tablet Take 180 mg by mouth daily.   Yes [provider]  Fluticasone-Salmeterol (ADVAIR  DISKUS) 250-50 MCG/DOSE AEPB Inhale 1 puff into the lungs 2 (two) times daily.    Yes [provider]  furosemide (LASIX) 20 MG tablet Take 20 mg by mouth every morning.    Yes [provider]  HUMIRA PEN 40 MG/0.4ML PNKT Inject 40 mg into the skin every 14 (fourteen) days. 01/05/20  Yes [provider]  lansoprazole (PREVACID) 30 MG capsule TAKE 1 CAPSULE BY MOUTH TWICE A DAY BEFORE A MEAL WAIT AT LEAST 30 MINUTES BEFORE EATING Patient taking differently: Take 30 mg by mouth in the morning and at bedtime.  09/04/19  Yes Nandigam, Venia Minks, MD  montelukast (SINGULAIR) 10 MG tablet Take 10 mg by mouth at bedtime.    Yes [provider]  Multiple Vitamins-Minerals (MULTIVITAMIN PO) Take 1 tablet by mouth daily.    Yes [provider]  oxyCODONE-acetaminophen (PERCOCET/ROXICET) 5-325 MG tablet Take 1 tablet by mouth 5 (five) times daily.    Yes [provider]  polyethylene glycol powder (MIRALAX) 17 GM/SCOOP powder Take 17 g by mouth daily.    Yes [provider]  potassium citrate (UROCIT-K) 10 MEQ (1080 MG) SR tablet Take 10 mEq by mouth 3 (three) times daily with meals.    Yes [provider]  pregabalin (LYRICA) 100 MG capsule Take 100 mg by mouth 2 (two) times daily. 01/11/20  Yes [provider]  PRISTIQ 50 MG 24 hr tablet Take 50 mg by mouth daily. 04/20/15  Yes [provider]  rosuvastatin (CRESTOR) 10 MG tablet Take 5 mg by mouth every morning.    Yes [provider]  verapamil (VERELAN PM) 240 MG 24 hr capsule Take 240 mg by mouth daily. 01/02/20  Yes [provider]  pantoprazole (PROTONIX) 20 MG tablet Take 1 tablet (20 mg total) by mouth daily. 06/09/19  06/09/19  Albrizze, Harley Hallmark, PA-C   Physical Exam: Blood pressure 140/85, pulse 82, temperature 100.2 F (37.9 C), temperature source Oral, resp. rate 14, height 5' 5"  (1.651 m), weight 89.8 kg, last menstrual period 12/21/2015, SpO2 96 %. 1. General:  in No  Acute distress   Chronically ill   -appearing 2. Psychological: Alert and  Oriented but easily forgetful 3. Head/ENT:    Dry Mucous Membranes                          Head Non traumatic, neck supple                          Poor Dentition 4. SKIN:  decreased Skin turgor,  Skin clean Dry significant swelling of the face and right ear noted bilaterally with some redness and warmth small area of redness on left foot noted, evidence of psoriasis noted 5. Heart: Regular rate and rhythm no  Murmur, no Rub or gallop 6. Lungs:  Clear to auscultation bilaterally, no wheezes or crackles   7. Abdomen: Soft,  non-tender, Non distended bowel sounds present 8. Lower extremities: no clubbing, cyanosis, no edema 9. Neurologically Grossly intact, moving all 4 extremities equally   10. MSK: Normal range of motion   All other LABS:     Recent Labs  Lab 01/23/20 1100  WBC 7.2  NEUTROABS 5.4  HGB 11.8*  HCT 37.8  MCV 90.4  PLT 174     Recent Labs  Lab 01/23/20 1100  NA 138  K 4.0  CL 101  CO2 26  GLUCOSE 105*  BUN 16  CREATININE 1.12*  CALCIUM 8.9     Recent Labs  Lab 01/23/20 1100  AST 25  ALT 16  ALKPHOS 92  BILITOT 0.6  PROT 7.1  ALBUMIN 3.8       Cultures:    Component Value Date/Time   SDES IN/OUT CATH URINE 04/27/2019 2158   SPECREQUEST NONE 04/27/2019 2158   CULT  04/27/2019 2158    NO GROWTH Performed at [facility] [address] [place], [place] [address]    REPTSTATUS 04/28/2019 FINAL 04/27/2019 2158     Radiological Exams on Admission: DG Chest Portable 1 View  Result Date: 01/23/2020 CLINICAL DATA:  Productive cough. EXAM: PORTABLE CHEST 1 VIEW COMPARISON:  Chest x-ray 06/09/2019.  CT  chest 06/09/2019. FINDINGS: Right-sided aortic arch again noted. Heart size and pulmonary vascularity normal. Lungs are clear. No pleural effusion or pneumothorax. Surgical clips noted over the lower chest and upper abdomen. IMPRESSION: No acute cardiopulmonary disease. Electronically Signed   By: Marcello Moores  Register   On: 01/23/2020 11:18    Chart has been reviewed    Assessment/Plan  54 y.o. female with medical history significant of anemia, asthma, constipation, chronic pain, diabetes mellitus HLD developmental delay, depression, history of gastric bypass, sleep apnea, GERD, history of nephrectomy     Admitted for SIRS possibly secondary to facial cellulitis versus meningitis versus viral illness  Present on Admission: . SIRS (systemic inflammatory response syndrome) (HCC) -  -SIRS criteria met with  tachycardia ,   fever.    With/without evidence of end organ damage such as  acute renal failure,   -Most likely source being: viral  Vs meningitis  - Obtain serial lactic acid and procalcitonin level.  - Initiate IV antibiotics   - await results of blood and urine culture  - Rehydrate   Questionable meningitis -doubt bacterial meningitis  But given headache febrile illness unable to rule out meningitis attempted LP in emergency department and was not able to proceed.  Order fluoroscopy guided LP in a.m. for now continue antibiotics. Meningitis is somewhat less likely on my exam there is no neck stiffness at this time early neck pain could be secondary to generalized facial swelling  Possible facial cellulitis-patient with significant facial swelling as well as right ear swelling And pain suspect facial cellulitis with patient who possible immunocompromised taking immune modifying medications Patient is penicillin allergic continue with Vanco and aztreonam for now  Tested initially negative for Covid given diffuse symptoms worrisome for viral illness and Have been only symptomatic for past  24 hours will repeat in the morning if other source of infection noted that could explain her symptoms would discontinue precautions    . Hyperlipidemia-  Chronic stable continue home medications  . Diabetic neuropathy ([place]) -stable continue home meds patient at this point denies that she is diabetic   . Mild neurocognitive disorder, unclear etiology -chronic stable   . Obstructive sleep apnea - states not using CPAP   . Hypertension associated with diabetes ([place]) -hold off on home medications allow permissive hypertension for tonight  Asthma -chronic continue home medications  Dm 2-  - Order Sensitive SSI   pt states she is no longer diabetic will check hgA1c  Other plan as per orders.  DVT prophylaxis:  SCD    Code Status:  FULL CODE as per patient  I had personally discussed CODE STATUS with patient    Family Communication:   Family not at  Bedside  Disposition Plan:     To home once workup is complete and patient is stable   Following barriers for discharge:                                 Pain controlled with PO medications                               Afebrile, white count improving able to transition to PO antibiotics                             Will need to be able to tolerate PO                                                        Consults called: None  Admission status:  ED Disposition    ED Disposition Condition [place]: [place] [[id number]]  Level of Care: Progressive [102]  Admit to Progressive based on following criteria: MULTISYSTEM THREATS such as stable sepsis, metabolic/electrolyte imbalance with or without encephalopathy that is responding to early treatment.  Covid Evaluation: Symptomatic Person Under Investigation (PUI)  Diagnosis: SIRS (systemic inflammatory response syndrome) ([place]) [213086]  Admitting Physician: Toy Baker [[id number]]  Attending Physician: Toy Baker [[id number]]       Obs       Level of care      SDU tele indefinitely please discontinue once patient no longer qualifies   Precautions: admitted as PUI   Airborne and Contact precautions  If Covid repeat  PCR is negative and or evidence of of other source of infection - please DC precautions     PPE: Used by the provider:   P100  eye Goggles,  Gloves  gown   Shiheem Corporan 01/24/2020, 2:04 AM    [facility]     after 2 AM please page floor coverage PA If 7AM-7PM, please contact the day team taking care of the patient using [email or web address]   Patient was evaluated in the context of the global COVID-19 pandemic, which necessitated consideration that the patient might be at risk for infection with the SARS-CoV-2 virus that causes COVID-19. Institutional protocols and algorithms that pertain to the evaluation of patients at risk for COVID-19 are in a state of rapid change based on information released by regulatory bodies including the CDC and federal and state organizations. These policies and algorithms were followed during the patient's care.

## 2020-01-24 ENCOUNTER — Other Ambulatory Visit: Payer: Self-pay

## 2020-01-24 ENCOUNTER — Observation Stay (HOSPITAL_COMMUNITY): Payer: No Typology Code available for payment source

## 2020-01-24 ENCOUNTER — Encounter (HOSPITAL_COMMUNITY): Payer: Self-pay | Admitting: Internal Medicine

## 2020-01-24 DIAGNOSIS — J45909 Unspecified asthma, uncomplicated: Secondary | ICD-10-CM | POA: Diagnosis present

## 2020-01-24 DIAGNOSIS — Z833 Family history of diabetes mellitus: Secondary | ICD-10-CM | POA: Diagnosis not present

## 2020-01-24 DIAGNOSIS — Z905 Acquired absence of kidney: Secondary | ICD-10-CM | POA: Diagnosis not present

## 2020-01-24 DIAGNOSIS — G3184 Mild cognitive impairment, so stated: Secondary | ICD-10-CM | POA: Diagnosis present

## 2020-01-24 DIAGNOSIS — M4310 Spondylolisthesis, site unspecified: Secondary | ICD-10-CM | POA: Diagnosis present

## 2020-01-24 DIAGNOSIS — E785 Hyperlipidemia, unspecified: Secondary | ICD-10-CM | POA: Diagnosis present

## 2020-01-24 DIAGNOSIS — R6510 Systemic inflammatory response syndrome (SIRS) of non-infectious origin without acute organ dysfunction: Secondary | ICD-10-CM | POA: Diagnosis present

## 2020-01-24 DIAGNOSIS — D849 Immunodeficiency, unspecified: Secondary | ICD-10-CM | POA: Diagnosis present

## 2020-01-24 DIAGNOSIS — L03211 Cellulitis of face: Secondary | ICD-10-CM | POA: Diagnosis present

## 2020-01-24 DIAGNOSIS — I152 Hypertension secondary to endocrine disorders: Secondary | ICD-10-CM | POA: Diagnosis present

## 2020-01-24 DIAGNOSIS — G8929 Other chronic pain: Secondary | ICD-10-CM | POA: Diagnosis present

## 2020-01-24 DIAGNOSIS — Z9884 Bariatric surgery status: Secondary | ICD-10-CM | POA: Diagnosis not present

## 2020-01-24 DIAGNOSIS — Z87442 Personal history of urinary calculi: Secondary | ICD-10-CM | POA: Diagnosis not present

## 2020-01-24 DIAGNOSIS — E860 Dehydration: Secondary | ICD-10-CM | POA: Diagnosis present

## 2020-01-24 DIAGNOSIS — Z20822 Contact with and (suspected) exposure to covid-19: Secondary | ICD-10-CM | POA: Diagnosis present

## 2020-01-24 DIAGNOSIS — K219 Gastro-esophageal reflux disease without esophagitis: Secondary | ICD-10-CM | POA: Diagnosis present

## 2020-01-24 DIAGNOSIS — F410 Panic disorder [episodic paroxysmal anxiety] without agoraphobia: Secondary | ICD-10-CM | POA: Diagnosis present

## 2020-01-24 DIAGNOSIS — R509 Fever, unspecified: Secondary | ICD-10-CM | POA: Diagnosis present

## 2020-01-24 DIAGNOSIS — E1140 Type 2 diabetes mellitus with diabetic neuropathy, unspecified: Secondary | ICD-10-CM | POA: Diagnosis present

## 2020-01-24 DIAGNOSIS — Z23 Encounter for immunization: Secondary | ICD-10-CM | POA: Diagnosis not present

## 2020-01-24 DIAGNOSIS — R000 Tachycardia, unspecified: Secondary | ICD-10-CM | POA: Diagnosis present

## 2020-01-24 DIAGNOSIS — G4733 Obstructive sleep apnea (adult) (pediatric): Secondary | ICD-10-CM | POA: Diagnosis present

## 2020-01-24 DIAGNOSIS — L409 Psoriasis, unspecified: Secondary | ICD-10-CM | POA: Diagnosis present

## 2020-01-24 DIAGNOSIS — E1169 Type 2 diabetes mellitus with other specified complication: Secondary | ICD-10-CM | POA: Diagnosis present

## 2020-01-24 DIAGNOSIS — Z83438 Family history of other disorder of lipoprotein metabolism and other lipidemia: Secondary | ICD-10-CM | POA: Diagnosis not present

## 2020-01-24 DIAGNOSIS — E119 Type 2 diabetes mellitus without complications: Secondary | ICD-10-CM

## 2020-01-24 LAB — RESPIRATORY PANEL BY PCR

## 2020-01-24 LAB — URINE CULTURE: Culture: <10000 — AB

## 2020-01-24 LAB — D-DIMER, QUANTITATIVE: D-Dimer, Quant: 0.67 ug/mL-FEU — ABNORMAL HIGH (ref 0.00–0.50)

## 2020-01-24 LAB — CBC
HCT: 38.5 % (ref 36.0–46.0)
Hemoglobin: 12.1 g/dL (ref 12.0–15.0)
MCH: 28.3 pg (ref 26.0–34.0)
MCHC: 31.4 g/dL (ref 30.0–36.0)
MCV: 90.2 fL (ref 80.0–100.0)
Platelets: 160 10*3/uL (ref 150–400)
RBC: 4.27 MIL/uL (ref 3.87–5.11)
RDW: 12.9 % (ref 11.5–15.5)
WBC: 7.3 10*3/uL (ref 4.0–10.5)
nRBC: 0 % (ref 0.0–0.2)

## 2020-01-24 LAB — CBG MONITORING, ED
Glucose-Capillary: 97 mg/dL (ref 70–99)
Glucose-Capillary: 102 mg/dL — ABNORMAL HIGH (ref 70–99)
Glucose-Capillary: 81 mg/dL (ref 70–99)
Glucose-Capillary: 89 mg/dL (ref 70–99)

## 2020-01-24 LAB — PROCALCITONIN: Procalcitonin: <0.1 ng/mL

## 2020-01-24 LAB — GLUCOSE, CAPILLARY
Glucose-Capillary: 99 mg/dL (ref 70–99)
Glucose-Capillary: 79 mg/dL (ref 70–99)
Glucose-Capillary: 137 mg/dL — ABNORMAL HIGH (ref 70–99)

## 2020-01-24 LAB — INFLUENZA PANEL BY PCR (TYPE A & B)
Influenza A By PCR: NEGATIVE
Influenza B By PCR: NEGATIVE

## 2020-01-24 LAB — CREATININE, URINE, RANDOM: Creatinine, Urine: 53.48 mg/dL

## 2020-01-24 LAB — MAGNESIUM: Magnesium: 2 mg/dL (ref 1.7–2.4)

## 2020-01-24 LAB — C-REACTIVE PROTEIN: CRP: 7.8 mg/dL — ABNORMAL HIGH (ref <1.0)

## 2020-01-24 LAB — GLUCOSE, CSF: Glucose, CSF: 64 mg/dL (ref 40–70)

## 2020-01-24 LAB — PHOSPHORUS: Phosphorus: 2.8 mg/dL (ref 2.5–4.6)

## 2020-01-24 LAB — LACTATE DEHYDROGENASE: LDH: 185 U/L (ref 98–192)

## 2020-01-24 LAB — SODIUM, URINE, RANDOM: Sodium, Ur: 160 mmol/L

## 2020-01-24 LAB — PROTEIN, CSF: Total  Protein, CSF: 27 mg/dL (ref 15–45)

## 2020-01-24 LAB — TSH: TSH: 0.954 u[IU]/mL (ref 0.350–4.500)

## 2020-01-24 MED ORDER — SODIUM CHLORIDE 0.9 % IV SOLN: INTRAVENOUS | Status: DC

## 2020-01-24 MED ORDER — ONDANSETRON HCL 4 MG/2ML IJ SOLN: 4.0000 mg | Freq: Four times a day (QID) | INTRAMUSCULAR | Status: DC | PRN

## 2020-01-24 MED ORDER — ACETAMINOPHEN 325 MG PO TABS: 650.0000 mg | ORAL_TABLET | Freq: Four times a day (QID) | ORAL | Status: DC | PRN

## 2020-01-24 MED ORDER — SODIUM CHLORIDE 0.9 % IV SOLN
1.0000 g | Freq: Three times a day (TID) | INTRAVENOUS | Status: DC
  Filled 2020-01-24 (×3): qty 1

## 2020-01-24 MED ORDER — LIDOCAINE HCL (PF) 1 % IJ SOLN
5.0000 mL | Freq: Once | INTRAMUSCULAR | Status: AC
  Administered 2020-01-24: 10:00:00 5 mL via INTRADERMAL

## 2020-01-24 MED ORDER — ONDANSETRON HCL 4 MG PO TABS: 4.0000 mg | ORAL_TABLET | Freq: Four times a day (QID) | ORAL | Status: DC | PRN

## 2020-01-24 MED ORDER — AMITRIPTYLINE HCL 50 MG PO TABS
50.0000 mg | ORAL_TABLET | Freq: Every day | ORAL | Status: DC
  Administered 2020-01-24 – 2020-01-25 (×2): 50 mg via ORAL
  Filled 2020-01-24 (×2): qty 1

## 2020-01-24 MED ORDER — DIPHENHYDRAMINE HCL 25 MG PO CAPS
25.0000 mg | ORAL_CAPSULE | Freq: Four times a day (QID) | ORAL | Status: DC | PRN
  Administered 2020-01-24: 25 mg via ORAL
  Filled 2020-01-24: qty 1

## 2020-01-24 MED ORDER — IOHEXOL 300 MG/ML  SOLN
75.0000 mL | Freq: Once | INTRAMUSCULAR | Status: AC | PRN
  Administered 2020-01-24: 75 mL via INTRAVENOUS

## 2020-01-24 MED ORDER — MONTELUKAST SODIUM 10 MG PO TABS
10.0000 mg | ORAL_TABLET | Freq: Every day | ORAL | Status: DC
  Administered 2020-01-24 – 2020-01-25 (×2): 10 mg via ORAL
  Filled 2020-01-24 (×3): qty 1

## 2020-01-24 MED ORDER — INSULIN ASPART 100 UNIT/ML ~~LOC~~ SOLN: 0.0000 [IU] | SUBCUTANEOUS | Status: DC

## 2020-01-24 MED ORDER — ALBUTEROL SULFATE HFA 108 (90 BASE) MCG/ACT IN AERS
1.0000 | INHALATION_SPRAY | RESPIRATORY_TRACT | Status: DC | PRN
  Filled 2020-01-24: qty 6.7

## 2020-01-24 MED ORDER — VENLAFAXINE HCL ER 75 MG PO CP24
75.0000 mg | ORAL_CAPSULE | Freq: Every day | ORAL | Status: DC
  Administered 2020-01-25: 75 mg via ORAL
  Filled 2020-01-24 (×2): qty 1

## 2020-01-24 MED ORDER — ACETAMINOPHEN 650 MG RE SUPP: 650.0000 mg | Freq: Four times a day (QID) | RECTAL | Status: DC | PRN

## 2020-01-24 MED ORDER — PNEUMOCOCCAL VAC POLYVALENT 25 MCG/0.5ML IJ INJ
0.5000 mL | INJECTION | INTRAMUSCULAR | Status: AC
  Administered 2020-01-25: 0.5 mL via INTRAMUSCULAR

## 2020-01-24 MED ORDER — ROSUVASTATIN CALCIUM 5 MG PO TABS
5.0000 mg | ORAL_TABLET | Freq: Every morning | ORAL | Status: DC
  Administered 2020-01-25: 5 mg via ORAL
  Filled 2020-01-24: qty 1

## 2020-01-24 MED ORDER — MOMETASONE FURO-FORMOTEROL FUM 200-5 MCG/ACT IN AERO
2.0000 | INHALATION_SPRAY | Freq: Two times a day (BID) | RESPIRATORY_TRACT | Status: DC
  Administered 2020-01-24 – 2020-01-25 (×3): 2 via RESPIRATORY_TRACT
  Filled 2020-01-24: qty 8.8

## 2020-01-24 MED ORDER — VANCOMYCIN HCL IN DEXTROSE 1-5 GM/200ML-% IV SOLN
1000.0000 mg | INTRAVENOUS | Status: DC
  Administered 2020-01-24: 1000 mg via INTRAVENOUS
  Filled 2020-01-24 (×2): qty 200

## 2020-01-24 MED ORDER — HYDROCODONE-ACETAMINOPHEN 5-325 MG PO TABS: 1.0000 | ORAL_TABLET | ORAL | Status: DC | PRN

## 2020-01-24 MED ORDER — PREGABALIN 100 MG PO CAPS
100.0000 mg | ORAL_CAPSULE | Freq: Two times a day (BID) | ORAL | Status: DC
  Administered 2020-01-24 – 2020-01-25 (×3): 100 mg via ORAL
  Filled 2020-01-24 (×3): qty 1

## 2020-01-24 MED ORDER — OXYCODONE-ACETAMINOPHEN 5-325 MG PO TABS
1.0000 | ORAL_TABLET | Freq: Every day | ORAL | Status: DC
  Administered 2020-01-24 – 2020-01-26 (×9): 1 via ORAL
  Filled 2020-01-24 (×9): qty 1

## 2020-01-24 MED ORDER — ALBUTEROL SULFATE (2.5 MG/3ML) 0.083% IN NEBU: 2.5000 mg | INHALATION_SOLUTION | Freq: Four times a day (QID) | RESPIRATORY_TRACT | Status: DC | PRN

## 2020-01-24 NOTE — ED Notes (Signed)
Pt awakens easily without acute changes.  Does report her HA is returning.  Pt has questions about her POC and admission and home meds at this time.  MD paged and update given, will round when able.  Pt notified.

## 2020-01-24 NOTE — Progress Notes (Signed)
Pharmacy Antibiotic Note  Dominique Evans is a 54 y.o. female admitted on 01/23/2020 with sepsis.  Pharmacy has been consulted for Vancomycin and Aztreonam dosing. Noted pt with anaphylaxis to PCN and no evidence of cephalosporins given in the past.  Aztreonam 2gm and Vancomycin 1750mg  IV given in ED  Plan: Aztreonam 2gm IV q8h Vancomycin 1000mg  IV Q 24 hrs. Goal AUC 400-550. Expected AUC: 476 SCr used: 1.12, Vd coeff 0.5 Will f/u renal function, micro data, and pt's clinical condition Vanc levels prn   Height: 5\' 5"  (165.1 cm) Weight: 89.8 kg (198 lb) IBW/kg (Calculated) : 57  Temp (24hrs), Avg:100.4 F (38 C), Min:99.9 F (37.7 C), Max:100.9 F (38.3 C)  Recent Labs  Lab 01/23/20 1100 01/23/20 1206  WBC 7.2  --   CREATININE 1.12*  --   LATICACIDVEN 0.9 1.1    Estimated Creatinine Clearance: 62.8 mL/min (A) (by C-G formula based on SCr of 1.12 mg/dL (H)).    Allergies  Allergen Reactions  . Penicillins Anaphylaxis    Has patient had a PCN reaction causing immediate rash, facial/tongue/throat swelling, SOB or lightheadedness with hypotension: yes Has patient had a PCN reaction causing severe rash involving mucus membranes or skin necrosis: unknown Has patient had a PCN reaction that required hospitalization unknown Has patient had a PCN reaction occurring within the last 10 years: no If all of the above answers are "NO", then may proceed with Cephalosporin use.   . Sulfonamide Derivatives Anaphylaxis  . Clindamycin/Lincomycin Nausea And Vomiting  . Ciprofloxacin Hives    Antimicrobials this admission: 4/6 Aztreonam >>  4/7 Vanc >>   Microbiology results: 4/6 BCx:  4/6 UCx:    Thank you for allowing pharmacy to be a part of this patient's care.  6/7, PharmD, BCPS Please see amion for complete clinical pharmacist phone list 01/24/2020 12:46 AM

## 2020-01-24 NOTE — ED Notes (Signed)
Pt resting comfortably, updated on current lab results.

## 2020-01-24 NOTE — Procedures (Signed)
The L4-5 interspace was accessed without difficulty. 10cc of clear CSF was removed without difficulty and with grossly normal opening pressure.  No immediate complications noted.

## 2020-01-24 NOTE — ED Notes (Signed)
Pt. Asked about food, RN notified

## 2020-01-24 NOTE — Progress Notes (Signed)
[facility] Progress Note  Patient: Dominique Evans    TKP:[id number]  DOA: 01/23/2020     Date of Service: the patient was seen and examined on 01/24/2020  Chief Complaint  Patient presents with  . Fever   Brief hospital course: Past medical history of anemia, asthma, type II DM, developmental delay, depression, gastric bypass, sleep apnea, history of right nephrectomy.  Patient presented with complaints of fever and headache as well as swelling of the face and right ear.  Found to have facial cellulitis and currently on antibiotics. Currently further plan is continue antibiotics and follow-up on further work-up..  Assessment and Plan: 1.  Concern for sepsis POA secondary to facial cellulitis Met SIRS criteria with tachycardia and fever. No endorgan damage. Continue with IV antibiotics. Follow-up on blood cultures. There was also concern for meningitis which currently has ruled out. LP also negative for pleocytosis. Discontinue aztreonam.  2.  Hyperlipidemia Continue home regimen  3.  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 Continue sliding scale insulin.  Advance diet.  4.  Essential hypertension Continue home medication for now.  5.  Chronic neurocognitive disorder Stable.  Monitor.   Diet: Regular diet DVT Prophylaxis: Subcutaneous Lovenox   Advance goals of care discussion: Full code  Family Communication: no family was present at bedside, at the time of interview.  Attempt to reach the family via phone call was not successful.  Left voicemail.  Disposition:  Pt is from home, admitted with facial cellulitis, still has ongoing cellulitis with active symptoms without any improvement despite being on antibiotics, which precludes a safe discharge. Discharge to home, when medically stable.  Subjective: Reports worsening of her swelling as well as increased itching on right ear.  No nausea no vomiting.  Physical  Exam: General:  alert oriented to time, place, and person.  Appear in mild distress, affect appropriate Eyes: PERRL ENT: Oral Mucosa Clear, moist  Neck: no JVD,  Cardiovascular: S1 and S2 Present, no Murmur,  Respiratory: good respiratory effort, Bilateral Air entry equal and Decreased, no Crackles, no wheezes Abdomen: Bowel Sound present, Soft and no tenderness,  Skin: Redness involving bilateral Maller area and nose as well as left eye swelling with redness and warmth, right ear swelling with redness and warmth, no other rash Extremities: no Pedal edema, no calf tenderness Neurologic: without any new focal findings Gait not checked due to patient safety concerns  Vitals:   01/24/20 1200 01/24/20 1300 01/24/20 1400 01/24/20 1500  BP: (!) 119/54 112/67 106/65 (!) 96/51  Pulse: 74 82  78  Resp: 16 14 12 13   Temp:      TempSrc:      SpO2: 98% 99%  100%  Weight:      Height:        Intake/Output Summary (Last 24 hours) at 01/24/2020 1842 Last data filed at 01/24/2020 1356 Gross per 24 hour  Intake 1555.54 ml  Output --  Net 1555.54 ml   Filed Weights   01/23/20 2200  Weight: 89.8 kg    Data Reviewed: I have personally reviewed and interpreted daily labs, tele strips, imagings as discussed above. I reviewed all nursing notes, pharmacy notes, vitals, pertinent old records I have discussed plan of care as described above with RN and patient/family.  CBC: Recent Labs  Lab 01/23/20 1100 01/24/20 0637  WBC 7.2 7.3  NEUTROABS 5.4  --   HGB 11.8* 12.1  HCT 37.8 38.5  MCV 90.4 90.2  PLT 174 619   Basic Metabolic Panel: Recent Labs  Lab 01/23/20 1100 01/24/20 0637  NA 138  --   K 4.0  --   CL 101  --   CO2 26  --   GLUCOSE 105*  --   BUN 16  --   CREATININE 1.12*  --   CALCIUM 8.9  --   MG  --  2.0  PHOS  --  2.8    Studies: CT MAXILLOFACIAL W CONTRAST  Result Date: 01/24/2020 CLINICAL DATA:  Initial evaluation for acute facial swelling and pain, fever, cough.  Recent trauma. Is EXAM: CT MAXILLOFACIAL WITH CONTRAST TECHNIQUE: Multidetector CT imaging of the maxillofacial structures was performed with intravenous contrast. Multiplanar CT image reconstructions were also generated. CONTRAST:  12m OMNIPAQUE IOHEXOL 300 MG/ML  SOLN COMPARISON:  None available. FINDINGS: Osseous: No acute osseous abnormality about the face. Zygomatic arches intact. No acute maxillary fracture. Pterygoid plates intact. Nasal bones intact. Mild right-to-left nasal septal deviation without fracture. Mandible intact. Mandibular condyles normally situated. No acute abnormality about the dentition. Orbits: Globes and orbital soft tissues within normal limits. No evidence for pre or postseptal cellulitis. Bony orbits intact. Sinuses: Paranasal sinuses are clear. No evidence for acute sinusitis. No hemosinus. Soft tissues: No significant soft tissue swelling or inflammatory stranding seen about the face. Salivary glands within normal limits. No acute inflammatory changes seen about the dentition. No pathologically enlarged or abnormal adenopathy within the visualized face/neck. Visualized oral cavity and oropharynx demonstrates no acute finding. Limited intracranial: Unremarkable. IMPRESSION: Negative maxillofacial CT. No inflammatory changes to suggest acute infection identified. No evidence for acute traumatic injury. Electronically Signed   By: BJeannine BogaM.D.   On: 01/24/2020 02:41   DG FL GUIDED LUMBAR PUNCTURE  Result Date: 01/24/2020 CLINICAL DATA:  Sepsis. EXAM: DIAGNOSTIC LUMBAR PUNCTURE UNDER FLUOROSCOPIC GUIDANCE FLUOROSCOPY TIME:  Fluoroscopy Time:  12 seconds Radiation Exposure Index (if provided by the fluoroscopic device): 1.3 mGy Number of Acquired Spot Images: 0 PROCEDURE: Informed consent was obtained from the patient prior to the procedure, including potential complications of headache, allergy, and pain. With the patient prone, the lower back was prepped with Betadine.  1% Lidocaine was used for local anesthesia. Lumbar puncture was performed at the L4-5 level using a 22 gauge needle with return of clear CSF with a grossly normal opening pressure. The head of the fluoro table was raise to 8 in CSF flow. 10 ml of CSF were obtained for laboratory studies. The patient tolerated the procedure well and there were no apparent complications. IMPRESSION: 1. Technically successful fluoro guided lumbar puncture with 10 cc of clear CSF sent for requested testing. Electronically Signed   By: GZetta BillsM.D.   On: 01/24/2020 09:58    Scheduled Meds: . amitriptyline  50 mg Oral QHS  . insulin aspart  0-9 Units Subcutaneous Q4H  . mometasone-formoterol  2 puff Inhalation BID  . montelukast  10 mg Oral QHS  . oxyCODONE-acetaminophen  1 tablet Oral 5 X Daily  . [START ON 01/25/2020] pneumococcal 23 valent vaccine  0.5 mL Intramuscular Tomorrow-1000  . pregabalin  100 mg Oral BID  . rosuvastatin  5 mg Oral q morning - 10a  . venlafaxine XR  75 mg Oral Q breakfast   Continuous Infusions: . vancomycin     PRN Meds: acetaminophen **OR** acetaminophen, albuterol, diphenhydrAMINE, HYDROcodone-acetaminophen, ondansetron **OR** ondansetron (ZOFRAN) IV  Time spent: 35 minutes  Author: PBerle Mull MD [facility] 01/24/2020 6:42 PM  To reach On-call, see care teams to locate the attending and reach out to them via [email or web address]. If 7PM-7AM, please contact night-coverage If you still have difficulty reaching the attending provider, please page the [facility] (Director on Call) for [facility] on amion for assistance.

## 2020-01-24 NOTE — ED Notes (Signed)
Pt transported to Fluoro for LP.  Education provided.

## 2020-01-25 LAB — CBC WITH DIFFERENTIAL/PLATELET
Abs Immature Granulocytes: 0 10*3/uL (ref 0.00–0.07)
Basophils Absolute: 0 10*3/uL (ref 0.0–0.1)
Basophils Relative: 1 %
Eosinophils Absolute: 0.1 10*3/uL (ref 0.0–0.5)
Eosinophils Relative: 2 %
HCT: 38.3 % (ref 36.0–46.0)
Hemoglobin: 12.2 g/dL (ref 12.0–15.0)
Immature Granulocytes: 0 %
Lymphocytes Relative: 38 %
Lymphs Abs: 1.6 10*3/uL (ref 0.7–4.0)
MCH: 28.4 pg (ref 26.0–34.0)
MCHC: 31.9 g/dL (ref 30.0–36.0)
MCV: 89.3 fL (ref 80.0–100.0)
Monocytes Absolute: 0.5 10*3/uL (ref 0.1–1.0)
Monocytes Relative: 12 %
Neutro Abs: 2.1 10*3/uL (ref 1.7–7.7)
Neutrophils Relative %: 47 %
Platelets: 170 10*3/uL (ref 150–400)
RBC: 4.29 MIL/uL (ref 3.87–5.11)
RDW: 13 % (ref 11.5–15.5)
WBC: 4.4 10*3/uL (ref 4.0–10.5)
nRBC: 0 % (ref 0.0–0.2)

## 2020-01-25 LAB — COMPREHENSIVE METABOLIC PANEL
ALT: 20 U/L (ref 0–44)
AST: 27 U/L (ref 15–41)
Albumin: 3.3 g/dL — ABNORMAL LOW (ref 3.5–5.0)
Alkaline Phosphatase: 75 U/L (ref 38–126)
Anion gap: 9 (ref 5–15)
BUN: 10 mg/dL (ref 6–20)
CO2: 26 mmol/L (ref 22–32)
Calcium: 9.2 mg/dL (ref 8.9–10.3)
Chloride: 104 mmol/L (ref 98–111)
Creatinine, Ser: 0.85 mg/dL (ref 0.44–1.00)
GFR calc Af Amer: >60 mL/min (ref >60)
GFR calc non Af Amer: >60 mL/min (ref >60)
Glucose, Bld: 90 mg/dL (ref 70–99)
Potassium: 4 mmol/L (ref 3.5–5.1)
Sodium: 139 mmol/L (ref 135–145)
Total Bilirubin: 0.5 mg/dL (ref 0.3–1.2)
Total Protein: 7 g/dL (ref 6.5–8.1)

## 2020-01-25 LAB — GLUCOSE, CAPILLARY
Glucose-Capillary: 85 mg/dL (ref 70–99)
Glucose-Capillary: 117 mg/dL — ABNORMAL HIGH (ref 70–99)
Glucose-Capillary: 93 mg/dL (ref 70–99)
Glucose-Capillary: 77 mg/dL (ref 70–99)
Glucose-Capillary: 99 mg/dL (ref 70–99)
Glucose-Capillary: 83 mg/dL (ref 70–99)
Glucose-Capillary: 150 mg/dL — ABNORMAL HIGH (ref 70–99)
Glucose-Capillary: 137 mg/dL — ABNORMAL HIGH (ref 70–99)

## 2020-01-25 LAB — C-REACTIVE PROTEIN: CRP: 11 mg/dL — ABNORMAL HIGH (ref <1.0)

## 2020-01-25 LAB — HEMOGLOBIN A1C
Hgb A1c MFr Bld: 5.3 % (ref 4.8–5.6)
Mean Plasma Glucose: 105 mg/dL

## 2020-01-25 LAB — MAGNESIUM: Magnesium: 2.1 mg/dL (ref 1.7–2.4)

## 2020-01-25 MED ORDER — DOXYCYCLINE HYCLATE 100 MG PO TABS
100.0000 mg | ORAL_TABLET | Freq: Two times a day (BID) | ORAL | Status: DC
  Administered 2020-01-25 (×2): 100 mg via ORAL
  Filled 2020-01-25 (×2): qty 1

## 2020-01-25 NOTE — Plan of Care (Signed)

## 2020-01-25 NOTE — Progress Notes (Signed)
[facility] Progress Note  Patient: Dominique Evans    LKT:[id number]  DOA: 01/23/2020     Date of Service: the patient was seen and examined on 01/25/2020  Chief Complaint  Patient presents with  . Fever   Brief hospital course: Past medical history of anemia, asthma, type II DM, developmental delay, depression, gastric bypass, sleep apnea, history of right nephrectomy. Patient presented with complaints of fever and headache as well as swelling of the face and right ear. Found to have facial cellulitis and currently on antibiotics. Currently further plan is continue antibiotics and monitor for improvement on oral.  Assessment and Plan: 1.  Concern for sepsis POA secondary to facial cellulitis Met SIRS criteria with tachycardia and fever. No endorgan damage. Treated with IV antibiotics. Now on oral doxycycline.  Due to patient's multiple allergies currently I do have limited options to cover strep. Follow-up on blood cultures. So far no growth There was also concern for meningitis which currently has ruled out. LP also negative for pleocytosis. Discontinue aztreonam.  2.  Hyperlipidemia Continue home regimen  3.  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 Continue sliding scale insulin.  Advance diet.  4.  Essential hypertension Continue home medication for now.  5.  Chronic neurocognitive disorder Stable.  Monitor.   Diet: Regular diet DVT Prophylaxis: Subcutaneous Lovenox   Advance goals of care discussion: Full code  Family Communication: no family was present at bedside, at the time of interview.  Consult on the phone. Likely discharge tomorrow.  Disposition:  Pt is from home, admitted with facial cellulitis, still has ongoing cellulitis with active symptoms without any improvement despite being on antibiotics, which precludes a safe discharge. Discharge to home, tomorrow  Subjective: Swelling improving.  No  further itching.  No nausea no vomiting.  No fever no chills.  Physical Exam: General:  alert oriented to time, place, and person.  Appear in mild distress, affect appropriate Eyes: PERRL ENT: Oral Mucosa Clear, moist  Neck: no JVD,  Cardiovascular: S1 and S2 Present, no Murmur,  Respiratory: good respiratory effort, Bilateral Air entry equal and Decreased, no Crackles, no wheezes Abdomen: Bowel Sound present, Soft and no tenderness,  Skin: Improving in redness involving bilateral Maller area and nose as well as left eye swelling with redness and warmth, resolution of right ear swelling with redness and warmth, no other rash Extremities: no Pedal edema, no calf tenderness Neurologic: without any new focal findings Gait not checked due to patient safety concerns  Vitals:   01/24/20 2221 01/25/20 0717 01/25/20 0726 01/25/20 1540  BP: 121/74 110/78  118/83  Pulse: 77 93  72  Resp: 18     Temp: 98.4 F (36.9 C) 98 F (36.7 C)  98.5 F (36.9 C)  TempSrc: Oral     SpO2: 98% 100% 99% 100%  Weight:      Height:        Intake/Output Summary (Last 24 hours) at 01/25/2020 1824 Last data filed at 01/25/2020 0400 Gross per 24 hour  Intake 200.06 ml  Output --  Net 200.06 ml   Filed Weights   01/23/20 2200  Weight: 89.8 kg    Data Reviewed: I have personally reviewed and interpreted daily labs, tele strips, imagings as discussed above. I reviewed all nursing notes, pharmacy notes, vitals, pertinent old records I have discussed plan of care as described above with RN and patient/family.  CBC: Recent Labs  Lab 01/23/20 1100 01/24/20 0637 01/25/20  0220  WBC 7.2 7.3 4.4  NEUTROABS 5.4  --  2.1  HGB 11.8* 12.1 12.2  HCT 37.8 38.5 38.3  MCV 90.4 90.2 89.3  PLT 174 160 458   Basic Metabolic Panel: Recent Labs  Lab 01/23/20 1100 01/24/20 0637 01/25/20 0220  NA 138  --  139  K 4.0  --  4.0  CL 101  --  104  CO2 26  --  26  GLUCOSE 105*  --  90  BUN 16  --  10  CREATININE  1.12*  --  0.85  CALCIUM 8.9  --  9.2  MG  --  2.0 2.1  PHOS  --  2.8  --     Studies: No results found.  Scheduled Meds: . amitriptyline  50 mg Oral QHS  . doxycycline  100 mg Oral Q12H  . insulin aspart  0-9 Units Subcutaneous Q4H  . mometasone-formoterol  2 puff Inhalation BID  . montelukast  10 mg Oral QHS  . oxyCODONE-acetaminophen  1 tablet Oral 5 X Daily  . pregabalin  100 mg Oral BID  . rosuvastatin  5 mg Oral q morning - 10a  . venlafaxine XR  75 mg Oral Q breakfast   Continuous Infusions:  PRN Meds: acetaminophen **OR** acetaminophen, albuterol, diphenhydrAMINE, HYDROcodone-acetaminophen, ondansetron **OR** ondansetron (ZOFRAN) IV  Time spent: 35 minutes  Author: Berle Mull, MD [facility] 01/25/2020 6:24 PM  To reach On-call, see care teams to locate the attending and reach out to them via [email or web address]. If 7PM-7AM, please contact night-coverage If you still have difficulty reaching the attending provider, please page the [facility] (Director on Call) for [facility] on amion for assistance.

## 2020-01-26 LAB — CSF CELL COUNT WITH DIFFERENTIAL
RBC Count, CSF: 2 /mm3 — ABNORMAL HIGH
Tube #: 3
WBC, CSF: 0 /mm3 (ref 0–5)

## 2020-01-26 LAB — GLUCOSE, CAPILLARY
Glucose-Capillary: 80 mg/dL (ref 70–99)
Glucose-Capillary: 121 mg/dL — ABNORMAL HIGH (ref 70–99)

## 2020-01-26 LAB — C-REACTIVE PROTEIN: CRP: 5.8 mg/dL — ABNORMAL HIGH (ref <1.0)

## 2020-01-26 LAB — VDRL, CSF: VDRL Quant, CSF: NONREACTIVE

## 2020-01-26 MED ORDER — DOXYCYCLINE HYCLATE 100 MG PO TABS: 100.0000 mg | ORAL_TABLET | Freq: Two times a day (BID) | ORAL | 0 refills | Status: AC

## 2020-01-26 MED FILL — DOXYCYCLINE HYCLATE 100 MG: 100 | 5 days supply | Qty: 10 | Fill #0

## 2020-01-26 NOTE — Plan of Care (Signed)
Patient ready for discharge. Will continue PO Doxycycline at home for the next 7 days.

## 2020-01-27 LAB — CSF CULTURE W GRAM STAIN
Culture: NO GROWTH
Gram Stain: NONE SEEN

## 2020-01-28 LAB — CULTURE, BLOOD (ROUTINE X 2)
Culture: NO GROWTH
Culture: NO GROWTH
Special Requests: ADEQUATE
Special Requests: ADEQUATE

## 2020-01-30 ENCOUNTER — Ambulatory Visit: Payer: No Typology Code available for payment source | Admitting: Licensed Clinical Social Worker

## 2020-01-31 NOTE — Discharge Summary (Signed)
[facility] Discharge Summary   Patient: Dominique Evans QHU:[id number]  PCP: Haywood Pao, MD  Date of admission: 01/23/2020   Date of discharge: 01/26/2020      Discharge Diagnoses:  Principal diagnosis Facial cellulitis   Active Problems:   Obstructive sleep apnea   Hypertension associated with diabetes ([place])   Hyperlipidemia   Status post bariatric surgery   Diabetic neuropathy (HCC)   Mild neurocognitive disorder, unclear etiology   DM (diabetes mellitus), type 2 ([place])   SIRS (systemic inflammatory response syndrome) ([place])   Facial cellulitis   Admitted From: home Disposition:  Home   Recommendations for Outpatient Follow-up:  1. PCP: follow up in 1 week 2. Follow up LABS/TEST:  none  Follow-up Information    [name], Fransico Him, MD. Schedule an appointment as soon as possible for a visit in 1 week(s).   Specialty: Internal Medicine Contact information: [address] [phone]          Diet recommendation: Cardiac diet  Activity: The patient is advised to gradually reintroduce usual activities, as tolerated  Discharge Condition: stable  Code Status: Full code   History of present illness: As per the H and P dictated on admission, "Dominique Evans is a 54 y.o. female with medical history significant of anemia, asthma, constipation, chronic pain, diabetes mellitus HLD developmental delay, depression, history of gastric bypass, sleep apnea, GERD, history of nephrectomy    Presented with generalized body aches fevers headache, productive cough decrease in urination malaise fatigue muscle aches no associated nausea vomiting no constipation no diarrhea She has had her nose yesterday and has been hurting her bed but has improved Reports mild shortness of breath a cough that comes and goes Patient reports she was cleaning the bathroom yesterday and when she stood up she brushed her nose against the cabinet it was not a very big blow  but since then her face started to swell and became very painful Her right ear has started to swell as well  Patient has been using Humira for psoriasis She denies any exposure to Covid positive patients. She lives with her son who works from home and his wife who is a Marine scientist but has been vaccinated for Covid patient herself have not been vaccinated against Covid yet but planning on it"  Hospital Course:  Summary of her active problems in the hospital is as following. 1.  Concern for sepsis POA secondary to facial cellulitis Met SIRS criteria with tachycardia and fever. No endorgan damage. Treated with IV antibiotics. Now on oral doxycycline.  Due to patient's multiple allergies currently I do not have limited options to cover strep. So far no growth There was also concern for meningitis which currently has ruled out. LP also negative for pleocytosis. Discontinue aztreonam.  2.  Hyperlipidemia Continue home regimen  3.  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 Continue sliding scale insulin.  Advance diet.  4.  Essential hypertension Continue home medication for now.  5.  Chronic neurocognitive disorder Stable.  Monitor.  Patient was ambulatory without any assistance. On the day of the discharge the patient's vitals were stable, and no other acute medical condition were reported by patient. the patient was felt safe to be discharge at Home with no therapy needed on discharge.  Consultants: none Procedures: LP  Discharge Exam: General: Appear in no distress, no Rash; Oral Mucosa Clear, moist. Cardiovascular: S1 and S2 Present, no Murmur, Respiratory: normal respiratory effort,  Bilateral Air entry present and no Crackles, no wheezes Abdomen: Bowel Sound present, Soft and no tenderness, no hernia Extremities: no Pedal edema, no calf tenderness Neurology: alert and oriented to time, place, and person affect  appropriate.  Filed Weights   01/23/20 2200  Weight: 89.8 kg   Vitals:   01/25/20 2058 01/26/20 0815  BP:  119/85  Pulse: 78 73  Resp: 18 18  Temp:  98.7 F (37.1 C)  SpO2: 100%     DISCHARGE MEDICATION: Allergies as of 01/26/2020      Reactions   Penicillins Anaphylaxis   Has patient had a PCN reaction causing immediate rash, facial/tongue/throat swelling, SOB or lightheadedness with hypotension: yes Has patient had a PCN reaction causing severe rash involving mucus membranes or skin necrosis: unknown Has patient had a PCN reaction that required hospitalization unknown Has patient had a PCN reaction occurring within the last 10 years: no If all of the above answers are "NO", then may proceed with Cephalosporin use.   Sulfonamide Derivatives Anaphylaxis   Clindamycin/lincomycin Nausea And Vomiting   Ciprofloxacin Hives      Medication List    TAKE these medications   Advair Diskus 250-50 MCG/DOSE Aepb Generic drug: Fluticasone-Salmeterol Inhale 1 puff into the lungs 2 (two) times daily.   albuterol 108 (90 Base) MCG/ACT inhaler Commonly known as: VENTOLIN HFA Inhale 1-2 puffs into the lungs every 4 (four) hours as needed for wheezing or shortness of breath.   amitriptyline 25 MG tablet Commonly known as: ELAVIL Take 50 mg by mouth at bedtime.   Calcium 500+D3 500-400 MG-UNIT Tabs Generic drug: Calcium Carb-Cholecalciferol Take 1 tablet by mouth daily.   doxycycline 100 MG tablet Commonly known as: VIBRA-TABS Take 1 tablet (100 mg total) by mouth 2 (two) times daily for 5 days.   fexofenadine 180 MG tablet Commonly known as: ALLEGRA Take 180 mg by mouth daily.   furosemide 20 MG tablet Commonly known as: LASIX Take 20 mg by mouth every morning.   Humira Pen 40 MG/0.4ML Pnkt Generic drug: Adalimumab Inject 40 mg into the skin every 14 (fourteen) days.   IRON PO Take 1 tablet by mouth daily. 1 tablet daily. [facility] OTC iron   lansoprazole 30 MG  capsule Commonly known as: PREVACID TAKE 1 CAPSULE BY MOUTH TWICE A DAY BEFORE A MEAL WAIT AT LEAST 30 MINUTES BEFORE EATING What changed: See the new instructions.   MiraLax 17 GM/SCOOP powder Generic drug: polyethylene glycol powder Take 17 g by mouth daily.   montelukast 10 MG tablet Commonly known as: SINGULAIR Take 10 mg by mouth at bedtime.   MULTIVITAMIN PO Take 1 tablet by mouth daily.   oxyCODONE-acetaminophen 5-325 MG tablet Commonly known as: PERCOCET/ROXICET Take 1 tablet by mouth 5 (five) times daily.   potassium citrate 10 MEQ (1080 MG) SR tablet Commonly known as: UROCIT-K Take 10 mEq by mouth 3 (three) times daily with meals.   pregabalin 100 MG capsule Commonly known as: LYRICA Take 100 mg by mouth 2 (two) times daily.   Pristiq 50 MG 24 hr tablet Generic drug: desvenlafaxine Take 50 mg by mouth daily.   rosuvastatin 10 MG tablet Commonly known as: CRESTOR Take 5 mg by mouth every morning.   verapamil 240 MG 24 hr capsule Commonly known as: VERELAN PM Take 240 mg by mouth daily.      Allergies  Allergen Reactions  . Penicillins Anaphylaxis    Has patient had a PCN reaction causing immediate rash, facial/tongue/throat  swelling, SOB or lightheadedness with hypotension: yes Has patient had a PCN reaction causing severe rash involving mucus membranes or skin necrosis: unknown Has patient had a PCN reaction that required hospitalization unknown Has patient had a PCN reaction occurring within the last 10 years: no If all of the above answers are "NO", then may proceed with Cephalosporin use.   . Sulfonamide Derivatives Anaphylaxis  . Clindamycin/Lincomycin Nausea And Vomiting  . Ciprofloxacin Hives   Discharge Instructions    Diet - low sodium heart healthy   Complete by: As directed    Increase activity slowly   Complete by: As directed       The results of significant diagnostics from this hospitalization (including imaging, microbiology,  ancillary and laboratory) are listed below for reference.    Significant Diagnostic Studies: CT MAXILLOFACIAL W CONTRAST  Result Date: 01/24/2020 CLINICAL DATA:  Initial evaluation for acute facial swelling and pain, fever, cough. Recent trauma. Is EXAM: CT MAXILLOFACIAL WITH CONTRAST TECHNIQUE: Multidetector CT imaging of the maxillofacial structures was performed with intravenous contrast. Multiplanar CT image reconstructions were also generated. CONTRAST:  53m OMNIPAQUE IOHEXOL 300 MG/ML  SOLN COMPARISON:  None available. FINDINGS: Osseous: No acute osseous abnormality about the face. Zygomatic arches intact. No acute maxillary fracture. Pterygoid plates intact. Nasal bones intact. Mild right-to-left nasal septal deviation without fracture. Mandible intact. Mandibular condyles normally situated. No acute abnormality about the dentition. Orbits: Globes and orbital soft tissues within normal limits. No evidence for pre or postseptal cellulitis. Bony orbits intact. Sinuses: Paranasal sinuses are clear. No evidence for acute sinusitis. No hemosinus. Soft tissues: No significant soft tissue swelling or inflammatory stranding seen about the face. Salivary glands within normal limits. No acute inflammatory changes seen about the dentition. No pathologically enlarged or abnormal adenopathy within the visualized face/neck. Visualized oral cavity and oropharynx demonstrates no acute finding. Limited intracranial: Unremarkable. IMPRESSION: Negative maxillofacial CT. No inflammatory changes to suggest acute infection identified. No evidence for acute traumatic injury. Electronically Signed   By: BJeannine BogaM.D.   On: 01/24/2020 02:41   DG Chest Portable 1 View  Result Date: 01/23/2020 CLINICAL DATA:  Productive cough. EXAM: PORTABLE CHEST 1 VIEW COMPARISON:  Chest x-ray 06/09/2019.  CT chest 06/09/2019. FINDINGS: Right-sided aortic arch again noted. Heart size and pulmonary vascularity normal. Lungs are  clear. No pleural effusion or pneumothorax. Surgical clips noted over the lower chest and upper abdomen. IMPRESSION: No acute cardiopulmonary disease. Electronically Signed   By: TMarcello Moores Register   On: 01/23/2020 11:18   DG FL GUIDED LUMBAR PUNCTURE  Result Date: 01/24/2020 CLINICAL DATA:  Sepsis. EXAM: DIAGNOSTIC LUMBAR PUNCTURE UNDER FLUOROSCOPIC GUIDANCE FLUOROSCOPY TIME:  Fluoroscopy Time:  12 seconds Radiation Exposure Index (if provided by the fluoroscopic device): 1.3 mGy Number of Acquired Spot Images: 0 PROCEDURE: Informed consent was obtained from the patient prior to the procedure, including potential complications of headache, allergy, and pain. With the patient prone, the lower back was prepped with Betadine. 1% Lidocaine was used for local anesthesia. Lumbar puncture was performed at the L4-5 level using a 22 gauge needle with return of clear CSF with a grossly normal opening pressure. The head of the fluoro table was raise to 8 in CSF flow. 10 ml of CSF were obtained for laboratory studies. The patient tolerated the procedure well and there were no apparent complications. IMPRESSION: 1. Technically successful fluoro guided lumbar puncture with 10 cc of clear CSF sent for requested testing. Electronically Signed   By: GCay Schillings  Wile M.D.   On: 01/24/2020 09:58    Microbiology: Recent Results (from the past 240 hour(s))  SARS CORONAVIRUS 2 (TAT 6-24 HRS) Nasopharyngeal Nasopharyngeal Swab     Status: None   Collection Time: 01/23/20 11:00 AM   Specimen: Nasopharyngeal Swab  Result Value Ref Range Status   SARS Coronavirus 2 NEGATIVE NEGATIVE Final    Comment: (NOTE) SARS-CoV-2 target nucleic acids are NOT DETECTED. The SARS-CoV-2 RNA is generally detectable in upper and lower respiratory specimens during the acute phase of infection. Negative results do not preclude SARS-CoV-2 infection, do not rule out co-infections with other pathogens, and should not be used as the sole basis for  treatment or other patient management decisions. Negative results must be combined with clinical observations, patient history, and epidemiological information. The expected result is Negative. Fact Sheet for Patients: SugarRoll.be Fact Sheet for Healthcare Providers: https://www.woods-mathews.com/ This test is not yet approved or cleared by the [place] FDA and  has been authorized for detection and/or diagnosis of SARS-CoV-2 by FDA under an Emergency Use Authorization (EUA). This EUA will remain  in effect (meaning this test can be used) for the duration of the COVID-19 declaration under Section 56 4(b)(1) of the Act, 21 U.S.C. section 360bbb-3(b)(1), unless the authorization is terminated or revoked sooner. Performed at [facility], [place] [address] [place], [place] [address]   Blood culture (routine x 2)     Status: None   Collection Time: 01/23/20 11:00 AM   Specimen: BLOOD  Result Value Ref Range Status   Specimen Description BLOOD RIGHT ANTECUBITAL  Final   Special Requests   Final    BOTTLES DRAWN AEROBIC AND ANAEROBIC Blood Culture adequate volume   Culture   Final    NO GROWTH 5 DAYS Performed at [facility], [place] [address], [place], [place] [address]    Report Status 01/28/2020 FINAL  Final  Blood culture (routine x 2)     Status: None   Collection Time: 01/23/20 12:07 PM   Specimen: BLOOD RIGHT FOREARM  Result Value Ref Range Status   Specimen Description BLOOD RIGHT FOREARM  Final   Special Requests   Final    BOTTLES DRAWN AEROBIC AND ANAEROBIC Blood Culture adequate volume   Culture   Final    NO GROWTH 5 DAYS Performed at [facility], [place] [address] [place], [place] [address]    Report Status 01/28/2020 FINAL  Final  Urine culture     Status: Abnormal   Collection Time: 01/23/20 12:59 PM   Specimen: Urine, Random  Result Value Ref Range Status   Specimen Description URINE, RANDOM  Final    Special Requests NONE  Final   Culture (A)  Final    <10,000 COLONIES/mL INSIGNIFICANT GROWTH Performed at [facility], [place] [address] [place], [place] [address]    Report Status 01/24/2020 FINAL  Final  Respiratory Panel by PCR     Status: None   Collection Time: 01/24/20  3:15 AM   Specimen: Nasopharyngeal Swab; Respiratory  Result Value Ref Range Status   Adenovirus NOT DETECTED NOT DETECTED Final   Coronavirus 229E NOT DETECTED NOT DETECTED Final    Comment: (NOTE) The Coronavirus on the Respiratory Panel, DOES NOT test for the novel  Coronavirus (2019 nCoV)    Coronavirus HKU1 NOT DETECTED NOT DETECTED Final   Coronavirus NL63 NOT DETECTED NOT DETECTED Final   Coronavirus OC43 NOT DETECTED NOT DETECTED Final   Metapneumovirus  NOT DETECTED NOT DETECTED Final   Rhinovirus / Enterovirus NOT DETECTED NOT DETECTED Final   Influenza A NOT DETECTED NOT DETECTED Final   Influenza B NOT DETECTED NOT DETECTED Final   Parainfluenza Virus 1 NOT DETECTED NOT DETECTED Final   Parainfluenza Virus 2 NOT DETECTED NOT DETECTED Final   Parainfluenza Virus 3 NOT DETECTED NOT DETECTED Final   Parainfluenza Virus 4 NOT DETECTED NOT DETECTED Final   Respiratory Syncytial Virus NOT DETECTED NOT DETECTED Final   Bordetella pertussis NOT DETECTED NOT DETECTED Final   Chlamydophila pneumoniae NOT DETECTED NOT DETECTED Final   Mycoplasma pneumoniae NOT DETECTED NOT DETECTED Final    Comment: Performed at [facility], [place] [address], [place], [place] [address]  CSF culture     Status: None   Collection Time: 01/24/20  9:42 AM   Specimen: PATH Cytology CSF; Cerebrospinal Fluid  Result Value Ref Range Status   Specimen Description CSF  Final   Special Requests NONE  Final   Gram Stain NO WBC SEEN NO ORGANISMS SEEN CYTOSPIN SMEAR   Final   Culture   Final    NO GROWTH 3 DAYS Performed at [facility], [place] [address], [place], [place] [address]    Report Status 01/27/2020 FINAL   Final     Labs: CBC: Recent Labs  Lab 01/25/20 0220  WBC 4.4  NEUTROABS 2.1  HGB 12.2  HCT 38.3  MCV 89.3  PLT 233   Basic Metabolic Panel: Recent Labs  Lab 01/25/20 0220  NA 139  K 4.0  CL 104  CO2 26  GLUCOSE 90  BUN 10  CREATININE 0.85  CALCIUM 9.2  MG 2.1   Liver Function Tests: Recent Labs  Lab 01/25/20 0220  AST 27  ALT 20  ALKPHOS 75  BILITOT 0.5  PROT 7.0  ALBUMIN 3.3*   No results for input(s): LIPASE, AMYLASE in the last 168 hours. No results for input(s): AMMONIA in the last 168 hours. Cardiac Enzymes: No results for input(s): CKTOTAL, CKMB, CKMBINDEX, TROPONINI in the last 168 hours. BNP (last 3 results) No results for input(s): BNP in the last 8760 hours. CBG: Recent Labs  Lab 01/25/20 1542 01/25/20 1932 01/25/20 2322 01/26/20 0335 01/26/20 0809  GLUCAP 83 150* 137* 80 121*    Time spent: 35 minutes  Signed:  Berle Mull  [facility] 01/26/2020 8:22 AM

## 2020-02-15 ENCOUNTER — Encounter (HOSPITAL_COMMUNITY): Payer: Self-pay | Admitting: Emergency Medicine

## 2020-02-15 ENCOUNTER — Emergency Department (HOSPITAL_COMMUNITY): Payer: No Typology Code available for payment source

## 2020-02-15 ENCOUNTER — Inpatient Hospital Stay (HOSPITAL_COMMUNITY)
Admission: EM | Admit: 2020-02-15 | Discharge: 2020-02-17 | DRG: 155 | Disposition: A | Discharge status: Home / Self Care | Payer: No Typology Code available for payment source | Attending: Internal Medicine | Admitting: Internal Medicine

## 2020-02-15 ENCOUNTER — Other Ambulatory Visit: Payer: Self-pay

## 2020-02-15 DIAGNOSIS — L03211 Cellulitis of face: Secondary | ICD-10-CM | POA: Diagnosis present

## 2020-02-15 DIAGNOSIS — K219 Gastro-esophageal reflux disease without esophagitis: Secondary | ICD-10-CM | POA: Diagnosis present

## 2020-02-15 DIAGNOSIS — Z881 Allergy status to other antibiotic agents status: Secondary | ICD-10-CM

## 2020-02-15 DIAGNOSIS — H61001 Unspecified perichondritis of right external ear: Secondary | ICD-10-CM | POA: Diagnosis present

## 2020-02-15 DIAGNOSIS — G3184 Mild cognitive impairment, so stated: Secondary | ICD-10-CM | POA: Diagnosis present

## 2020-02-15 DIAGNOSIS — Z803 Family history of malignant neoplasm of breast: Secondary | ICD-10-CM

## 2020-02-15 DIAGNOSIS — T364X5A Adverse effect of tetracyclines, initial encounter: Secondary | ICD-10-CM | POA: Diagnosis present

## 2020-02-15 DIAGNOSIS — Z6833 Body mass index (BMI) 33.0-33.9, adult: Secondary | ICD-10-CM

## 2020-02-15 DIAGNOSIS — H6011 Cellulitis of right external ear: Secondary | ICD-10-CM | POA: Diagnosis not present

## 2020-02-15 DIAGNOSIS — Z981 Arthrodesis status: Secondary | ICD-10-CM

## 2020-02-15 DIAGNOSIS — E1169 Type 2 diabetes mellitus with other specified complication: Secondary | ICD-10-CM | POA: Diagnosis present

## 2020-02-15 DIAGNOSIS — Z79891 Long term (current) use of opiate analgesic: Secondary | ICD-10-CM

## 2020-02-15 DIAGNOSIS — F329 Major depressive disorder, single episode, unspecified: Secondary | ICD-10-CM | POA: Diagnosis present

## 2020-02-15 DIAGNOSIS — G4733 Obstructive sleep apnea (adult) (pediatric): Secondary | ICD-10-CM | POA: Diagnosis present

## 2020-02-15 DIAGNOSIS — E1159 Type 2 diabetes mellitus with other circulatory complications: Secondary | ICD-10-CM | POA: Diagnosis present

## 2020-02-15 DIAGNOSIS — Z20822 Contact with and (suspected) exposure to covid-19: Secondary | ICD-10-CM | POA: Diagnosis present

## 2020-02-15 DIAGNOSIS — G8929 Other chronic pain: Secondary | ICD-10-CM | POA: Diagnosis present

## 2020-02-15 DIAGNOSIS — E119 Type 2 diabetes mellitus without complications: Secondary | ICD-10-CM

## 2020-02-15 DIAGNOSIS — Z83438 Family history of other disorder of lipoprotein metabolism and other lipidemia: Secondary | ICD-10-CM

## 2020-02-15 DIAGNOSIS — Z880 Allergy status to penicillin: Secondary | ICD-10-CM

## 2020-02-15 DIAGNOSIS — E1140 Type 2 diabetes mellitus with diabetic neuropathy, unspecified: Secondary | ICD-10-CM | POA: Diagnosis present

## 2020-02-15 DIAGNOSIS — M948X9 Other specified disorders of cartilage, unspecified sites: Secondary | ICD-10-CM

## 2020-02-15 DIAGNOSIS — Z87442 Personal history of urinary calculi: Secondary | ICD-10-CM

## 2020-02-15 DIAGNOSIS — Z9884 Bariatric surgery status: Secondary | ICD-10-CM

## 2020-02-15 DIAGNOSIS — Z8249 Family history of ischemic heart disease and other diseases of the circulatory system: Secondary | ICD-10-CM

## 2020-02-15 DIAGNOSIS — L409 Psoriasis, unspecified: Secondary | ICD-10-CM | POA: Diagnosis present

## 2020-02-15 DIAGNOSIS — H539 Unspecified visual disturbance: Secondary | ICD-10-CM | POA: Diagnosis present

## 2020-02-15 DIAGNOSIS — Y929 Unspecified place or not applicable: Secondary | ICD-10-CM

## 2020-02-15 DIAGNOSIS — Z87891 Personal history of nicotine dependence: Secondary | ICD-10-CM

## 2020-02-15 DIAGNOSIS — Z808 Family history of malignant neoplasm of other organs or systems: Secondary | ICD-10-CM

## 2020-02-15 DIAGNOSIS — Z882 Allergy status to sulfonamides status: Secondary | ICD-10-CM

## 2020-02-15 DIAGNOSIS — Z7951 Long term (current) use of inhaled steroids: Secondary | ICD-10-CM

## 2020-02-15 DIAGNOSIS — F419 Anxiety disorder, unspecified: Secondary | ICD-10-CM | POA: Diagnosis present

## 2020-02-15 DIAGNOSIS — E669 Obesity, unspecified: Secondary | ICD-10-CM | POA: Diagnosis present

## 2020-02-15 DIAGNOSIS — Z833 Family history of diabetes mellitus: Secondary | ICD-10-CM

## 2020-02-15 DIAGNOSIS — E785 Hyperlipidemia, unspecified: Secondary | ICD-10-CM | POA: Diagnosis present

## 2020-02-15 DIAGNOSIS — Y92009 Unspecified place in unspecified non-institutional (private) residence as the place of occurrence of the external cause: Secondary | ICD-10-CM

## 2020-02-15 DIAGNOSIS — Z905 Acquired absence of kidney: Secondary | ICD-10-CM

## 2020-02-15 DIAGNOSIS — Z79899 Other long term (current) drug therapy: Secondary | ICD-10-CM

## 2020-02-15 DIAGNOSIS — J45909 Unspecified asthma, uncomplicated: Secondary | ICD-10-CM | POA: Diagnosis present

## 2020-02-15 DIAGNOSIS — Z801 Family history of malignant neoplasm of trachea, bronchus and lung: Secondary | ICD-10-CM

## 2020-02-15 DIAGNOSIS — I152 Hypertension secondary to endocrine disorders: Secondary | ICD-10-CM | POA: Diagnosis present

## 2020-02-15 DIAGNOSIS — D849 Immunodeficiency, unspecified: Secondary | ICD-10-CM | POA: Diagnosis present

## 2020-02-15 LAB — BASIC METABOLIC PANEL
Anion gap: 11 (ref 5–15)
BUN: 16 mg/dL (ref 6–20)
CO2: 27 mmol/L (ref 22–32)
Calcium: 9.2 mg/dL (ref 8.9–10.3)
Chloride: 102 mmol/L (ref 98–111)
Creatinine, Ser: 1.02 mg/dL — ABNORMAL HIGH (ref 0.44–1.00)
GFR calc Af Amer: >60 mL/min (ref >60)
GFR calc non Af Amer: >60 mL/min (ref >60)
Glucose, Bld: 100 mg/dL — ABNORMAL HIGH (ref 70–99)
Potassium: 3.9 mmol/L (ref 3.5–5.1)
Sodium: 140 mmol/L (ref 135–145)

## 2020-02-15 LAB — TROPONIN I (HIGH SENSITIVITY)
Troponin I (High Sensitivity): 6 ng/L (ref <18)
Troponin I (High Sensitivity): 6 ng/L (ref <18)

## 2020-02-15 LAB — CBC
HCT: 38.8 % (ref 36.0–46.0)
Hemoglobin: 12.2 g/dL (ref 12.0–15.0)
MCH: 28 pg (ref 26.0–34.0)
MCHC: 31.4 g/dL (ref 30.0–36.0)
MCV: 89.2 fL (ref 80.0–100.0)
Platelets: 202 10*3/uL (ref 150–400)
RBC: 4.35 MIL/uL (ref 3.87–5.11)
RDW: 13.2 % (ref 11.5–15.5)
WBC: 5.6 10*3/uL (ref 4.0–10.5)
nRBC: 0 % (ref 0.0–0.2)

## 2020-02-15 LAB — I-STAT BETA HCG BLOOD, ED (MC, WL, AP ONLY): I-stat hCG, quantitative: <5 m[IU]/mL (ref <5)

## 2020-02-15 MED ORDER — OXYCODONE-ACETAMINOPHEN 5-325 MG PO TABS
1.0000 | ORAL_TABLET | Freq: Every day | ORAL | Status: DC
  Administered 2020-02-15 – 2020-02-17 (×9): 1 via ORAL
  Filled 2020-02-15 (×9): qty 1

## 2020-02-15 MED ORDER — VENLAFAXINE HCL ER 75 MG PO CP24
75.0000 mg | ORAL_CAPSULE | Freq: Every day | ORAL | Status: DC
  Administered 2020-02-16 – 2020-02-17 (×2): 75 mg via ORAL
  Filled 2020-02-15 (×3): qty 1

## 2020-02-15 MED ORDER — HYDRALAZINE HCL 20 MG/ML IJ SOLN: 5.0000 mg | INTRAMUSCULAR | Status: DC | PRN

## 2020-02-15 MED ORDER — BISACODYL 5 MG PO TBEC: 5.0000 mg | DELAYED_RELEASE_TABLET | Freq: Every day | ORAL | Status: DC | PRN

## 2020-02-15 MED ORDER — PANTOPRAZOLE SODIUM 40 MG PO TBEC
40.0000 mg | DELAYED_RELEASE_TABLET | Freq: Two times a day (BID) | ORAL | Status: DC
  Administered 2020-02-15 – 2020-02-17 (×4): 40 mg via ORAL
  Filled 2020-02-15 (×4): qty 1

## 2020-02-15 MED ORDER — POTASSIUM CITRATE ER 10 MEQ (1080 MG) PO TBCR
10.0000 meq | EXTENDED_RELEASE_TABLET | Freq: Three times a day (TID) | ORAL | Status: DC
  Administered 2020-02-16 – 2020-02-17 (×5): 10 meq via ORAL
  Filled 2020-02-15 (×7): qty 1

## 2020-02-15 MED ORDER — MOMETASONE FURO-FORMOTEROL FUM 200-5 MCG/ACT IN AERO
2.0000 | INHALATION_SPRAY | Freq: Two times a day (BID) | RESPIRATORY_TRACT | Status: DC
  Administered 2020-02-16 – 2020-02-17 (×3): 2 via RESPIRATORY_TRACT
  Filled 2020-02-15: qty 8.8

## 2020-02-15 MED ORDER — AMITRIPTYLINE HCL 50 MG PO TABS
50.0000 mg | ORAL_TABLET | Freq: Every day | ORAL | Status: DC
  Administered 2020-02-15 – 2020-02-16 (×2): 50 mg via ORAL
  Filled 2020-02-15 (×2): qty 1

## 2020-02-15 MED ORDER — PREGABALIN 100 MG PO CAPS
100.0000 mg | ORAL_CAPSULE | Freq: Two times a day (BID) | ORAL | Status: DC
  Administered 2020-02-15 – 2020-02-17 (×4): 100 mg via ORAL
  Filled 2020-02-15 (×4): qty 1

## 2020-02-15 MED ORDER — SODIUM CHLORIDE 0.9 % IV SOLN: 2.0000 g | Freq: Three times a day (TID) | INTRAVENOUS | Status: DC

## 2020-02-15 MED ORDER — ACETAMINOPHEN 650 MG RE SUPP: 650.0000 mg | Freq: Four times a day (QID) | RECTAL | Status: DC | PRN

## 2020-02-15 MED ORDER — FUROSEMIDE 20 MG PO TABS
20.0000 mg | ORAL_TABLET | Freq: Every morning | ORAL | Status: DC
  Administered 2020-02-16 – 2020-02-17 (×2): 20 mg via ORAL
  Filled 2020-02-15 (×2): qty 1

## 2020-02-15 MED ORDER — ZOLPIDEM TARTRATE 5 MG PO TABS
5.0000 mg | ORAL_TABLET | Freq: Every evening | ORAL | Status: DC | PRN
  Filled 2020-02-15: qty 1

## 2020-02-15 MED ORDER — POLYETHYLENE GLYCOL 3350 17 G PO PACK
17.0000 g | PACK | Freq: Every day | ORAL | Status: DC
  Administered 2020-02-16 – 2020-02-17 (×2): 17 g via ORAL
  Filled 2020-02-15 (×2): qty 1

## 2020-02-15 MED ORDER — LOSARTAN POTASSIUM 50 MG PO TABS
50.0000 mg | ORAL_TABLET | Freq: Every day | ORAL | Status: DC
  Administered 2020-02-16 – 2020-02-17 (×2): 50 mg via ORAL
  Filled 2020-02-15 (×2): qty 1

## 2020-02-15 MED ORDER — IBUPROFEN 400 MG PO TABS
400.0000 mg | ORAL_TABLET | Freq: Once | ORAL | Status: AC | PRN
  Administered 2020-02-15: 400 mg via ORAL
  Filled 2020-02-15: qty 1

## 2020-02-15 MED ORDER — ENOXAPARIN SODIUM 40 MG/0.4ML ~~LOC~~ SOLN
40.0000 mg | SUBCUTANEOUS | Status: DC
  Administered 2020-02-15 – 2020-02-16 (×2): 40 mg via SUBCUTANEOUS
  Filled 2020-02-15 (×2): qty 0.4

## 2020-02-15 MED ORDER — ORITAVANCIN DIPHOSPHATE 400 MG IV SOLR
1200.0000 mg | Freq: Once | INTRAVENOUS | Status: AC
  Administered 2020-02-16: 1200 mg via INTRAVENOUS
  Filled 2020-02-15: qty 120

## 2020-02-15 MED ORDER — ROSUVASTATIN CALCIUM 5 MG PO TABS
5.0000 mg | ORAL_TABLET | Freq: Every morning | ORAL | Status: DC
  Administered 2020-02-16 – 2020-02-17 (×2): 5 mg via ORAL
  Filled 2020-02-15 (×2): qty 1

## 2020-02-15 MED ORDER — MONTELUKAST SODIUM 10 MG PO TABS
10.0000 mg | ORAL_TABLET | Freq: Every day | ORAL | Status: DC
  Administered 2020-02-15 – 2020-02-16 (×2): 10 mg via ORAL
  Filled 2020-02-15 (×2): qty 1

## 2020-02-15 MED ORDER — VERAPAMIL HCL ER 240 MG PO TBCR
240.0000 mg | EXTENDED_RELEASE_TABLET | Freq: Every day | ORAL | Status: DC
  Administered 2020-02-17: 240 mg via ORAL
  Filled 2020-02-15 (×4): qty 1

## 2020-02-15 MED ORDER — LORATADINE 10 MG PO TABS
10.0000 mg | ORAL_TABLET | Freq: Every day | ORAL | Status: DC
  Administered 2020-02-16 – 2020-02-17 (×2): 10 mg via ORAL
  Filled 2020-02-15 (×3): qty 1

## 2020-02-15 MED ORDER — ONDANSETRON HCL 4 MG/2ML IJ SOLN: 4.0000 mg | Freq: Four times a day (QID) | INTRAMUSCULAR | Status: DC | PRN

## 2020-02-15 MED ORDER — ACETAMINOPHEN 325 MG PO TABS
650.0000 mg | ORAL_TABLET | Freq: Four times a day (QID) | ORAL | Status: DC | PRN
  Administered 2020-02-16 – 2020-02-17 (×3): 650 mg via ORAL
  Filled 2020-02-15 (×3): qty 2

## 2020-02-15 MED ORDER — ONDANSETRON HCL 4 MG PO TABS: 4.0000 mg | ORAL_TABLET | Freq: Four times a day (QID) | ORAL | Status: DC | PRN

## 2020-02-15 MED ORDER — DOCUSATE SODIUM 100 MG PO CAPS
100.0000 mg | ORAL_CAPSULE | Freq: Two times a day (BID) | ORAL | Status: DC
  Administered 2020-02-15 – 2020-02-17 (×4): 100 mg via ORAL
  Filled 2020-02-15 (×4): qty 1

## 2020-02-15 MED ORDER — ALBUTEROL SULFATE (2.5 MG/3ML) 0.083% IN NEBU: 3.0000 mL | INHALATION_SOLUTION | RESPIRATORY_TRACT | Status: DC | PRN

## 2020-02-15 MED ORDER — POTASSIUM CITRATE ER 10 MEQ (1080 MG) PO TBCR
10.0000 meq | EXTENDED_RELEASE_TABLET | Freq: Three times a day (TID) | ORAL | Status: DC
  Filled 2020-02-15 (×2): qty 1

## 2020-02-15 NOTE — ED Provider Notes (Signed)
[facility] EMERGENCY DEPARTMENT Provider Note   CSN: [id number] Arrival date & time: 02/15/20  3220     History Chief Complaint  Patient presents with  . Chest Pain    Dominique Evans is a 54 y.o. female.  54 year old female with past medical history of psoriasis (on Humira), diabetes, cognitive disorder presents with feeling generally unwell today and concern for fever with temperature of 100.2 oral at home.  Patient states that she was recently admitted to the hospital for sepsis, was discharged home on April 9 and continued with 5-day course of doxycycline for infection near her ear.  Patient states that her symptoms resolved and then today has return of right ear pain and swelling with redness.  Patient is a difficult historian, does not think she is taken anything for fever today although she is prescribed Percocet, has been afebrile in the ER.        Past Medical History:  Diagnosis Date  . Allergic rhinitis   . Anemia   . Arthritis    spondylolisthesis- lumbar region  . Asthma    PFT 08/12/07: FEV1 2.97 (104%), FEV1% 84, TLC 4.89 (90%), DLCO 84%, +BD  . Blood transfusion    hx of 2002   . Chronic constipation   . Chronic pain 09/24/2018  . 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 Diabetic neuropathy (HCC) 09/22/2019  . Esophageal dysmotility 03/01/2015  . Esophageal dysphagia 06/10/2019  . GERD (gastroesophageal reflux disease)   . Head injury due to trauma    age 68, went under car while sledding, had memory loss at that time  . HNP (herniated nucleus pulposus), lumbar 11/30/2012  . 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 Hx of hiatal hernia   . Hydatidiform mole   . Hyperlipidemia   . Lateral ventral hernia 03/01/2015  . Major depressive disorder with single episode 09/24/2018  . Mild neurocognitive disorder, unclear etiology 10/25/2019  . 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 Obesity   .  Obstructive sleep apnea 10/31/2007   Qualifier: Diagnosis of  By: Craige Cotta MD, Vineet    . Panic attacks   . PONV (postoperative nausea and vomiting)   . Psoriasis   . Status post dilation of esophageal narrowing   . Type 2 diabetes mellitus with foot ulcer (HCC) 10/04/2007   Qualifier: Diagnosis of  By: Craige Cotta MD, Vineet    . Unspecified essential hypertension   . Visual disturbance     Patient Active Problem List   Diagnosis Date Noted  . DM (diabetes mellitus), type 2 (HCC) 01/24/2020  . SIRS (systemic inflammatory response syndrome) (HCC) 01/24/2020  . Facial cellulitis 01/24/2020  . Mild neurocognitive disorder, unclear etiology 10/25/2019  . Pain due to onychomycosis of toenails of both feet 09/22/2019  . Diabetic neuropathy (HCC) 09/22/2019  . Pre-ulcerative corn or callous 09/22/2019  . Esophageal dysphagia 06/10/2019  . Intractable nausea and vomiting 06/09/2019  . Cellulitis of left foot 04/27/2019  . Hyperlipidemia 02/16/2019  . Chronic pain 09/24/2018  . Incisional hernia 09/24/2018  . Low back pain 09/24/2018  . Major depressive disorder with single episode 09/24/2018  . 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  . Esophageal dysmotility 03/01/2015  . Lateral ventral hernia 03/01/2015  . S/P lumbar spinal fusion 05/23/2014  . Edema 06/28/2013  . 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 HNP (herniated nucleus pulposus), lumbar 11/30/2012  . Thoracic  aortic aneurysm 10/23/2010  . Constipation, chronic 03/23/2010  . Obesity, unspecified 10/31/2007  . Obstructive sleep apnea 10/31/2007  . Chronic rhinitis 10/31/2007  . Cough variant asthma 10/31/2007  . GERD (gastroesophageal reflux disease) 10/31/2007  . Type 2 diabetes mellitus with foot ulcer (HCC) 10/04/2007  . Hypertension associated with diabetes (HCC) 10/04/2007  . 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/04/2007    Past Surgical History:  Procedure Laterality Date  . BIOPSY  06/11/2019   Procedure:  BIOPSY;  Surgeon: Charna Elizabeth, MD;  Location: [facility] ENDOSCOPY;  Service: Endoscopy;;  . BOTOX INJECTION N/A 06/07/2015   Procedure: BOTOX INJECTION;  Surgeon: Louis Meckel, MD;  Location: [facility] ENDOSCOPY;  Service: Endoscopy;  Laterality: N/A;  . COLONOSCOPY WITH PROPOFOL N/A 06/07/2015   Procedure: COLONOSCOPY WITH PROPOFOL;  Surgeon: Louis Meckel, MD;  Location: [facility] ENDOSCOPY;  Service: Endoscopy;  Laterality: N/A;  . ELBOW FRACTURE SURGERY     left  . ESOPHAGEAL MANOMETRY N/A 04/08/2015   Procedure: ESOPHAGEAL MANOMETRY (EM);  Surgeon: Louis Meckel, MD;  Location: [facility] ENDOSCOPY;  Service: Endoscopy;  Laterality: N/A;  . ESOPHAGOGASTRODUODENOSCOPY (EGD) WITH PROPOFOL N/A 06/07/2015   Procedure: ESOPHAGOGASTRODUODENOSCOPY (EGD) WITH PROPOFOL;  Surgeon: Louis Meckel, MD;  Location: [facility] ENDOSCOPY;  Service: Endoscopy;  Laterality: N/A;  . ESOPHAGOGASTRODUODENOSCOPY (EGD) WITH PROPOFOL Left 06/11/2019   Procedure: ESOPHAGOGASTRODUODENOSCOPY (EGD) WITH PROPOFOL;  Surgeon: Charna Elizabeth, MD;  Location: [facility] ENDOSCOPY;  Service: Endoscopy;  Laterality: Left;  Marland Kitchen GASTRIC ROUX-EN-Y  10/05/2011   Procedure: LAPAROSCOPIC ROUX-EN-Y GASTRIC;  Surgeon: Mariella Saa, MD;  Location: [facility] ORS;  Service: General;  Laterality: N/A;  UPPER ENDOSCOPY  . lap band removal     . LAPAROSCOPIC GASTRIC BANDING  02/2006   w/ truncal vagotomy  . LUMBAR LAMINECTOMY/DECOMPRESSION MICRODISCECTOMY Left 11/30/2012   Procedure: MICRO LUMBAR DECOMPRESSION L4-5 ON THE LEFT;  Surgeon: Javier Docker, MD;  Location: [facility] ORS;  Service: Orthopedics;  Laterality: Left;  MICRO LUMBAR DECOMPRESSION L4-5 ON THE LEFT  . LUMBAR LAMINECTOMY/DECOMPRESSION MICRODISCECTOMY N/A 03/30/2013   Procedure: REDO MICRO LUMBAR DECOMPRESSION L4-5;  Surgeon: Javier Docker, MD;  Location: [facility] ORS;  Service: Orthopedics;  Laterality: N/A;  . MAXIMUM ACCESS (MAS)POSTERIOR LUMBAR INTERBODY FUSION (PLIF) 1 LEVEL N/A 05/23/2014   Procedure: FOR MAXIMUM ACCESS  (MAS) POSTERIOR LUMBAR INTERBODY FUSION (PLIF) Lumbar Four-Five;  Surgeon: Tia Alert, MD;  Location: [facility] NEURO ORS;  Service: Neurosurgery;  Laterality: N/A;  FOR MAXIMUM ACCESS (MAS) POSTERIOR LUMBAR INTERBODY FUSION (PLIF) Lumbar Four-Five  . NEPHRECTOMY  2002   left  . PATENT DUCTUS ARTERIOUS REPAIR    . SHOULDER ARTHROSCOPY     right shoulder     OB History   No obstetric history on file.     Family History  Problem Relation Age of Onset  . Hypertension Father   . Skin cancer Father   . Heart disease Father        before age 9  . Heart attack Father   . Hyperlipidemia Father   . Diabetes Mother   . Hypertension Mother   . Skin cancer Mother   . Breast cancer Mother        Melanoma  . Heart disease Mother   . Hyperlipidemia Mother   . Lung cancer Mother   . Brain cancer Mother   . Hypertension Sister   . Heart disease Sister        before age 33  . Heart attack Sister   .  Hyperlipidemia Sister   . Breast cancer Maternal Aunt   . Colon cancer Neg Hx   . Colon polyps Neg Hx   . Esophageal cancer Neg Hx   . Gallbladder disease Neg Hx   . Stomach cancer Neg Hx     Social History   Tobacco Use  . Smoking status: Former Smoker    Packs/day: 2.00    Types: Cigarettes    Quit date: 10/19/1982    Years since quitting: 37.3  . Smokeless tobacco: Never Used  Substance Use Topics  . Alcohol use: No    Alcohol/week: 0.0 standard drinks  . Drug use: No    Home Medications Prior to Admission medications   Medication Sig Start Date End Date Taking? Authorizing Provider  albuterol (PROVENTIL HFA;VENTOLIN HFA) 108 (90 BASE) MCG/ACT inhaler Inhale 1-2 puffs into the lungs every 4 (four) hours as needed for wheezing or shortness of breath.   Yes [provider]  amitriptyline (ELAVIL) 25 MG tablet Take 50 mg by mouth at bedtime.    Yes [provider]  Calcium Carb-Cholecalciferol (CALCIUM 500+D3) 500-400 MG-UNIT TABS Take 1 tablet by mouth daily.    Yes [provider]  [employer] DIGESTIVE PROBIOTIC 250 MG capsule Take 500 mg by mouth daily. 01/31/20  Yes [provider]  ferrous sulfate 325 (65 FE) MG tablet Take 325 mg by mouth daily with breakfast.   Yes [provider]  fexofenadine (ALLEGRA) 180 MG tablet Take 180 mg by mouth daily.   Yes [provider]  Fluticasone-Salmeterol (ADVAIR DISKUS) 250-50 MCG/DOSE AEPB Inhale 1 puff into the lungs 2 (two) times daily.    Yes [provider]  furosemide (LASIX) 20 MG tablet Take 20 mg by mouth every morning.    Yes [provider]  HUMIRA PEN 40 MG/0.4ML PNKT Inject 40 mg into the skin every 14 (fourteen) days. 01/05/20  Yes [provider]  lansoprazole (PREVACID) 30 MG capsule TAKE 1 CAPSULE BY MOUTH TWICE A DAY BEFORE A MEAL WAIT AT LEAST 30 MINUTES BEFORE EATING Patient taking differently: Take 30 mg by mouth in the morning and at bedtime.  09/04/19  Yes Nandigam, Venia Minks, MD  losartan (COZAAR) 50 MG tablet Take 50 mg by mouth daily. 02/07/20  Yes [provider]  montelukast (SINGULAIR) 10 MG tablet Take 10 mg by mouth at bedtime.    Yes [provider]  Multiple Vitamins-Minerals (MULTIVITAMIN PO) Take 1 tablet by mouth daily.    Yes [provider]  oxyCODONE-acetaminophen (PERCOCET/ROXICET) 5-325 MG tablet Take 1 tablet by mouth 5 (five) times daily.    Yes [provider]  polyethylene glycol powder (MIRALAX) 17 GM/SCOOP powder Take 17 g by mouth daily.    Yes [provider]  potassium citrate (UROCIT-K) 10 MEQ (1080 MG) SR tablet Take 10 mEq by mouth 3 (three) times daily with meals.    Yes [provider]  pregabalin (LYRICA) 100 MG capsule Take 100 mg by mouth 2 (two) times daily. 01/11/20  Yes [provider]  PRISTIQ 50 MG 24 hr tablet Take 50 mg by mouth daily. 04/20/15  Yes [provider]  rosuvastatin (CRESTOR) 10 MG tablet Take 5 mg by mouth every  morning.    Yes [provider]  verapamil (VERELAN PM) 240 MG 24 hr capsule Take 240 mg by mouth daily. 01/02/20  Yes [provider]  pantoprazole (PROTONIX) 20 MG tablet Take 1 tablet (20 mg total) by mouth daily.  06/09/19 06/09/19  Albrizze, Kaitlyn E, PA-C    Allergies    Penicillins, Sulfonamide derivatives, Clindamycin/lincomycin, and Ciprofloxacin  Review of Systems   Review of Systems  Constitutional: Positive for fever.  Respiratory: Negative for shortness of breath.   Cardiovascular: Negative for chest pain.  Gastrointestinal: Negative for abdominal pain, nausea and vomiting.  Musculoskeletal: Positive for arthralgias and myalgias.  Skin: Positive for color change and rash.  Allergic/Immunologic: Positive for immunocompromised state.  Neurological: Positive for headaches.  All other systems reviewed and are negative.   Physical Exam Updated Vital Signs BP 126/69   Pulse 75   Temp 99.8 F (37.7 C)   Resp 18   Ht 5\' 6"  (1.676 m)   Wt 93.9 kg   LMP 12/21/2015   SpO2 96%   BMI 33.41 kg/m   Physical Exam Vitals and nursing note reviewed.  Constitutional:      General: She is not in acute distress.    Appearance: She is well-developed. She is not diaphoretic.  HENT:     Head: Atraumatic.     Right Ear: Tympanic membrane normal. No swelling.     Left Ear: Tympanic membrane normal. No swelling.     Ears:     Comments: Redness and tenderness of the right external ear, redness extends onto the face. Eyes:     Extraocular Movements: Extraocular movements intact.     Pupils: Pupils are equal, round, and reactive to light.  Cardiovascular:     Rate and Rhythm: Normal rate and regular rhythm.     Heart sounds: Normal heart sounds.  Pulmonary:     Effort: Pulmonary effort is normal.     Breath sounds: Normal breath sounds.  Musculoskeletal:     Right lower leg: No edema.     Left lower leg: No edema.  Skin:    General: Skin is warm and dry.      Findings: Erythema present.  Neurological:     Mental Status: She is alert and oriented to person, place, and time.  Psychiatric:        Behavior: Behavior normal.       ED Results / Procedures / Treatments   Labs (all labs ordered are listed, but only abnormal results are displayed) Labs Reviewed  BASIC METABOLIC PANEL - Abnormal; Notable for the following components:      Result Value   Glucose, Bld 100 (*)    Creatinine, Ser 1.02 (*)    All other components within normal limits  CULTURE, BLOOD (ROUTINE X 2)  CULTURE, BLOOD (ROUTINE X 2)  CBC  I-STAT BETA HCG BLOOD, ED ([facility], [facility], [facility] ONLY)  TROPONIN I (HIGH SENSITIVITY)  TROPONIN I (HIGH SENSITIVITY)    EKG EKG Interpretation  Date/Time:  Thursday February 15 2020 08:51:29 EDT Ventricular Rate:  112 PR Interval:  132 QRS Duration: 84 QT Interval:  332 QTC Calculation: 453 R Axis:   30 Text Interpretation: Sinus tachycardia Otherwise normal ECG When compared with ECG of 01/23/2020, No significant change was found Confirmed by 03/24/2020 (Dione Booze) on 02/15/2020 3:07:46 PM   Radiology DG Chest 2 View  Result Date: 02/15/2020 CLINICAL DATA:  Left-sided chest pain.  Back pain. EXAM: CHEST - 2 VIEW COMPARISON:  01/23/2020. FINDINGS: Right-sided aortic arch again noted. Heart size normal. Lungs are clear. No pleural effusion or pneumothorax. Degenerative change thoracic spine. Surgical clips upper abdomen. Degenerative change thoracic spine. IMPRESSION: No acute cardiopulmonary disease. Electronically Signed   By: 03/24/2020  Register  On: 02/15/2020 09:18    Procedures Procedures (including critical care time)  Medications Ordered in ED Medications  ceFEPIme (MAXIPIME) 2 g in sodium chloride 0.9 % 100 mL IVPB (has no administration in time range)  ibuprofen (ADVIL) tablet 400 mg (400 mg Oral Given 02/15/20 [id number])    ED Course  I have reviewed the triage vital signs and the nursing notes.  Pertinent labs & imaging results that  were available during my care of the patient were reviewed by me and considered in my medical decision making (see chart for details).  Clinical Course as of Feb 15 1508  Thu Feb 15, 2020  [id number] 54 year old female returns to the ER with concern for fever and feeling unwell.  Patient was discharged from the hospital on April 9 following admission for facial cellulitis, treated with aztreonam in the hospital, discharged with course of doxycycline which she states she completed.  Patient states her symptoms have totally resolved and then woke up today feeling unwell with oral temperature of 100.2 and return of redness and swelling with pain to her right ear and right side face. Lab work including CBC and BMP without significant changes, normal white count.  hCG is negative.  Patient had troponins for report of pain in triage, both are 6/unchanged.  Chest x-ray is unremarkable. Concern for return of right perichondritis, patient is a diabetic on Humira.  Discussed with Dr. Madilyn Hookees, ER attending who has seen the patient.   [LM]  1442 Discussed patient's allergies with pharmacist, Romeo AppleBen, unfortunately patient is allergic to Cipro with a reaction of hives, this is the only oral medication that could be used to treat her outpatient, will need to admit her for IV antibiotics.  Will give cefepime and monitor for reaction.   [LM]  1509 Case discussed Dr. Ophelia CharterYates with [facility] hospitalist service who will consult for admission.   [LM]    Clinical Course User Index [LM] Alden HippMurphy, Dannette Kinkaid A, PA-C   MDM Rules/Calculators/A&P                      Final Clinical Impression(s) / ED Diagnoses Final diagnoses:  Perichondritis    Rx / DC Orders ED Discharge Orders    None       Alden HippMurphy, Tyrese Capriotti A, PA-C 02/15/20 1510    Tilden Fossaees, Elizabeth, MD 02/16/20 [phone]

## 2020-02-15 NOTE — ED Triage Notes (Signed)
Pt states she thinks she is septic. Endorses ear pain and swelling, back pain that radiates down her legs, and left sided chest pain.

## 2020-02-15 NOTE — H&P (Addendum)
History and Physical    Dominique Evans ZOX:[id number] DOB: 05/12/1966 DOA: 02/15/2020  PCP: [name], MD Consultants:  [facility] - neurology; Stacie Acres - podiatry; Lavon Paganini - GI; Sood - pulmonology Patient coming from:  Home - lives with her son, Dominique Evans; [place]: Lockie Mola, [phone]  Chief Complaint: Ear pain and swelling  HPI: Dominique Evans is a 54 y.o. female with medical history significant of psoriasis (on Humira); HTN; DM; anxiety/depression; obesity (BMI 33); TBI/cognitive impairment; chronic pain; OSA; HLD; and gastric bypass presenting with ear pain and swelling.  She was previously admitted from 4/6-9 with facial cellulitis, treated with doxycycline.   She has done ok, but her legs were hurting this AM upon awakening.  Her temp was 100.2 orally.  Her ear swelled again and got red.  No known injury.  She has pain extending along her posterior ear and down her neck.   ED Course:  Recent hospitalization with facial cellulitis - Aztreonam -> doxy.  Doing well until today - return of redness and swelling to R ear, T 100.2.  Took Percocet, ?fever.  Has perichondritis.  No good PO antibiotic option for pseudomonas coverage due to allergies.  Review of Systems: As per HPI; otherwise review of systems reviewed and negative.   Ambulatory Status:  Ambulates with cane  COVID Vaccine Status:  None  Past Medical History:  Diagnosis Date  . Allergic rhinitis   . Anemia   . Arthritis    spondylolisthesis- lumbar region  . Asthma    PFT 08/12/07: FEV1 2.97 (104%), FEV1% 84, TLC 4.89 (90%), DLCO 84%, +BD  . Blood transfusion    hx of 2002   . Chronic constipation   . Chronic pain 09/24/2018  . Chronic rhinitis 10/31/2007   Qualifier: Diagnosis of  By: Craige Cotta MD, Vineet    . 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 Diabetic neuropathy (HCC) 09/22/2019  . Esophageal dysmotility 03/01/2015  . Esophageal dysphagia 06/10/2019  . GERD (gastroesophageal  reflux disease)   . Head injury due to trauma    age 77, went under car while sledding, had memory loss at that time  . 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 History of stress test    exercise stress test, ? where it was done,  about 6 yrs. ago,  results- negative   . HNP (herniated nucleus pulposus), lumbar 11/30/2012  . 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 Hx of hiatal hernia   . Hydatidiform mole   . Hyperlipidemia   . Hypertension associated with diabetes (HCC) 10/04/2007   Qualifier: Diagnosis of  By: Craige Cotta MD, Vineet    . Lateral ventral hernia 03/01/2015  . Low back pain 09/24/2018  . Major depressive disorder with single episode 09/24/2018  . Mild neurocognitive disorder, unclear etiology 10/25/2019  . 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 Obesity   . Obstructive sleep apnea 10/31/2007   Qualifier: Diagnosis of  By: Craige Cotta MD, Vineet    . Panic attacks   . Peripheral neuropathy    bilateral feet  . PONV (postoperative nausea and vomiting)   . Psoriasis   . Seasonal allergies   . Status post dilation of esophageal narrowing   . Type 2 diabetes mellitus with foot ulcer (HCC) 10/04/2007   Qualifier: Diagnosis of  By: Craige Cotta MD, Vineet    . Unspecified essential hypertension   . Visual disturbance  Past Surgical History:  Procedure Laterality Date  . BIOPSY  06/11/2019   Procedure: BIOPSY;  Surgeon: Charna ElizabethMann, Jyothi, MD;  [facility];  Service: Endoscopy;;  . BOTOX INJECTION N/A 06/07/2015   Procedure: BOTOX INJECTION;  Surgeon: Louis Meckelobert D Kaplan, MD;  Location: [facility] ENDOSCOPY;  Service: Endoscopy;  Laterality: N/A;  . COLONOSCOPY WITH PROPOFOL N/A 06/07/2015   Procedure: COLONOSCOPY WITH PROPOFOL;  Surgeon: Louis Meckelobert D Kaplan, MD;  Location: [facility] ENDOSCOPY;  Service: Endoscopy;  Laterality: N/A;  . ELBOW FRACTURE SURGERY     left  . ESOPHAGEAL MANOMETRY N/A 04/08/2015   Procedure: ESOPHAGEAL MANOMETRY (EM);  Surgeon: Louis Meckelobert D Kaplan, MD;  Location: [facility]  ENDOSCOPY;  Service: Endoscopy;  Laterality: N/A;  . ESOPHAGOGASTRODUODENOSCOPY (EGD) WITH PROPOFOL N/A 06/07/2015   Procedure: ESOPHAGOGASTRODUODENOSCOPY (EGD) WITH PROPOFOL;  Surgeon: Louis Meckelobert D Kaplan, MD;  Location: [facility] ENDOSCOPY;  Service: Endoscopy;  Laterality: N/A;  . ESOPHAGOGASTRODUODENOSCOPY (EGD) WITH PROPOFOL Left 06/11/2019   Procedure: ESOPHAGOGASTRODUODENOSCOPY (EGD) WITH PROPOFOL;  Surgeon: Charna ElizabethMann, Jyothi, MD;  [facility];  Service: Endoscopy;  Laterality: Left;  Marland Kitchen. 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 lap band removal     . LAPAROSCOPIC GASTRIC BANDING  02/2006   w/ truncal vagotomy  . LUMBAR LAMINECTOMY/DECOMPRESSION MICRODISCECTOMY Left 11/30/2012   Procedure: MICRO LUMBAR DECOMPRESSION L4-5 ON THE LEFT;  Surgeon: Javier DockerJeffrey C Beane, MD;  Location: [facility] ORS;  Service: Orthopedics;  Laterality: Left;  MICRO LUMBAR DECOMPRESSION L4-5 ON THE LEFT  . LUMBAR LAMINECTOMY/DECOMPRESSION MICRODISCECTOMY N/A 03/30/2013   Procedure: REDO MICRO LUMBAR DECOMPRESSION L4-5;  Surgeon: Javier DockerJeffrey C Beane, MD;  Location: [facility] ORS;  Service: Orthopedics;  Laterality: N/A;  . MAXIMUM ACCESS (MAS)POSTERIOR LUMBAR INTERBODY FUSION (PLIF) 1 LEVEL N/A 05/23/2014   Procedure: FOR MAXIMUM ACCESS (MAS) POSTERIOR LUMBAR INTERBODY FUSION (PLIF) Lumbar Four-Five;  Surgeon: Tia Alertavid S Jones, MD;  Location: [facility] NEURO ORS;  Service: Neurosurgery;  Laterality: N/A;  FOR MAXIMUM ACCESS (MAS) POSTERIOR LUMBAR INTERBODY FUSION (PLIF) Lumbar Four-Five  . NEPHRECTOMY  2002   left  . PATENT DUCTUS ARTERIOUS REPAIR    . SHOULDER ARTHROSCOPY     right shoulder    Social History   Socioeconomic History  . Marital status: Divorced    Spouse name: Not on file  . Number of children: 3  . Years of education: 7014  . Highest education level: Associate degree: academic program    Occupational History  . Occupation: Sr. Engineer, maintenanceHealth Concierge    Employer: [employer]  Tobacco Use  . Smoking status: Former Smoker    Packs/day: 2.00    Types: Cigarettes    Quit date: 10/19/1982    Years since quitting: 37.3  . Smokeless tobacco: Never Used  Substance and Sexual Activity  . Alcohol use: No    Alcohol/week: 0.0 standard drinks  . Drug use: No  . Sexual activity: Not on file  Other Topics Concern  . Not on file  Social History Narrative   03/01/19 lives with son, Franky [place]   Caffeine- diet pepsi, dr pepper, coffee   Social Determinants of Health   Financial Resource Strain:   . Difficulty of Paying Living Expenses:   Food Insecurity:   . Worried About Programme researcher, broadcasting/film/videounning Out of Food in the Last Year:   . [employer] Out of Food in the Last Year:   Transportation Needs:   . Lack of  Transportation (Medical):   Marland Kitchen Lack of Transportation (Non-Medical):   Physical Activity:   . Days of Exercise per Week:   . Minutes of Exercise per Session:   Stress:   . Feeling of Stress :   Social Connections:   . Frequency of Communication with Friends and Family:   . Frequency of Social Gatherings with Friends and Family:   . Attends Religious Services:   . Active Member of Clubs or Organizations:   . Attends Archivist Meetings:   Marland Kitchen Marital Status:   Intimate Partner Violence:   . Fear of Current or Ex-Partner:   . Emotionally Abused:   Marland Kitchen Physically Abused:   . Sexually Abused:     Allergies  Allergen Reactions  . Penicillins Anaphylaxis    Has patient had a PCN reaction causing immediate rash, facial/tongue/throat swelling, SOB or lightheadedness with hypotension: yes Has patient had a PCN reaction causing severe rash involving mucus membranes or skin necrosis: unknown Has patient had a PCN reaction that required hospitalization unknown Has patient had a PCN reaction occurring within the last 10 years: no If all of the above answers are "NO", then may proceed with  Cephalosporin use.   . Sulfonamide Derivatives Anaphylaxis  . Clindamycin/Lincomycin Nausea And Vomiting  . Ciprofloxacin Hives    Family History  Problem Relation Age of Onset  . Hypertension Father   . Skin cancer Father   . Heart disease Father        before age 62  . Heart attack Father   . Hyperlipidemia Father   . Diabetes Mother   . Hypertension Mother   . Skin cancer Mother   . Breast cancer Mother        Melanoma  . Heart disease Mother   . Hyperlipidemia Mother   . Lung cancer Mother   . Brain cancer Mother   . Hypertension Sister   . Heart disease Sister        before age 44  . Heart attack Sister   . Hyperlipidemia Sister   . Breast cancer Maternal Aunt   . Colon cancer Neg Hx   . Colon polyps Neg Hx   . Esophageal cancer Neg Hx   . Gallbladder disease Neg Hx   . Stomach cancer Neg Hx     Prior to Admission medications   Medication Sig Start Date End Date Taking? Authorizing Provider  albuterol (PROVENTIL HFA;VENTOLIN HFA) 108 (90 BASE) MCG/ACT inhaler Inhale 1-2 puffs into the lungs every 4 (four) hours as needed for wheezing or shortness of breath.    [provider]  amitriptyline (ELAVIL) 25 MG tablet Take 50 mg by mouth at bedtime.     [provider]  Calcium Carb-Cholecalciferol (CALCIUM 500+D3) 500-400 MG-UNIT TABS Take 1 tablet by mouth daily.    [provider]  fexofenadine (ALLEGRA) 180 MG tablet Take 180 mg by mouth daily.    [provider]  Fluticasone-Salmeterol (ADVAIR DISKUS) 250-50 MCG/DOSE AEPB Inhale 1 puff into the lungs 2 (two) times daily.     [provider]  furosemide (LASIX) 20 MG tablet Take 20 mg by mouth every morning.     [provider]  HUMIRA PEN 40 MG/0.4ML PNKT Inject 40 mg into the skin every 14 (fourteen) days. 01/05/20   [provider]  lansoprazole (PREVACID) 30 MG capsule TAKE 1 CAPSULE BY MOUTH TWICE A DAY BEFORE A MEAL WAIT AT LEAST 30 MINUTES BEFORE  EATING Patient taking differently: Take  30 mg by mouth in the morning and at bedtime.  09/04/19   Napoleon Form, MD  montelukast (SINGULAIR) 10 MG tablet Take 10 mg by mouth at bedtime.     [provider]  Multiple Vitamins-Minerals (MULTIVITAMIN PO) Take 1 tablet by mouth daily.     [provider]  oxyCODONE-acetaminophen (PERCOCET/ROXICET) 5-325 MG tablet Take 1 tablet by mouth 5 (five) times daily.     [provider]  polyethylene glycol powder (MIRALAX) 17 GM/SCOOP powder Take 17 g by mouth daily.     [provider]  potassium citrate (UROCIT-K) 10 MEQ (1080 MG) SR tablet Take 10 mEq by mouth 3 (three) times daily with meals.     [provider]  pregabalin (LYRICA) 100 MG capsule Take 100 mg by mouth 2 (two) times daily. 01/11/20   [provider]  PRISTIQ 50 MG 24 hr tablet Take 50 mg by mouth daily. 04/20/15   [provider]  rosuvastatin (CRESTOR) 10 MG tablet Take 5 mg by mouth every morning.     [provider]  verapamil (VERELAN PM) 240 MG 24 hr capsule Take 240 mg by mouth daily. 01/02/20   [provider]  pantoprazole (PROTONIX) 20 MG tablet Take 1 tablet (20 mg total) by mouth daily. 06/09/19 06/09/19  Sherene Sires, PA-C    Physical Exam: Vitals:   02/15/20 1215 02/15/20 1245 02/15/20 1315 02/15/20 1345  BP: 120/65 129/70 130/68 126/69  Pulse: 79 81 83 75  Resp:      Temp:      TempSrc:      SpO2: 94% 94% 97% 96%  Weight:      Height:         . General:  Appears calm and comfortable and is NAD . Eyes:  PERRL, EOMI, normal lids, iris . ENT:  grossly normal hearing, lips & tongue, mmm; significant R ear inflammation and erythema extending along the entire pinna and onto the face and posterior ear without obvious ear canal involvement or glandular involvement . Neck:  no LAD, masses or thyromegaly . Cardiovascular:  RRR, no m/r/g. No LE edema.  Marland Kitchen Respiratory:   CTA bilaterally  with no wheezes/rales/rhonchi.  Normal respiratory effort. . Abdomen:  soft, NT, ND, NABS . Skin:  See picture below, description above; psoriasis noted on B LE on limited exam     . Musculoskeletal:  grossly normal tone BUE/BLE, good ROM, no bony abnormality . Psychiatric:  eccentric mood and affect, speech fluent and appropriate, AOx3 - but when I asked if she had had her COVID vaccine, she asked me what COVID is . Neurologic:  CN 2-12 grossly intact, moves all extremities in coordinated fashion    Radiological Exams on Admission: DG Chest 2 View  Result Date: 02/15/2020 CLINICAL DATA:  Left-sided chest pain.  Back pain. EXAM: CHEST - 2 VIEW COMPARISON:  01/23/2020. FINDINGS: Right-sided aortic arch again noted. Heart size normal. Lungs are clear. No pleural effusion or pneumothorax. Degenerative change thoracic spine. Surgical clips upper abdomen. Degenerative change thoracic spine. IMPRESSION: No acute cardiopulmonary disease. Electronically Signed   By: Maisie Fus  Register   On: 02/15/2020 09:18    EKG: Independently reviewed.  Sinus tachcyardia with rate 112; nonspecific ST changes with no evidence of acute ischemia   Labs on Admission: I have personally reviewed the available labs and imaging studies at the time of the admission.  Pertinent labs:   Unremarkable BMP HS troponin 6, 6 Normal CBC  HCG negative   Assessment/Plan Active Problems:   * No active hospital problems. *    R ear/facial cellulitis -Patient with recent admission for facial cellulitis, treated with Aztreonam -> Doxy due to drug allergies -She was discharged to home and has done well but woke up this AM with apparent R ear and facial cellulitis -She has reported anaphylactic allergies to PCN and Sulfa; n/v with Clinda; and hives with Cipro -Patient d/w Dr. Jenne Pane by telephone and he reports that even though this infection appears to involve only the external ear, there is still concern for staph and  pseudomonas -He would generally recommend Cipro but due to allergy recommends calling ID instead -I then called and spoke with Dr. Daiva Eves from ID -He initially considered ?carbapenem and then suggested trying Vanc first to cover strep and MRSA or try Zyvox -He finally recommended Oritavancin (trade name is Johnsie Kindred, a long-acting glycopeptide used to skin and soft tissue infections that lasts for 2 weeks following a single dose) - given allergies, will dose 1200 mg once and observe and no further treatment needed -Overnight observation with likely d/c to home tomorrow if clinically stable  HTN -Continue lasix, Cozaar, Verapamil  HLD -Continue Crestor  Cognitive impairment -Probable psychogenic component -Continue Elavil, Pristiq  Asthma -Continue Advair (Dulera formulary substitution) -Continue prn Albuterol -Continue Allegra (Claritin formulary substitution) -Continue Singulair  Psoriasis -On Humira -Humira may be contributing to recurrent infections  -If ongoing infections, may need to consider risk vs. Benefit  Chronic pain -Continue Percocet 5 times a day -No additional parenteral or PO opiates should be given, if possible -Continue Lyrica -I have reviewed this patient in the [place] Controlled Substances Reporting System.  She is receiving medications from only one provider and appears to be taking them as prescribed. -She is at increased risk of opioid misuse, diversion, or overdose.  Obesity Body mass index is 33.41 kg/m.  -Weight loss should be encouraged -Outpatient PCP/bariatric medicine f/u encouraged   Note: This patient has been tested and is pending for the novel coronavirus COVID-19.   DVT prophylaxis:   Lovenox  Code Status:  Full  Family Communication: None present; she declined having me speak with her son at the time of admission. Disposition Plan:  The patient is from: home  Anticipated d/c is to: home without [facility] services  Anticipated d/c date will  depend on clinical response to treatment, but possibly as early as tomorrow if she has excellent response to treatment  Patient is currently: acutely ill Consults called: ENT and ID by telephone only Admission status:  It is my clinical opinion that referral for OBSERVATION is reasonable and necessary in this patient based on the above information provided. The aforementioned taken together are felt to place the patient at high risk for further clinical deterioration. However it is anticipated that the patient may be medically stable for discharge from the hospital within 24 to 48 hours.    Jonah Blue MD [facility]   How to contact the [facility] Attending or Consulting provider 7A - 7P or covering provider during after hours 7P -7A, for this patient?  1. Check the care team in [facility] and look for a) attending/consulting TR[facility] provider listed and b) the [facility] team listed 2. Log into [email or web address] and use [place]'s universal password to access. If you do not have the password, please contact the hospital operator. 3. Locate the [facility] provider you are looking for under [facility] and page to a number that  you can be directly reached. 4. If you still have difficulty reaching the provider, please page the [facility] (Director on Call) for the Hospitalists listed on amion for assistance.   02/15/2020, 3:00 PM

## 2020-02-15 NOTE — ED Notes (Signed)
Pt reports she had multiple falls with head injuries. Pt was asked if she wanted a cracker with her Pill so she would not get nauseated. Pt reported she did not know what a cracker was.

## 2020-02-15 NOTE — Plan of Care (Signed)
  Problem: Activity: Goal: Risk for activity intolerance will decrease Outcome: Progressing   

## 2020-02-16 DIAGNOSIS — F329 Major depressive disorder, single episode, unspecified: Secondary | ICD-10-CM | POA: Diagnosis present

## 2020-02-16 DIAGNOSIS — K219 Gastro-esophageal reflux disease without esophagitis: Secondary | ICD-10-CM | POA: Diagnosis present

## 2020-02-16 DIAGNOSIS — I152 Hypertension secondary to endocrine disorders: Secondary | ICD-10-CM | POA: Diagnosis present

## 2020-02-16 DIAGNOSIS — Z87442 Personal history of urinary calculi: Secondary | ICD-10-CM | POA: Diagnosis not present

## 2020-02-16 DIAGNOSIS — Z881 Allergy status to other antibiotic agents status: Secondary | ICD-10-CM | POA: Diagnosis not present

## 2020-02-16 DIAGNOSIS — Z20822 Contact with and (suspected) exposure to covid-19: Secondary | ICD-10-CM | POA: Diagnosis present

## 2020-02-16 DIAGNOSIS — Z880 Allergy status to penicillin: Secondary | ICD-10-CM | POA: Diagnosis not present

## 2020-02-16 DIAGNOSIS — T364X5A Adverse effect of tetracyclines, initial encounter: Secondary | ICD-10-CM | POA: Diagnosis present

## 2020-02-16 DIAGNOSIS — G3184 Mild cognitive impairment, so stated: Secondary | ICD-10-CM | POA: Diagnosis present

## 2020-02-16 DIAGNOSIS — L409 Psoriasis, unspecified: Secondary | ICD-10-CM | POA: Diagnosis present

## 2020-02-16 DIAGNOSIS — Y929 Unspecified place or not applicable: Secondary | ICD-10-CM | POA: Diagnosis not present

## 2020-02-16 DIAGNOSIS — Z9884 Bariatric surgery status: Secondary | ICD-10-CM | POA: Diagnosis not present

## 2020-02-16 DIAGNOSIS — Y92009 Unspecified place in unspecified non-institutional (private) residence as the place of occurrence of the external cause: Secondary | ICD-10-CM | POA: Diagnosis not present

## 2020-02-16 DIAGNOSIS — D849 Immunodeficiency, unspecified: Secondary | ICD-10-CM | POA: Diagnosis present

## 2020-02-16 DIAGNOSIS — L03211 Cellulitis of face: Secondary | ICD-10-CM | POA: Diagnosis present

## 2020-02-16 DIAGNOSIS — G8929 Other chronic pain: Secondary | ICD-10-CM | POA: Diagnosis present

## 2020-02-16 DIAGNOSIS — Z905 Acquired absence of kidney: Secondary | ICD-10-CM | POA: Diagnosis not present

## 2020-02-16 DIAGNOSIS — Z882 Allergy status to sulfonamides status: Secondary | ICD-10-CM | POA: Diagnosis not present

## 2020-02-16 DIAGNOSIS — M948X9 Other specified disorders of cartilage, unspecified sites: Secondary | ICD-10-CM | POA: Diagnosis present

## 2020-02-16 DIAGNOSIS — H539 Unspecified visual disturbance: Secondary | ICD-10-CM | POA: Diagnosis present

## 2020-02-16 DIAGNOSIS — E1169 Type 2 diabetes mellitus with other specified complication: Secondary | ICD-10-CM | POA: Diagnosis present

## 2020-02-16 DIAGNOSIS — H6011 Cellulitis of right external ear: Secondary | ICD-10-CM | POA: Diagnosis present

## 2020-02-16 DIAGNOSIS — G4733 Obstructive sleep apnea (adult) (pediatric): Secondary | ICD-10-CM | POA: Diagnosis present

## 2020-02-16 DIAGNOSIS — F419 Anxiety disorder, unspecified: Secondary | ICD-10-CM | POA: Diagnosis present

## 2020-02-16 DIAGNOSIS — E1140 Type 2 diabetes mellitus with diabetic neuropathy, unspecified: Secondary | ICD-10-CM | POA: Diagnosis present

## 2020-02-16 DIAGNOSIS — Z6833 Body mass index (BMI) 33.0-33.9, adult: Secondary | ICD-10-CM | POA: Diagnosis not present

## 2020-02-16 DIAGNOSIS — E785 Hyperlipidemia, unspecified: Secondary | ICD-10-CM | POA: Diagnosis present

## 2020-02-16 LAB — SARS CORONAVIRUS 2 (TAT 6-24 HRS): SARS Coronavirus 2: NEGATIVE

## 2020-02-16 LAB — CBC
HCT: 37.4 % (ref 36.0–46.0)
Hemoglobin: 12.1 g/dL (ref 12.0–15.0)
MCH: 28.9 pg (ref 26.0–34.0)
MCHC: 32.4 g/dL (ref 30.0–36.0)
MCV: 89.3 fL (ref 80.0–100.0)
Platelets: 170 10*3/uL (ref 150–400)
RBC: 4.19 MIL/uL (ref 3.87–5.11)
RDW: 13.2 % (ref 11.5–15.5)
WBC: 4.3 10*3/uL (ref 4.0–10.5)
nRBC: 0 % (ref 0.0–0.2)

## 2020-02-16 LAB — BASIC METABOLIC PANEL
Anion gap: 6 (ref 5–15)
BUN: 15 mg/dL (ref 6–20)
CO2: 29 mmol/L (ref 22–32)
Calcium: 9.1 mg/dL (ref 8.9–10.3)
Chloride: 102 mmol/L (ref 98–111)
Creatinine, Ser: 1.03 mg/dL — ABNORMAL HIGH (ref 0.44–1.00)
GFR calc Af Amer: >60 mL/min (ref >60)
GFR calc non Af Amer: >60 mL/min (ref >60)
Glucose, Bld: 191 mg/dL — ABNORMAL HIGH (ref 70–99)
Potassium: 4.7 mmol/L (ref 3.5–5.1)
Sodium: 137 mmol/L (ref 135–145)

## 2020-02-16 MED ORDER — DIPHENHYDRAMINE-ZINC ACETATE 2-0.1 % EX CREA
TOPICAL_CREAM | Freq: Three times a day (TID) | CUTANEOUS | Status: DC | PRN
  Filled 2020-02-16: qty 28

## 2020-02-16 NOTE — Progress Notes (Signed)
PROGRESS NOTE    Dominique Evans  ENI:[id number] DOB: 10-30-1965 DOA: 02/15/2020 PCP: [name], MD   Brief Narrative:  Dominique Evans is a 54 y.o. female with medical history significant of psoriasis (on Humira); HTN; DM; anxiety/depression; obesity (BMI 33); TBI/cognitive impairment; chronic pain; OSA; HLD; and gastric bypass presenting with ear pain and swelling.  She was previously admitted from 4/6-9 with facial cellulitis, treated with doxycycline.   She has done ok, but her legs were hurting this AM upon awakening.  Her temp was 100.2 orally.  Her ear swelled again and got red.  No known injury.  She has pain extending along her posterior ear and down her neck. Recent hospitalization with facial cellulitis - Aztreonam -> doxy.  Doing well until today - return of redness and swelling to R ear, T 100.2.  Took Percocet,  With questionably reported fever.  Has perichondritis. No good PO antibiotic option for pseudomonas coverage due to allergies.  Assessment & Plan:   Principal Problem:   Cellulitis of ear, right Active Problems:   Obesity, unspecified   Hypertension associated with diabetes (HCC)   Chronic pain   Hyperlipidemia   Mild neurocognitive disorder, unclear etiology   DM (diabetes mellitus), type 2 (HCC)   R ear/facial cellulitis does not meet sepsis criteria, POA Rule out allergic reaction although less likely -Patient with recent admission for facial cellulitis, treated with Aztreonam and transition to doxycycline at discharge due to allergy profile (She has reported anaphylactic allergies to PCN and Sulfa; n/v with Clinda; and hives with Cipro) -Patient denies any new detergent, soap, make-up, food, contacts or recent travel since discharge, she does indicate cutting the grass over the past few days but otherwise has essentially been in her house since discharge -Previously discussed with Dr. Jenne Pane indicating concern for staph and pseudomonas -Unable to prescribe  Cipro due to allergies as above, Dr. Daiva Eves from ID recommended Oritavancin (trade name is Johnsie Kindred, a long-acting glycopeptide used to skin and soft tissue infections that lasts for 2 weeks following a single dose) -received single dose on 02/15/2020 1200 mg once and observe and no further treatment needed -Minimally worsening erythema today, will follow for additional 24 hours to ensure improvement in erythema and swelling prior to discharge. -We will attempt topical Benadryl in case this is truly a localized allergic reaction rather than an acute infection  HTN, essential -Continue lasix, Cozaar, Verapamil  HLD -Continue Crestor  Cognitive impairment, at baseline -Probable psychogenic component -Continue Elavil, Pristiq  Asthma, not in acute exacerbation -Continue Advair ([employer] formulary substitution) -Continue prn Albuterol -Continue Allegra (Claritin formulary substitution) -Continue Singulair  Psoriasis, stable -On Humira -Humira may be contributing to recurrent infections  -If ongoing infections, may need to consider discussion with primary care about risk vs. Benefit  Chronic pain -Continue Percocet 5 times a day -Continue Lyrica  Obesity, morbid Body mass index is 33.41 kg/m.  -Weight loss should be encouraged -Outpatient PCP/bariatric medicine f/u encouraged  DVT prophylaxis: Lovenox  Code Status:  Full  Family Communication: None present; she declined having me speak with her son at the time of admission. Disposition Plan:The patient is from: home             Anticipated d/c is to: home without [facility] services             Anticipated d/c date will depend on clinical response to treatment, likely next 24 to 48 hours  Patient is currently: acutely ill requiring close monitoring in the setting of worsening cellulitis Consults called: ENT and ID by telephone only Admission status: Transition to inpatient given ongoing worsening erythema and need  for close monitoring given concern for worsening infection which may involve patient's airway or ability to swallow safely.    Subjective: Patient complains that her right neck and face feels more hot and swollen today than previously, denies difficulty breathing chewing or swallowing.  Otherwise denies chest pain, shortness of breath, nausea, vomiting, diarrhea, constipation, headache.  Objective: Vitals:   02/15/20 1927 02/15/20 2002 02/16/20 0436 02/16/20 0834  BP:  127/68 (!) 139/100   Pulse:  67 (!) 104   Resp:  18 18   Temp: 98.1 F (36.7 C) 98.9 F (37.2 C) 98.6 F (37 C)   TempSrc: Oral Oral Oral   SpO2:  99% 100% 99%  Weight:      Height:        Intake/Output Summary (Last 24 hours) at 02/16/2020 1446 Last data filed at 02/16/2020 0600 Gross per 24 hour  Intake 1271.6 ml  Output 0 ml  Net 1271.6 ml   Filed Weights   02/15/20 0844  Weight: 93.9 kg    Examination:  General:  Pleasantly resting in bed, No acute distress. EENT:  Sclerae nonicteric, noninjected.  Extraocular movements intact bilaterally. Neck:  Without mass or deformity.  Trachea is midline. Lungs:  Clear to auscultate bilaterally without rhonchi, wheeze, or rales. Heart:  Regular rate and rhythm.  Without murmurs, rubs, or gallops. Abdomen:  Soft, nontender, nondistended.  Without guarding or rebound. Extremities: Without cyanosis, clubbing, edema, or obvious deformity. Vascular:  Dorsalis pedis and posterior tibial pulses palpable bilaterally. Skin: Right facial blanching erythema with marked tenderness, erythema advancing towards anterior aspect of the cheek and inferiorly down the neck when compared to yesterday approximately 1 to 2 cm towards bridge of the maxillofacial area and past the mandibular jaw.  Skin otherwise warm and dry, no erythema, no ulcerations.  Data Reviewed: I have personally reviewed following labs and imaging studies  CBC: Recent Labs  Lab 02/15/20 0852 02/16/20 0151   WBC 5.6 4.3  HGB 12.2 12.1  HCT 38.8 37.4  MCV 89.2 89.3  PLT 202 170   Basic Metabolic Panel: Recent Labs  Lab 02/15/20 0852 02/16/20 0151  NA 140 137  K 3.9 4.7  CL 102 102  CO2 27 29  GLUCOSE 100* 191*  BUN 16 15  CREATININE 1.02* 1.03*  CALCIUM 9.2 9.1   GFR: Estimated Creatinine Clearance: 71.2 mL/min (A) (by C-G formula based on SCr of 1.03 mg/dL (H)). Liver Function Tests: No results for input(s): AST, ALT, ALKPHOS, BILITOT, PROT, ALBUMIN in the last 168 hours. No results for input(s): LIPASE, AMYLASE in the last 168 hours. No results for input(s): AMMONIA in the last 168 hours. Coagulation Profile: No results for input(s): INR, PROTIME in the last 168 hours. Cardiac Enzymes: No results for input(s): CKTOTAL, CKMB, CKMBINDEX, TROPONINI in the last 168 hours. BNP (last 3 results) No results for input(s): PROBNP in the last 8760 hours. HbA1C: No results for input(s): HGBA1C in the last 72 hours. CBG: No results for input(s): GLUCAP in the last 168 hours. Lipid Profile: No results for input(s): CHOL, HDL, LDLCALC, TRIG, CHOLHDL, LDLDIRECT in the last 72 hours. Thyroid Function Tests: No results for input(s): TSH, T4TOTAL, FREET4, T3FREE, THYROIDAB in the last 72 hours. Anemia Panel: No results for input(s): VITAMINB12, FOLATE, FERRITIN, TIBC, IRON, RETICCTPCT  in the last 72 hours. Sepsis Labs: No results for input(s): PROCALCITON, LATICACIDVEN in the last 168 hours.  Recent Results (from the past 240 hour(s))  Culture, blood (routine x 2)     Status: None (Preliminary result)   Collection Time: 02/15/20  2:43 PM   Specimen: BLOOD  Result Value Ref Range Status   Specimen Description BLOOD LEFT ANTECUBITAL  Final   Special Requests   Final    BOTTLES DRAWN AEROBIC AND ANAEROBIC Blood Culture adequate volume   Culture   Final    NO GROWTH < 24 HOURS Performed at [facility], [address] [place], [place] [address]    Report Status PENDING   Incomplete  Culture, blood (routine x 2)     Status: None (Preliminary result)   Collection Time: 02/15/20  5:20 PM   Specimen: BLOOD  Result Value Ref Range Status   Specimen Description BLOOD RIGHT ANTECUBITAL  Final   Special Requests   Final    BOTTLES DRAWN AEROBIC ONLY Blood Culture adequate volume   Culture   Final    NO GROWTH < 24 HOURS Performed at [facility], [address] [place], [place] [address]    Report Status PENDING  Incomplete  SARS CORONAVIRUS 2 (TAT 6-24 HRS) Nasopharyngeal Nasopharyngeal Swab     Status: None   Collection Time: 02/15/20  7:01 PM   Specimen: Nasopharyngeal Swab  Result Value Ref Range Status   SARS Coronavirus 2 NEGATIVE NEGATIVE Final    Comment: (NOTE) SARS-CoV-2 target nucleic acids are NOT DETECTED. The SARS-CoV-2 RNA is generally detectable in upper and lower respiratory specimens during the acute phase of infection. Negative results do not preclude SARS-CoV-2 infection, do not rule out co-infections with other pathogens, and should not be used as the sole basis for treatment or other patient management decisions. Negative results must be combined with clinical observations, patient history, and epidemiological information. The expected result is Negative. Fact Sheet for Patients: HairSlick.no Fact Sheet for Healthcare Providers: quierodirigir.com This test is not yet approved or cleared by the [place] FDA and  has been authorized for detection and/or diagnosis of SARS-CoV-2 by FDA under an Emergency Use Authorization (EUA). This EUA will remain  in effect (meaning this test can be used) for the duration of the COVID-19 declaration under Section 56 4(b)(1) of the Act, 21 U.S.C. section 360bbb-3(b)(1), unless the authorization is terminated or revoked sooner. Performed at [facility], [address], [place], [place] [address]          Radiology  Studies: DG Chest 2 View  Result Date: 02/15/2020 CLINICAL DATA:  Left-sided chest pain.  Back pain. EXAM: CHEST - 2 VIEW COMPARISON:  01/23/2020. FINDINGS: Right-sided aortic arch again noted. Heart size normal. Lungs are clear. No pleural effusion or pneumothorax. Degenerative change thoracic spine. Surgical clips upper abdomen. Degenerative change thoracic spine. IMPRESSION: No acute cardiopulmonary disease. Electronically Signed   By: Maisie Fus  Register   On: 02/15/2020 09:18        Scheduled Meds: . amitriptyline  50 mg Oral QHS  . docusate sodium  100 mg Oral BID  . enoxaparin (LOVENOX) injection  40 mg Subcutaneous Q24H  . furosemide  20 mg Oral q morning - 10a  . loratadine  10 mg Oral Daily  . losartan  50 mg Oral Daily  . mometasone-formoterol  2 puff Inhalation BID  . montelukast  10 mg Oral QHS  . oxyCODONE-acetaminophen  1  tablet Oral 5 X Daily  . pantoprazole  40 mg Oral BID  . polyethylene glycol  17 g Oral Daily  . potassium citrate  10 mEq Oral TID WC  . pregabalin  100 mg Oral BID  . rosuvastatin  5 mg Oral q morning - 10a  . venlafaxine XR  75 mg Oral Q breakfast  . verapamil  240 mg Oral Daily    LOS: 0 days   Time spent: 10min  William C Lancaster, DO [facility]  If 7PM-7AM, please contact night-coverage [email or web address] 02/16/2020, 2:46 PM

## 2020-02-17 NOTE — Progress Notes (Signed)
Patient discharged home with instructions. 

## 2020-02-17 NOTE — Discharge Summary (Signed)
Physician Discharge Summary  Dominique Evans WUJ:[id number] DOB: October 18, 1966 DOA: 02/15/2020  PCP: [name], MD  Admit date: 02/15/2020 Discharge date: 02/17/2020  Admitted From: Home Disposition: Home  Recommendations for Outpatient Follow-up:  1. Follow up with PCP in 1-2 weeks 2. Please obtain BMP/CBC in one week 3. Please follow up on the following pending results:   Discharge Condition: Stable CODE STATUS: Full Diet recommendation: As tolerated  Brief/Interim Summary: Dominique Evans a 54 y.o.femalewith medical history significant ofpsoriasis (on Humira); HTN; DM; anxiety/depression; obesity (BMI 33); TBI/cognitive impairment; chronic pain; OSA; HLD; and gastric bypass presenting with ear pain and swelling. She was previously admitted from 4/6-9 with facial cellulitis, treated with doxycycline. She has done ok, but her legs were hurting this AM upon awakening. Her temp was 100.2 orally. Her ear swelled again and got red. No known injury. She has pain extending along her posterior ear and down her neck.Recent hospitalization with facial cellulitis - Aztreonam ->doxy. Doing well until today - return of redness and swelling to R ear, T 100.2. Took Percocet,  With questionably reported fever. Has perichondritis. No good PO antibiotic option for pseudomonas coverage due to allergies  Patient admitted as above with acutely worsening right ear pain and swelling previously admitted on 01/23/2020 through 01/26/2020 with facial cellulitis having been treated with doxycycline.  Her ear began to swell again prior to admission with T-max of 100.2.  Given patient's remarkable allergy list ID was consulted who recommended placing patient on oritavancin which is a single dose antibiotic lasting for 2 weeks.  Her erythema initially was unchanged but now is markedly improving, patient indicates her pain and swelling appear to be resolving as well.  Could not rule out allergic reaction  although given its localized state less likely given patient's moderately lengthy list of antibiotics with severe reactions it was worth considering.  We gust use of topical Benadryl as well in the outpatient setting as this was initiated here although unclear if provement was due to antibiotic or topical anti-histamine.  Regardless patient is markedly improving and otherwise stable and agreeable for discharge home.  Patient's home medications remain otherwise unchanged, follow with PCP as previously scheduled.  Discharge Diagnoses:  Principal Problem:   Cellulitis of ear, right Active Problems:   Obesity, unspecified   Hypertension associated with diabetes (HCC)   Chronic pain   Hyperlipidemia   Mild neurocognitive disorder, unclear etiology   DM (diabetes mellitus), type 2 [facility])    Discharge Instructions  Discharge Instructions    Call MD for:  difficulty breathing, headache or visual disturbances   Complete by: As directed    Call MD for:  extreme fatigue   Complete by: As directed    Call MD for:  hives   Complete by: As directed    Call MD for:  persistant dizziness or light-headedness   Complete by: As directed    Call MD for:  persistant nausea and vomiting   Complete by: As directed    Call MD for:  redness, tenderness, or signs of infection (pain, swelling, redness, odor or green/yellow discharge around incision site)   Complete by: As directed    Call MD for:  severe uncontrolled pain   Complete by: As directed    Call MD for:  temperature >100.4   Complete by: As directed    Diet - low sodium heart healthy   Complete by: As directed    Increase activity slowly   Complete by: As directed  Allergies as of 02/17/2020      Reactions   Penicillins Anaphylaxis   Has patient had a PCN reaction causing immediate rash, facial/tongue/throat swelling, SOB or lightheadedness with hypotension: yes Has patient had a PCN reaction causing severe rash involving mucus membranes  or skin necrosis: unknown Has patient had a PCN reaction that required hospitalization unknown Has patient had a PCN reaction occurring within the last 10 years: no If all of the above answers are "NO", then may proceed with Cephalosporin use.   Sulfonamide Derivatives Anaphylaxis   Clindamycin/lincomycin Nausea And Vomiting   Ciprofloxacin Hives      Medication List    TAKE these medications   Advair Diskus 250-50 MCG/DOSE Aepb Generic drug: Fluticasone-Salmeterol Inhale 1 puff into the lungs 2 (two) times daily.   albuterol 108 (90 Base) MCG/ACT inhaler Commonly known as: VENTOLIN HFA Inhale 1-2 puffs into the lungs every 4 (four) hours as needed for wheezing or shortness of breath.   amitriptyline 25 MG tablet Commonly known as: ELAVIL Take 50 mg by mouth at bedtime.   Calcium 500+D3 500-400 MG-UNIT Tabs Generic drug: Calcium Carb-Cholecalciferol Take 1 tablet by mouth daily.   [facility] Digestive Probiotic 250 MG capsule Generic drug: saccharomyces boulardii Take 500 mg by mouth daily.   ferrous sulfate 325 (65 FE) MG tablet Take 325 mg by mouth daily with breakfast.   fexofenadine 180 MG tablet Commonly known as: ALLEGRA Take 180 mg by mouth daily.   furosemide 20 MG tablet Commonly known as: LASIX Take 20 mg by mouth every morning.   Humira Pen 40 MG/0.4ML Pnkt Generic drug: Adalimumab Inject 40 mg into the skin every 14 (fourteen) days.   lansoprazole 30 MG capsule Commonly known as: PREVACID TAKE 1 CAPSULE BY MOUTH TWICE A DAY BEFORE A MEAL WAIT AT LEAST 30 MINUTES BEFORE EATING What changed: See the new instructions.   losartan 50 MG tablet Commonly known as: COZAAR Take 50 mg by mouth daily.   MiraLax 17 GM/SCOOP powder Generic drug: polyethylene glycol powder Take 17 g by mouth daily.   montelukast 10 MG tablet Commonly known as: SINGULAIR Take 10 mg by mouth at bedtime.   MULTIVITAMIN PO Take 1 tablet by mouth daily.   oxyCODONE-acetaminophen  5-325 MG tablet Commonly known as: PERCOCET/ROXICET Take 1 tablet by mouth 5 (five) times daily.   potassium citrate 10 MEQ (1080 MG) SR tablet Commonly known as: UROCIT-K Take 10 mEq by mouth 3 (three) times daily with meals.   pregabalin 100 MG capsule Commonly known as: LYRICA Take 100 mg by mouth 2 (two) times daily.   Pristiq 50 MG 24 hr tablet Generic drug: desvenlafaxine Take 50 mg by mouth daily.   rosuvastatin 10 MG tablet Commonly known as: CRESTOR Take 5 mg by mouth every morning.   verapamil 240 MG 24 hr capsule Commonly known as: VERELAN PM Take 240 mg by mouth daily.       Allergies  Allergen Reactions  . Penicillins Anaphylaxis    Has patient had a PCN reaction causing immediate rash, facial/tongue/throat swelling, SOB or lightheadedness with hypotension: yes Has patient had a PCN reaction causing severe rash involving mucus membranes or skin necrosis: unknown Has patient had a PCN reaction that required hospitalization unknown Has patient had a PCN reaction occurring within the last 10 years: no If all of the above answers are "NO", then may proceed with Cephalosporin use.   . Sulfonamide Derivatives Anaphylaxis  . Clindamycin/Lincomycin Nausea And Vomiting  .  Ciprofloxacin Hives    Consultations:  Infectious disease   Procedures/Studies: DG Chest 2 View  Result Date: 02/15/2020 CLINICAL DATA:  Left-sided chest pain.  Back pain. EXAM: CHEST - 2 VIEW COMPARISON:  01/23/2020. FINDINGS: Right-sided aortic arch again noted. Heart size normal. Lungs are clear. No pleural effusion or pneumothorax. Degenerative change thoracic spine. Surgical clips upper abdomen. Degenerative change thoracic spine. IMPRESSION: No acute cardiopulmonary disease. Electronically Signed   By: Maisie Fus  Register   On: 02/15/2020 09:18   CT MAXILLOFACIAL W CONTRAST  Result Date: 01/24/2020 CLINICAL DATA:  Initial evaluation for acute facial swelling and pain, fever, cough. Recent  trauma. Is EXAM: CT MAXILLOFACIAL WITH CONTRAST TECHNIQUE: Multidetector CT imaging of the maxillofacial structures was performed with intravenous contrast. Multiplanar CT image reconstructions were also generated. CONTRAST:  12mL OMNIPAQUE IOHEXOL 300 MG/ML  SOLN COMPARISON:  None available. FINDINGS: Osseous: No acute osseous abnormality about the face. Zygomatic arches intact. No acute maxillary fracture. Pterygoid plates intact. Nasal bones intact. Mild right-to-left nasal septal deviation without fracture. Mandible intact. Mandibular condyles normally situated. No acute abnormality about the dentition. Orbits: Globes and orbital soft tissues within normal limits. No evidence for pre or postseptal cellulitis. Bony orbits intact. Sinuses: Paranasal sinuses are clear. No evidence for acute sinusitis. No hemosinus. Soft tissues: No significant soft tissue swelling or inflammatory stranding seen about the face. Salivary glands within normal limits. No acute inflammatory changes seen about the dentition. No pathologically enlarged or abnormal adenopathy within the visualized face/neck. Visualized oral cavity and oropharynx demonstrates no acute finding. Limited intracranial: Unremarkable. IMPRESSION: Negative maxillofacial CT. No inflammatory changes to suggest acute infection identified. No evidence for acute traumatic injury. Electronically Signed   By: Rise Mu M.D.   On: 01/24/2020 02:41   DG Chest Portable 1 View  Result Date: 01/23/2020 CLINICAL DATA:  Productive cough. EXAM: PORTABLE CHEST 1 VIEW COMPARISON:  Chest x-ray 06/09/2019.  CT chest 06/09/2019. FINDINGS: Right-sided aortic arch again noted. Heart size and pulmonary vascularity normal. Lungs are clear. No pleural effusion or pneumothorax. Surgical clips noted over the lower chest and upper abdomen. IMPRESSION: No acute cardiopulmonary disease. Electronically Signed   By: Maisie Fus  Register   On: 01/23/2020 11:18   DG FL GUIDED LUMBAR  PUNCTURE  Result Date: 01/24/2020 CLINICAL DATA:  Sepsis. EXAM: DIAGNOSTIC LUMBAR PUNCTURE UNDER FLUOROSCOPIC GUIDANCE FLUOROSCOPY TIME:  Fluoroscopy Time:  12 seconds Radiation Exposure Index (if provided by the fluoroscopic device): 1.3 mGy Number of Acquired Spot Images: 0 PROCEDURE: Informed consent was obtained from the patient prior to the procedure, including potential complications of headache, allergy, and pain. With the patient prone, the lower back was prepped with Betadine. 1% Lidocaine was used for local anesthesia. Lumbar puncture was performed at the L4-5 level using a 22 gauge needle with return of clear CSF with a grossly normal opening pressure. The head of the fluoro table was raise to 8 in CSF flow. 10 ml of CSF were obtained for laboratory studies. The patient tolerated the procedure well and there were no apparent complications. IMPRESSION: 1. Technically successful fluoro guided lumbar puncture with 10 cc of clear CSF sent for requested testing. Electronically Signed   By: Donzetta Kohut M.D.   On: 01/24/2020 09:58     Subjective: No acute issues or events overnight, pain and swelling appear to be improving per the patient, otherwise denies fevers, chills, nausea, vomiting, diarrhea, constipation   Discharge Exam: Vitals:   02/16/20 2123 02/17/20 0545  BP: 114/63 124/77  Pulse: (!) 55 67  Resp: 18 16  Temp: 98.1 F (36.7 C) 98.3 F (36.8 C)  SpO2: 97% 99%   Vitals:   02/16/20 0834 02/16/20 1525 02/16/20 2123 02/17/20 0545  BP:  102/60 114/63 124/77  Pulse:  72 (!) 55 67  Resp:  16 18 16   Temp:  98.3 F (36.8 C) 98.1 F (36.7 C) 98.3 F (36.8 C)  TempSrc:  Oral Oral Oral  SpO2: 99% 100% 97% 99%  Weight:      Height:        General: Pt is alert, awake, not in acute distress Cardiovascular: RRR, S1/S2 +, no rubs, no gallops Respiratory: CTA bilaterally, no wheezing, no rhonchi Abdominal: Soft, NT, ND, bowel sounds + Extremities: no edema, no  cyanosis    The results of significant diagnostics from this hospitalization (including imaging, microbiology, ancillary and laboratory) are listed below for reference.     Microbiology: Recent Results (from the past 240 hour(s))  Culture, blood (routine x 2)     Status: None (Preliminary result)   Collection Time: 02/15/20  2:43 PM   Specimen: BLOOD  Result Value Ref Range Status   Specimen Description BLOOD LEFT ANTECUBITAL  Final   Special Requests   Final    BOTTLES DRAWN AEROBIC AND ANAEROBIC Blood Culture adequate volume   Culture   Final    NO GROWTH 2 DAYS Performed at [facility], [address] [place], [place] [address]    Report Status PENDING  Incomplete  Culture, blood (routine x 2)     Status: None (Preliminary result)   Collection Time: 02/15/20  5:20 PM   Specimen: BLOOD  Result Value Ref Range Status   Specimen Description BLOOD RIGHT ANTECUBITAL  Final   Special Requests   Final    BOTTLES DRAWN AEROBIC ONLY Blood Culture adequate volume   Culture   Final    NO GROWTH 2 DAYS Performed at [facility], [place] [address], [place], [place] [address]    Report Status PENDING  Incomplete  SARS CORONAVIRUS 2 (TAT 6-24 HRS) Nasopharyngeal Nasopharyngeal Swab     Status: None   Collection Time: 02/15/20  7:01 PM   Specimen: Nasopharyngeal Swab  Result Value Ref Range Status   SARS Coronavirus 2 NEGATIVE NEGATIVE Final    Comment: (NOTE) SARS-CoV-2 target nucleic acids are NOT DETECTED. The SARS-CoV-2 RNA is generally detectable in upper and lower respiratory specimens during the acute phase of infection. Negative results do not preclude SARS-CoV-2 infection, do not rule out co-infections with other pathogens, and should not be used as the sole basis for treatment or other patient management decisions. Negative results must be combined with clinical observations, patient history, and epidemiological information. The expected result is  Negative. Fact Sheet for Patients: SugarRoll.be Fact Sheet for Healthcare Providers: https://www.woods-mathews.com/ This test is not yet approved or cleared by the [place] FDA and  has been authorized for detection and/or diagnosis of SARS-CoV-2 by FDA under an Emergency Use Authorization (EUA). This EUA will remain  in effect (meaning this test can be used) for the duration of the COVID-19 declaration under Section 56 4(b)(1) of the Act, 21 U.S.C. section 360bbb-3(b)(1), unless the authorization is terminated or revoked sooner. Performed at [facility], [place] [address] [place], [place] [address]      Labs: BNP (last 3 results) No results for input(s): BNP in the last 8760 hours. Basic Metabolic Panel: Recent Labs  Lab 02/15/20  [id number] 02/16/20 0151  NA 140 137  K 3.9 4.7  CL 102 102  CO2 27 29  GLUCOSE 100* 191*  BUN 16 15  CREATININE 1.02* 1.03*  CALCIUM 9.2 9.1   Liver Function Tests: No results for input(s): AST, ALT, ALKPHOS, BILITOT, PROT, ALBUMIN in the last 168 hours. No results for input(s): LIPASE, AMYLASE in the last 168 hours. No results for input(s): AMMONIA in the last 168 hours. CBC: Recent Labs  Lab 02/15/20 0852 02/16/20 0151  WBC 5.6 4.3  HGB 12.2 12.1  HCT 38.8 37.4  MCV 89.2 89.3  PLT 202 170   Cardiac Enzymes: No results for input(s): CKTOTAL, CKMB, CKMBINDEX, TROPONINI in the last 168 hours. BNP: Invalid input(s): POCBNP CBG: No results for input(s): GLUCAP in the last 168 hours. D-Dimer No results for input(s): DDIMER in the last 72 hours. Hgb A1c No results for input(s): HGBA1C in the last 72 hours. Lipid Profile No results for input(s): CHOL, HDL, LDLCALC, TRIG, CHOLHDL, LDLDIRECT in the last 72 hours. Thyroid function studies No results for input(s): TSH, T4TOTAL, T3FREE, THYROIDAB in the last 72 hours.  Invalid input(s): FREET3 Anemia work up No results for input(s):  VITAMINB12, FOLATE, FERRITIN, TIBC, IRON, RETICCTPCT in the last 72 hours. Urinalysis    Component Value Date/Time   COLORURINE YELLOW 01/23/2020 1206   APPEARANCEUR CLEAR 01/23/2020 1206   LABSPEC 1.008 01/23/2020 1206   PHURINE 7.0 01/23/2020 1206   GLUCOSEU NEGATIVE 01/23/2020 1206   HGBUR NEGATIVE 01/23/2020 1206   BILIRUBINUR NEGATIVE 01/23/2020 1206   KETONESUR NEGATIVE 01/23/2020 1206   PROTEINUR NEGATIVE 01/23/2020 1206   NITRITE NEGATIVE 01/23/2020 1206   LEUKOCYTESUR NEGATIVE 01/23/2020 1206   Sepsis Labs Invalid input(s): PROCALCITONIN,  WBC,  LACTICIDVEN Microbiology Recent Results (from the past 240 hour(s))  Culture, blood (routine x 2)     Status: None (Preliminary result)   Collection Time: 02/15/20  2:43 PM   Specimen: BLOOD  Result Value Ref Range Status   Specimen Description BLOOD LEFT ANTECUBITAL  Final   Special Requests   Final    BOTTLES DRAWN AEROBIC AND ANAEROBIC Blood Culture adequate volume   Culture   Final    NO GROWTH 2 DAYS Performed at [facility], [address] [place], [place] [address]    Report Status PENDING  Incomplete  Culture, blood (routine x 2)     Status: None (Preliminary result)   Collection Time: 02/15/20  5:20 PM   Specimen: BLOOD  Result Value Ref Range Status   Specimen Description BLOOD RIGHT ANTECUBITAL  Final   Special Requests   Final    BOTTLES DRAWN AEROBIC ONLY Blood Culture adequate volume   Culture   Final    NO GROWTH 2 DAYS Performed at [facility], [address] [place], [place] [address]    Report Status PENDING  Incomplete  SARS CORONAVIRUS 2 (TAT 6-24 HRS) Nasopharyngeal Nasopharyngeal Swab     Status: None   Collection Time: 02/15/20  7:01 PM   Specimen: Nasopharyngeal Swab  Result Value Ref Range Status   SARS Coronavirus 2 NEGATIVE NEGATIVE Final    Comment: (NOTE) SARS-CoV-2 target nucleic acids are NOT DETECTED. The SARS-CoV-2 RNA is generally detectable in upper and  lower respiratory specimens during the acute phase of infection. Negative results do not preclude SARS-CoV-2 infection, do not rule out co-infections with other pathogens, and should not be used as the sole basis for treatment or other patient management decisions.  Negative results must be combined with clinical observations, patient history, and epidemiological information. The expected result is Negative. Fact Sheet for Patients: HairSlick.no Fact Sheet for Healthcare Providers: quierodirigir.com This test is not yet approved or cleared by the [place] FDA and  has been authorized for detection and/or diagnosis of SARS-CoV-2 by FDA under an Emergency Use Authorization (EUA). This EUA will remain  in effect (meaning this test can be used) for the duration of the COVID-19 declaration under Section 56 4(b)(1) of the Act, 21 U.S.C. section 360bbb-3(b)(1), unless the authorization is terminated or revoked sooner. Performed at [facility], [address] [place], [place] [address]      Time coordinating discharge: Over 30 minutes  SIGNED:   Azucena Fallen, DO [facility] 02/17/2020, 3:21 PM Pager   If 7PM-7AM, please contact night-coverage [email or web address]

## 2020-02-20 ENCOUNTER — Other Ambulatory Visit (HOSPITAL_COMMUNITY): Payer: Self-pay | Admitting: Internal Medicine

## 2020-02-20 DIAGNOSIS — M79604 Pain in right leg: Secondary | ICD-10-CM

## 2020-02-20 DIAGNOSIS — M79605 Pain in left leg: Secondary | ICD-10-CM

## 2020-02-20 LAB — CULTURE, BLOOD (ROUTINE X 2)
Culture: NO GROWTH
Culture: NO GROWTH
Special Requests: ADEQUATE
Special Requests: ADEQUATE

## 2020-02-21 ENCOUNTER — Ambulatory Visit (HOSPITAL_COMMUNITY)
Admission: RE | Admit: 2020-02-21 | Discharge: 2020-02-21 | Disposition: A | Discharge status: Home / Self Care | Payer: 59 | Source: Ambulatory Visit | Attending: Vascular Surgery | Admitting: Vascular Surgery

## 2020-02-21 ENCOUNTER — Other Ambulatory Visit: Payer: Self-pay

## 2020-02-21 DIAGNOSIS — M79605 Pain in left leg: Secondary | ICD-10-CM

## 2020-02-21 DIAGNOSIS — M79604 Pain in right leg: Secondary | ICD-10-CM | POA: Diagnosis not present

## 2020-02-25 ENCOUNTER — Other Ambulatory Visit: Payer: Self-pay | Admitting: Gastroenterology

## 2020-03-05 ENCOUNTER — Telehealth: Payer: Self-pay | Admitting: Diagnostic Neuroimaging

## 2020-03-15 ENCOUNTER — Encounter: Payer: Self-pay | Admitting: Podiatry

## 2020-03-15 ENCOUNTER — Ambulatory Visit (INDEPENDENT_AMBULATORY_CARE_PROVIDER_SITE_OTHER): Payer: No Typology Code available for payment source | Admitting: Podiatry

## 2020-03-15 ENCOUNTER — Other Ambulatory Visit: Payer: Self-pay

## 2020-03-15 DIAGNOSIS — E0843 Diabetes mellitus due to underlying condition with diabetic autonomic (poly)neuropathy: Secondary | ICD-10-CM

## 2020-03-15 DIAGNOSIS — B351 Tinea unguium: Secondary | ICD-10-CM

## 2020-03-15 DIAGNOSIS — M79675 Pain in left toe(s): Secondary | ICD-10-CM

## 2020-03-15 DIAGNOSIS — L989 Disorder of the skin and subcutaneous tissue, unspecified: Secondary | ICD-10-CM

## 2020-03-15 DIAGNOSIS — M79674 Pain in right toe(s): Secondary | ICD-10-CM

## 2020-03-15 NOTE — Progress Notes (Signed)

## 2020-03-19 ENCOUNTER — Other Ambulatory Visit: Payer: Self-pay | Admitting: Gastroenterology

## 2020-03-25 ENCOUNTER — Telehealth: Payer: Self-pay | Admitting: Nurse Practitioner

## 2020-03-25 MED ORDER — LANSOPRAZOLE 30 MG PO CPDR: DELAYED_RELEASE_CAPSULE | ORAL | 1 refills | Status: DC

## 2020-03-25 NOTE — Telephone Encounter (Signed)
Patient called for [facility]ill [facility] to [facility] [address] instead please

## 2020-03-25 NOTE — Telephone Encounter (Signed)
[facility] [address]. Informed patient that I sent the prescription to that pharmacy on 03/19/20. Patient verbalized understanding.

## 2020-04-11 ENCOUNTER — Other Ambulatory Visit: Payer: Self-pay | Admitting: Gastroenterology

## 2020-04-23 ENCOUNTER — Encounter: Payer: Self-pay | Admitting: Diagnostic Neuroimaging

## 2020-04-23 ENCOUNTER — Ambulatory Visit (INDEPENDENT_AMBULATORY_CARE_PROVIDER_SITE_OTHER): Payer: 59 | Admitting: Diagnostic Neuroimaging

## 2020-04-23 ENCOUNTER — Other Ambulatory Visit: Payer: Self-pay

## 2020-04-23 VITALS — BP 115/74 | HR 84 | Ht 65.0 in | Wt 212.0 lb

## 2020-04-23 DIAGNOSIS — R269 Unspecified abnormalities of gait and mobility: Secondary | ICD-10-CM

## 2020-04-23 DIAGNOSIS — G8929 Other chronic pain: Secondary | ICD-10-CM

## 2020-04-23 DIAGNOSIS — G3184 Mild cognitive impairment, so stated: Secondary | ICD-10-CM

## 2020-04-23 DIAGNOSIS — R413 Other amnesia: Secondary | ICD-10-CM | POA: Diagnosis not present

## 2020-04-23 DIAGNOSIS — E1142 Type 2 diabetes mellitus with diabetic polyneuropathy: Secondary | ICD-10-CM

## 2020-04-23 DIAGNOSIS — M5442 Lumbago with sciatica, left side: Secondary | ICD-10-CM | POA: Diagnosis not present
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
## 2020-05-03 ENCOUNTER — Emergency Department (HOSPITAL_COMMUNITY)
Admission: EM | Admit: 2020-05-03 | Discharge: 2020-05-03 | Disposition: A | Discharge status: Home / Self Care | Payer: No Typology Code available for payment source | Attending: Emergency Medicine | Admitting: Emergency Medicine

## 2020-05-03 ENCOUNTER — Other Ambulatory Visit: Payer: Self-pay

## 2020-05-03 ENCOUNTER — Encounter (HOSPITAL_COMMUNITY): Payer: Self-pay

## 2020-05-03 ENCOUNTER — Emergency Department (HOSPITAL_COMMUNITY): Payer: No Typology Code available for payment source

## 2020-05-03 DIAGNOSIS — J45909 Unspecified asthma, uncomplicated: Secondary | ICD-10-CM | POA: Insufficient documentation

## 2020-05-03 DIAGNOSIS — E119 Type 2 diabetes mellitus without complications: Secondary | ICD-10-CM | POA: Diagnosis not present

## 2020-05-03 DIAGNOSIS — Z87891 Personal history of nicotine dependence: Secondary | ICD-10-CM | POA: Diagnosis not present

## 2020-05-03 DIAGNOSIS — R519 Headache, unspecified: Secondary | ICD-10-CM | POA: Diagnosis not present

## 2020-05-03 DIAGNOSIS — M25561 Pain in right knee: Secondary | ICD-10-CM | POA: Diagnosis not present

## 2020-05-03 DIAGNOSIS — W19XXXA Unspecified fall, initial encounter: Secondary | ICD-10-CM

## 2020-05-03 DIAGNOSIS — Z79899 Other long term (current) drug therapy: Secondary | ICD-10-CM | POA: Diagnosis not present

## 2020-05-03 DIAGNOSIS — I10 Essential (primary) hypertension: Secondary | ICD-10-CM | POA: Diagnosis not present

## 2020-05-03 MED ORDER — MORPHINE SULFATE (PF) 4 MG/ML IV SOLN
4.0000 mg | Freq: Once | INTRAVENOUS | Status: AC
  Administered 2020-05-03: 4 mg via INTRAMUSCULAR
  Filled 2020-05-03: qty 1

## 2020-05-03 NOTE — Discharge Instructions (Addendum)
Your physical exam is reassuring.  Low suspicion for intracranial bleed or other abnormalities.  Plain films obtained of your right knee demonstrated arthritic changes, but no acute injuries or bony abnormalities.  Given your persistent, frequent falls, you need to start ambulating with a walker.  This was also encouraged by your neurologist.  I have attached printed prescriptions for dispensing a walker.  There are several DME stores here in [place], [place] (such as [employer] off of [address]) where you can pick up a walker with a prescription.  Your son should be able to assist you as I know that you cannot drive.    Please continue to take your at home pain medications, as needed.  Return to the ED or seek immediate medical attention to experience any new or worsening symptoms.

## 2020-05-03 NOTE — ED Triage Notes (Signed)
Pt arrives to ED w/ c/o fall. Pt states she does not take any blood thinners. Pt states she tripped and hit head on floor. Pt c/o 8/10 R sided head pain. Pt denies loc, remembers the events of the fall. Pt states she did vomit today but does not know if that was before or after the fall. Pt AOx4, neuro intact.

## 2020-05-03 NOTE — ED Provider Notes (Signed)
[facility] EMERGENCY DEPARTMENT Provider Note   CSN: [id number] Arrival date & time: 05/03/20  1122     History Chief Complaint  Patient presents with   Dominique Evans    Dominique Evans is a 54 y.o. female with past medical history of psoriasis, DM with polyneuropathy, HTN, HLD, mild neurocognitive disorder, memory loss, gait difficulties, and chronic pain syndrome presents the ED after sustaining mechanical fall at home that occurred this morning.  Patient reports that she typically ambulates with a cane, however she attempted to ambulate to the bathroom without one and lost balance after her pet tripped her.  She complains of headache, head trauma, and right knee pain.  She is pointing towards her right temple area, but there is no evidence of trauma or palpable skull defects.  She denies any bleeding.  When asked about medications, patient pulled out a long list.  She states that she has been on oxycodone 5 times daily for over 10 years subsequent to the mechanical fall at home in which her left heel struck her in the back of her head.  Patient denies any LOC, memory disturbance, blood thinners, neck pain, nausea, blurred vision, new numbness or weakness, or other new neurologic deficits.  She endorses chronic numbness in her lower extremities due to her diabetic neuropathy.  Patient has been ambulatory since her fall.  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   Diabetic neuropathy (HCC) 09/22/2019   Esophageal dysmotility 03/01/2015   Esophageal dysphagia 06/10/2019   GERD (gastroesophageal reflux disease)    Head injury due to  trauma    age 66, went under car while sledding, had memory loss at that time   HNP (herniated nucleus pulposus), lumbar 11/30/2012   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   Hx of hiatal hernia    Hydatidiform mole    Hyperlipidemia    Lateral ventral hernia 03/01/2015   Major depressive disorder with single episode 09/24/2018   Mild neurocognitive disorder, unclear etiology 10/25/2019   Nephrolithiasis    left kidney removed due to  calcification    Obesity    Obstructive sleep apnea 10/31/2007   Qualifier: Diagnosis of  By: Craige Cotta MD, Vineet     Panic attacks    PONV (postoperative nausea and vomiting)    Psoriasis    Status post dilation of esophageal narrowing    Type 2 diabetes mellitus with foot ulcer (HCC) 10/04/2007   Qualifier: Diagnosis of  By: Craige Cotta MD, Vineet     Unspecified essential hypertension    Visual disturbance     Patient Active Problem List   Diagnosis Date Noted   Cellulitis of ear, right 02/15/2020   DM (diabetes mellitus), type 2 (HCC) 01/24/2020   SIRS (systemic inflammatory response syndrome) (HCC) 01/24/2020   Facial cellulitis 01/24/2020   Mild neurocognitive disorder, unclear etiology 10/25/2019   Pain due to onychomycosis of toenails of both feet 09/22/2019   Diabetic neuropathy (HCC) 09/22/2019   Pre-ulcerative corn or callous 09/22/2019   Esophageal dysphagia 06/10/2019   Intractable  nausea and vomiting 06/09/2019   Cellulitis of left foot 04/27/2019   Hyperlipidemia 02/16/2019   Chronic pain 09/24/2018   Incisional hernia 09/24/2018   Low back pain 09/24/2018   Major depressive disorder with single episode 09/24/2018   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   Esophageal dysmotility 03/01/2015   Lateral ventral hernia 03/01/2015   S/P lumbar spinal fusion 05/23/2014   Edema 06/28/2013   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   HNP (herniated nucleus pulposus), lumbar 11/30/2012   Thoracic aortic aneurysm 10/23/2010    Constipation, chronic 03/23/2010   Obesity, unspecified 10/31/2007   Obstructive sleep apnea 10/31/2007   Chronic rhinitis 10/31/2007   Cough variant asthma 10/31/2007   GERD (gastroesophageal reflux disease) 10/31/2007   Type 2 diabetes mellitus with foot ulcer (HCC) 10/04/2007   Hypertension associated with diabetes (HCC) 10/04/2007   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/04/2007    Past Surgical History:  Procedure Laterality Date   BIOPSY  06/11/2019   Procedure: BIOPSY;  Surgeon: Charna Elizabeth, MD;  Location: [facility] ENDOSCOPY;  Service: Endoscopy;;   BOTOX INJECTION N/A 06/07/2015   Procedure: BOTOX INJECTION;  Surgeon: Louis Meckel, MD;  Location: [facility] ENDOSCOPY;  Service: Endoscopy;  Laterality: N/A;   COLONOSCOPY WITH PROPOFOL N/A 06/07/2015   Procedure: COLONOSCOPY WITH PROPOFOL;  Surgeon: Louis Meckel, MD;  Location: [facility] ENDOSCOPY;  Service: Endoscopy;  Laterality: N/A;   ELBOW FRACTURE SURGERY     left   ESOPHAGEAL MANOMETRY N/A 04/08/2015   Procedure: ESOPHAGEAL MANOMETRY (EM);  Surgeon: Louis Meckel, MD;  Location: [facility] ENDOSCOPY;  Service: Endoscopy;  Laterality: N/A;   ESOPHAGOGASTRODUODENOSCOPY (EGD) WITH PROPOFOL N/A 06/07/2015   Procedure: ESOPHAGOGASTRODUODENOSCOPY (EGD) WITH PROPOFOL;  Surgeon: Louis Meckel, MD;  Location: [facility] ENDOSCOPY;  Service: Endoscopy;  Laterality: N/A;   ESOPHAGOGASTRODUODENOSCOPY (EGD) WITH PROPOFOL Left 06/11/2019   Procedure: ESOPHAGOGASTRODUODENOSCOPY (EGD) WITH PROPOFOL;  Surgeon: Charna Elizabeth, MD;  Location: [facility] ENDOSCOPY;  Service: Endoscopy;  Laterality: Left;   GASTRIC ROUX-EN-Y  10/05/2011   Procedure: LAPAROSCOPIC ROUX-EN-Y GASTRIC;  Surgeon: Mariella Saa, MD;  Location: [facility] ORS;  Service: General;  Laterality: N/A;  UPPER ENDOSCOPY   lap band removal      LAPAROSCOPIC GASTRIC BANDING  02/2006   w/ truncal vagotomy   LUMBAR LAMINECTOMY/DECOMPRESSION MICRODISCECTOMY Left 11/30/2012   Procedure:  MICRO LUMBAR DECOMPRESSION L4-5 ON THE LEFT;  Surgeon: Javier Docker, MD;  Location: [facility] ORS;  Service: Orthopedics;  Laterality: Left;  MICRO LUMBAR DECOMPRESSION L4-5 ON THE LEFT   LUMBAR LAMINECTOMY/DECOMPRESSION MICRODISCECTOMY N/A 03/30/2013   Procedure: REDO MICRO LUMBAR DECOMPRESSION L4-5;  Surgeon: Javier Docker, MD;  Location: [facility] ORS;  Service: Orthopedics;  Laterality: N/A;   MAXIMUM ACCESS (MAS)POSTERIOR LUMBAR INTERBODY FUSION (PLIF) 1 LEVEL N/A 05/23/2014   Procedure: FOR MAXIMUM ACCESS (MAS) POSTERIOR LUMBAR INTERBODY FUSION (PLIF) Lumbar Four-Five;  Surgeon: Tia Alert, MD;  Location: [facility] NEURO ORS;  Service: Neurosurgery;  Laterality: N/A;  FOR MAXIMUM ACCESS (MAS) POSTERIOR LUMBAR INTERBODY FUSION (PLIF) Lumbar Four-Five   NEPHRECTOMY  2002   left   PATENT DUCTUS ARTERIOUS REPAIR     SHOULDER ARTHROSCOPY     right shoulder     OB History   No obstetric history on file.     Family History  Problem Relation Age of Onset   Hypertension Father    Skin cancer Father    Heart disease Father        before age 64  Heart attack Father    Hyperlipidemia Father    Diabetes Mother    Hypertension Mother    Skin cancer Mother    Breast cancer Mother        Melanoma   Heart disease Mother    Hyperlipidemia Mother    Lung cancer Mother    Brain cancer Mother    Hypertension Sister    Heart disease Sister        before age 79   Heart attack Sister    Hyperlipidemia Sister    Breast cancer Maternal Aunt    Colon cancer Neg Hx    Colon polyps Neg Hx    Esophageal cancer Neg Hx    Gallbladder disease Neg Hx    Stomach cancer Neg Hx     Social History   Tobacco Use   Smoking status: Former Smoker    Packs/day: 2.00    Types: Cigarettes    Quit date: 10/19/1982    Years since quitting: 37.5   Smokeless tobacco: Never Used  Vaping Use   Vaping Use: Never used  Substance Use Topics   Alcohol use: No    Alcohol/week: 0.0 standard  drinks   Drug use: No    Home Medications Prior to Admission medications   Medication Sig Start Date End Date Taking? Authorizing Provider  acetaminophen (TYLENOL) 500 MG tablet Take 500 mg by mouth in the morning and at bedtime.   Yes [provider]  amitriptyline (ELAVIL) 25 MG tablet Take 50 mg by mouth at bedtime.    Yes [provider]  Calcium Carb-Cholecalciferol (CALCIUM 500+D3) 500-400 MG-UNIT TABS Take 1 tablet by mouth daily.   Yes [provider]  desvenlafaxine (PRISTIQ) 50 MG 24 hr tablet Take 50 mg by mouth daily.   Yes [provider]  ferrous sulfate 325 (65 FE) MG tablet Take 325 mg by mouth daily with breakfast.   Yes [provider]  fexofenadine (ALLEGRA) 180 MG tablet Take 180 mg by mouth daily.   Yes [provider]  Fluticasone-Salmeterol (ADVAIR DISKUS) 250-50 MCG/DOSE AEPB Inhale 1 puff into the lungs 2 (two) times daily.    Yes [provider]  furosemide (LASIX) 20 MG tablet Take 20 mg by mouth every morning.    Yes [provider]  HUMIRA PEN 40 MG/0.4ML PNKT Inject 40 mg into the skin every 14 (fourteen) days. 01/05/20  Yes [provider]  lansoprazole (PREVACID) 30 MG capsule TAKE 1 CAPSULE BY MOUTH TWICE A DAY BEFORE MEAL *NEED OFFICE VISIT FOR MORE REFILLS* Patient taking differently: Take 30 mg by mouth in the morning and at bedtime.  04/11/20  Yes Nandigam, Eleonore Chiquito, MD  losartan (COZAAR) 50 MG tablet Take 50 mg by mouth daily. 02/07/20  Yes [provider]  montelukast (SINGULAIR) 10 MG tablet Take 10 mg by mouth at bedtime.    Yes [provider]  Multiple Vitamins-Minerals (MULTIVITAMIN PO) Take 1 tablet by mouth daily.    Yes [provider]  oxyCODONE-acetaminophen (PERCOCET/ROXICET) 5-325 MG tablet Take 1 tablet by mouth 5 (five) times daily.    Yes [provider]  polyethylene glycol powder (MIRALAX) 17 GM/SCOOP powder Take 17 g by  mouth daily.    Yes [provider]  potassium citrate (UROCIT-K) 10 MEQ (1080 MG) SR tablet Take 10 mEq by mouth 3 (three) times daily with meals.    Yes [provider]  pregabalin (LYRICA) 100 MG capsule Take 100 mg by  mouth 2 (two) times daily. 01/11/20  Yes [provider]  rosuvastatin (CRESTOR) 10 MG tablet Take 5 mg by mouth every morning.    Yes [provider]  verapamil (VERELAN PM) 240 MG 24 hr capsule Take 240 mg by mouth daily. 01/02/20  Yes [provider]  pantoprazole (PROTONIX) 20 MG tablet Take 1 tablet (20 mg total) by mouth daily. 06/09/19 06/09/19  Albrizze, Kaitlyn E, PA-C    Allergies    Penicillins, Sulfonamide derivatives, Clindamycin/lincomycin, Sulfa antibiotics, and Ciprofloxacin  Review of Systems   Review of Systems  All other systems reviewed and are negative.   Physical Exam Updated Vital Signs BP 127/76 (BP Location: Right Arm)    Pulse 80    Temp 98.7 F (37.1 C) (Oral)    Resp 16    Ht 5\' 6"  (1.676 m)    Wt 94.8 kg    LMP 12/21/2015    SpO2 100%    BMI 33.73 kg/m   Physical Exam Vitals and nursing note reviewed. Exam conducted with a chaperone present.  Constitutional:      Appearance: Normal appearance.  HENT:     Head:     Comments: Normocephalic.  Tender over right side of head near frontal-temporal region.  No palpable skull defects.  No ecchymoses, swelling, or other overlying skin changes.  No periorbital edema or swelling.  No battle sign or hemotympanum.  No raccoon eyes.    Ears:     Comments: Mild redness inferior to and involving external right ear (subacute).  Residual from perichondritis.   Eyes:     General: No scleral icterus.    Conjunctiva/sclera: Conjunctivae normal.     Comments: PERRL and EOM intact.  No entrapment.  No periorbital edema or other overlying skin changes.  Neck:     Comments: ROM fully intact.  No cervical midline tenderness to palpation. Cardiovascular:     Rate and  Rhythm: Normal rate and regular rhythm.     Pulses: Normal pulses.     Heart sounds: Normal heart sounds.  Pulmonary:     Effort: Pulmonary effort is normal. No respiratory distress.     Breath sounds: Normal breath sounds.  Musculoskeletal:     Cervical back: Normal range of motion and neck supple.     Comments: Right knee: Tender throughout.  Mild ecchymoses of unknown chronicity over medial aspect of right knee.  She is able to demonstrate ROM and strength, albeit limited slightly due to her symptoms of discomfort.  No obvious joint effusion.  Symmetric when compared to left side. Right hip: Normal. Right ankle: Normal.  Pedal pulse intact.  Skin:    General: Skin is dry.     Comments: Scaly lesions throughout body diffusely (psoriasis).  Neurological:     Mental Status: She is alert.     GCS: GCS eye subscore is 4. GCS verbal subscore is 5. GCS motor subscore is 6.     Comments: CN II to XII grossly intact.  Can move all extremities.  Sensation intact throughout except for feet and ankles bilaterally (chronic).  Able to ambulate short distance while in the room with assistance of a cane.  Negative Romberg and cerebellar exams.  No obvious focal deficits.  Psychiatric:        Mood and Affect: Mood normal.        Behavior: Behavior normal.        Thought Content: Thought content normal.     ED Results /  Procedures / Treatments   Labs (all labs ordered are listed, but only abnormal results are displayed) Labs Reviewed - No data to display  EKG None  Radiology DG Knee Complete 4 Views Right  Result Date: 05/03/2020 CLINICAL DATA:  Right knee pain after fall. EXAM: RIGHT KNEE - COMPLETE 4+ VIEW COMPARISON:  None. FINDINGS: No evidence of fracture, dislocation, or joint effusion. No significant joint space narrowing is noted. Mild osteophyte formation is noted laterally. Soft tissues are unremarkable. IMPRESSION: Mild degenerative changes are noted. No acute abnormality seen in the  right knee. Electronically Signed   By: Lupita RaiderJames  Jerae Izard Jr M.D.   On: 05/03/2020 15:10    Procedures Procedures (including critical care time)  Medications Ordered in ED Medications  morphine 4 MG/ML injection 4 mg (4 mg Intramuscular Given 05/03/20 1506)    ED Course  I have reviewed the triage vital signs and the nursing notes.  Pertinent labs & imaging results that were available during my care of the patient were reviewed by me and considered in my medical decision making (see chart for details).    MDM Rules/Calculators/A&P                          Patient presents the ED after sustaining mechanical fall at home.  [facility] and recent note from 04/23/2020 recommended that she ambulate at home with use of a walker.  On my examination, she states that she was unable to obtain a walker and "does not know where to get one".  She has been attempting to ambulate with a cane, however she endorses frequent falls and states that this is a persistent issue for her.  She states that she now lives with her son and does not drive due to her neurocognitive issues.  It is also obviously important that she abstain from driving given the fact that she takes 5 oxycodone daily due to her chronic pain symptoms.  I recommended that she follow-up with her neurologist regarding today's encounter.  Will obtain plain films to evaluate for injury of right knee.  I will also hand write her a prescription for dispensing 1 DME walker.    Her physical exam is largely reassuring.  While she has tenderness involving her right knee, ROM and strength are largely intact, albeit mildly limited due to her reported pain symptoms.  Her head appears to be entirely atraumatic.  No evidence of injury.  Do not feel as though CT is warranted.  She is negative per [place] head CT risk stratification scoring.  Her neuro exam is largely unremarkable.  Patient is opiate tolerant so we will administer 4  mg IM morphine here in the ED for pain.    DG right knee complete is personally reviewed and demonstrates mild degenerative changes, but no acute osseous abnormalities.  Given patient's reported discomfort and instability, offered a knee sleeve, but patient declined.  Will encourage conservative management strategies and ask that she follow up with her primary care provider for ongoing evaluation and management.   Final Clinical Impression(s) / ED Diagnoses Final diagnoses:  Fall, initial encounter    Rx / DC Orders ED Discharge Orders         Ordered    For home use only DME Walker     Discontinue  Reprint     05/03/20 1437           Lorelee NewGreen, Rebekah Sprinkle L, PA-C  05/03/20 1538    Eber Hong, MD 05/05/20 [phone]

## 2020-05-06 ENCOUNTER — Telehealth: Payer: Self-pay

## 2020-05-06 NOTE — Telephone Encounter (Signed)
Pt called office requesting a letter from Dr. Marjory Lies stating that she needs help with daily tasks due to memory. Pt's family has contacted a Clinical research associate for disability but they can bypass a lawyer if they can get a letter that states that she needs help with daily tasks, work, etc.  Please call pt or son Dominique Evans to discuss.  Call either [phone] or [phone].

## 2020-05-06 NOTE — Telephone Encounter (Signed)
Ok. Please get more information for details of letter. -VRP

## 2020-05-07 ENCOUNTER — Encounter: Payer: Self-pay | Admitting: *Deleted

## 2020-05-07 NOTE — Telephone Encounter (Addendum)
Spoke with Dominique Evans and advised the letter will be written, and advised Dr Marjory Lies recommends he get notes from her neuropsych testing. I gave him [name] neurology #. He asked that letter be mailed to him, verbalized understanding, appreciation. Letter on MD's desk for signature.

## 2020-05-07 NOTE — Telephone Encounter (Signed)
Agree. Also send neuropsych testing notes. -VRP

## 2020-05-07 NOTE — Telephone Encounter (Addendum)
Called son, Samuel Bouche for details. He is trying to get disability for her and consulted a Clinical research associate. Lawyer advised if  a medical professional would write a letter stating she is incapable of performing her daily tasks such as personal hygiene,  meals,  handling phone calls, dr appointments without reminders they may be able to get disability approved without lawyer services. Samuel Bouche added she is not to drive; he drives her where she needs to go. She and Samuel Bouche work from home 8 am- 4:30 pm, and he has to help her with her work because she can no longer remember what to do. He has been in touch with her employer re: short term disability from them until she is approved for disability. I advised will discuss iwht Dr Marjory Lies and let him know. Samuel Bouche  verbalized understanding, appreciation.

## 2020-05-07 NOTE — Telephone Encounter (Signed)
Letter signed and put in mail to patient.

## 2020-06-19 ENCOUNTER — Encounter: Payer: Self-pay | Admitting: Podiatry

## 2020-06-19 ENCOUNTER — Ambulatory Visit (INDEPENDENT_AMBULATORY_CARE_PROVIDER_SITE_OTHER): Payer: 59 | Admitting: Podiatry

## 2020-06-19 ENCOUNTER — Other Ambulatory Visit: Payer: Self-pay

## 2020-06-19 ENCOUNTER — Telehealth: Payer: Self-pay | Admitting: *Deleted

## 2020-06-19 DIAGNOSIS — E0843 Diabetes mellitus due to underlying condition with diabetic autonomic (poly)neuropathy: Secondary | ICD-10-CM

## 2020-06-19 DIAGNOSIS — M79675 Pain in left toe(s): Secondary | ICD-10-CM | POA: Diagnosis not present

## 2020-06-19 DIAGNOSIS — B351 Tinea unguium: Secondary | ICD-10-CM

## 2020-06-19 DIAGNOSIS — M79674 Pain in right toe(s): Secondary | ICD-10-CM | POA: Diagnosis not present

## 2020-06-19 NOTE — Telephone Encounter (Signed)
Received [employer] STD papers for patient. Placed on MD's desk for review, completion, signature.

## 2020-06-19 NOTE — Progress Notes (Signed)

## 2020-06-20 DIAGNOSIS — Z0289 Encounter for other administrative examinations: Secondary | ICD-10-CM

## 2020-06-20 NOTE — Telephone Encounter (Addendum)
Received call back from patient who stated she "never told us she stopped working".  I advised her it may have been a misunderstanding. She stated she is able to do some of her work,  but not other tasks. Her PCP and Pain management  MDs have told her she shouldn't work. I advised she have both those MD's fill out her STD as well. She verbalized understanding, appreciation.

## 2020-06-20 NOTE — Telephone Encounter (Addendum)
[employer] STD papers completed, signed. Called her son Samuel Bouche and advised STD papers completed,  being sent to MR pr processing, inquired when she stopped working. He stated she is still working. He verbalized understanding, appreciation. Papers sent to medical records.

## 2020-06-21 ENCOUNTER — Other Ambulatory Visit: Payer: Self-pay | Admitting: Podiatry

## 2020-06-21 DIAGNOSIS — L989 Disorder of the skin and subcutaneous tissue, unspecified: Secondary | ICD-10-CM

## 2020-06-21 DIAGNOSIS — L03032 Cellulitis of left toe: Secondary | ICD-10-CM

## 2020-06-26 ENCOUNTER — Telehealth: Payer: Self-pay | Admitting: *Deleted

## 2020-06-26 ENCOUNTER — Ambulatory Visit (INDEPENDENT_AMBULATORY_CARE_PROVIDER_SITE_OTHER): Payer: No Typology Code available for payment source | Admitting: Gastroenterology

## 2020-06-26 ENCOUNTER — Encounter: Payer: Self-pay | Admitting: Gastroenterology

## 2020-06-26 VITALS — BP 120/74 | HR 91 | Ht 65.0 in | Wt 206.2 lb

## 2020-06-26 DIAGNOSIS — K5904 Chronic idiopathic constipation: Secondary | ICD-10-CM

## 2020-06-26 DIAGNOSIS — K2100 Gastro-esophageal reflux disease with esophagitis, without bleeding: Secondary | ICD-10-CM | POA: Diagnosis not present

## 2020-06-26 DIAGNOSIS — R1110 Vomiting, unspecified: Secondary | ICD-10-CM | POA: Diagnosis not present

## 2020-06-26 DIAGNOSIS — R112 Nausea with vomiting, unspecified: Secondary | ICD-10-CM | POA: Diagnosis not present

## 2020-06-26 DIAGNOSIS — Z9884 Bariatric surgery status: Secondary | ICD-10-CM

## 2020-06-26 NOTE — Telephone Encounter (Signed)
Pt form @ front desk for p/u 

## 2020-06-26 NOTE — Patient Instructions (Signed)
Follow up in 4-6 months, Make sure you have a family member with you for this appointment  Your colonoscopy recall for screening will be in March of 2022  Take Prevacid 30 mg daily before breakfast  Take Pepcid 20 mg daily after dinner before bedtime  Continue taking miralax daily   Gastroesophageal Reflux Disease, Adult Gastroesophageal reflux (GER) happens when acid from the stomach flows up into the tube that connects the mouth and the stomach (esophagus). Normally, food travels down the esophagus and stays in the stomach to be digested. However, when a person has GER, food and stomach acid sometimes move back up into the esophagus. If this becomes a more serious problem, the person may be diagnosed with a disease called gastroesophageal reflux disease (GERD). GERD occurs when the reflux:  Happens often.  Causes frequent or severe symptoms.  Causes problems such as damage to the esophagus. When stomach acid comes in contact with the esophagus, the acid may cause soreness (inflammation) in the esophagus. Over time, GERD may create small holes (ulcers) in the lining of the esophagus. What are the causes? This condition is caused by a problem with the muscle between the esophagus and the stomach (lower esophageal sphincter, or LES). Normally, the LES muscle closes after food passes through the esophagus to the stomach. When the LES is weakened or abnormal, it does not close properly, and that allows food and stomach acid to go back up into the esophagus. The LES can be weakened by certain dietary substances, medicines, and medical conditions, including:  Tobacco use.  Pregnancy.  Having a hiatal hernia.  Alcohol use.  Certain foods and beverages, such as coffee, chocolate, onions, and peppermint. What increases the risk? You are more likely to develop this condition if you:  Have an increased body weight.  Have a connective tissue disorder.  Use NSAID medicines. What are the  signs or symptoms? Symptoms of this condition include:  Heartburn.  Difficult or painful swallowing.  The feeling of having a lump in the throat.  Abitter taste in the mouth.  Bad breath.  Having a large amount of saliva.  Having an upset or bloated stomach.  Belching.  Chest pain. Different conditions can cause chest pain. Make sure you see your health care provider if you experience chest pain.  Shortness of breath or wheezing.  Ongoing (chronic) cough or a night-time cough.  Wearing away of tooth enamel.  Weight loss. How is this diagnosed? Your health care provider will take a medical history and perform a physical exam. To determine if you have mild or severe GERD, your health care provider may also monitor how you respond to treatment. You may also have tests, including:  A test to examine your stomach and esophagus with a small camera (endoscopy).  A test thatmeasures the acidity level in your esophagus.  A test thatmeasures how much pressure is on your esophagus.  A barium swallow or modified barium swallow test to show the shape, size, and functioning of your esophagus. How is this treated? The goal of treatment is to help relieve your symptoms and to prevent complications. Treatment for this condition may vary depending on how severe your symptoms are. Your health care provider may recommend:  Changes to your diet.  Medicine.  Surgery. Follow these instructions at home: Eating and drinking   Follow a diet as recommended by your health care provider. This may involve avoiding foods and drinks such as: ? Coffee and tea (with or without  caffeine). ? Drinks that containalcohol. ? Energy drinks and sports drinks. ? Carbonated drinks or sodas. ? Chocolate and cocoa. ? Peppermint and mint flavorings. ? Garlic and onions. ? Horseradish. ? Spicy and acidic foods, including peppers, chili powder, curry powder, vinegar, hot sauces, and barbecue  sauce. ? Citrus fruit juices and citrus fruits, such as oranges, lemons, and limes. ? Tomato-based foods, such as red sauce, chili, salsa, and pizza with red sauce. ? Fried and fatty foods, such as donuts, french fries, potato chips, and high-fat dressings. ? High-fat meats, such as hot dogs and fatty cuts of red and white meats, such as rib eye steak, sausage, ham, and bacon. ? High-fat dairy items, such as whole milk, butter, and cream cheese.  Eat small, frequent meals instead of large meals.  Avoid drinking large amounts of liquid with your meals.  Avoid eating meals during the 2-3 hours before bedtime.  Avoid lying down right after you eat.  Do not exercise right after you eat. Lifestyle   Do not use any products that contain nicotine or tobacco, such as cigarettes, e-cigarettes, and chewing tobacco. If you need help quitting, ask your health care provider.  Try to reduce your stress by using methods such as yoga or meditation. If you need help reducing stress, ask your health care provider.  If you are overweight, reduce your weight to an amount that is healthy for you. Ask your health care provider for guidance about a safe weight loss goal. General instructions  Pay attention to any changes in your symptoms.  Take over-the-counter and prescription medicines only as told by your health care provider. Do not take aspirin, ibuprofen, or other NSAIDs unless your health care provider told you to do so.  Wear loose-fitting clothing. Do not wear anything tight around your waist that causes pressure on your abdomen.  Raise (elevate) the head of your bed about 6 inches (15 cm).  Avoid bending over if this makes your symptoms worse.  Keep all follow-up visits as told by your health care provider. This is important. Contact a health care provider if:  You have: ? New symptoms. ? Unexplained weight loss. ? Difficulty swallowing or it hurts to swallow. ? Wheezing or a persistent  cough. ? A hoarse voice.  Your symptoms do not improve with treatment. Get help right away if you:  Have pain in your arms, neck, jaw, teeth, or back.  Feel sweaty, dizzy, or light-headed.  Have chest pain or shortness of breath.  Vomit and your vomit looks like blood or coffee grounds.  Faint.  Have stool that is bloody or black.  Cannot swallow, drink, or eat. Summary  Gastroesophageal reflux happens when acid from the stomach flows up into the esophagus. GERD is a disease in which the reflux happens often, causes frequent or severe symptoms, or causes problems such as damage to the esophagus.  Treatment for this condition may vary depending on how severe your symptoms are. Your health care provider may recommend diet and lifestyle changes, medicine, or surgery.  Contact a health care provider if you have new or worsening symptoms.  Take over-the-counter and prescription medicines only as told by your health care provider. Do not take aspirin, ibuprofen, or other NSAIDs unless your health care provider told you to do so.  Keep all follow-up visits as told by your health care provider. This is important. This information is not intended to replace advice given to you by your health care provider. Make sure  you discuss any questions you have with your health care provider. Document Revised: 04/13/2018 Document Reviewed: 04/13/2018 Elsevier Patient Education  2020 [employer].   Food Choices for Gastroesophageal Reflux Disease, Adult When you have gastroesophageal reflux disease (GERD), the foods you eat and your eating habits are very important. Choosing the right foods can help ease your discomfort. Think about working with a nutrition specialist (dietitian) to help you make good choices. What are tips for following this plan?  Meals  Choose healthy foods that are low in fat, such as fruits, vegetables, whole grains, low-fat dairy products, and lean meat, fish, and  poultry.  Eat small meals often instead of 3 large meals a day. Eat your meals slowly, and in a place where you are relaxed. Avoid bending over or lying down until 2-3 hours after eating.  Avoid eating meals 2-3 hours before bed.  Avoid drinking a lot of liquid with meals.  Cook foods using methods other than frying. Bake, grill, or broil food instead.  Avoid or limit: ? Chocolate. ? Peppermint or spearmint. ? Alcohol. ? Pepper. ? Black and decaffeinated coffee. ? Black and decaffeinated tea. ? Bubbly (carbonated) soft drinks. ? Caffeinated energy drinks and soft drinks.  Limit high-fat foods such as: ? Fatty meat or fried foods. ? Whole milk, cream, butter, or ice cream. ? Nuts and nut butters. ? Pastries, donuts, and sweets made with butter or shortening.  Avoid foods that cause symptoms. These foods may be different for everyone. Common foods that cause symptoms include: ? Tomatoes. ? Oranges, lemons, and limes. ? Peppers. ? Spicy food. ? Onions and garlic. ? Vinegar. Lifestyle  Maintain a healthy weight. Ask your doctor what weight is healthy for you. If you need to lose weight, work with your doctor to do so safely.  Exercise for at least 30 minutes for 5 or more days each week, or as told by your doctor.  Wear loose-fitting clothes.  Do not smoke. If you need help quitting, ask your doctor.  Sleep with the head of your bed higher than your feet. Use a wedge under the mattress or blocks under the bed frame to raise the head of the bed. Summary  When you have gastroesophageal reflux disease (GERD), food and lifestyle choices are very important in easing your symptoms.  Eat small meals often instead of 3 large meals a day. Eat your meals slowly, and in a place where you are relaxed.  Limit high-fat foods such as fatty meat or fried foods.  Avoid bending over or lying down until 2-3 hours after eating.  Avoid peppermint and spearmint, caffeine, alcohol, and  chocolate. This information is not intended to replace advice given to you by your health care provider. Make sure you discuss any questions you have with your health care provider. Document Revised: 01/26/2019 Document Reviewed: 11/10/2016 Elsevier Patient Education  2020 [employer].   I appreciate the  opportunity to care for you  Thank You   Marsa Aris , MD

## 2020-06-26 NOTE — Progress Notes (Signed)
Dominique Evans    [id number]    [birth year]  Primary Care Physician:[name], [name], MD  Referring Physician: [name], MD [address] [place],  [place] [address]   Chief complaint:  GERD, erosive esophagitis  HPI: 54 year old very pleasant female with history of Roux-en-Y, hypertension, diabetes and chronic GERD  She was hospitalized last year with nausea, vomiting, atypical chest pain and dysphagia. EGD while inpatient by Dr. [name] in August 2020 showed moderately dilated esophagus with multiple large distal esophageal ulcers  She is currently on Prevacid daily.  Denies any dysphagia. She continues to have intermittent regurgitation, worse when she eats late dinner or lays down after a meal  Bowel habits at baseline, takes MiraLAX daily with coffee she has 1-2 formed bowel movements daily.  EGD January 24, 2016: LA grade a esophagitis, dilated esophagus with fluid, gastric bezoar.  Gastrojejunal junction dilated with TTS balloon to 20 mm.  Jejunum appeared normal  Colonoscopy 2016 was limited due to poor prep  Outpatient Encounter Medications as of 06/26/2020  Medication Sig  . acetaminophen (TYLENOL) 500 MG tablet Take 500 mg by mouth in the morning and at bedtime.  Marland Kitchen amitriptyline (ELAVIL) 25 MG tablet Take 50 mg by mouth at bedtime.   . Calcium Carb-Cholecalciferol (CALCIUM 500+D3) 500-400 MG-UNIT TABS Take 1 tablet by mouth daily.  Marland Kitchen desvenlafaxine (PRISTIQ) 50 MG 24 hr tablet Take 50 mg by mouth daily.  . ferrous sulfate 325 (65 FE) MG tablet Take 325 mg by mouth daily with breakfast.  . fexofenadine (ALLEGRA) 180 MG tablet Take 180 mg by mouth daily.  . Fluticasone-Salmeterol (ADVAIR DISKUS) 250-50 MCG/DOSE AEPB Inhale 1 puff into the lungs 2 (two) times daily.   . furosemide (LASIX) 20 MG tablet Take 20 mg by mouth every morning.   Marland Kitchen HUMIRA PEN 40 MG/0.4ML PNKT Inject 40 mg into the skin every 14 (fourteen) days.  . lansoprazole (PREVACID) 30 MG  capsule TAKE 1 CAPSULE BY MOUTH TWICE A DAY BEFORE MEAL *NEED OFFICE VISIT FOR MORE REFILLS* (Patient taking differently: Take 30 mg by mouth in the morning and at bedtime. )  . losartan (COZAAR) 50 MG tablet Take 50 mg by mouth daily.  . montelukast (SINGULAIR) 10 MG tablet Take 10 mg by mouth at bedtime.   . Multiple Vitamins-Minerals (MULTIVITAMIN PO) Take 1 tablet by mouth daily.   Marland Kitchen oxyCODONE-acetaminophen (PERCOCET/ROXICET) 5-325 MG tablet Take 1 tablet by mouth 5 (five) times daily.   . polyethylene glycol powder (MIRALAX) 17 GM/SCOOP powder Take 17 g by mouth daily.   . potassium citrate (UROCIT-K) 10 MEQ (1080 MG) SR tablet Take 10 mEq by mouth 3 (three) times daily with meals.   . pregabalin (LYRICA) 100 MG capsule Take 100 mg by mouth 2 (two) times daily.  . rosuvastatin (CRESTOR) 10 MG tablet Take 5 mg by mouth every morning.   . verapamil (VERELAN PM) 240 MG 24 hr capsule Take 240 mg by mouth daily.  . [DISCONTINUED] pantoprazole (PROTONIX) 20 MG tablet Take 1 tablet (20 mg total) by mouth daily.   No facility-administered encounter medications on file as of 06/26/2020.    Allergies as of 06/26/2020 - Review Complete 06/19/2020  Allergen Reaction Noted  . Penicillins Anaphylaxis   . Sulfonamide derivatives Anaphylaxis   . Clindamycin/lincomycin Nausea And Vomiting 06/11/2015  . Sulfa antibiotics Other (See Comments) 04/23/2020  . Ciprofloxacin Hives 01/05/2013    Past Medical History:  Diagnosis Date  .  Allergic rhinitis   . Anemia   . Arthritis    spondylolisthesis- lumbar region  . Asthma    PFT 08/12/07: FEV1 2.97 (104%), FEV1% 84, TLC 4.89 (90%), DLCO 84%, +BD  . Blood transfusion    hx of 2002   . Chronic constipation   . Chronic pain 09/24/2018  . 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 Diabetic neuropathy (HCC) 09/22/2019  . Esophageal dysmotility 03/01/2015  . Esophageal dysphagia 06/10/2019  . GERD (gastroesophageal  reflux disease)   . Head injury due to trauma    age 75, went under car while sledding, had memory loss at that time  . HNP (herniated nucleus pulposus), lumbar 11/30/2012  . 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 Hx of hiatal hernia   . Hydatidiform mole   . Hyperlipidemia   . Lateral ventral hernia 03/01/2015  . Major depressive disorder with single episode 09/24/2018  . Mild neurocognitive disorder, unclear etiology 10/25/2019  . 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 Obesity   . Obstructive sleep apnea 10/31/2007   Qualifier: Diagnosis of  By: Craige Cotta MD, Vineet    . Panic attacks   . PONV (postoperative nausea and vomiting)   . Psoriasis   . Status post dilation of esophageal narrowing   . Type 2 diabetes mellitus with foot ulcer (HCC) 10/04/2007   Qualifier: Diagnosis of  By: Craige Cotta MD, Vineet    . Unspecified essential hypertension   . Visual disturbance     Past Surgical History:  Procedure Laterality Date  . BIOPSY  06/11/2019   Procedure: BIOPSY;  Surgeon: Charna Elizabeth, MD;  Location: [facility] ENDOSCOPY;  Service: Endoscopy;;  . BOTOX INJECTION N/A 06/07/2015   Procedure: BOTOX INJECTION;  Surgeon: Louis Meckel, MD;  Location: [facility] ENDOSCOPY;  Service: Endoscopy;  Laterality: N/A;  . COLONOSCOPY WITH PROPOFOL N/A 06/07/2015   Procedure: COLONOSCOPY WITH PROPOFOL;  Surgeon: Louis Meckel, MD;  Location: [facility] ENDOSCOPY;  Service: Endoscopy;  Laterality: N/A;  . ELBOW FRACTURE SURGERY     left  . ESOPHAGEAL MANOMETRY N/A 04/08/2015   Procedure: ESOPHAGEAL MANOMETRY (EM);  Surgeon: Louis Meckel, MD;  Location: [facility] ENDOSCOPY;  Service: Endoscopy;  Laterality: N/A;  . ESOPHAGOGASTRODUODENOSCOPY (EGD) WITH PROPOFOL N/A 06/07/2015   Procedure: ESOPHAGOGASTRODUODENOSCOPY (EGD) WITH PROPOFOL;  Surgeon: Louis Meckel, MD;  Location: [facility] ENDOSCOPY;  Service: Endoscopy;  Laterality: N/A;  . ESOPHAGOGASTRODUODENOSCOPY (EGD) WITH PROPOFOL Left 06/11/2019   Procedure:  ESOPHAGOGASTRODUODENOSCOPY (EGD) WITH PROPOFOL;  Surgeon: Charna Elizabeth, MD;  Location[facility] ENDOSCOPY;  Service: Endoscopy;  Laterality: Left;  Marland Kitchen GASTRIC ROUX-EN-Y  10/05/2011   Procedure: LAPAROSCOPIC ROUX-EN-Y GASTRIC;  Surgeon: Mariella Saa, MD;  Location: [facility] ORS;  Service: General;  Laterality: N/A;  UPPER ENDOSCOPY  . lap band removal     . LAPAROSCOPIC GASTRIC BANDING  02/2006   w/ truncal vagotomy  . LUMBAR LAMINECTOMY/DECOMPRESSION MICRODISCECTOMY Left 11/30/2012   Procedure: MICRO LUMBAR DECOMPRESSION L4-5 ON THE LEFT;  Surgeon: Javier Docker, MD;  Location: [facility] ORS;  Service: Orthopedics;  Laterality: Left;  MICRO LUMBAR DECOMPRESSION L4-5 ON THE LEFT  . LUMBAR LAMINECTOMY/DECOMPRESSION MICRODISCECTOMY N/A 03/30/2013   Procedure: REDO MICRO LUMBAR DECOMPRESSION L4-5;  Surgeon: Javier Docker, MD;  Location: [facility] ORS;  Service: Orthopedics;  Laterality: N/A;  . MAXIMUM ACCESS (MAS)POSTERIOR LUMBAR INTERBODY FUSION (PLIF) 1 LEVEL N/A 05/23/2014   Procedure: FOR MAXIMUM ACCESS (MAS) POSTERIOR  LUMBAR INTERBODY FUSION (PLIF) Lumbar Four-Five;  Surgeon: Tia Alertavid S Jones, MD;  Location: [facility] NEURO ORS;  Service: Neurosurgery;  Laterality: N/A;  FOR MAXIMUM ACCESS (MAS) POSTERIOR LUMBAR INTERBODY FUSION (PLIF) Lumbar Four-Five  . NEPHRECTOMY  2002   left  . PATENT DUCTUS ARTERIOUS REPAIR    . SHOULDER ARTHROSCOPY     right shoulder    Family History  Problem Relation Age of Onset  . Hypertension Father   . Skin cancer Father   . Heart disease Father        before age 56  . Heart attack Father   . Hyperlipidemia Father   . Diabetes Mother   . Hypertension Mother   . Skin cancer Mother   . Breast cancer Mother        Melanoma  . Heart disease Mother   . Hyperlipidemia Mother   . Lung cancer Mother   . Brain cancer Mother   . Hypertension Sister   . Heart disease Sister        before age 56  . Heart attack Sister   . Hyperlipidemia Sister   . Breast cancer Maternal Aunt   . Colon  cancer Neg Hx   . Colon polyps Neg Hx   . Esophageal cancer Neg Hx   . Gallbladder disease Neg Hx   . Stomach cancer Neg Hx     Social History   Socioeconomic History  . Marital status: Divorced    Spouse name: Not on file  . Number of children: 3  . Years of education: 6914  . Highest education level: Associate degree: academic program  Occupational History  . Occupation: Sr. Engineer, maintenanceHealth Concierge    Employer: [employer]  Tobacco Use  . Smoking status: Former Smoker    Packs/day: 2.00    Types: Cigarettes    Quit date: 10/19/1982    Years since quitting: 37.7  . Smokeless tobacco: Never Used  Vaping Use  . Vaping Use: Never used  Substance and Sexual Activity  . Alcohol use: No    Alcohol/week: 0.0 standard drinks  . Drug use: No  . Sexual activity: Not on file  Other Topics Concern  . Not on file  Social History Narrative   03/01/19 lives with son, Franky [place]uke   Caffeine- diet pepsi, dr pepper, coffee   Social Determinants of Health   Financial Resource Strain:   . Difficulty of Paying Living Expenses: Not on file  Food Insecurity:   . Worried About Programme researcher, broadcasting/film/videounning Out of Food in the Last Year: Not on file  . Ran Out of Food in the Last Year: Not on file  Transportation Needs:   . Lack of Transportation (Medical): Not on file  . Lack of Transportation (Non-Medical): Not on file  Physical Activity:   . Days of Exercise per Week: Not on file  . Minutes of Exercise per Session: Not on file  Stress:   . Feeling of Stress : Not on file  Social Connections:   . Frequency of Communication with Friends and Family: Not on file  . Frequency of Social Gatherings with Friends and Family: Not on file  . Attends Religious Services: Not on file  . Active Member of Clubs or Organizations: Not on file  . Attends BankerClub or Organization Meetings: Not on file  . Marital Status: Not on file  Intimate Partner Violence:   . Fear of Current or Ex-Partner: Not on file  . Emotionally Abused: Not on  file  . Physically  Abused: Not on file  . Sexually Abused: Not on file      Review of systems: All other review of systems negative except as mentioned in the HPI.   Physical Exam: Vitals:   06/26/20 0816  BP: 120/74  Pulse: 91  SpO2: 97%   Body mass index is 34.31 kg/m. Gen:      No acute distress HEENT:  sclera anicteric Abd:      soft, non-tender; no palpable masses, no distension Ext:    No edema Neuro: alert and oriented x 3 Psych: normal mood and affect  Data Reviewed:  Reviewed labs, radiology imaging, old records and pertinent past GI work up   Assessment and Plan/Recommendations:  54 year old very pleasant female with multiple comorbidities, Roux-en-Y, diabetes, hypertension and chronic GERD  GERD with erosive esophagitis: Discussed antireflux measures Continue AcipHex daily, add Pepcid 20 mg daily at bedtime as needed No history of Barrett's esophagus or dysplasia based on recent EGD with biopsies  Chronic idiopathic constipation: Continue MiraLAX daily Increase dietary fiber and fluid intake   Inadequate bowel prep on last colonoscopy she is due for colorectal cancer screening.  Patient is having memory loss and also balance issues, gait difficulty, uses walker.  She did not feel can undergo bowel prep or colonoscopy at this point. She has mild neurocognitive disorder of unclear etiology. Will rediscuss at next office visit  Return in 6 months or sooner if needed  This visit required 40 minutes of patient care (this includes precharting, chart review, review of results, face-to-face time used for counseling as well as treatment plan and follow-up. The patient was provided an opportunity to ask questions and all were answered. The patient agreed with the plan and demonstrated an understanding of the instructions.  Iona Beard , MD    CC: [name], [name], MD

## 2020-07-04 DIAGNOSIS — Z0279 Encounter for issue of other medical certificate: Secondary | ICD-10-CM

## 2020-08-26 ENCOUNTER — Telehealth: Payer: Self-pay | Admitting: *Deleted

## 2020-08-26 NOTE — Telephone Encounter (Signed)
Pt son Samuel Bouche has question about his mom disability status. Please call [phone]

## 2020-08-26 NOTE — Telephone Encounter (Signed)
Called # for Dominique Evans, patient answered, stated he wasn't there. Called # on demographics for Dominique Evans' home, LVM requesting call back.

## 2020-08-26 NOTE — Telephone Encounter (Signed)
Son called stating STD was denied because in previous STD form MD didn't say specifically she can't work at all. I advised him that per previous conversations patient stated her PCP and pain management MD told her she shouldn't work. Therefore they should fill out STD papers. Samuel Bouche stated PCP denied, stated it is a neurological issue. Dr Manon Hilding, pain management stated he didn't see patient often enough to do STD. 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  He asked if she needs to be seen again or if STD papers can be completed stating she cannot work at all. I advised will discuss with Dr Marjory Lies and let him know. He  verbalized understanding, appreciation.

## 2020-08-27 NOTE — Telephone Encounter (Signed)
Spoke with son, Samuel Bouche and advised him Dr Marjory Lies stated This is not neurological. Her memory loss is due to psychiatry and pain issues. I cannot fill out any paperwork on this. They may want to consult a disability lawyer. He verbalized understanding, appreciation.

## 2020-09-02 ENCOUNTER — Telehealth: Payer: Self-pay | Admitting: Gastroenterology

## 2020-09-02 MED ORDER — LANSOPRAZOLE 30 MG PO CPDR: 30.0000 mg | DELAYED_RELEASE_CAPSULE | Freq: Two times a day (BID) | ORAL | 3 refills | Status: DC

## 2020-09-02 NOTE — Telephone Encounter (Signed)
In 90 day supply of Prevacid as required for insurance coverage

## 2020-09-18 ENCOUNTER — Encounter: Payer: Self-pay | Admitting: Podiatry

## 2020-09-18 ENCOUNTER — Other Ambulatory Visit: Payer: Self-pay

## 2020-09-18 ENCOUNTER — Ambulatory Visit (INDEPENDENT_AMBULATORY_CARE_PROVIDER_SITE_OTHER): Payer: 59 | Admitting: Podiatry

## 2020-09-18 DIAGNOSIS — B351 Tinea unguium: Secondary | ICD-10-CM

## 2020-09-18 DIAGNOSIS — E0843 Diabetes mellitus due to underlying condition with diabetic autonomic (poly)neuropathy: Secondary | ICD-10-CM | POA: Diagnosis not present

## 2020-09-18 DIAGNOSIS — M79674 Pain in right toe(s): Secondary | ICD-10-CM

## 2020-09-18 DIAGNOSIS — M79675 Pain in left toe(s): Secondary | ICD-10-CM

## 2020-09-18 NOTE — Progress Notes (Signed)
This patient returns to my office for at risk foot care.  This patient requires this care by a professional since this patient will be at risk due to having diabetes.  This patient is unable to cut nails herself since the patient cannot reach her nails.These nails are painful walking and wearing shoes.  This patient presents for at risk foot care today.  General Appearance  Alert, conversant and in no acute stress.  Vascular  Dorsalis pedis and posterior tibial  pulses are palpable  bilaterally.  Capillary return is within normal limits  bilaterally. Temperature is within normal limits  bilaterally.  Neurologic  Senn-Weinstein monofilament wire test absent   bilaterally. Muscle power within normal limits bilaterally.  Nails Thick disfigured discolored nails with subungual debris  from hallux to fifth toes bilaterally. No evidence of bacterial infection or drainage bilaterally.  Orthopedic  No limitations of motion  feet .  No crepitus or effusions noted.  No bony pathology or digital deformities noted.  Skin  normotropic skin with no porokeratosis noted bilaterally.  No signs of infections or ulcers noted.     Onychomycosis  Pain in right toes  Pain in left toes  Consent was obtained for treatment procedures.   Mechanical debridement of nails 1-5  bilaterally performed with a nail nipper.  Filed with dremel without incident.    Return office visit    prn                  Told patient to return for periodic foot care and evaluation due to potential at risk complications.   Helane Gunther DPM

## 2021-02-07 ENCOUNTER — Other Ambulatory Visit: Payer: Self-pay | Admitting: Internal Medicine

## 2021-02-07 DIAGNOSIS — Z1231 Encounter for screening mammogram for malignant neoplasm of breast: Secondary | ICD-10-CM

## 2021-06-04 ENCOUNTER — Other Ambulatory Visit: Payer: Self-pay | Admitting: Gastroenterology

## 2022-11-26 ENCOUNTER — Other Ambulatory Visit: Payer: Self-pay | Admitting: Physical Medicine and Rehabilitation

## 2022-11-26 ENCOUNTER — Ambulatory Visit
Admission: RE | Admit: 2022-11-26 | Discharge: 2022-11-26 | Disposition: A | Discharge status: Home / Self Care | Payer: No Typology Code available for payment source | Source: Ambulatory Visit | Attending: Physical Medicine and Rehabilitation | Admitting: Physical Medicine and Rehabilitation

## 2022-11-26 DIAGNOSIS — M25552 Pain in left hip: Secondary | ICD-10-CM

## 2023-06-01 ENCOUNTER — Other Ambulatory Visit: Payer: Self-pay

## 2023-06-01 ENCOUNTER — Emergency Department (HOSPITAL_COMMUNITY)
Admission: EM | Admit: 2023-06-01 | Discharge: 2023-06-02 | Disposition: A | Discharge status: Home / Self Care | Payer: No Typology Code available for payment source | Attending: Emergency Medicine | Admitting: Emergency Medicine

## 2023-06-01 ENCOUNTER — Encounter (HOSPITAL_COMMUNITY): Payer: Self-pay

## 2023-06-01 DIAGNOSIS — Z79899 Other long term (current) drug therapy: Secondary | ICD-10-CM | POA: Insufficient documentation

## 2023-06-01 DIAGNOSIS — Z794 Long term (current) use of insulin: Secondary | ICD-10-CM | POA: Diagnosis not present

## 2023-06-01 DIAGNOSIS — E119 Type 2 diabetes mellitus without complications: Secondary | ICD-10-CM | POA: Insufficient documentation

## 2023-06-01 DIAGNOSIS — I10 Essential (primary) hypertension: Secondary | ICD-10-CM | POA: Diagnosis not present

## 2023-06-01 DIAGNOSIS — F0390 Unspecified dementia without behavioral disturbance: Secondary | ICD-10-CM | POA: Diagnosis not present

## 2023-06-01 DIAGNOSIS — R4589 Other symptoms and signs involving emotional state: Secondary | ICD-10-CM | POA: Diagnosis present

## 2023-06-01 DIAGNOSIS — F03C18 Unspecified dementia, severe, with other behavioral disturbance: Secondary | ICD-10-CM | POA: Diagnosis not present

## 2023-06-01 DIAGNOSIS — R45851 Suicidal ideations: Secondary | ICD-10-CM | POA: Diagnosis not present

## 2023-06-01 LAB — URINALYSIS, ROUTINE W REFLEX MICROSCOPIC
Bilirubin Urine: NEGATIVE
Glucose, UA: NEGATIVE mg/dL
Hgb urine dipstick: NEGATIVE
Ketones, ur: NEGATIVE mg/dL
Nitrite: POSITIVE — AB
Protein, ur: 30 mg/dL — AB
Specific Gravity, Urine: 1.014 (ref 1.005–1.030)
WBC, UA: >50 WBC/hpf (ref 0–5)
pH: 7 (ref 5.0–8.0)

## 2023-06-01 LAB — COMPREHENSIVE METABOLIC PANEL
ALT: 19 U/L (ref 0–44)
AST: 20 U/L (ref 15–41)
Albumin: 3.8 g/dL (ref 3.5–5.0)
Alkaline Phosphatase: 98 U/L (ref 38–126)
Anion gap: 11 (ref 5–15)
BUN: 18 mg/dL (ref 6–20)
CO2: 28 mmol/L (ref 22–32)
Calcium: 8.9 mg/dL (ref 8.9–10.3)
Chloride: 101 mmol/L (ref 98–111)
Creatinine, Ser: 0.8 mg/dL (ref 0.44–1.00)
GFR, Estimated: >60 mL/min (ref >60)
Glucose, Bld: 84 mg/dL (ref 70–99)
Potassium: 3.9 mmol/L (ref 3.5–5.1)
Sodium: 140 mmol/L (ref 135–145)
Total Bilirubin: 0.2 mg/dL — ABNORMAL LOW (ref 0.3–1.2)
Total Protein: 7.4 g/dL (ref 6.5–8.1)

## 2023-06-01 LAB — CBC
HCT: 38.1 % (ref 36.0–46.0)
Hemoglobin: 12.1 g/dL (ref 12.0–15.0)
MCH: 28.1 pg (ref 26.0–34.0)
MCHC: 31.8 g/dL (ref 30.0–36.0)
MCV: 88.4 fL (ref 80.0–100.0)
Platelets: 239 10*3/uL (ref 150–400)
RBC: 4.31 MIL/uL (ref 3.87–5.11)
RDW: 13.4 % (ref 11.5–15.5)
WBC: 7.3 10*3/uL (ref 4.0–10.5)
nRBC: 0 % (ref 0.0–0.2)

## 2023-06-01 LAB — CBG MONITORING, ED: Glucose-Capillary: 78 mg/dL (ref 70–99)

## 2023-06-01 LAB — TSH: TSH: 1.945 u[IU]/mL (ref 0.350–4.500)

## 2023-06-01 LAB — MAGNESIUM: Magnesium: 2.3 mg/dL (ref 1.7–2.4)

## 2023-06-01 MED ORDER — PREGABALIN 100 MG PO CAPS
100.0000 mg | ORAL_CAPSULE | Freq: Two times a day (BID) | ORAL | Status: DC
  Administered 2023-06-01 – 2023-06-02 (×2): 100 mg via ORAL
  Filled 2023-06-01 (×2): qty 1

## 2023-06-01 MED ORDER — VERAPAMIL HCL ER 240 MG PO TBCR
240.0000 mg | EXTENDED_RELEASE_TABLET | Freq: Every evening | ORAL | Status: DC
  Administered 2023-06-01: 240 mg via ORAL
  Filled 2023-06-01 (×2): qty 1

## 2023-06-01 MED ORDER — DONEPEZIL HCL 10 MG PO TABS
5.0000 mg | ORAL_TABLET | Freq: Every day | ORAL | Status: DC
  Administered 2023-06-01: 5 mg via ORAL
  Filled 2023-06-01: qty 1

## 2023-06-01 MED ORDER — NALDEMEDINE TOSYLATE 0.2 MG PO TABS: 1.0000 | ORAL_TABLET | Freq: Every day | ORAL | Status: DC

## 2023-06-01 MED ORDER — OXYCODONE-ACETAMINOPHEN 5-325 MG PO TABS: 1.0000 | ORAL_TABLET | ORAL | Status: DC

## 2023-06-01 MED ORDER — OXYCODONE-ACETAMINOPHEN 5-325 MG PO TABS
1.0000 | ORAL_TABLET | Freq: Four times a day (QID) | ORAL | Status: DC | PRN
  Administered 2023-06-01 – 2023-06-02 (×3): 1 via ORAL
  Filled 2023-06-01 (×3): qty 1

## 2023-06-01 MED ORDER — POLYETHYLENE GLYCOL 3350 17 GM/SCOOP PO POWD: 17.0000 g | Freq: Every day | ORAL | Status: DC

## 2023-06-01 MED ORDER — FOSFOMYCIN TROMETHAMINE 3 G PO PACK
3.0000 g | PACK | Freq: Once | ORAL | Status: AC
  Administered 2023-06-01: 3 g via ORAL
  Filled 2023-06-01: qty 3

## 2023-06-01 MED ORDER — VENLAFAXINE HCL ER 75 MG PO CP24
150.0000 mg | ORAL_CAPSULE | Freq: Every day | ORAL | Status: DC
  Administered 2023-06-02: 150 mg via ORAL
  Filled 2023-06-01: qty 2

## 2023-06-01 MED ORDER — FUROSEMIDE 20 MG PO TABS
20.0000 mg | ORAL_TABLET | Freq: Every morning | ORAL | Status: DC
  Administered 2023-06-02: 20 mg via ORAL
  Filled 2023-06-01: qty 1

## 2023-06-01 MED ORDER — CALCIUM CARB-CHOLECALCIFEROL 500-10 MG-MCG PO TABS: 1.0000 | ORAL_TABLET | Freq: Every day | ORAL | Status: DC

## 2023-06-01 MED ORDER — AMITRIPTYLINE HCL 25 MG PO TABS
50.0000 mg | ORAL_TABLET | Freq: Every day | ORAL | Status: DC
  Administered 2023-06-01: 50 mg via ORAL
  Filled 2023-06-01: qty 2

## 2023-06-01 MED ORDER — ACETAMINOPHEN 500 MG PO TABS: 1000.0000 mg | ORAL_TABLET | Freq: Once | ORAL | Status: DC

## 2023-06-01 MED ORDER — MONTELUKAST SODIUM 10 MG PO TABS
10.0000 mg | ORAL_TABLET | Freq: Every day | ORAL | Status: DC
  Administered 2023-06-01: 10 mg via ORAL
  Filled 2023-06-01: qty 1

## 2023-06-01 MED ORDER — ROSUVASTATIN CALCIUM 5 MG PO TABS
5.0000 mg | ORAL_TABLET | Freq: Every morning | ORAL | Status: DC
  Administered 2023-06-02: 5 mg via ORAL
  Filled 2023-06-01: qty 1

## 2023-06-01 MED ORDER — OYSTER SHELL CALCIUM/D3 500-5 MG-MCG PO TABS
1.0000 | ORAL_TABLET | Freq: Every day | ORAL | Status: DC
  Administered 2023-06-02: 1 via ORAL
  Filled 2023-06-01: qty 1

## 2023-06-01 MED ORDER — POLYETHYLENE GLYCOL 3350 17 G PO PACK
17.0000 g | PACK | Freq: Every day | ORAL | Status: DC
  Administered 2023-06-02: 17 g via ORAL
  Filled 2023-06-01: qty 1

## 2023-06-01 MED ORDER — POTASSIUM CITRATE ER 10 MEQ (1080 MG) PO TBCR
10.0000 meq | EXTENDED_RELEASE_TABLET | Freq: Three times a day (TID) | ORAL | Status: DC
  Administered 2023-06-02 (×3): 10 meq via ORAL
  Filled 2023-06-01 (×4): qty 1

## 2023-06-01 NOTE — ED Notes (Signed)
NT assigned to sit with patient

## 2023-06-01 NOTE — Consult Note (Incomplete)
Telepsych Consultation   Reason for Consult:  "suicidal ideations, underlying dementia." Referring Physician:  Mertha Baars, PA-C Location of Patient:    [facility] Location of Provider: Other: [facility]  Patient Identification: Dominique Evans MRN:  [id number] Principal Diagnosis: <principal problem not specified> Diagnosis:  Active Problems:   Dementia (HCC)   Total Time spent with patient: 1 hour  Subjective:   Dominique Evans is a 57 y.o. female patient admitted with  Per RN Triage Note dated 06/01/2023@0934 : "Pt came in via POV d/t reporting her dementia has worsened in the last 3 weeks. Family stating she tries to eat unsafe raw food, trying to walk out of the home, forgetting family & family has been having difficulty taking care of her. Is here for eval & request caseworker assistance for possible placement. Pt does already have caseworker but family reports any help would be helpful. Alert to self while in triage. Family reports she has clearly told them she wants them to kill her & Son at bedside reports that he cannot continue to take care of her. "   HPI:   Patient seen via telepsych by this provider; chart reviewed and consulted with Dr. Lucianne Muss on 06/01/23.  On evaluation Dominique Evans is seen sitting on bed; she is appropriately groomed, hair appears combed; she is wearing a hospital gown. Pt greeted by this Clinical research associate and given anticipatory guidance.  When asked how she's doing today she replies, "Not good, I don't remember things, I've been forgetting things a lot."  She is alert and oriented to self only.  She acknowledges she does not the time, or date and observed frequently asking her son, "can you answer that? Do you know where we are?" She is aware her son is in the room with her; She states his name but then asks him for reassurance.  While talking with patient trying to ascertain her level of functioning,  she abruptly stands up and walks around the room  independently, and states, "see I can do that on my own.   Pt appears confused, her speech is disorganized and tangenitial.  When   Collateral from her son, whom states his mother began living with him within the past 3 weeks.  States since that time, daily, she has asked him to kill her; when visitors are there, she has also asked them to kill her as well. He describes his mother as a religious person who shared she would not kill herself because she views it as a sin.  He states she has not demonstrated suicidal behaviors.  He recalls intermittently she complains her skin itches and he has found her using a pair of scissors to scratch her ii His mom has reported her skin itches and then  She is able to wash and groom herself appropriately; he has not noticed any deficits in that area.  He reports she eats things that she should not. Her works 6pm to 6pm shifts, occasionally  has to work later.  During that time, she is home alone.   He has firearms but when his mom arrived, he dismantled them and placed the parts in various areas throughout his home.  He has put the scissors  Pt states she wants to go heaven but cannot kills herself for fear she will go to hell. States she does not drink alcohol. Stopped smoking cigarettes but stopped at age 39.      Appears 3 years ago, she was dx with neurocognitive disorder  with unknown etiology.   Lab: CMP is within normal limits WBC does not show leukocytosis or anemia TSH is within normal limits at 1.945   During evaluation Dominique Evans is ***(position); ***he/she is alert/oriented x 4; calm/cooperative; and mood congruent with affect.  Patient is speaking in a clear tone at moderate volume, and normal pace; with good eye contact.  ***His/Her thought process is coherent and relevant; There is no indication that ***he/she is currently responding to internal/external stimuli or experiencing delusional thought content.  Patient denies  suicidal/self-harm/homicidal ideation, psychosis, and paranoia.  Patient has remained calm throughout assessment and has answered questions appropriately.    Past Psychiatric History: cognitive impairment  Risk to Self:  yes d/t impaired cognitive functioning Risk to Others:  no Prior Inpatient Therapy:  no Prior Outpatient Therapy:  no  Past Medical History:  Past Medical History:  Diagnosis Date  . Allergic rhinitis   . Anemia   . Arthritis    spondylolisthesis- lumbar region  . Asthma    PFT 08/12/07: FEV1 2.97 (104%), FEV1% 84, TLC 4.89 (90%), DLCO 84%, +BD  . Blood transfusion    hx of 2002   . Chronic constipation   . Chronic pain 09/24/2018  . 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 Diabetic neuropathy (HCC) 09/22/2019  . Esophageal dysmotility 03/01/2015  . Esophageal dysphagia 06/10/2019  . GERD (gastroesophageal reflux disease)   . Head injury due to trauma    age 26, went under car while sledding, had memory loss at that time  . HNP (herniated nucleus pulposus), lumbar 11/30/2012  . 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 Hx of hiatal hernia   . Hydatidiform mole   . Hyperlipidemia   . Lateral ventral hernia 03/01/2015  . Major depressive disorder with single episode 09/24/2018  . Mild neurocognitive disorder, unclear etiology 10/25/2019  . 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 Obesity   . Obstructive sleep apnea 10/31/2007   Qualifier: Diagnosis of  By: Craige Cotta MD, Vineet    . Panic attacks   . PONV (postoperative nausea and vomiting)   . Psoriasis   . Status post dilation of esophageal narrowing   . Type 2 diabetes mellitus with foot ulcer (HCC) 10/04/2007   Qualifier: Diagnosis of  By: Craige Cotta MD, Vineet    . Unspecified essential hypertension   . Visual disturbance     Past Surgical History:  Procedure Laterality Date  . BIOPSY  06/11/2019   Procedure: BIOPSY;  Surgeon: Charna Elizabeth, MD;  Location: [facility]  ENDOSCOPY;  Service: Endoscopy;;  . BOTOX INJECTION N/A 06/07/2015   Procedure: BOTOX INJECTION;  Surgeon: Louis Meckel, MD;  Location: [facility] ENDOSCOPY;  Service: Endoscopy;  Laterality: N/A;  . COLONOSCOPY WITH PROPOFOL N/A 06/07/2015   Procedure: COLONOSCOPY WITH PROPOFOL;  Surgeon: Louis Meckel, MD;  Location: [facility] ENDOSCOPY;  Service: Endoscopy;  Laterality: N/A;  . ELBOW FRACTURE SURGERY     left  . ESOPHAGEAL MANOMETRY N/A 04/08/2015   Procedure: ESOPHAGEAL MANOMETRY (EM);  Surgeon: Louis Meckel, MD;  Location: [facility] ENDOSCOPY;  Service: Endoscopy;  Laterality: N/A;  . ESOPHAGOGASTRODUODENOSCOPY (EGD) WITH PROPOFOL N/A 06/07/2015   Procedure: ESOPHAGOGASTRODUODENOSCOPY (EGD) WITH PROPOFOL;  Surgeon: Louis Meckel, MD;  Location: [facility] ENDOSCOPY;  Service: Endoscopy;  Laterality: N/A;  . ESOPHAGOGASTRODUODENOSCOPY (EGD) WITH PROPOFOL Left 06/11/2019   Procedure: ESOPHAGOGASTRODUODENOSCOPY (EGD) WITH PROPOFOL;  Surgeon: Charna Elizabeth, MD;  Location: [facility] ENDOSCOPY;  Service: Endoscopy;  Laterality: Left;  Marland Kitchen GASTRIC ROUX-EN-Y  10/05/2011   Procedure: LAPAROSCOPIC ROUX-EN-Y GASTRIC;  Surgeon: Mariella Saa, MD;  Location: [facility] ORS;  Service: General;  Laterality: N/A;  UPPER ENDOSCOPY  . lap band removal     . LAPAROSCOPIC GASTRIC BANDING  02/2006   w/ truncal vagotomy  . LUMBAR LAMINECTOMY/DECOMPRESSION MICRODISCECTOMY Left 11/30/2012   Procedure: MICRO LUMBAR DECOMPRESSION L4-5 ON THE LEFT;  Surgeon: Javier Docker, MD;  Location: [facility] ORS;  Service: Orthopedics;  Laterality: Left;  MICRO LUMBAR DECOMPRESSION L4-5 ON THE LEFT  . LUMBAR LAMINECTOMY/DECOMPRESSION MICRODISCECTOMY N/A 03/30/2013   Procedure: REDO MICRO LUMBAR DECOMPRESSION L4-5;  Surgeon: Javier Docker, MD;  Location: [facility] ORS;  Service: Orthopedics;  Laterality: N/A;  . MAXIMUM ACCESS (MAS)POSTERIOR LUMBAR INTERBODY FUSION (PLIF) 1 LEVEL N/A 05/23/2014   Procedure: FOR MAXIMUM ACCESS (MAS) POSTERIOR LUMBAR INTERBODY FUSION (PLIF)  Lumbar Four-Five;  Surgeon: Tia Alert, MD;  Location: [facility] NEURO ORS;  Service: Neurosurgery;  Laterality: N/A;  FOR MAXIMUM ACCESS (MAS) POSTERIOR LUMBAR INTERBODY FUSION (PLIF) Lumbar Four-Five  . NEPHRECTOMY  2002   left  . PATENT DUCTUS ARTERIOUS REPAIR    . SHOULDER ARTHROSCOPY     right shoulder   Family History:  Family History  Problem Relation Age of Onset  . Hypertension Father   . Skin cancer Father   . Heart disease Father        before age 1  . Heart attack Father   . Hyperlipidemia Father   . Diabetes Mother   . Hypertension Mother   . Skin cancer Mother   . Breast cancer Mother        Melanoma  . Heart disease Mother   . Hyperlipidemia Mother   . Lung cancer Mother   . Brain cancer Mother   . Hypertension Sister   . Heart disease Sister        before age 36  . Heart attack Sister   . Hyperlipidemia Sister   . Breast cancer Maternal Aunt   . Colon cancer Neg Hx   . Colon polyps Neg Hx   . Esophageal cancer Neg Hx   . Gallbladder disease Neg Hx   . Stomach cancer Neg Hx    Family Psychiatric  History: *** Social History:  Social History   Substance and Sexual Activity  Alcohol Use No  . Alcohol/week: 0.0 standard drinks of alcohol     Social History   Substance and Sexual Activity  Drug Use No    Social History   Socioeconomic History  . Marital status: Divorced    Spouse name: Not on file  . Number of children: 3  . Years of education: 26  . Highest education level: Associate degree: academic program  Occupational History  . Occupation: Sr. 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 Smoking status: Former    Current packs/day: 0.00    Types: Cigarettes    Quit date: 10/19/1982    Years since quitting: 40.6  . Smokeless tobacco: Never  Vaping Use  . Vaping status: Never Used  Substance and Sexual Activity  . Alcohol use: No    Alcohol/week: 0.0 standard drinks of alcohol  . Drug use: No  . Sexual activity:  Not on file  Other Topics Concern  . Not on file  Social History Narrative   03/01/19 lives with son, Franky Macho   Caffeine- diet pepsi, dr pepper, coffee  Social Determinants of Health   Financial Resource Strain: Low Risk  (01/14/2022)   Received from [facility], [facility]   Overall Financial Resource Strain (CARDIA)   . Difficulty of Paying Living Expenses: Not hard at all  Food Insecurity: No Food Insecurity (01/14/2022)   Received from [facility], [facility]   Hunger Vital Sign   . Worried About Programme researcher, broadcasting/film/video in the Last Year: Never true   . Ran Out of Food in the Last Year: Never true  Transportation Needs: No Transportation Needs (01/14/2022)   Received from [facility], [facility]   [facility] - Transportation   . Lack of Transportation (Medical): No   . Lack of Transportation (Non-Medical): No  Physical Activity: Inactive (01/14/2022)   Received from [facility], [facility]   Exercise Vital Sign   . Days of Exercise per Week: 0 days   . Minutes of Exercise per Session: 0 min  Stress: Not on file  Social Connections: Not on file   Additional Social History:    Allergies:   Allergies  Allergen Reactions  . Penicillins Anaphylaxis    Has patient had a PCN reaction causing immediate rash, facial/tongue/throat swelling, SOB or lightheadedness with hypotension: yes Has patient had a PCN reaction causing severe rash involving mucus membranes or skin necrosis: unknown Has patient had a PCN reaction that required hospitalization unknown Has patient had a PCN reaction occurring within the last 10 years: no If all of the above answers are "NO", then may proceed with Cephalosporin use.   . Sulfonamide Derivatives Anaphylaxis  . Clindamycin/Lincomycin Nausea And Vomiting  . Sulfa Antibiotics Other (See Comments)    unknown  . Ciprofloxacin Hives    Labs:  Results for orders placed or performed during the hospital encounter of 06/01/23  (from the past 48 hour(s))  Comprehensive metabolic panel     Status: Abnormal   Collection Time: 06/01/23  9:52 AM  Result Value Ref Range   Sodium 140 135 - 145 mmol/L   Potassium 3.9 3.5 - 5.1 mmol/L   Chloride 101 98 - 111 mmol/L   CO2 28 22 - 32 mmol/L   Glucose, Bld 84 70 - 99 mg/dL    Comment: Glucose reference range applies only to samples taken after fasting for at least 8 hours.   BUN 18 6 - 20 mg/dL   Creatinine, Ser 1.61 0.44 - 1.00 mg/dL   Calcium 8.9 8.9 - 09.6 mg/dL   Total Protein 7.4 6.5 - 8.1 g/dL   Albumin 3.8 3.5 - 5.0 g/dL   AST 20 15 - 41 U/L   ALT 19 0 - 44 U/L   Alkaline Phosphatase 98 38 - 126 U/L   Total Bilirubin 0.2 (L) 0.3 - 1.2 mg/dL   GFR, Estimated >04 >54 mL/min    Comment: (NOTE) Calculated using the CKD-EPI Creatinine Equation (2021)    Anion gap 11 5 - 15    Comment: Performed at [facility], [address], [place], [place] [address]  CBC     Status: None   Collection Time: 06/01/23  9:52 AM  Result Value Ref Range   WBC 7.3 4.0 - 10.5 K/uL   RBC 4.31 3.87 - 5.11 MIL/uL   Hemoglobin 12.1 12.0 - 15.0 g/dL   HCT 91.4 78.2 - 95.6 %   MCV 88.4 80.0 - 100.0 fL   MCH 28.1 26.0 - 34.0 pg   MCHC  31.8 30.0 - 36.0 g/dL   RDW 16.1 09.6 - 04.5 %   Platelets 239 150 - 400 K/uL   nRBC 0.0 0.0 - 0.2 %    Comment: Performed at [facility], [address] [place], [place] [address]  TSH     Status: None   Collection Time: 06/01/23  9:52 AM  Result Value Ref Range   TSH 1.945 0.350 - 4.500 uIU/mL    Comment: Performed by a 3rd Generation assay with a functional sensitivity of <=0.01 uIU/mL. Performed at [facility], [address], [place], [place] [address]   Magnesium     Status: None   Collection Time: 06/01/23  9:52 AM  Result Value Ref Range   Magnesium 2.3 1.7 - 2.4 mg/dL    Comment: Performed at [facility], [address], [place], [place] [address]  Urinalysis, Routine w reflex microscopic -Urine, Clean Catch      Status: Abnormal   Collection Time: 06/01/23 11:35 AM  Result Value Ref Range   Color, Urine YELLOW YELLOW   APPearance HAZY (A) CLEAR   Specific Gravity, Urine 1.014 1.005 - 1.030   pH 7.0 5.0 - 8.0   Glucose, UA NEGATIVE NEGATIVE mg/dL   Hgb urine dipstick NEGATIVE NEGATIVE   Bilirubin Urine NEGATIVE NEGATIVE   Ketones, ur NEGATIVE NEGATIVE mg/dL   Protein, ur 30 (A) NEGATIVE mg/dL   Nitrite POSITIVE (A) NEGATIVE   Leukocytes,Ua LARGE (A) NEGATIVE   RBC / HPF 21-50 0 - 5 RBC/hpf   WBC, UA >50 0 - 5 WBC/hpf   Bacteria, UA FEW (A) NONE SEEN   Squamous Epithelial / HPF 0-5 0 - 5 /HPF   WBC Clumps PRESENT    Hyaline Casts, UA PRESENT    Non Squamous Epithelial 0-5 (A) NONE SEEN    Comment: Performed at [facility], [address] [place], [place] [address]  CBG monitoring, ED     Status: None   Collection Time: 06/01/23 12:09 PM  Result Value Ref Range   Glucose-Capillary 78 70 - 99 mg/dL    Comment: Glucose reference range applies only to samples taken after fasting for at least 8 hours.   Comment 1 Notify RN    Comment 2 Document in Chart     Medications:  Current Facility-Administered Medications  Medication Dose Route Frequency Provider Last Rate Last Admin  . acetaminophen (TYLENOL) tablet 1,000 mg  1,000 mg Oral Once Long, Arlyss Repress, MD      . amitriptyline (ELAVIL) tablet 50 mg  50 mg Oral QHS Terrilee Files, MD      . Melene Muller ON 06/02/2023] calcium-vitamin D (OSCAL WITH D) 500-5 MG-MCG per tablet 1 tablet  1 tablet Oral Daily Terrilee Files, MD      . donepezil (ARICEPT) tablet 5 mg  5 mg Oral QHS Terrilee Files, MD      . fosfomycin (MONUROL) packet 3 g  3 g Oral Once Cristopher Peru, PA-C      . [START ON 06/02/2023] furosemide (LASIX) tablet 20 mg  20 mg Oral q morning Terrilee Files, MD      . montelukast (SINGULAIR) tablet 10 mg  10 mg Oral QHS Terrilee Files, MD      . Melene Muller ON 06/02/2023] Naldemedine Tosylate TABS 1 tablet  1 tablet Oral  Daily Terrilee Files, MD      . oxyCODONE-acetaminophen (PERCOCET/ROXICET) 5-325 MG per tablet 1  tablet  1 tablet Oral Q6H PRN Terrilee Files, MD      . Melene Muller ON 06/02/2023] polyethylene glycol (MIRALAX / GLYCOLAX) packet 17 g  17 g Oral Daily Terrilee Files, MD      . Melene Muller ON 06/02/2023] potassium citrate (UROCIT-K) SR tablet 10 mEq  10 mEq Oral TID WC Terrilee Files, MD      . pregabalin (LYRICA) capsule 100 mg  100 mg Oral BID Terrilee Files, MD      . Melene Muller ON 06/02/2023] rosuvastatin (CRESTOR) tablet 5 mg  5 mg Oral q morning Terrilee Files, MD      . Melene Muller ON 06/02/2023] venlafaxine XR (EFFEXOR-XR) 24 hr capsule 150 mg  150 mg Oral Q breakfast Terrilee Files, MD      . verapamil (CALAN-SR) CR tablet 240 mg  240 mg Oral QPM Terrilee Files, MD       Current Outpatient Medications  Medication Sig Dispense Refill  . acetaminophen (TYLENOL) 500 MG tablet Take 500 mg by mouth in the morning and at bedtime.    Marland Kitchen albuterol (VENTOLIN HFA) 108 (90 Base) MCG/ACT inhaler Inhale 1 puff into the lungs every 6 (six) hours as needed for wheezing or shortness of breath.    Marland Kitchen amitriptyline (ELAVIL) 25 MG tablet Take 50 mg by mouth at bedtime.     . Calcium Carb-Cholecalciferol (CALCIUM 500+D3) 500-400 MG-UNIT TABS Take 1 tablet by mouth daily.    Marland Kitchen desvenlafaxine (PRISTIQ) 100 MG 24 hr tablet Take 100 mg by mouth daily.    . diclofenac Sodium (VOLTAREN) 1 % GEL Apply 2 g topically daily as needed (For back and wrist pain).    Marland Kitchen donepezil (ARICEPT) 5 MG tablet Take 5 mg by mouth at bedtime.    . ferrous sulfate 325 (65 FE) MG tablet Take 325 mg by mouth daily with breakfast.    . fexofenadine (ALLEGRA) 180 MG tablet Take 180 mg by mouth daily.    . furosemide (LASIX) 20 MG tablet Take 20 mg by mouth every morning.     Marland Kitchen HUMIRA PEN 40 MG/0.4ML PNKT Inject 40 mg into the skin every 14 (fourteen) days.    . montelukast (SINGULAIR) 10 MG tablet Take 10 mg by mouth at bedtime.     .  Multiple Vitamins-Minerals (MULTIVITAMIN PO) Take 1 tablet by mouth daily.     Marcene Duos Tosylate (SYMPROIC) 0.2 MG TABS Take 1 tablet by mouth daily.    Marland Kitchen oxyCODONE-acetaminophen (PERCOCET/ROXICET) 5-325 MG tablet Take 1 tablet by mouth See admin instructions. Take one tablet by mouth 5 times a day    . polyethylene glycol powder (MIRALAX) 17 GM/SCOOP powder Take 17 g by mouth daily.     . potassium citrate (UROCIT-K) 10 MEQ (1080 MG) SR tablet Take 10 mEq by mouth 3 (three) times daily with meals.     . pregabalin (LYRICA) 100 MG capsule Take 100 mg by mouth 2 (two) times daily.    . rosuvastatin (CRESTOR) 10 MG tablet Take 5 mg by mouth every morning.     . verapamil (VERELAN PM) 240 MG 24 hr capsule Take 240 mg by mouth every evening.      Musculoskeletal: pt seen standing and independently walking in the exam room.  She moves at moderate pace, appears steady on her feet.  Her son is standing by but does not offer assistance, Per PT notes, pt requires assistance with transferring Strength & Muscle Tone:  unable  to fully assess d/t video visit Gait & Station:  as outlined above Patient leans:  appears to lean to the right.     Psychiatric Specialty Exam:  Presentation  General Appearance: No data recorded Eye Contact:No data recorded Speech:No data recorded Speech Volume:No data recorded Handedness:No data recorded  Mood and Affect  Mood:Dysphoric  Affect:Blunt; Congruent   Thought Process  Thought Processes:Irrevelant; Disorganized  Descriptions of Associations:Tangential  Orientation:Partial  Thought Content:Tangential; Illogical  History of Schizophrenia/Schizoaffective disorder:No data recorded Duration of Psychotic Symptoms:No data recorded Hallucinations:Hallucinations: None  Ideas of Reference:None  Suicidal Thoughts:Suicidal Thoughts: No  Homicidal Thoughts:Homicidal Thoughts: No   Sensorium  Memory:Immediate Poor; Recent Poor; Remote  Poor  Judgment:Poor  Insight:Lacking   Executive Functions  Concentration:No data recorded Attention Span:Poor  Recall:Poor  Fund of Knowledge:Poor  Language:Good   Psychomotor Activity  Psychomotor Activity:Psychomotor Activity: Normal   Assets  Assets:Desire for Improvement; Financial Resources/Insurance; Housing; Social Support   Sleep  Sleep:Sleep: Fair Number of Hours of Sleep: 6    Physical Exam: Physical Exam Cardiovascular:     Rate and Rhythm: Normal rate.     Pulses: Normal pulses.  Pulmonary:     Effort: Pulmonary effort is normal.  Musculoskeletal:        General: Normal range of motion.     Cervical back: Normal range of motion.  Neurological:     Mental Status: She is alert. Mental status is at baseline. She is disoriented.  Psychiatric:        Attention and Perception: Attention normal.        Mood and Affect: Mood is anxious. Affect is blunt.        Speech: Speech is tangential.        Behavior: Behavior is withdrawn. Behavior is cooperative.        Thought Content: Thought content is delusional. Thought content is not paranoid. Thought content does not include homicidal or suicidal ideation. Thought content does not include homicidal or suicidal plan.        Cognition and Memory: Cognition is impaired. Memory is impaired. She exhibits impaired recent memory and impaired remote memory.        Judgment: Judgment is impulsive.    Review of Systems  Constitutional: Negative.   HENT: Negative.    Eyes: Negative.   Respiratory: Negative.    Cardiovascular: Negative.   Gastrointestinal: Negative.   Genitourinary: Negative.   Musculoskeletal:  Positive for myalgias (pt reports chronic pain for which she is followed by her primary care provider).  Skin: Negative.   Neurological: Negative.   Endo/Heme/Allergies: Negative.   Psychiatric/Behavioral:  Positive for memory loss. The patient is nervous/anxious.     Blood pressure (!) 141/87, pulse  70, temperature 97.9 F (36.6 C), temperature source Oral, resp. rate 16, last menstrual period 12/21/2015, SpO2 100%. There is no height or weight on file to calculate BMI.  Treatment Plan Summary: Patient with hx for dementia, presents to the emergency department accompanied by her son for evaluation or suicidal ideation and worsening dementia.  On assessment, pt is oriented to herself only, unable to state location, date or time. However, she verbalizes she has memory deficits and frequently looks to her son for assistance in answering questions.    Per collateral received by her son, Dominique Evans who is at the bedside. He reports his mother has suicidal ideations, passive but no plan or intent to end her life. He does not like that she's been asking him to kill her  but he reports patient has religious protective factors that are safeguards against self harm.  He does not believe she will intentionally do anything to hurt herself.   Patient is able to perform basic grooming activities but is otherwise limited.  Pt's impaired cognitive function, limits her ability to make good decisions. While she acknowledges she has memory deficits, she is also impulsive and lacks insight -all of which makes her a safety risk if left at home alone. Safety planning completed, Saif reports he has already dismantled is firearms, removed scissors, knifes and sharp objects out of patient's reach.   Considering above, patient may need higher level of care due to worsening dementia. Patient behaviors are consistent with a person with dementia and other memory loss.  Patient not recommended for psychiatric inpatient admission but for a memory care facility.  Discussed memory care as an appropriate option as they are well equipped to help elderly persons with dementia, maintain cognitive skills and help with quality of life.    Patient is psychiatrically cleared. TOC consult has already been entered by the ED provider.   Disposition:  Patient does not meet criteria for psychiatric inpatient admission. Supportive therapy provided about ongoing stressors. Discussed crisis plan, support from social network, calling 911, coming to the Emergency Department, and calling Suicide Hotline.  This service was provided via telemedicine using a 2-way, interactive audio and video technology.  Spoke with Dr. Ivin Booty ; informed of above recommendation and disposition  Names of all persons participating in this telemedicine service and their role in this encounter. Name: Dominique Evans Role: Patient  Name: Benn Moulder  Role: Pt's Son  Name: Ophelia Shoulder Role: PMHNP  Name: Nelly Rout Role: Psychiatrist    Chales Abrahams, NP 06/01/2023 6:20 PM

## 2023-06-01 NOTE — Evaluation (Addendum)
Physical Therapy Evaluation Patient Details Name: Dominique Evans MRN: [id number] DOB: 28-Jul-1966 Today's Date: 06/01/2023  History of Present Illness  57 y.o. female [facility] with behavioral concerns. PMH includes DM, HLD, thoracic aortic aneurysm, GERD, HTN, OSA, dementia.  Clinical Impression  Pt presents to PT with deficits in cognition, safety awareness, balance, gait, endurance. Pt's son reports chronic balance deficits with a history of falls. Pt typically ambulates with UE support of furniture, only using her walker outdoors. Pt appears to be mobilizing near her baseline, ambulating for household distances at this time. PT does not significant deficits in cognition at this time, unable to recall her date of birth or many history questions. Pt will benefit from increased supervision at the time of discharge to improve safety. PT services for balance training may be beneficial at the time of discharge, whether in an inpatient setting or at home. If pt has increased supervision then these services could be performed in the home or outpatient setting.      If plan is discharge home, recommend the following: A little help with walking and/or transfers;A lot of help with bathing/dressing/bathroom;Assistance with cooking/housework;Direct supervision/assist for medications management;Direct supervision/assist for financial management;Assist for transportation;Help with stairs or ramp for entrance;Supervision due to cognitive status   Can travel by private vehicle   Yes    Equipment Recommendations None recommended by PT  Recommendations for Other Services       Functional Status Assessment Patient has had a recent decline in their functional status and demonstrates the ability to make significant improvements in function in a reasonable and predictable amount of time.     Precautions / Restrictions Precautions Precautions: Fall Precaution Comments: hx of falls per  son Restrictions Weight Bearing Restrictions: No      Mobility  Bed Mobility Overal bed mobility: Modified Independent                  Transfers Overall transfer level: Needs assistance Equipment used: None Transfers: Sit to/from Stand Sit to Stand: Supervision                Ambulation/Gait Ambulation/Gait assistance: Contact guard assist Gait Distance (Feet): 200 Feet Assistive device: None Gait Pattern/deviations: Step-through pattern, Staggering left Gait velocity: functional Gait velocity interpretation: 1.31 - 2.62 ft/sec, indicative of limited community ambulator   General Gait Details: pt with one instance of leftward LOB, otherwise ambulates with step-through gait and persistent LLE external rotation  Stairs            Wheelchair Mobility     Tilt Bed    Modified Rankin (Stroke Patients Only)       Balance Overall balance assessment: Needs assistance Sitting-balance support: No upper extremity supported, Feet supported Sitting balance-Leahy Scale: Good     Standing balance support: No upper extremity supported, During functional activity Standing balance-Leahy Scale: Fair                               Pertinent Vitals/Pain Pain Assessment Pain Assessment: No/denies pain    Home Living Family/patient expects to be discharged to:: Private residence Living Arrangements: Children Available Help at Discharge: Family;Available PRN/intermittently (son works long hours, pt home alone otherwise) Type of Home: Apartment Home Access: Level entry       Home Layout: One level Home Equipment: Agricultural consultant (2 wheels);Cane - single point      Prior Function Prior Level of Function :  Needs assist             Mobility Comments: ambulatory in the apartment with support of furniture, son reports a history of falls ADLs Comments: requires assistance for IADLs, typically independent with ADLs     Extremity/Trunk  Assessment   Upper Extremity Assessment Upper Extremity Assessment: Overall WFL for tasks assessed    Lower Extremity Assessment Lower Extremity Assessment: Generalized weakness    Cervical / Trunk Assessment Cervical / Trunk Assessment: Normal  Communication   Communication Communication: Difficulty communicating thoughts/reduced clarity of speech Cueing Techniques: Verbal cues  Cognition Arousal: Alert Behavior During Therapy: WFL for tasks assessed/performed Overall Cognitive Status: Impaired/Different from baseline Area of Impairment: Orientation, Attention, Memory, Following commands, Safety/judgement, Awareness, Problem solving                 Orientation Level: Disoriented to, Place, Time, Situation (pt only able to recall birth month) Current Attention Level: Sustained Memory: Decreased recall of precautions, Decreased short-term memory Following Commands: Follows one step commands consistently, Follows multi-step commands with increased time Safety/Judgement: Decreased awareness of safety, Decreased awareness of deficits Awareness: Intellectual Problem Solving: Slow processing          General Comments General comments (skin integrity, edema, etc.): VSS on RA    Exercises     Assessment/Plan    PT Assessment Patient needs continued PT services  PT Problem List Decreased activity tolerance;Decreased balance;Decreased mobility;Decreased knowledge of use of DME;Decreased safety awareness;Decreased knowledge of precautions       PT Treatment Interventions DME instruction;Gait training;Functional mobility training;Therapeutic activities;Therapeutic exercise;Balance training;Neuromuscular re-education;Cognitive remediation;Patient/family education    PT Goals (Current goals can be found in the Care Plan section)  Acute Rehab PT Goals Patient Stated Goal: to have increased support, improve patient safety PT Goal Formulation: With patient/family Time For Goal  Achievement: 06/15/23 Potential to Achieve Goals: Fair Additional Goals Additional Goal #1: Pt will score >19/24 on the DGI to indicate a reduced risk for falls Additional Goal #2: Pt will score >45/56 on the BERG to indicate a reduced risk for falls    Frequency Min 1X/week     Co-evaluation               AM-PAC PT "6 Clicks" Mobility  Outcome Measure Help needed turning from your back to your side while in a flat bed without using bedrails?: None Help needed moving from lying on your back to sitting on the side of a flat bed without using bedrails?: None Help needed moving to and from a bed to a chair (including a wheelchair)?: A Little Help needed standing up from a chair using your arms (e.g., wheelchair or bedside chair)?: A Little Help needed to walk in hospital room?: A Little Help needed climbing 3-5 steps with a railing? : A Little 6 Click Score: 20    End of Session   Activity Tolerance: Patient tolerated treatment well Patient left: in bed;with call bell/phone within reach;with family/visitor present Nurse Communication: Mobility status PT Visit Diagnosis: Other abnormalities of gait and mobility (R26.89)    Time: 1419-1440 PT Time Calculation (min) (ACUTE ONLY): 21 min   Charges:   PT Evaluation $PT Eval Low Complexity: 1 Low   PT General Charges $$ ACUTE PT VISIT: 1 Visit         Arlyss Gandy, PT, DPT Acute Rehabilitation Office [phone]   Arlyss Gandy 06/01/2023, 3:11 PM

## 2023-06-01 NOTE — TOC Initial Note (Signed)
Transition of Care [facility]) - Initial/Assessment Note    Patient Details  Name: Dominique Evans MRN: [id number] Date of Birth: [birth year]  Transition of Care [facility]) CM/SW Contact:    Susa Simmonds, LCSWA Phone Number: 06/01/2023, 1:53 PM  Clinical Narrative:  CSW spoke with patients son Benn Moulder at bedside. Burna Sis stated his mother has dementia and its becoming more difficult to care for patient at home. Patients son stated he works 12-17 hours per day and its not safe to leave his mother at home. Burna Sis stated he has caught his mother eating raw chicken when he returned home from work. Burna Sis stated his brother Samuel Bouche has tried to get patient into [facility] but they were unable to take patient because of her medicaid. Burna Sis stated his mother walks with a walker at home and is unsteady on her feet at times. PT consult is pending to see if patients qualifies for skilled nursing. CSW spoke with Revonda Standard at [place] [facility] [place] [facility] to see if they had space available in their locked unit for long term care. CSW will fax over referrals.              Expected Discharge Plan: Skilled Nursing Facility Barriers to Discharge: Continued Medical Work up   Patient Goals and CMS Choice Patient states their goals for this hospitalization and ongoing recovery are:: Nursing facility          Expected Discharge Plan and Services       Living arrangements for the past 2 months: Single Family Home                                      Prior Living Arrangements/Services Living arrangements for the past 2 months: Single Family Home Lives with:: Adult Children Patient language and need for interpreter reviewed:: Yes        Need for Family Participation in Patient Care: Yes (Comment) Care giver support system in place?: Yes (comment)   Criminal Activity/Legal Involvement Pertinent to Current Situation/Hospitalization: No - Comment as needed  Activities of Daily Living       Permission Sought/Granted                  Emotional Assessment Appearance:: Appears older than stated age Attitude/Demeanor/Rapport: Engaged Affect (typically observed): Calm Orientation: : Oriented to Self Alcohol / Substance Use: Not Applicable Psych Involvement: No (comment)  Admission diagnosis:  dementia Patient Active Problem List   Diagnosis Date Noted   Dementia (HCC) 06/01/2023   Cellulitis of ear, right 02/15/2020   DM (diabetes mellitus), type 2 (HCC) 01/24/2020   SIRS (systemic inflammatory response syndrome) (HCC) 01/24/2020   Facial cellulitis 01/24/2020   Mild neurocognitive disorder, unclear etiology 10/25/2019   Pain due to onychomycosis of toenails of both feet 09/22/2019   Diabetic neuropathy (HCC) 09/22/2019   Pre-ulcerative corn or callous 09/22/2019   Esophageal dysphagia 06/10/2019   Intractable nausea and vomiting 06/09/2019   Cellulitis of left foot 04/27/2019   Hyperlipidemia 02/16/2019   Chronic pain 09/24/2018   Incisional hernia 09/24/2018   Low back pain 09/24/2018   Major depressive disorder with single episode 09/24/2018   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   Esophageal dysmotility 03/01/2015   Lateral ventral hernia 03/01/2015   S/P lumbar spinal fusion 05/23/2014   Edema 06/28/2013   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   HNP (herniated nucleus pulposus), lumbar 11/30/2012  Thoracic aortic aneurysm 10/23/2010   Constipation, chronic 03/23/2010   Obesity, unspecified 10/31/2007   Obstructive sleep apnea 10/31/2007   Chronic rhinitis 10/31/2007   Cough variant asthma 10/31/2007   GERD (gastroesophageal reflux disease) 10/31/2007   Type 2 diabetes mellitus with foot ulcer (HCC) 10/04/2007   Hypertension associated with diabetes (HCC) 10/04/2007   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/04/2007   PCP:  [name], MD Pharmacy:   [phone] [place], [place] - [address] AT CORNER OF  [facility] [address] [place] [address] Phone: [phone] Fax: [phone]  [phone] - [address] [place] [address] Phone: [phone] Fax: [phone]     Social Determinants of Health (SDOH) Social History: SDOH Screenings   Food Insecurity: No Food Insecurity (01/14/2022)   Received from [facility], [facility]  Transportation Needs: No Transportation Needs (01/14/2022)   Received from [facility], [facility]  Financial Resource Strain: Low Risk  (01/14/2022)   Received from [facility], [facility]  Physical Activity: Inactive (01/14/2022)   Received from [facility], [facility]  Tobacco Use: Medium Risk (06/01/2023)  Health Literacy: High Risk (01/14/2022)   Received from [facility], [facility]   SDOH Interventions:     Readmission Risk Interventions     No data to display

## 2023-06-01 NOTE — ED Provider Notes (Addendum)
[facility] EMERGENCY DEPARTMENT AT [facility] Provider Note   CSN: [id number] Arrival date & time: 06/01/23  7829     History  Chief Complaint  Patient presents with   Worsening Dementia    Dominique Evans is a 57 y.o. female.  With past medical history of diabetes, hyperlipidemia, thoracic aortic aneurysm, GERD, hypertension, obstructive sleep apnea, cognitive disorder who presents to the emergency department with behavioral concern.  Presents with son who provides the history.  He states that in essence he cannot take care of his mother anymore.  Dates that patient was living with his sister, the patient's daughter, up until about 3 weeks ago when he took over care.  He states that even since starting to care for her she has had significant decline.  She has known dementia and has been taking her medications but having concerning behavior.  He states that he unfortunately has to work 12 to 15 hours a day away from the house.  During these periods, the patient is home alone.  He states that he has come home before and noticed that she has eaten raw meat or other and notable foods.  At times she has wandered out of the house.  He states that if her skin itches she has taken scissors and cut her skin.  He states recently things have become combative.  He also states that daily she is asking him to kill her.  He does note that she takes her medications, she can eat and drink without assistance and can shower.  He states that family has been trying to find placement for her but has had difficulty with this.  He does not feel that she can come back home with him.   HPI     Home Medications Prior to Admission medications   Medication Sig Start Date End Date Taking? Authorizing Provider  acetaminophen (TYLENOL) 500 MG tablet Take 500 mg by mouth in the morning and at bedtime.    [provider]  amitriptyline (ELAVIL) 25 MG tablet Take 50 mg by mouth at bedtime.     [provider]  Calcium Carb-Cholecalciferol (CALCIUM 500+D3) 500-400 MG-UNIT TABS Take 1 tablet by mouth daily.    [provider]  desvenlafaxine (PRISTIQ) 50 MG 24 hr tablet Take 50 mg by mouth daily.    [provider]  ferrous sulfate 325 (65 FE) MG tablet Take 325 mg by mouth daily with breakfast.    [provider]  fexofenadine (ALLEGRA) 180 MG tablet Take 180 mg by mouth daily.    [provider]  Fluticasone-Salmeterol (ADVAIR DISKUS) 250-50 MCG/DOSE AEPB Inhale 1 puff into the lungs 2 (two) times daily.     [provider]  furosemide (LASIX) 20 MG tablet Take 20 mg by mouth every morning.     [provider]  HUMIRA PEN 40 MG/0.4ML PNKT Inject 40 mg into the skin every 14 (fourteen) days. 01/05/20   [provider]  lansoprazole (PREVACID) 30 MG capsule TAKE 1 CAPSULE (30 MG TOTAL) BY MOUTH 2 (TWO) TIMES DAILY BEFORE A MEAL. 06/04/21   Napoleon Form, MD  losartan (COZAAR) 50 MG tablet Take 50 mg by mouth daily. 02/07/20   [provider]  montelukast (SINGULAIR) 10 MG tablet Take 10 mg by mouth at bedtime.     [provider]  Multiple Vitamins-Minerals (MULTIVITAMIN PO) Take 1 tablet by mouth daily.     [provider]  oxyCODONE-acetaminophen (PERCOCET/ROXICET) 5-325  MG tablet Take 1 tablet by mouth 5 (five) times daily.     [provider]  polyethylene glycol powder (MIRALAX) 17 GM/SCOOP powder Take 17 g by mouth daily.     [provider]  potassium citrate (UROCIT-K) 10 MEQ (1080 MG) SR tablet Take 10 mEq by mouth 3 (three) times daily with meals.     [provider]  pregabalin (LYRICA) 100 MG capsule Take 100 mg by mouth 2 (two) times daily. 01/11/20   [provider]  rosuvastatin (CRESTOR) 10 MG tablet Take 5 mg by mouth every morning.     [provider]  verapamil (VERELAN PM) 240 MG 24 hr capsule Take 240 mg by mouth daily. 01/02/20    [provider]  pantoprazole (PROTONIX) 20 MG tablet Take 1 tablet (20 mg total) by mouth daily. 06/09/19 06/09/19  Shanon Ace, PA-C      Allergies    Penicillins, Sulfonamide derivatives, Clindamycin/lincomycin, Sulfa antibiotics, and Ciprofloxacin    Review of Systems   Review of Systems  Unable to perform ROS: Dementia    Physical Exam Updated Vital Signs BP (!) 141/87 (BP Location: Right Arm)   Pulse 70   Temp 97.9 F (36.6 C) (Oral)   Resp 16   LMP 12/21/2015   SpO2 100%  Physical Exam Vitals and nursing note reviewed.  Constitutional:      General: She is not in acute distress.    Appearance: Normal appearance.  HENT:     Head: Normocephalic.  Eyes:     General: No scleral icterus.    Extraocular Movements: Extraocular movements intact.     Pupils: Pupils are equal, round, and reactive to light.  Cardiovascular:     Rate and Rhythm: Normal rate and regular rhythm.  Pulmonary:     Effort: Pulmonary effort is normal. No respiratory distress.  Abdominal:     Palpations: Abdomen is soft.  Skin:    General: Skin is warm and dry.     Capillary Refill: Capillary refill takes less than 2 seconds.  Neurological:     Mental Status: She is alert. She is confused.     Cranial Nerves: Cranial nerves 2-12 are intact.     Motor: Motor function is intact.     Comments: Disoriented to self, time, place.  Psychiatric:        Attention and Perception: Attention normal.        Speech: Speech normal.        Behavior: Behavior is cooperative.        Cognition and Memory: Cognition is impaired. Memory is impaired.     ED Results / Procedures / Treatments   Labs (all labs ordered are listed, but only abnormal results are displayed) Labs Reviewed  COMPREHENSIVE METABOLIC PANEL - Abnormal; Notable for the following components:      Result Value   Total Bilirubin 0.2 (*)    All other components within normal limits  URINALYSIS, ROUTINE W REFLEX MICROSCOPIC  - Abnormal; Notable for the following components:   APPearance HAZY (*)    Protein, ur 30 (*)    Nitrite POSITIVE (*)    Leukocytes,Ua LARGE (*)    Bacteria, UA FEW (*)    Non Squamous Epithelial 0-5 (*)    All other components within normal limits  CBC  TSH  MAGNESIUM  CBG MONITORING, ED    EKG None  Radiology No results found.  Procedures Procedures   Medications Ordered in ED Medications  fosfomycin (MONUROL) packet 3 g (has no administration in time range)  acetaminophen (TYLENOL) tablet 1,000 mg (has no administration in time range)    ED Course/ Medical Decision Making/ A&P    Medical Decision Making Amount and/or Complexity of Data Reviewed Labs: ordered.  Risk OTC drugs. Prescription drug management.  Initial Impression and Ddx 57 year old female who presents to the emergency department with behavioral concern, dementia concern Patient PMH that increases complexity of ED encounter: Diabetes, hypertension, hyperlipidemia, obstructive sleep apnea  Interpretation of Diagnostics I independent reviewed and interpreted the labs as followed: CMP with no acute findings.  CBC with no acute findings.  Glucose is normal.  - I independently visualized the following imaging with scope of interpretation limited to determining acute life threatening conditions related to emergency care: Not indicated  Patient Reassessment and Ultimate Disposition/Management 57 year old female who presents to the emergency department with son.  She is overall stable appearing.  Concerns for living situation.  Will obtain screening labs to ensure there is nothing organic going on to explain her decline.  If negative this is all likely sequela of her dementia.  Will consult TTS regarding her SI.  Will contact social work regarding placement.  CBC and CMP with no acute findings.  Glucose is normal. UA with UTI. Given one time dose of fosfomycin given allergies. TSH and mag are normal.  TTS  will see patient to formally clear but suspect behavior is all sequelae of dementia.   I have Susa Simmonds with social work/case management working to find placement for patient. Unfortunately, she is not a safe discharge home as no one is there to watch her during the day and she is unable to feed herself, take medications, etc. She requested PT eval which I also placed. Her disposition is pending formal placement in facility vs. [facility] in the ED until then.   Patient management required discussion with the following services or consulting groups:  Case Management/Social Work and Psychiatry/TTS  Complexity of Problems Addressed Chronic illness with severe exacerbation  Additional Data Reviewed and Analyzed Further history obtained from: Further history from spouse/family member, Past medical history and medications listed in the EMR, Prior ED visit notes, and Care Everywhere  Patient Encounter Risk Assessment SDOH impact on management and Consideration of hospitalization  Final Clinical Impression(s) / ED Diagnoses Final diagnoses:  Severe dementia with other behavioral disturbance, unspecified dementia type [facility])    Rx / DC Orders ED Discharge Orders     None         Cristopher Peru, PA-C 06/01/23 1431    Cristopher Peru, PA-C 06/01/23 1521    Long, Arlyss Repress, MD 06/04/23 [phone]

## 2023-06-01 NOTE — Progress Notes (Signed)
Patient's called this CSW wanting an update. This CSW explained to the patient that the AM CSW will follow up with family in the Morning however the patient will be in the ED for the night. TOC will follow.

## 2023-06-01 NOTE — Progress Notes (Signed)
CSW received information from [place]ita with admissions from [facility] and [place] with admissions at [place], [place], [place], and [place]. CSW was told that they looked up patients medicaid and its showing that patient has not been deemed disabled and they do not see where patient collects social security. The facilities are not able to take patient due to no payor source. CSW spoke with patients son Dominique Evans and Dominique Evans. Dominique Evans states his mother does have disability and received $1992 a month. CSW told Dominique Evans he will have to get a verification letter from social security showing that she has disability to be considered for long term care. CSW told Dominique Evans he can use his mother's money and pay for an aide to assist them in the home while his brother Dominique Evans works overnight. Dominique Evans stated he would be interested in doing that. CSW sent Dominique Evans private duty agencies and the information for pace of the traid. CSW also is having tim with [facility] reach out for assistance.     Dominique Evans called CSW back and was able to find the verification letter. The verification letter was sent to [facility] with admissions at [facility] to run patients benefits again to see if she would qualify for long term care.

## 2023-06-01 NOTE — ED Triage Notes (Addendum)
Pt came in via POV d/t reporting her dementia has worsened in the last 3 weeks. Family stating she tries to eat unsafe raw food, trying to walk out of the home, forgetting family & family has been having difficulty taking care of her. Is here for eval & request caseworker assistance for possible placement. Pt does already have caseworker but family reports any help would be helpful. Alert to self while in triage. Family reports she has clearly told them she wants them to kill her & Son at bedside reports that he cannot continue to take care of her.

## 2023-06-01 NOTE — Consult Note (Signed)
Telepsych Consultation   Reason for Consult:  "suicidal ideations, underlying dementia." Referring Physician:  Mertha Baars, PA-C Location of Patient:    [facility] Location of Provider: Other: [facility]  Patient Identification: Dominique Evans MRN:  [id number] Principal Diagnosis: <principal problem not specified> Diagnosis:  Active Problems:   Dementia (HCC)   Total Time spent with patient: 1 hour  Subjective:   Dominique Evans is a 57 y.o. female patient admitted with  Per RN Triage Note dated 06/01/2023@0934 : "Pt came in via POV d/t reporting her dementia has worsened in the last 3 weeks. Family stating she tries to eat unsafe raw food, trying to walk out of the home, forgetting family & family has been having difficulty taking care of her. Is here for eval & request caseworker assistance for possible placement. Pt does already have caseworker but family reports any help would be helpful. Alert to self while in triage. Family reports she has clearly told them she wants them to kill her & Son at bedside reports that he cannot continue to take care of her. "   HPI:   Patient seen via telepsych by this provider; chart reviewed and consulted with Dr. Lucianne Muss on 06/02/23.  On evaluation Dominique Evans is seen sitting on bed; she is appropriately groomed, hair appears combed; she is wearing a hospital gown. Pt greeted by this Clinical research associate and given anticipatory guidance.  When asked how she's doing today she replies, "Not good, I don't remember things, I've been forgetting things a lot."  She is alert and oriented to self only.  She acknowledges she does not the time, or date and observed frequently asking her son, "can you answer that? Do you know where we are?" She is aware her son is in the room with her; She states his name but then asks him for reassurance.  While talking with patient trying to ascertain her level of functioning,  she abruptly stands up and walks around the room  independently, and states, "see I can do that on my own.   Pt appears confused, her speech is disorganized and tangenitial.  When asked if she has suicidal thoughts patient states, "what's that?  I don't think so."  Collateral from her son, whom states his mother began living with him within the past 3 weeks.  States since that time, daily, she has asked him to kill her; when visitors are there, she has also asked them to kill her as well. He describes his mother as a religious person who shared she would not kill herself because she views it as a sin.  He states she has not demonstrated suicidal behaviors.  He recalls intermittently she complains her skin itches and he has found her using a pair of scissors to scratch her itch.  He reports she's had superficial redness not does not report tear in skin integrity.  He also reports he does not believe above behavior was rooted in suicidal attempt.   He works 6pm to 6pm shifts, occasionally  has to work later.  During that time, she is home alone.  He states pt is able to wash and groom herself appropriately; he has not noticed any deficits in that area. He reports intermittently returning home to find pt has eaten things such as raw meat or ingredients that should be cooked prior to consuming.   He has firearms but when his mom arrived, he dismantled them and placed the parts in various areas throughout his home.  He has removed scissors and sharp items out of is mother's reach "not because I think she's suicidal but because her memory is not good and she may over do it when using these items."  Pt states she wants to go heaven but cannot kill herself for fear she will go to hell. States she does not drink alcohol. Stopped smoking cigarettes att age 50.  Appears 3 years ago, she was dx with neurocognitive disorder with unknown etiology.   Labs: CMP is within normal limits WBC does not show leukocytosis or anemia TSH is within normal limits at 1.945 U/A  is positive for leukocytes and nitrates suggestive of UTI. She was treated with fosfosycin 3g x one dose earlier today.   During evaluation Dominique Evans is sitting at the edge of the bed; She is alert and oriented to self only; She appers anxious but is cooperative; and mood congruent with affect.  Patient is speaking in a clear tone at moderate volume, and normal pace; with good eye contact.  Her thought process is irrelevant; There is no indication that she is currently responding to internal/external stimuli or experiencing delusional thought content.  Patient  has cognitive impairment and demonstrates difficulty in answering safety question.  Patient has remained cooperative throughout assessment and has answered questions appropriately.    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   Diabetic neuropathy (HCC) 09/22/2019   Esophageal dysmotility 03/01/2015   Esophageal dysphagia 06/10/2019   GERD (gastroesophageal reflux disease)    Head injury due to trauma    age 12, went under car while sledding, had memory loss at that time   HNP (herniated nucleus pulposus), lumbar 11/30/2012   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   Hx of hiatal hernia    Hydatidiform mole    Hyperlipidemia    Lateral ventral hernia 03/01/2015   Major depressive disorder with single episode 09/24/2018   Mild neurocognitive disorder, unclear etiology 10/25/2019   Nephrolithiasis    left kidney  removed due to  calcification    Obesity    Obstructive sleep apnea 10/31/2007   Qualifier: Diagnosis of  By: Craige Cotta MD, Vineet     Panic attacks    PONV (postoperative nausea and vomiting)    Psoriasis    Status post dilation of esophageal narrowing    Type 2 diabetes mellitus with foot ulcer (HCC) 10/04/2007   Qualifier: Diagnosis of  By: Craige Cotta MD, Vineet     Unspecified essential hypertension    Visual disturbance     Past Surgical History:  Procedure Laterality Date   BIOPSY  06/11/2019   Procedure: BIOPSY;  Surgeon: Charna Elizabeth, MD;  Location: [facility] ENDOSCOPY;  Service: Endoscopy;;   BOTOX INJECTION N/A 06/07/2015   Procedure: BOTOX INJECTION;  Surgeon: Louis Meckel, MD;  Location: [facility] ENDOSCOPY;  Service: Endoscopy;  Laterality: N/A;   COLONOSCOPY WITH  PROPOFOL N/A 06/07/2015   Procedure: COLONOSCOPY WITH PROPOFOL;  Surgeon: Louis Meckel, MD;  Location: [facility] ENDOSCOPY;  Service: Endoscopy;  Laterality: N/A;   ELBOW FRACTURE SURGERY     left   ESOPHAGEAL MANOMETRY N/A 04/08/2015   Procedure: ESOPHAGEAL MANOMETRY (EM);  Surgeon: Louis Meckel, MD;  Location: [facility] ENDOSCOPY;  Service: Endoscopy;  Laterality: N/A;   ESOPHAGOGASTRODUODENOSCOPY (EGD) WITH PROPOFOL N/A 06/07/2015   Procedure: ESOPHAGOGASTRODUODENOSCOPY (EGD) WITH PROPOFOL;  Surgeon: Louis Meckel, MD;  Location: [facility] ENDOSCOPY;  Service: Endoscopy;  Laterality: N/A;   ESOPHAGOGASTRODUODENOSCOPY (EGD) WITH PROPOFOL Left 06/11/2019   Procedure: ESOPHAGOGASTRODUODENOSCOPY (EGD) WITH PROPOFOL;  Surgeon: Charna Elizabeth, MD;  Location: [facility] ENDOSCOPY;  Service: Endoscopy;  Laterality: Left;   GASTRIC ROUX-EN-Y  10/05/2011   Procedure: LAPAROSCOPIC ROUX-EN-Y GASTRIC;  Surgeon: Mariella Saa, MD;  Location: [facility] ORS;  Service: General;  Laterality: N/A;  UPPER ENDOSCOPY   lap band removal      LAPAROSCOPIC GASTRIC BANDING  02/2006   w/ truncal vagotomy   LUMBAR LAMINECTOMY/DECOMPRESSION MICRODISCECTOMY Left 11/30/2012   Procedure:  MICRO LUMBAR DECOMPRESSION L4-5 ON THE LEFT;  Surgeon: Javier Docker, MD;  Location: [facility] ORS;  Service: Orthopedics;  Laterality: Left;  MICRO LUMBAR DECOMPRESSION L4-5 ON THE LEFT   LUMBAR LAMINECTOMY/DECOMPRESSION MICRODISCECTOMY N/A 03/30/2013   Procedure: REDO MICRO LUMBAR DECOMPRESSION L4-5;  Surgeon: Javier Docker, MD;  Location: [facility] ORS;  Service: Orthopedics;  Laterality: N/A;   MAXIMUM ACCESS (MAS)POSTERIOR LUMBAR INTERBODY FUSION (PLIF) 1 LEVEL N/A 05/23/2014   Procedure: FOR MAXIMUM ACCESS (MAS) POSTERIOR LUMBAR INTERBODY FUSION (PLIF) Lumbar Four-Five;  Surgeon: Tia Alert, MD;  Location: [facility] NEURO ORS;  Service: Neurosurgery;  Laterality: N/A;  FOR MAXIMUM ACCESS (MAS) POSTERIOR LUMBAR INTERBODY FUSION (PLIF) Lumbar Four-Five   NEPHRECTOMY  2002   left   PATENT DUCTUS ARTERIOUS REPAIR     SHOULDER ARTHROSCOPY     right shoulder   Family History:  Family History  Problem Relation Age of Onset   Hypertension Father    Skin cancer Father    Heart disease Father        before age 38   Heart attack Father    Hyperlipidemia Father    Diabetes Mother    Hypertension Mother    Skin cancer Mother    Breast cancer Mother        Melanoma   Heart disease Mother    Hyperlipidemia Mother    Lung cancer Mother    Brain cancer Mother    Hypertension Sister    Heart disease Sister        before age 45   Heart attack Sister    Hyperlipidemia Sister    Breast cancer Maternal Aunt    Colon cancer Neg Hx    Colon polyps Neg Hx    Esophageal cancer Neg Hx    Gallbladder disease Neg Hx    Stomach cancer Neg Hx    Family Psychiatric  History: denies Social History:  Social History   Substance and Sexual Activity  Alcohol Use No   Alcohol/week: 0.0 standard drinks of alcohol     Social History   Substance and Sexual Activity  Drug Use No    Social History   Socioeconomic History   Marital status: Divorced    Spouse name: Not on file   Number of children: 3   Years of  education: 14   Highest education level: Associate degree: academic program  Occupational History  Occupation: Sr. Engineer, maintenance: [employer]  Tobacco Use   Smoking status: Former    Current packs/day: 0.00    Types: Cigarettes    Quit date: 10/19/1982    Years since quitting: 40.6   Smokeless tobacco: Never  Vaping Use   Vaping status: Never Used  Substance and Sexual Activity   Alcohol use: No    Alcohol/week: 0.0 standard drinks of alcohol   Drug use: No   Sexual activity: Not on file  Other Topics Concern   Not on file  Social History Narrative   03/01/19 lives with son, Franky Macho   Caffeine- diet pepsi, dr pepper, coffee   Social Determinants of Health   Financial Resource Strain: Low Risk  (01/14/2022)   Received from [facility], [facility]   Overall Financial Resource Strain (CARDIA)    Difficulty of Paying Living Expenses: Not hard at all  Food Insecurity: No Food Insecurity (01/14/2022)   Received from [facility], [facility]   Hunger Vital Sign    Worried About Running Out of Food in the Last Year: Never true    Ran Out of Food in the Last Year: Never true  Transportation Needs: No Transportation Needs (01/14/2022)   Received from [facility], [facility]   [facility] - Transportation    Lack of Transportation (Medical): No    Lack of Transportation (Non-Medical): No  Physical Activity: Inactive (01/14/2022)   Received [facility], [facility]   Exercise Vital Sign    Days of Exercise per Week: 0 days    Minutes of Exercise per Session: 0 min  Stress: Not on file  Social Connections: Not on file   Additional Social History:    Allergies:   Allergies  Allergen Reactions   Penicillins Anaphylaxis    Has patient had a PCN reaction causing immediate rash, facial/tongue/throat swelling, SOB or lightheadedness with hypotension: yes Has patient had a PCN reaction causing severe rash involving mucus membranes or  skin necrosis: unknown Has patient had a PCN reaction that required hospitalization unknown Has patient had a PCN reaction occurring within the last 10 years: no If all of the above answers are "NO", then may proceed with Cephalosporin use.    Sulfonamide Derivatives Anaphylaxis   Clindamycin/Lincomycin Nausea And Vomiting   Sulfa Antibiotics Other (See Comments)    unknown   Ciprofloxacin Hives    Labs:  Results for orders placed or performed during the hospital encounter of 06/01/23 (from the past 48 hour(s))  Comprehensive metabolic panel     Status: Abnormal   Collection Time: 06/01/23  9:52 AM  Result Value Ref Range   Sodium 140 135 - 145 mmol/L   Potassium 3.9 3.5 - 5.1 mmol/L   Chloride 101 98 - 111 mmol/L   CO2 28 22 - 32 mmol/L   Glucose, Bld 84 70 - 99 mg/dL    Comment: Glucose reference range applies only to samples taken after fasting for at least 8 hours.   BUN 18 6 - 20 mg/dL   Creatinine, Ser 1.61 0.44 - 1.00 mg/dL   Calcium 8.9 8.9 - 09.6 mg/dL   Total Protein 7.4 6.5 - 8.1 g/dL   Albumin 3.8 3.5 - 5.0 g/dL   AST 20 15 - 41 U/L   ALT 19 0 - 44 U/L   Alkaline Phosphatase 98 38 - 126 U/L   Total Bilirubin 0.2 (L) 0.3 - 1.2 mg/dL  GFR, Estimated >60 >60 mL/min    Comment: (NOTE) Calculated using the CKD-EPI Creatinine Equation (2021)    Anion gap 11 5 - 15    Comment: Performed at [facility], [address] [place], [place] [address]  CBC     Status: None   Collection Time: 06/01/23  9:52 AM  Result Value Ref Range   WBC 7.3 4.0 - 10.5 K/uL   RBC 4.31 3.87 - 5.11 MIL/uL   Hemoglobin 12.1 12.0 - 15.0 g/dL   HCT 24.4 01.0 - 27.2 %   MCV 88.4 80.0 - 100.0 fL   MCH 28.1 26.0 - 34.0 pg   MCHC 31.8 30.0 - 36.0 g/dL   RDW 53.6 64.4 - 03.4 %   Platelets 239 150 - 400 K/uL   nRBC 0.0 0.0 - 0.2 %    Comment: Performed at [facility], [address], [place], [place] [address]  TSH     Status: None   Collection Time: 06/01/23  9:52 AM  Result  Value Ref Range   TSH 1.945 0.350 - 4.500 uIU/mL    Comment: Performed by a 3rd Generation assay with a functional sensitivity of <=0.01 uIU/mL. Performed at [facility], [address], [place], [place] [address]   Magnesium     Status: None   Collection Time: 06/01/23  9:52 AM  Result Value Ref Range   Magnesium 2.3 1.7 - 2.4 mg/dL    Comment: Performed at [facility], [address], [place], [place] [address]  Urinalysis, Routine w reflex microscopic -Urine, Clean Catch     Status: Abnormal   Collection Time: 06/01/23 11:35 AM  Result Value Ref Range   Color, Urine YELLOW YELLOW   APPearance HAZY (A) CLEAR   Specific Gravity, Urine 1.014 1.005 - 1.030   pH 7.0 5.0 - 8.0   Glucose, UA NEGATIVE NEGATIVE mg/dL   Hgb urine dipstick NEGATIVE NEGATIVE   Bilirubin Urine NEGATIVE NEGATIVE   Ketones, ur NEGATIVE NEGATIVE mg/dL   Protein, ur 30 (A) NEGATIVE mg/dL   Nitrite POSITIVE (A) NEGATIVE   Leukocytes,Ua LARGE (A) NEGATIVE   RBC / HPF 21-50 0 - 5 RBC/hpf   WBC, UA >50 0 - 5 WBC/hpf   Bacteria, UA FEW (A) NONE SEEN   Squamous Epithelial / HPF 0-5 0 - 5 /HPF   WBC Clumps PRESENT    Hyaline Casts, UA PRESENT    Non Squamous Epithelial 0-5 (A) NONE SEEN    Comment: Performed at [facility], [address], [place], [place] [address]  CBG monitoring, ED     Status: None   Collection Time: 06/01/23 12:09 PM  Result Value Ref Range   Glucose-Capillary 78 70 - 99 mg/dL    Comment: Glucose reference range applies only to samples taken after fasting for at least 8 hours.   Comment 1 Notify RN    Comment 2 Document in Chart     Medications:  Current Facility-Administered Medications  Medication Dose Route Frequency Provider Last Rate Last Admin   acetaminophen (TYLENOL) tablet 1,000 mg  1,000 mg Oral Once Long, Arlyss Repress, MD       amitriptyline (ELAVIL) tablet 50 mg  50 mg Oral QHS Terrilee Files, MD   50 mg at 06/01/23 2121   calcium-vitamin D (OSCAL WITH D)  500-5 MG-MCG per tablet 1 tablet  1 tablet Oral Daily Terrilee Files, MD  donepezil (ARICEPT) tablet 5 mg  5 mg Oral QHS Terrilee Files, MD   5 mg at 06/01/23 2121   furosemide (LASIX) tablet 20 mg  20 mg Oral q morning Terrilee Files, MD       montelukast (SINGULAIR) tablet 10 mg  10 mg Oral QHS Terrilee Files, MD   10 mg at 06/01/23 2121   Naldemedine Tosylate TABS 1 tablet  1 tablet Oral Daily Terrilee Files, MD       oxyCODONE-acetaminophen (PERCOCET/ROXICET) 5-325 MG per tablet 1 tablet  1 tablet Oral Q6H PRN Terrilee Files, MD   1 tablet at 06/01/23 2121   polyethylene glycol (MIRALAX / GLYCOLAX) packet 17 g  17 g Oral Daily Terrilee Files, MD       potassium citrate (UROCIT-K) SR tablet 10 mEq  10 mEq Oral TID WC Terrilee Files, MD       pregabalin (LYRICA) capsule 100 mg  100 mg Oral BID Terrilee Files, MD   100 mg at 06/01/23 2121   rosuvastatin (CRESTOR) tablet 5 mg  5 mg Oral q morning Terrilee Files, MD       venlafaxine XR (EFFEXOR-XR) 24 hr capsule 150 mg  150 mg Oral Q breakfast Terrilee Files, MD       verapamil (CALAN-SR) CR tablet 240 mg  240 mg Oral QPM Terrilee Files, MD   240 mg at 06/01/23 7124   Current Outpatient Medications  Medication Sig Dispense Refill   acetaminophen (TYLENOL) 500 MG tablet Take 500 mg by mouth in the morning and at bedtime.     albuterol (VENTOLIN HFA) 108 (90 Base) MCG/ACT inhaler Inhale 1 puff into the lungs every 6 (six) hours as needed for wheezing or shortness of breath.     amitriptyline (ELAVIL) 25 MG tablet Take 50 mg by mouth at bedtime.      Calcium Carb-Cholecalciferol (CALCIUM 500+D3) 500-400 MG-UNIT TABS Take 1 tablet by mouth daily.     desvenlafaxine (PRISTIQ) 100 MG 24 hr tablet Take 100 mg by mouth daily.     diclofenac Sodium (VOLTAREN) 1 % GEL Apply 2 g topically daily as needed (For back and wrist pain).     donepezil (ARICEPT) 5 MG tablet Take 5 mg by mouth at bedtime.     ferrous  sulfate 325 (65 FE) MG tablet Take 325 mg by mouth daily with breakfast.     fexofenadine (ALLEGRA) 180 MG tablet Take 180 mg by mouth daily.     furosemide (LASIX) 20 MG tablet Take 20 mg by mouth every morning.      HUMIRA PEN 40 MG/0.4ML PNKT Inject 40 mg into the skin every 14 (fourteen) days.     montelukast (SINGULAIR) 10 MG tablet Take 10 mg by mouth at bedtime.      Multiple Vitamins-Minerals (MULTIVITAMIN PO) Take 1 tablet by mouth daily.      Naldemedine Tosylate (SYMPROIC) 0.2 MG TABS Take 1 tablet by mouth daily.     oxyCODONE-acetaminophen (PERCOCET/ROXICET) 5-325 MG tablet Take 1 tablet by mouth See admin instructions. Take one tablet by mouth 5 times a day     polyethylene glycol powder (MIRALAX) 17 GM/SCOOP powder Take 17 g by mouth daily.      potassium citrate (UROCIT-K) 10 MEQ (1080 MG) SR tablet Take 10 mEq by mouth 3 (three) times daily with meals.      pregabalin (LYRICA) 100 MG capsule Take 100 mg by mouth 2 (  two) times daily.     rosuvastatin (CRESTOR) 10 MG tablet Take 5 mg by mouth every morning.      verapamil (VERELAN PM) 240 MG 24 hr capsule Take 240 mg by mouth every evening.      Musculoskeletal: pt seen standing and independently walking in the exam room.  She moves at moderate pace, appears steady on her feet.  Her son is standing by but does not offer assistance, Per PT notes, pt requires assistance with transferring Strength & Muscle Tone:  unable to fully assess d/t video visit Gait & Station:  as outlined above Patient leans:  appears to lean to the right.     Psychiatric Specialty Exam:  Presentation  General Appearance: Fairly Groomed  Eye Contact:Fair  Speech:Normal Rate  Speech Volume:Normal  Handedness:Right   Mood and Affect  Mood:Anxious  Affect:Blunt; Congruent   Thought Process  Thought Processes:Irrevelant; Disorganized  Descriptions of Associations:Tangential  Orientation:Partial  Thought Content:Tangential;  Illogical  History of Schizophrenia/Schizoaffective disorder:No data recorded Duration of Psychotic Symptoms:No data recorded Hallucinations:Hallucinations: None  Ideas of Reference:None  Suicidal Thoughts:Suicidal Thoughts: No  Homicidal Thoughts:Homicidal Thoughts: No   Sensorium  Memory:Immediate Poor; Recent Poor; Remote Poor  Judgment:Poor  Insight:Lacking   Executive Functions  Concentration:No data recorded Attention Span:Poor  Recall:Poor  Fund of Knowledge:Poor  Language:Good   Psychomotor Activity  Psychomotor Activity:Psychomotor Activity: Normal   Assets  Assets:Desire for Improvement; Financial Resources/Insurance; Housing; Social Support   Sleep  Sleep:Sleep: Fair Number of Hours of Sleep: 6    Physical Exam: Physical Exam Cardiovascular:     Rate and Rhythm: Normal rate.     Pulses: Normal pulses.  Pulmonary:     Effort: Pulmonary effort is normal.  Musculoskeletal:        General: Normal range of motion.     Cervical back: Normal range of motion.  Neurological:     Mental Status: She is alert. Mental status is at baseline. She is disoriented.  Psychiatric:        Attention and Perception: Attention normal.        Mood and Affect: Mood is anxious. Affect is blunt.        Speech: Speech is tangential.        Behavior: Behavior is withdrawn. Behavior is cooperative.        Thought Content: Thought content is delusional. Thought content is not paranoid. Thought content does not include homicidal or suicidal ideation. Thought content does not include homicidal or suicidal plan.        Cognition and Memory: Cognition is impaired. Memory is impaired. She exhibits impaired recent memory and impaired remote memory.        Judgment: Judgment is impulsive.    Review of Systems  Constitutional: Negative.   HENT: Negative.    Eyes: Negative.   Respiratory: Negative.    Cardiovascular: Negative.   Gastrointestinal: Negative.    Genitourinary: Negative.   Musculoskeletal:  Positive for myalgias (pt reports chronic pain for which she is followed by her primary care provider).  Skin: Negative.   Neurological: Negative.   Endo/Heme/Allergies: Negative.   Psychiatric/Behavioral:  Positive for memory loss. The patient is nervous/anxious.     Blood pressure (!) 141/87, pulse 70, temperature 97.9 F (36.6 C), temperature source Oral, resp. rate 16, last menstrual period 12/21/2015, SpO2 100%. There is no height or weight on file to calculate BMI.  Treatment Plan Summary: Patient with hx for dementia, presents to the emergency department accompanied by  her son for evaluation or suicidal ideation and worsening dementia.  On assessment, pt is oriented to herself only, unable to state location, date or time. However, she verbalizes she has memory deficits and frequently looks to her son for assistance in answering questions.    Pt's U/A demonstrates UTI, she was treated with abx earlier today.  While UTI can exacerbate patient's confusion, believe her dementia better explains her progressive concern.   Per collateral received by her son, Burna Sis who is at the bedside. He reports his mother has suicidal ideations, passive but no plan or intent to end her life. He does not like that she's been asking him to kill her but he reports patient has religious protective factors that are safeguards against self harm.  He does not believe she will intentionally do anything to hurt herself.   Patient is able to perform basic grooming activities but is otherwise limited.  Pt's impaired cognitive function, limits her ability to make good decisions. While she acknowledges she has memory deficits, she is also impulsive and lacks insight -all of which makes her a safety risk if left at home alone. Safety planning completed, Saif reports he has already dismantled his firearms, removed scissors, knifes and sharp objects out of patient's reach.    Considering above, patient may need higher level of care due to worsening dementia. Patient behaviors are consistent with a person with dementia and other memory loss.  Patient not recommended for psychiatric inpatient admission but for a memory care facility.  Discussed memory care as an appropriate option as they are well equipped to help elderly persons with dementia, maintain cognitive skills and help with quality of life.    Patient is psychiatrically cleared. TOC consult has already been entered by the ED provider. Do recommend restarting her home medications.   Disposition: Patient does not meet criteria for psychiatric inpatient admission. Supportive therapy provided about ongoing stressors. Discussed crisis plan, support from social network, calling 911, coming to the Emergency Department, and calling Suicide Hotline.  This service was provided via telemedicine using a 2-way, interactive audio and video technology.  Spoke with Dr. Alona Bene, Mertha Baars, PA-C, Susa Simmonds, SW were all informed of above recommendation and disposition via secure chat at 1628.    Names of all persons participating in this telemedicine service and their role in this encounter. Name: Dominique Evans Role: Patient  Name: Benn Moulder  Role: Pt's Son  Name: Ophelia Shoulder Role: PMHNP  Name: Nelly Rout Role: Psychiatrist    Chales Abrahams, NP 06/02/2023 12:26 AM

## 2023-06-01 NOTE — Consult Note (Signed)
Pt seen and psych cleared.  Progress note with additional details pending.

## 2023-06-01 NOTE — ED Notes (Signed)
Sitter at bedside, door open, denies having any needs.

## 2023-06-01 NOTE — Progress Notes (Signed)
RE: Dominique Evans  Date of Birth: [birth year]  Date: 06/01/2023  To Whom It May Concern:  Please be advised that the above-named patient will require a short-term nursing home stay - anticipated 30 days or less for rehabilitation and strengthening. The plan is for return home.

## 2023-06-01 NOTE — Consult Note (Signed)
Psychiatry is attempting to see this patient for assessment via tts cart.  Message sent to Patrick North, RN via secure chat.  Also attempted to call unit at [phone], unfortunately there was no answer.  Psychiatry will continue to reach out to care team to coordinate seeing patient.

## 2023-06-01 NOTE — Progress Notes (Signed)
Per Ophelia Shoulder, NP pt has been psych cleared. TOC is already involved in pt's care. This CSW will now remove pt from the [facility] shift report. TOC to assist with any identified discharge needs.   Maryjean Ka, MSW, [facility] 06/01/2023 10:35 PM

## 2023-06-01 NOTE — ED Notes (Signed)
Moved pt to room 39.

## 2023-06-01 NOTE — [place] FL2 (Signed)
[place] MEDICAID FL2 LEVEL OF CARE FORM     IDENTIFICATION  Patient Name: Dominique Evans Birthdate: 07-Apr-1966 Sex: female Admission Date (Current Location): 06/01/2023  [facility] and [place] Number:  Producer, television/film/video and Address:  [facility], [address] [place], [place] [address]      Provider Number: [id number]  Attending Physician Name and Address:  Maia Plan, MD  Relative Name and Phone Number:  Lanyiah, [place] [phone])  [phone]    Current Level of Care: Hospital Recommended Level of Care: Skilled Nursing Facility Prior Approval Number:    Date Approved/Denied:   PASRR Number: PENDING  Discharge Plan:      Current Diagnoses: Patient Active Problem List   Diagnosis Date Noted   Dementia (HCC) 06/01/2023   Cellulitis of ear, right 02/15/2020   DM (diabetes mellitus), type 2 (HCC) 01/24/2020   SIRS (systemic inflammatory response syndrome) (HCC) 01/24/2020   Facial cellulitis 01/24/2020   Mild neurocognitive disorder, unclear etiology 10/25/2019   Pain due to onychomycosis of toenails of both feet 09/22/2019   Diabetic neuropathy (HCC) 09/22/2019   Pre-ulcerative corn or callous 09/22/2019   Esophageal dysphagia 06/10/2019   Intractable nausea and vomiting 06/09/2019   Cellulitis of left foot 04/27/2019   Hyperlipidemia 02/16/2019   Chronic pain 09/24/2018   Incisional hernia 09/24/2018   Low back pain 09/24/2018   Major depressive disorder with single episode 09/24/2018   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   Esophageal dysmotility 03/01/2015   Lateral ventral hernia 03/01/2015   S/P lumbar spinal fusion 05/23/2014   Edema 06/28/2013   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   HNP (herniated nucleus pulposus), lumbar 11/30/2012   Thoracic aortic aneurysm 10/23/2010   Constipation, chronic 03/23/2010   Obesity, unspecified 10/31/2007   Obstructive sleep apnea 10/31/2007   Chronic rhinitis 10/31/2007   Cough variant  asthma 10/31/2007   GERD (gastroesophageal reflux disease) 10/31/2007   Type 2 diabetes mellitus with foot ulcer (HCC) 10/04/2007   Hypertension associated with diabetes (HCC) 10/04/2007   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/04/2007    Orientation RESPIRATION BLADDER Height & Weight     Self  Normal Incontinent Weight: 206 lbs  Height:   5'5  BEHAVIORAL SYMPTOMS/MOOD NEUROLOGICAL BOWEL NUTRITION STATUS      Incontinent Diet (NPO)  AMBULATORY STATUS COMMUNICATION OF NEEDS Skin   Limited Assist Verbally Normal                       Personal Care Assistance Level of Assistance  Bathing, Feeding, Dressing Bathing Assistance: Limited assistance Feeding assistance: Independent Dressing Assistance: Limited assistance     Functional Limitations Info  Sight, Hearing, Speech Sight Info: Adequate Hearing Info: Adequate Speech Info: Adequate    SPECIAL CARE FACTORS FREQUENCY                       Contractures Contractures Info: Not present    Additional Factors Info  Code Status, Allergies Code Status Info: Full Allergies Info: Penicillins, Sulfonamide Derivatives , Clindamycin/lincomycin, Sulfa Antibiotics, Ciprofloxacin           Current Medications (06/01/2023):  This is the current hospital active medication list No current facility-administered medications for this encounter.   Current Outpatient Medications  Medication Sig Dispense Refill   acetaminophen (TYLENOL) 500 MG tablet Take 500 mg by mouth in the morning and at bedtime.     amitriptyline (ELAVIL) 25 MG  tablet Take 50 mg by mouth at bedtime.      Calcium Carb-Cholecalciferol (CALCIUM 500+D3) 500-400 MG-UNIT TABS Take 1 tablet by mouth daily.     desvenlafaxine (PRISTIQ) 50 MG 24 hr tablet Take 50 mg by mouth daily.     ferrous sulfate 325 (65 FE) MG tablet Take 325 mg by mouth daily with breakfast.     fexofenadine (ALLEGRA) 180 MG tablet Take 180 mg by mouth daily.      Fluticasone-Salmeterol (ADVAIR DISKUS) 250-50 MCG/DOSE AEPB Inhale 1 puff into the lungs 2 (two) times daily.      furosemide (LASIX) 20 MG tablet Take 20 mg by mouth every morning.      HUMIRA PEN 40 MG/0.4ML PNKT Inject 40 mg into the skin every 14 (fourteen) days.     lansoprazole (PREVACID) 30 MG capsule TAKE 1 CAPSULE (30 MG TOTAL) BY MOUTH 2 (TWO) TIMES DAILY BEFORE A MEAL. 180 capsule 2   losartan (COZAAR) 50 MG tablet Take 50 mg by mouth daily.     montelukast (SINGULAIR) 10 MG tablet Take 10 mg by mouth at bedtime.      Multiple Vitamins-Minerals (MULTIVITAMIN PO) Take 1 tablet by mouth daily.      oxyCODONE-acetaminophen (PERCOCET/ROXICET) 5-325 MG tablet Take 1 tablet by mouth 5 (five) times daily.      polyethylene glycol powder (MIRALAX) 17 GM/SCOOP powder Take 17 g by mouth daily.      potassium citrate (UROCIT-K) 10 MEQ (1080 MG) SR tablet Take 10 mEq by mouth 3 (three) times daily with meals.      pregabalin (LYRICA) 100 MG capsule Take 100 mg by mouth 2 (two) times daily.     rosuvastatin (CRESTOR) 10 MG tablet Take 5 mg by mouth every morning.      verapamil (VERELAN PM) 240 MG 24 hr capsule Take 240 mg by mouth daily.       Discharge Medications: Please see discharge summary for a list of discharge medications.  Relevant Imaging Results:  Relevant Lab Results:   Additional Information SSN# [id number]  Susa Simmonds, LCSWA

## 2023-06-01 NOTE — Discharge Instructions (Signed)
Private Pay Resources  [facility] Address: [address], [place], [place] [address] Phone: [phone]  [facility] Address: [address], [place], [place] [address] Phone: [phone]  [facility] Address: [address], [place], [place] [address] Phone: [phone]  [facility] Address: [address], , [place] [address] Phone: [phone]  [facility] Address: [address], [place], [place] [address] Phone: [phone]  [facility] Phone: [phone]  [facility] Address:  [address], [place], [place] [address] Phone:  [phone]  [facility] Address:  [address] Phone:  [phone]  [email or web address]  [facility] Phone: [phone]   

## 2023-06-02 DIAGNOSIS — F0390 Unspecified dementia without behavioral disturbance: Secondary | ICD-10-CM

## 2023-06-02 DIAGNOSIS — R45851 Suicidal ideations: Secondary | ICD-10-CM

## 2023-06-02 DIAGNOSIS — F03C18 Unspecified dementia, severe, with other behavioral disturbance: Secondary | ICD-10-CM | POA: Diagnosis not present

## 2023-06-02 NOTE — Progress Notes (Signed)
CSW spoke with Whitney in admissions at [facility]. Alphonzo Lemmings stated she received the verification from social security for patient. Alphonzo Lemmings stated she will review her benefits again.

## 2023-06-02 NOTE — Progress Notes (Signed)
CSW hasn't received a response back from A[place] in admission at [facility]. CSW contacted Kristal in admissions at [facility] to see if they can look at her benefits and if they could take patient medicaid pending.

## 2023-06-02 NOTE — ED Notes (Signed)
Pt resting in bed, NAD noted. Safety sitter at bedside.

## 2023-06-02 NOTE — Progress Notes (Signed)
CSW spoke with Marlene Lard [phone], patients medicaid worker in [place]. CSW was told patient doesn't have active long term care medicaid and the application was denied on May 30, 2023. CSW was told patient is over the income limit and she sent two notices to patients sons to complete the bank addendum. CSW was told patient will also need an FL2 and be placed in a nursing facility to switch over to long term care. Ms. Candy Sledge stated they are many layers to the application and not all the steps were completed. CSW was told that patients sons will have to re-apply and reduce patients reserve. CSW was told that patient has been deemed disabled.     CSW contacted patients son Samuel Bouche to inform him of the updated information. CSW told Samuel Bouche that due to his mother being denied for long term medicaid that patient will have to discharge home until they can re-apply and complete all steps of the medicaid application. CSW provided College with private duty home aide agencies. CSW reached back out to Tim with [facility] who will reach back out to [place] to see if he is still interested in hiring someone to come into the home and assist his mother while his brother is at work.  Samuel Bouche told CSW to contact his brother Burna Sis [phone] to come pick-up his mother. CSW spoke with Saif who stated he could be at the hospital by 7:00 PM today.

## 2023-06-02 NOTE — ED Notes (Signed)
Pt ambulatory to restroom with steady gait. Safety sitter at bedside.

## 2023-06-02 NOTE — ED Notes (Signed)
Pt ambulatory to restroom with steady gait, sitter at bedside. Pt requesting son bring items from home such as clothing and tablet. Pt back in room, NAD.

## 2023-06-02 NOTE — ED Notes (Signed)
Pt son at bedside to pick pt up to take home, son given [place] paperwork and [place] papers.

## 2023-06-02 NOTE — Progress Notes (Signed)
CSW received a call back from [place] in admissions at [facility]. Adella Nissen stated she spoke with her business office and she is reporting the same as other facilities that patient will not qualify for long term care medicaid with her current medicaid plan. Patients sons will still need to re-apply for long term medicaid. CSW gave patients sons home aide agency resources so patient is not left home alone while her son Burna Sis goes to work.
